# Patient Record
Sex: Female | Born: 1935
Health system: Southern US, Community
[De-identification: ages and names within clinical notes are randomized; demographics above are authoritative.]

## PROBLEM LIST (undated history)

## (undated) DIAGNOSIS — I480 Paroxysmal atrial fibrillation: Secondary | ICD-10-CM

## (undated) DIAGNOSIS — E876 Hypokalemia: Secondary | ICD-10-CM

## (undated) DIAGNOSIS — Z5111 Encounter for antineoplastic chemotherapy: Secondary | ICD-10-CM

## (undated) DIAGNOSIS — I119 Hypertensive heart disease without heart failure: Secondary | ICD-10-CM

## (undated) DIAGNOSIS — Z7189 Other specified counseling: Secondary | ICD-10-CM

## (undated) DIAGNOSIS — M1990 Unspecified osteoarthritis, unspecified site: Secondary | ICD-10-CM

## (undated) DIAGNOSIS — Z950 Presence of cardiac pacemaker: Secondary | ICD-10-CM

## (undated) DIAGNOSIS — Z923 Personal history of irradiation: Secondary | ICD-10-CM

## (undated) DIAGNOSIS — E039 Hypothyroidism, unspecified: Secondary | ICD-10-CM

## (undated) DIAGNOSIS — C3491 Malignant neoplasm of unspecified part of right bronchus or lung: Secondary | ICD-10-CM

## (undated) DIAGNOSIS — E860 Dehydration: Secondary | ICD-10-CM

## (undated) DIAGNOSIS — I441 Atrioventricular block, second degree: Secondary | ICD-10-CM

## (undated) DIAGNOSIS — I2510 Atherosclerotic heart disease of native coronary artery without angina pectoris: Secondary | ICD-10-CM

## (undated) DIAGNOSIS — Z853 Personal history of malignant neoplasm of breast: Secondary | ICD-10-CM

## (undated) DIAGNOSIS — I700 Atherosclerosis of aorta: Secondary | ICD-10-CM

## (undated) DIAGNOSIS — J449 Chronic obstructive pulmonary disease, unspecified: Secondary | ICD-10-CM

## (undated) DIAGNOSIS — I5032 Chronic diastolic (congestive) heart failure: Secondary | ICD-10-CM

## (undated) DIAGNOSIS — Z9221 Personal history of antineoplastic chemotherapy: Secondary | ICD-10-CM

## (undated) DIAGNOSIS — I340 Nonrheumatic mitral (valve) insufficiency: Secondary | ICD-10-CM

## (undated) HISTORY — PX: BREAST LUMPECTOMY: SHX2

## (undated) HISTORY — DX: Paroxysmal atrial fibrillation: I48.0

## (undated) HISTORY — PX: CARPAL TUNNEL RELEASE: SHX101

## (undated) HISTORY — DX: Hypokalemia: E87.6

## (undated) HISTORY — PX: TONSILLECTOMY AND ADENOIDECTOMY: SUR1326

## (undated) HISTORY — DX: Nonrheumatic mitral (valve) insufficiency: I34.0

## (undated) HISTORY — DX: Encounter for antineoplastic chemotherapy: Z51.11

## (undated) HISTORY — DX: Atherosclerotic heart disease of native coronary artery without angina pectoris: I25.10

## (undated) HISTORY — DX: Hypertensive heart disease without heart failure: I11.9

## (undated) HISTORY — PX: CHOLECYSTECTOMY: SHX55

## (undated) HISTORY — DX: Malignant neoplasm of unspecified part of right bronchus or lung: C34.91

## (undated) HISTORY — PX: ABDOMINAL HYSTERECTOMY: SHX81

## (undated) HISTORY — PX: FINGER SURGERY: SHX640

## (undated) HISTORY — DX: Dehydration: E86.0

## (undated) HISTORY — DX: Atherosclerosis of aorta: I70.0

## (undated) HISTORY — PX: SHOULDER SURGERY: SHX246

## (undated) HISTORY — DX: Other specified counseling: Z71.89

## (undated) HISTORY — DX: Chronic diastolic (congestive) heart failure: I50.32

## (undated) HISTORY — PX: BLADDER SUSPENSION: SHX72

---

## 1998-08-28 ENCOUNTER — Other Ambulatory Visit: Admission: RE | Admit: 1998-08-28 | Discharge: 1998-08-28 | Payer: Self-pay | Admitting: Obstetrics and Gynecology

## 1999-09-07 ENCOUNTER — Other Ambulatory Visit: Admission: RE | Admit: 1999-09-07 | Discharge: 1999-09-07 | Payer: Self-pay | Admitting: Obstetrics and Gynecology

## 2000-08-18 ENCOUNTER — Encounter: Admission: RE | Admit: 2000-08-18 | Discharge: 2000-08-18 | Payer: Self-pay | Admitting: Obstetrics and Gynecology

## 2000-08-18 ENCOUNTER — Encounter: Payer: Self-pay | Admitting: Obstetrics and Gynecology

## 2000-09-15 ENCOUNTER — Other Ambulatory Visit: Admission: RE | Admit: 2000-09-15 | Discharge: 2000-09-15 | Payer: Self-pay | Admitting: Obstetrics and Gynecology

## 2001-01-13 ENCOUNTER — Ambulatory Visit (HOSPITAL_BASED_OUTPATIENT_CLINIC_OR_DEPARTMENT_OTHER): Admission: RE | Admit: 2001-01-13 | Discharge: 2001-01-14 | Payer: Self-pay | Admitting: Orthopedic Surgery

## 2001-08-19 ENCOUNTER — Encounter: Payer: Self-pay | Admitting: Obstetrics and Gynecology

## 2001-08-19 ENCOUNTER — Encounter: Admission: RE | Admit: 2001-08-19 | Discharge: 2001-08-19 | Payer: Self-pay | Admitting: Obstetrics and Gynecology

## 2001-08-27 ENCOUNTER — Encounter: Payer: Self-pay | Admitting: Obstetrics and Gynecology

## 2001-08-27 ENCOUNTER — Encounter: Admission: RE | Admit: 2001-08-27 | Discharge: 2001-08-27 | Payer: Self-pay | Admitting: Internal Medicine

## 2001-09-07 ENCOUNTER — Encounter: Admission: RE | Admit: 2001-09-07 | Discharge: 2001-09-07 | Payer: Self-pay | Admitting: *Deleted

## 2001-09-07 ENCOUNTER — Encounter: Payer: Self-pay | Admitting: *Deleted

## 2001-09-09 ENCOUNTER — Encounter (INDEPENDENT_AMBULATORY_CARE_PROVIDER_SITE_OTHER): Payer: Self-pay | Admitting: Specialist

## 2001-09-09 ENCOUNTER — Ambulatory Visit (HOSPITAL_BASED_OUTPATIENT_CLINIC_OR_DEPARTMENT_OTHER): Admission: RE | Admit: 2001-09-09 | Discharge: 2001-09-09 | Payer: Self-pay | Admitting: *Deleted

## 2001-09-09 ENCOUNTER — Encounter: Admission: RE | Admit: 2001-09-09 | Discharge: 2001-09-09 | Payer: Self-pay | Admitting: Obstetrics and Gynecology

## 2001-09-09 ENCOUNTER — Encounter: Payer: Self-pay | Admitting: *Deleted

## 2001-09-16 ENCOUNTER — Other Ambulatory Visit: Admission: RE | Admit: 2001-09-16 | Discharge: 2001-09-16 | Payer: Self-pay | Admitting: Obstetrics and Gynecology

## 2001-09-18 ENCOUNTER — Ambulatory Visit: Admission: RE | Admit: 2001-09-18 | Discharge: 2001-12-17 | Payer: Self-pay | Admitting: Radiation Oncology

## 2001-10-13 ENCOUNTER — Encounter: Payer: Self-pay | Admitting: *Deleted

## 2001-10-13 ENCOUNTER — Ambulatory Visit (HOSPITAL_BASED_OUTPATIENT_CLINIC_OR_DEPARTMENT_OTHER): Admission: RE | Admit: 2001-10-13 | Discharge: 2001-10-13 | Payer: Self-pay | Admitting: *Deleted

## 2001-10-27 ENCOUNTER — Ambulatory Visit (HOSPITAL_COMMUNITY): Admission: RE | Admit: 2001-10-27 | Discharge: 2001-10-27 | Payer: Self-pay | Admitting: Oncology

## 2001-11-04 ENCOUNTER — Encounter (HOSPITAL_COMMUNITY): Payer: Self-pay | Admitting: Oncology

## 2001-11-04 ENCOUNTER — Ambulatory Visit (HOSPITAL_COMMUNITY): Admission: RE | Admit: 2001-11-04 | Discharge: 2001-11-04 | Payer: Self-pay | Admitting: Oncology

## 2002-02-15 ENCOUNTER — Ambulatory Visit: Admission: RE | Admit: 2002-02-15 | Discharge: 2002-05-16 | Payer: Self-pay | Admitting: Radiation Oncology

## 2002-04-26 ENCOUNTER — Ambulatory Visit (HOSPITAL_BASED_OUTPATIENT_CLINIC_OR_DEPARTMENT_OTHER): Admission: RE | Admit: 2002-04-26 | Discharge: 2002-04-26 | Payer: Self-pay | Admitting: *Deleted

## 2002-04-30 ENCOUNTER — Encounter (HOSPITAL_COMMUNITY): Payer: Self-pay | Admitting: Oncology

## 2002-04-30 ENCOUNTER — Encounter: Admission: RE | Admit: 2002-04-30 | Discharge: 2002-04-30 | Payer: Self-pay | Admitting: Oncology

## 2002-09-07 ENCOUNTER — Ambulatory Visit (HOSPITAL_COMMUNITY): Admission: RE | Admit: 2002-09-07 | Discharge: 2002-09-07 | Payer: Self-pay | Admitting: Oncology

## 2002-09-07 ENCOUNTER — Encounter (HOSPITAL_COMMUNITY): Payer: Self-pay | Admitting: Oncology

## 2002-09-13 ENCOUNTER — Encounter: Payer: Self-pay | Admitting: *Deleted

## 2002-09-13 ENCOUNTER — Encounter: Admission: RE | Admit: 2002-09-13 | Discharge: 2002-09-13 | Payer: Self-pay | Admitting: *Deleted

## 2002-10-19 ENCOUNTER — Ambulatory Visit (HOSPITAL_BASED_OUTPATIENT_CLINIC_OR_DEPARTMENT_OTHER): Admission: RE | Admit: 2002-10-19 | Discharge: 2002-10-19 | Payer: Self-pay | Admitting: Orthopedic Surgery

## 2002-12-21 ENCOUNTER — Ambulatory Visit (HOSPITAL_BASED_OUTPATIENT_CLINIC_OR_DEPARTMENT_OTHER): Admission: RE | Admit: 2002-12-21 | Discharge: 2002-12-21 | Payer: Self-pay | Admitting: Orthopedic Surgery

## 2003-03-22 ENCOUNTER — Encounter (INDEPENDENT_AMBULATORY_CARE_PROVIDER_SITE_OTHER): Payer: Self-pay | Admitting: *Deleted

## 2003-03-22 ENCOUNTER — Ambulatory Visit (HOSPITAL_COMMUNITY): Admission: RE | Admit: 2003-03-22 | Discharge: 2003-03-22 | Payer: Self-pay | Admitting: Gastroenterology

## 2003-08-09 ENCOUNTER — Encounter (HOSPITAL_COMMUNITY): Payer: Self-pay | Admitting: Oncology

## 2003-09-15 ENCOUNTER — Encounter: Admission: RE | Admit: 2003-09-15 | Discharge: 2003-09-15 | Payer: Self-pay | Admitting: Oncology

## 2003-09-15 ENCOUNTER — Encounter (HOSPITAL_COMMUNITY): Payer: Self-pay | Admitting: Oncology

## 2004-02-13 ENCOUNTER — Encounter: Admission: RE | Admit: 2004-02-13 | Discharge: 2004-02-13 | Payer: Self-pay | Admitting: Oncology

## 2004-05-01 ENCOUNTER — Ambulatory Visit (HOSPITAL_COMMUNITY): Admission: RE | Admit: 2004-05-01 | Discharge: 2004-05-01 | Payer: Self-pay | Admitting: Oncology

## 2004-05-27 ENCOUNTER — Ambulatory Visit (HOSPITAL_COMMUNITY): Admission: RE | Admit: 2004-05-27 | Discharge: 2004-05-27 | Payer: Self-pay | Admitting: Oncology

## 2004-07-02 ENCOUNTER — Ambulatory Visit (HOSPITAL_COMMUNITY): Admission: RE | Admit: 2004-07-02 | Discharge: 2004-07-02 | Payer: Self-pay | Admitting: Oncology

## 2004-09-17 ENCOUNTER — Encounter: Admission: RE | Admit: 2004-09-17 | Discharge: 2004-09-17 | Payer: Self-pay | Admitting: Obstetrics and Gynecology

## 2005-01-28 ENCOUNTER — Ambulatory Visit: Payer: Self-pay | Admitting: Oncology

## 2005-05-15 ENCOUNTER — Ambulatory Visit (HOSPITAL_COMMUNITY): Admission: RE | Admit: 2005-05-15 | Discharge: 2005-05-15 | Payer: Self-pay | Admitting: Oncology

## 2005-05-24 ENCOUNTER — Ambulatory Visit: Payer: Self-pay | Admitting: Oncology

## 2005-05-28 ENCOUNTER — Ambulatory Visit (HOSPITAL_COMMUNITY): Admission: RE | Admit: 2005-05-28 | Discharge: 2005-05-28 | Payer: Self-pay | Admitting: Oncology

## 2005-09-12 ENCOUNTER — Ambulatory Visit: Payer: Self-pay | Admitting: Oncology

## 2005-09-18 ENCOUNTER — Encounter: Admission: RE | Admit: 2005-09-18 | Discharge: 2005-09-18 | Payer: Self-pay | Admitting: Obstetrics and Gynecology

## 2006-01-09 ENCOUNTER — Ambulatory Visit: Payer: Self-pay | Admitting: Oncology

## 2006-05-13 ENCOUNTER — Ambulatory Visit: Payer: Self-pay | Admitting: Oncology

## 2006-05-15 LAB — COMPREHENSIVE METABOLIC PANEL
Alkaline Phosphatase: 77 U/L (ref 39–117)
BUN: 15 mg/dL (ref 6–23)
Creatinine, Ser: 0.9 mg/dL (ref 0.40–1.20)
Glucose, Bld: 126 mg/dL — ABNORMAL HIGH (ref 70–99)
Total Bilirubin: 0.4 mg/dL (ref 0.3–1.2)

## 2006-05-15 LAB — CBC WITH DIFFERENTIAL/PLATELET
Basophils Absolute: 0.1 10*3/uL (ref 0.0–0.1)
Eosinophils Absolute: 0.2 10*3/uL (ref 0.0–0.5)
HCT: 40.6 % (ref 34.8–46.6)
HGB: 14.3 g/dL (ref 11.6–15.9)
LYMPH%: 31.3 % (ref 14.0–48.0)
MCV: 93.4 fL (ref 81.0–101.0)
MONO%: 10.1 % (ref 0.0–13.0)
NEUT#: 3.4 10*3/uL (ref 1.5–6.5)
NEUT%: 54.1 % (ref 39.6–76.8)
Platelets: 197 10*3/uL (ref 145–400)
RBC: 4.35 10*6/uL (ref 3.70–5.32)

## 2006-05-16 ENCOUNTER — Ambulatory Visit (HOSPITAL_COMMUNITY): Admission: RE | Admit: 2006-05-16 | Discharge: 2006-05-16 | Payer: Self-pay | Admitting: Oncology

## 2006-05-21 ENCOUNTER — Encounter: Admission: RE | Admit: 2006-05-21 | Discharge: 2006-05-21 | Payer: Self-pay | Admitting: Oncology

## 2006-09-09 ENCOUNTER — Ambulatory Visit: Payer: Self-pay | Admitting: Oncology

## 2006-09-22 ENCOUNTER — Encounter: Admission: RE | Admit: 2006-09-22 | Discharge: 2006-09-22 | Payer: Self-pay | Admitting: Obstetrics and Gynecology

## 2007-01-12 ENCOUNTER — Ambulatory Visit: Payer: Self-pay | Admitting: Oncology

## 2007-01-15 LAB — CBC WITH DIFFERENTIAL/PLATELET
BASO%: 0.4 % (ref 0.0–2.0)
EOS%: 2.9 % (ref 0.0–7.0)
HCT: 38 % (ref 34.8–46.6)
LYMPH%: 23.8 % (ref 14.0–48.0)
MCH: 33 pg (ref 26.0–34.0)
MCHC: 35.5 g/dL (ref 32.0–36.0)
MCV: 92.8 fL (ref 81.0–101.0)
MONO%: 9.7 % (ref 0.0–13.0)
NEUT%: 63.2 % (ref 39.6–76.8)
Platelets: 158 10*3/uL (ref 145–400)
RBC: 4.1 10*6/uL (ref 3.70–5.32)
WBC: 7.8 10*3/uL (ref 3.9–10.0)

## 2007-01-15 LAB — COMPREHENSIVE METABOLIC PANEL
ALT: 12 U/L (ref 0–35)
Alkaline Phosphatase: 82 U/L (ref 39–117)
CO2: 26 mEq/L (ref 19–32)
Creatinine, Ser: 0.91 mg/dL (ref 0.40–1.20)
Sodium: 140 mEq/L (ref 135–145)
Total Bilirubin: 0.7 mg/dL (ref 0.3–1.2)
Total Protein: 6.9 g/dL (ref 6.0–8.3)

## 2007-01-15 LAB — LACTATE DEHYDROGENASE: LDH: 154 U/L (ref 94–250)

## 2007-01-29 LAB — BASIC METABOLIC PANEL
CO2: 27 mEq/L (ref 19–32)
Calcium: 9.8 mg/dL (ref 8.4–10.5)
Chloride: 103 mEq/L (ref 96–112)
Creatinine, Ser: 0.99 mg/dL (ref 0.40–1.20)
Glucose, Bld: 99 mg/dL (ref 70–99)

## 2007-05-12 ENCOUNTER — Ambulatory Visit: Payer: Self-pay | Admitting: Oncology

## 2007-05-14 ENCOUNTER — Ambulatory Visit (HOSPITAL_COMMUNITY): Admission: RE | Admit: 2007-05-14 | Discharge: 2007-05-14 | Payer: Self-pay | Admitting: Oncology

## 2007-05-14 LAB — COMPREHENSIVE METABOLIC PANEL
ALT: 15 U/L (ref 0–35)
AST: 14 U/L (ref 0–37)
Albumin: 4.2 g/dL (ref 3.5–5.2)
Alkaline Phosphatase: 83 U/L (ref 39–117)
Glucose, Bld: 168 mg/dL — ABNORMAL HIGH (ref 70–99)
Potassium: 3.8 mEq/L (ref 3.5–5.3)
Sodium: 139 mEq/L (ref 135–145)
Total Bilirubin: 0.4 mg/dL (ref 0.3–1.2)
Total Protein: 7.1 g/dL (ref 6.0–8.3)

## 2007-05-14 LAB — CBC WITH DIFFERENTIAL/PLATELET
BASO%: 0.3 % (ref 0.0–2.0)
Basophils Absolute: 0 10*3/uL (ref 0.0–0.1)
EOS%: 4.3 % (ref 0.0–7.0)
HCT: 38 % (ref 34.8–46.6)
HGB: 13.7 g/dL (ref 11.6–15.9)
MCH: 33.3 pg (ref 26.0–34.0)
MCHC: 36 g/dL (ref 32.0–36.0)
MCV: 92.8 fL (ref 81.0–101.0)
MONO%: 9.3 % (ref 0.0–13.0)
NEUT%: 53 % (ref 39.6–76.8)
RDW: 14.5 % (ref 11.3–14.5)
lymph#: 1.7 10*3/uL (ref 0.9–3.3)

## 2007-09-07 ENCOUNTER — Ambulatory Visit: Payer: Self-pay | Admitting: Oncology

## 2007-09-10 LAB — COMPREHENSIVE METABOLIC PANEL
ALT: 15 U/L (ref 0–35)
Alkaline Phosphatase: 87 U/L (ref 39–117)
CO2: 25 mEq/L (ref 19–32)
Creatinine, Ser: 0.99 mg/dL (ref 0.40–1.20)
Sodium: 140 mEq/L (ref 135–145)
Total Bilirubin: 0.5 mg/dL (ref 0.3–1.2)

## 2007-09-10 LAB — CBC WITH DIFFERENTIAL/PLATELET
BASO%: 0.8 % (ref 0.0–2.0)
EOS%: 4.4 % (ref 0.0–7.0)
HCT: 39.2 % (ref 34.8–46.6)
LYMPH%: 35.6 % (ref 14.0–48.0)
MCH: 33.5 pg (ref 26.0–34.0)
MCHC: 36.2 g/dL — ABNORMAL HIGH (ref 32.0–36.0)
MCV: 92.6 fL (ref 81.0–101.0)
MONO#: 0.6 10*3/uL (ref 0.1–0.9)
MONO%: 11.3 % (ref 0.0–13.0)
NEUT%: 47.9 % (ref 39.6–76.8)
Platelets: 168 10*3/uL (ref 145–400)
RBC: 4.23 10*6/uL (ref 3.70–5.32)

## 2007-09-10 LAB — LACTATE DEHYDROGENASE: LDH: 167 U/L (ref 94–250)

## 2007-09-28 ENCOUNTER — Encounter: Admission: RE | Admit: 2007-09-28 | Discharge: 2007-09-28 | Payer: Self-pay | Admitting: Obstetrics and Gynecology

## 2007-10-01 ENCOUNTER — Encounter: Admission: RE | Admit: 2007-10-01 | Discharge: 2007-10-01 | Payer: Self-pay | Admitting: Oncology

## 2008-01-07 ENCOUNTER — Ambulatory Visit: Payer: Self-pay | Admitting: Oncology

## 2008-01-11 LAB — CBC WITH DIFFERENTIAL/PLATELET
EOS%: 2.9 % (ref 0.0–7.0)
Eosinophils Absolute: 0.2 10*3/uL (ref 0.0–0.5)
HCT: 40.4 % (ref 34.8–46.6)
HGB: 14 g/dL (ref 11.6–15.9)
MCHC: 34.7 g/dL (ref 32.0–36.0)
MONO#: 0.5 10*3/uL (ref 0.1–0.9)
MONO%: 7.8 % (ref 0.0–13.0)
NEUT#: 3.7 10*3/uL (ref 1.5–6.5)
NEUT%: 60.1 % (ref 39.6–76.8)
Platelets: 168 10*3/uL (ref 145–400)
WBC: 6.1 10*3/uL (ref 3.9–10.0)
lymph#: 1.7 10*3/uL (ref 0.9–3.3)

## 2008-01-11 LAB — COMPREHENSIVE METABOLIC PANEL
AST: 15 U/L (ref 0–37)
Albumin: 4.4 g/dL (ref 3.5–5.2)
Alkaline Phosphatase: 89 U/L (ref 39–117)
BUN: 17 mg/dL (ref 6–23)
Potassium: 4 mEq/L (ref 3.5–5.3)
Sodium: 141 mEq/L (ref 135–145)
Total Bilirubin: 0.4 mg/dL (ref 0.3–1.2)

## 2008-07-15 ENCOUNTER — Ambulatory Visit: Payer: Self-pay | Admitting: Oncology

## 2008-07-19 LAB — CBC WITH DIFFERENTIAL/PLATELET
Basophils Absolute: 0 10*3/uL (ref 0.0–0.1)
Eosinophils Absolute: 0.2 10*3/uL (ref 0.0–0.5)
HCT: 41 % (ref 34.8–46.6)
LYMPH%: 33.8 % (ref 14.0–48.0)
MCV: 96.5 fL (ref 81.0–101.0)
MONO#: 0.6 10*3/uL (ref 0.1–0.9)
MONO%: 10.8 % (ref 0.0–13.0)
NEUT#: 2.6 10*3/uL (ref 1.5–6.5)
NEUT%: 50.9 % (ref 39.6–76.8)
Platelets: 167 10*3/uL (ref 145–400)
RBC: 4.26 10*6/uL (ref 3.70–5.32)
WBC: 5.2 10*3/uL (ref 3.9–10.0)

## 2008-07-19 LAB — COMPREHENSIVE METABOLIC PANEL
Alkaline Phosphatase: 75 U/L (ref 39–117)
BUN: 19 mg/dL (ref 6–23)
CO2: 25 mEq/L (ref 19–32)
Creatinine, Ser: 0.97 mg/dL (ref 0.40–1.20)
Glucose, Bld: 124 mg/dL — ABNORMAL HIGH (ref 70–99)
Sodium: 139 mEq/L (ref 135–145)
Total Bilirubin: 0.4 mg/dL (ref 0.3–1.2)
Total Protein: 7.4 g/dL (ref 6.0–8.3)

## 2008-07-19 LAB — LACTATE DEHYDROGENASE: LDH: 155 U/L (ref 94–250)

## 2008-07-25 ENCOUNTER — Encounter: Admission: RE | Admit: 2008-07-25 | Discharge: 2008-07-25 | Payer: Self-pay | Admitting: Oncology

## 2008-07-25 ENCOUNTER — Ambulatory Visit (HOSPITAL_COMMUNITY): Admission: RE | Admit: 2008-07-25 | Discharge: 2008-07-25 | Payer: Self-pay | Admitting: Oncology

## 2008-10-03 ENCOUNTER — Encounter: Admission: RE | Admit: 2008-10-03 | Discharge: 2008-10-03 | Payer: Self-pay | Admitting: Obstetrics and Gynecology

## 2009-01-18 ENCOUNTER — Ambulatory Visit: Payer: Self-pay | Admitting: Oncology

## 2009-01-20 LAB — COMPREHENSIVE METABOLIC PANEL
AST: 11 U/L (ref 0–37)
Albumin: 4.2 g/dL (ref 3.5–5.2)
Alkaline Phosphatase: 70 U/L (ref 39–117)
BUN: 20 mg/dL (ref 6–23)
Potassium: 4.2 mEq/L (ref 3.5–5.3)
Sodium: 140 mEq/L (ref 135–145)
Total Bilirubin: 0.4 mg/dL (ref 0.3–1.2)

## 2009-01-20 LAB — CBC WITH DIFFERENTIAL/PLATELET
EOS%: 2.3 % (ref 0.0–7.0)
LYMPH%: 23.8 % (ref 14.0–48.0)
MCH: 32.9 pg (ref 26.0–34.0)
MCV: 95.6 fL (ref 81.0–101.0)
MONO%: 9.6 % (ref 0.0–13.0)
Platelets: 259 10*3/uL (ref 145–400)
RBC: 3.98 10*6/uL (ref 3.70–5.32)
RDW: 14.2 % (ref 11.3–14.5)

## 2009-01-30 ENCOUNTER — Ambulatory Visit (HOSPITAL_COMMUNITY): Admission: RE | Admit: 2009-01-30 | Discharge: 2009-01-30 | Payer: Self-pay | Admitting: Oncology

## 2009-02-03 ENCOUNTER — Ambulatory Visit (HOSPITAL_COMMUNITY): Admission: RE | Admit: 2009-02-03 | Discharge: 2009-02-03 | Payer: Self-pay | Admitting: Oncology

## 2009-08-08 ENCOUNTER — Ambulatory Visit: Payer: Self-pay | Admitting: Oncology

## 2009-08-10 LAB — LACTATE DEHYDROGENASE: LDH: 152 U/L (ref 94–250)

## 2009-08-10 LAB — COMPREHENSIVE METABOLIC PANEL
ALT: 20 U/L (ref 0–35)
AST: 14 U/L (ref 0–37)
Albumin: 4.2 g/dL (ref 3.5–5.2)
Alkaline Phosphatase: 58 U/L (ref 39–117)
Potassium: 3.9 mEq/L (ref 3.5–5.3)
Sodium: 139 mEq/L (ref 135–145)
Total Protein: 6.9 g/dL (ref 6.0–8.3)

## 2009-08-10 LAB — CBC WITH DIFFERENTIAL/PLATELET
BASO%: 0.6 % (ref 0.0–2.0)
Basophils Absolute: 0 10*3/uL (ref 0.0–0.1)
EOS%: 4.5 % (ref 0.0–7.0)
HGB: 12.8 g/dL (ref 11.6–15.9)
MCH: 32.1 pg (ref 25.1–34.0)
MCHC: 34.3 g/dL (ref 31.5–36.0)
RBC: 4 10*6/uL (ref 3.70–5.45)
RDW: 14 % (ref 11.2–14.5)
lymph#: 1.7 10*3/uL (ref 0.9–3.3)

## 2009-10-04 ENCOUNTER — Encounter: Admission: RE | Admit: 2009-10-04 | Discharge: 2009-10-04 | Payer: Self-pay | Admitting: Obstetrics and Gynecology

## 2010-02-05 ENCOUNTER — Ambulatory Visit: Payer: Self-pay | Admitting: Oncology

## 2010-02-08 LAB — CBC WITH DIFFERENTIAL/PLATELET
Eosinophils Absolute: 0.1 10*3/uL (ref 0.0–0.5)
HCT: 39.3 % (ref 34.8–46.6)
LYMPH%: 29.1 % (ref 14.0–49.7)
MCHC: 34.9 g/dL (ref 31.5–36.0)
MONO#: 0.5 10*3/uL (ref 0.1–0.9)
NEUT#: 3.5 10*3/uL (ref 1.5–6.5)
NEUT%: 59.4 % (ref 38.4–76.8)
Platelets: 193 10*3/uL (ref 145–400)
WBC: 5.9 10*3/uL (ref 3.9–10.3)

## 2010-02-08 LAB — COMPREHENSIVE METABOLIC PANEL
CO2: 17 mEq/L — ABNORMAL LOW (ref 19–32)
Creatinine, Ser: 0.99 mg/dL (ref 0.40–1.20)
Glucose, Bld: 125 mg/dL — ABNORMAL HIGH (ref 70–99)
Total Bilirubin: 0.4 mg/dL (ref 0.3–1.2)

## 2010-02-08 LAB — LACTATE DEHYDROGENASE: LDH: 162 U/L (ref 94–250)

## 2010-02-26 ENCOUNTER — Ambulatory Visit (HOSPITAL_COMMUNITY): Admission: RE | Admit: 2010-02-26 | Discharge: 2010-02-26 | Payer: Self-pay | Admitting: Oncology

## 2010-07-26 ENCOUNTER — Encounter: Admission: RE | Admit: 2010-07-26 | Discharge: 2010-07-26 | Payer: Self-pay | Admitting: Internal Medicine

## 2010-08-02 ENCOUNTER — Ambulatory Visit: Payer: Self-pay | Admitting: Oncology

## 2010-08-06 LAB — COMPREHENSIVE METABOLIC PANEL
ALT: 16 U/L (ref 0–35)
Alkaline Phosphatase: 60 U/L (ref 39–117)
CO2: 25 mEq/L (ref 19–32)
Creatinine, Ser: 1.03 mg/dL (ref 0.40–1.20)
Total Bilirubin: 0.5 mg/dL (ref 0.3–1.2)

## 2010-08-06 LAB — CBC WITH DIFFERENTIAL/PLATELET
EOS%: 3.6 % (ref 0.0–7.0)
Eosinophils Absolute: 0.2 10*3/uL (ref 0.0–0.5)
LYMPH%: 27.2 % (ref 14.0–49.7)
MCH: 33.2 pg (ref 25.1–34.0)
MCHC: 34.2 g/dL (ref 31.5–36.0)
MCV: 97.2 fL (ref 79.5–101.0)
MONO%: 9.1 % (ref 0.0–14.0)
NEUT#: 3.4 10*3/uL (ref 1.5–6.5)
Platelets: 194 10*3/uL (ref 145–400)
RBC: 4.29 10*6/uL (ref 3.70–5.45)

## 2010-08-06 LAB — LACTATE DEHYDROGENASE: LDH: 168 U/L (ref 94–250)

## 2010-09-03 ENCOUNTER — Ambulatory Visit: Payer: Self-pay | Admitting: Oncology

## 2010-10-05 ENCOUNTER — Encounter: Admission: RE | Admit: 2010-10-05 | Discharge: 2010-10-05 | Payer: Self-pay | Admitting: Oncology

## 2011-02-05 ENCOUNTER — Other Ambulatory Visit (HOSPITAL_COMMUNITY): Payer: Self-pay | Admitting: Oncology

## 2011-02-05 ENCOUNTER — Ambulatory Visit (HOSPITAL_COMMUNITY)
Admission: RE | Admit: 2011-02-05 | Discharge: 2011-02-05 | Disposition: A | Discharge status: Home / Self Care | Payer: Medicare Other | Source: Ambulatory Visit | Attending: Oncology | Admitting: Oncology

## 2011-02-05 ENCOUNTER — Encounter (HOSPITAL_BASED_OUTPATIENT_CLINIC_OR_DEPARTMENT_OTHER): Payer: Medicare Other | Admitting: Oncology

## 2011-02-05 DIAGNOSIS — C50319 Malignant neoplasm of lower-inner quadrant of unspecified female breast: Secondary | ICD-10-CM

## 2011-02-05 DIAGNOSIS — Z09 Encounter for follow-up examination after completed treatment for conditions other than malignant neoplasm: Secondary | ICD-10-CM | POA: Insufficient documentation

## 2011-02-05 DIAGNOSIS — R221 Localized swelling, mass and lump, neck: Secondary | ICD-10-CM

## 2011-02-05 DIAGNOSIS — R220 Localized swelling, mass and lump, head: Secondary | ICD-10-CM

## 2011-02-05 DIAGNOSIS — C50919 Malignant neoplasm of unspecified site of unspecified female breast: Secondary | ICD-10-CM

## 2011-02-05 DIAGNOSIS — Z780 Asymptomatic menopausal state: Secondary | ICD-10-CM

## 2011-02-05 DIAGNOSIS — Z853 Personal history of malignant neoplasm of breast: Secondary | ICD-10-CM

## 2011-02-05 LAB — CBC WITH DIFFERENTIAL/PLATELET
BASO%: 0.5 % (ref 0.0–2.0)
Basophils Absolute: 0 10*3/uL (ref 0.0–0.1)
HCT: 40.1 % (ref 34.8–46.6)
HGB: 13.9 g/dL (ref 11.6–15.9)
MCHC: 34.7 g/dL (ref 31.5–36.0)
MONO#: 0.6 10*3/uL (ref 0.1–0.9)
NEUT%: 53.4 % (ref 38.4–76.8)
WBC: 5.6 10*3/uL (ref 3.9–10.3)
lymph#: 1.7 10*3/uL (ref 0.9–3.3)

## 2011-02-05 LAB — COMPREHENSIVE METABOLIC PANEL
ALT: 10 U/L (ref 0–35)
CO2: 27 mEq/L (ref 19–32)
Calcium: 9.4 mg/dL (ref 8.4–10.5)
Chloride: 102 mEq/L (ref 96–112)
Creatinine, Ser: 1.05 mg/dL (ref 0.40–1.20)

## 2011-02-05 LAB — LACTATE DEHYDROGENASE: LDH: 132 U/L (ref 94–250)

## 2011-02-07 ENCOUNTER — Other Ambulatory Visit: Payer: Medicare Other

## 2011-02-13 ENCOUNTER — Ambulatory Visit
Admission: RE | Admit: 2011-02-13 | Discharge: 2011-02-13 | Disposition: A | Discharge status: Home / Self Care | Payer: Medicare Other | Source: Ambulatory Visit | Attending: Oncology | Admitting: Oncology

## 2011-02-13 DIAGNOSIS — Z780 Asymptomatic menopausal state: Secondary | ICD-10-CM

## 2011-02-13 DIAGNOSIS — Z853 Personal history of malignant neoplasm of breast: Secondary | ICD-10-CM

## 2011-04-26 NOTE — Op Note (Signed)
[place]. [facility]  Patient:    NECOLE, MINASSIAN Visit Number: [id number] MRN: [id number]          Service Type: DSU Location: [facility] Attending Physician:  Kandis Mannan Dictated by:   Donnie Coffin Samuella Cota, M.D. Proc. Date: 09/09/01 Admit Date:  09/09/2001   CC:         Malachi Pro. Ambrose Mantle, M.D.  Genene Churn. Sherin Quarry, M.D.   Operative Report  CCS [phone].  PREOPERATIVE DIAGNOSIS:  Biopsy-proven invasive ductal carcinoma, left breast.  POSTOPERATIVE DIAGNOSIS:  Biopsy-proven invasive ductal carcinoma, left breast, with positive axillary lymph node showing metastatic carcinoma.  PROCEDURES: 1. Needle localization and partial mastectomy of left breast. 2. Injection of blue dye. 3. Sentinel lymph node dissection, left axilla. 4. Left axillary lymph node dissection.  SURGEON:  Maisie Fus B. Samuella Cota, M.D.  ANESTHESIA:  General, anesthesiologist and C.R.N.A.  DESCRIPTION OF PROCEDURE:  The patient was taken to the operating room and placed on the table in supine position.  After satisfactory general anesthetic with LMA intubation, the left breast and axilla were prepped and draped in a sterile field.  Isosulfan blue 4 cc was injected just beneath the skin of the nipple and areola.  This was then massaged for five minutes.  The gamma probe was placed over the left axilla, and there was a hot spot noted.  A small transverse incision was made in the lower axilla.  Dissection was taken through skin and subcutaneous tissue.  A slightly blue, hot node was identified, dissected free with lymphatic coming to it clipped with hemoclips. After this was removed, a second hot node was identified posterior to this, which was hotter than the first node and contained more blue dye.  This was treated in a similar manner and removed.  There was a third node in the same area, which was also dissected free and removed.  The three hot, blue nodes were sent to the pathologist.   Hemostasis was obtained, and the wound was packed open while we awaited further reports.  The patient had a wire coming through the skin at about the 7 oclock position and angling back toward the nipple and areola.  A transverse elliptical incision was made in the lower breast to remove the opening in the skin made by the wire.  The patient had a rather large breast and using the wire as a guide, a large amount of breast tissue was removed.  There seemed to be some firmness close to the incision cephalad, so more tissue was removed and all the tissue I cut across looked and felt normal.  The specimen was marked with a short suture medially and a long suture laterally.  This was then sent to the radiologist to be sure that the area of concern had been removed, and they reported that it had been removed.  At about this time, the pathologist reported that malignant cells were identified in at least one of the sentinel lymph nodes.  The patient then had a full axillary lymph node dissection.  The incision was extended at the two ends and taken down to the pectoralis major muscle.  The pectoralis major and minor muscles were retracted medially and the axilla dissected.  Vessels coming off inferior to the axillary vein were doubly clipped with hemoclips and divided.  The long thoracic nerve of Bell and thoracodorsal artery and vein and nerve were seen and preserved.  The patient had a generous amount of fatty tissue in the  axilla, and some hard nodes were felt.  After the axilla had been dissected and irrigated, hemostasis was obtained.  This was obtained using either hemoclips or suture ties or plain ties of 3-0 Vicryl.  A #19 round Blake drain was placed into the left axilla through a stab wound inferior to that.  It was sutured in place using a 3-0 Vicryl suture.  Both wounds were irrigated and closed with subcuticular sutures of 3-0 Vicryl and the skin was closed with skin staples.  The Blake  drain was connected to bulb suction.  Vaseline gauze was placed over the staples of the axilla.  A large dressing using 4 x 4s, ABD, and four-inch Hypafix was applied.  The patient seemed to tolerate the procedure well and was taken to the PACU in satisfactory condition. Dictated by:   Donnie Coffin Samuella Cota, M.D. Attending Physician:  Kandis Mannan DD:  09/09/01 TD:  09/10/01 Job: [id number]

## 2011-04-26 NOTE — Op Note (Signed)
[place]. [facility]  Patient:    Maria Washington, Maria Washington Visit Number: [id number] MRN: [id number]          Service Type: DSU Location: [facility] Attending Physician:  Kandis Mannan Dictated by:   Donnie Coffin Samuella Cota, M.D. Proc. Date: 10/13/01 Admit Date:  10/13/2001   CC:         Valentino Hue. Magrinat, M.D.  Genene Churn. Sherin Quarry, M.D.  Malachi Pro. Ambrose Mantle, M.D.   Operative Report  CCS [phone].  PREOPERATIVE DIAGNOSIS:  Carcinoma of the left breast, needs intravenous access for chemotherapy.  POSTOPERATIVE DIAGNOSIS:  Carcinoma of the left breast, needs intravenous access for chemotherapy.  PROCEDURE:  Insertion of a Lifeport catheter via right subclavian vein.  SURGEON:  Maisie Fus B. Samuella Cota, M.D.  ANESTHESIA:  1% Xylocaine local with anesthesia monitoring, anesthesiologist J. Claybon Jabs, M.D., and C.R.N.A. Palmer.  DESCRIPTION OF PROCEDURE:  The patient was taken to the operating room and placed on the table in the supine position.  A rolled-up sheet was placed vertically along the thoracic spine.  The neck and chest area was prepped on the left side.  The sterile drapes were applied.  The patient was placed in Trendelenburg position and 1% Xylocaine local was used to infiltrate the skin beneath the right clavicle.  The 18-gauge needle was used to enter the right subclavian vein.  The vein was entered on the first pass of the needle.  The guidewire was placed through the needle, and fluoroscopy revealed that the guidewire was going caudally.  The needle was removed.  The guidewire was left in place.  Xylocaine 1% was used to infiltrate the skin overlying the right chest.  A small transverse incision was made and a subcutaneous pocket was created inferior to this.  The Lifeport catheter was then threaded subcutaneously from the area of the reservoir to the area of the subclavian stick.  It was filled with heparinized saline.  The vein was then dilated using the  dilator, and then the dilator and peel-away sheath were placed over the guidewire and the dilator and guidewire were pulled away, leaving the peel-away sheath.  The Lifeport catheter was then threaded through the peel-away sheath and the peel-away sheath pulled away.  Using fluoroscopy, the tube was brought back up into the area of the superior vena cava.  The system aspirated and irrigated easily.  The catheter was then attached to the Lifeport reservoir using the bayonet attachment.  The reservoir was sutured in the subcutaneous pocket using three sutures of 3-0 Vicryl.  Fluoroscopy again was used to be sure that the system looked satisfactory with no kinks.  The system still aspirated and irrigated well.  The entire system was then filled with 5 cc of heparin 100 units/cc.  The wound was closed with several interrupted sutures of 3-0 Vicryl interrupted sutures, and the skin was closed with a running subcuticular suture of 4-0 Vicryl.  A single 4-0 Vicryl suture was placed at the subcuticular site.  Benzoin and half-inch Steri-Strips were then used to reinforce the skin closure.  Dry sterile dressing was applied. The patient seemed to tolerate the procedure well and was taken to the PACU in satisfactory condition. Dictated by:   Donnie Coffin Samuella Cota, M.D. Attending Physician:  Kandis Mannan DD:  10/13/01 TD:  10/14/01 Job: [id number]

## 2011-04-26 NOTE — Op Note (Signed)
NAME:  Maria Washington, Maria Washington                          ACCOUNT NO.:  [id number]   MEDICAL RECORD NO.:  [id number]                   PATIENT TYPE:  AMB   LOCATION:  DSC                                  FACILITY:  [facility]   PHYSICIAN:  Katy Fitch. Naaman Plummer., M.D.          DATE OF BIRTH:  [birth year]   DATE OF PROCEDURE:  DATE OF DISCHARGE:                                 OPERATIVE REPORT   PREOPERATIVE DIAGNOSIS:  Chronic stenosis and tenosynovitis, right index  finger at A1 pulley with development of mask consistent with ganglion cyst  on the ulnar aspect of A1 pulley.   POSTOPERATIVE DIAGNOSIS:  Chronic stenosis and tenosynovitis, right index  finger at A1 pulley with development of mask consistent with ganglion cyst  on the ulnar aspect of A1 pulley.   OPERATION:  1. Removal of right index finger, ganglion cyst at A1 pulley.  2. Release of right index finger, A1 pulley.   OPERATING SURGEON:  Dr. Josephine Igo.   ASSISTANT:  Robert Dasnoit, P.A.-C.   ANESTHESIA:  0.25% Marcaine and 2% Lidocaine metacarpal head level block,  right index finger.   INDICATIONS FOR PROCEDURE:  Maria Washington is a 77 year old woman with a  history of stenosis and tenosynovitis and a painful mass in her right palm.  Clinical examination revealed signs of chronic stenosis and tenosynovitis.   Due to a failure to respond to non-operative measures, she is brought to the  operating room at this time for release of her right index finger A1 pulley  and excision of her ganglion.   DESCRIPTION OF PROCEDURE:  Maria Washington was brought to the operating room  and placed in the supine position on the operating room table.  Due to a  mastectomy on the left, IV access was obtained at the right antecubital  fossa.   The right arm was prepped with Betadine soap and solution and sterilely  draped.  A pneumatic tourniquet was applied to the forearm.   The area of intended incision was infiltrated with 0.25% Marcaine and  2%  Lidocaine, obtaining a digital block.  When anesthesia was satisfactory,  procedure commenced with exsanguination of the right hand with an Esmarch  bandage and inflation of a sterile tourniquet to 260 mmHg.  A short, oblique  incision was fashioned in the line of a Brunner's zig zag incision directly  over the A1 pulley.  Subcutaneous tissues were carefully divided and gently  retracted in the neurovascular bundles.  The cyst measured approximately 5  mm in diameter.  This was removed off the A1 pulley with a rongeur.   The pulley was then split with scalpel and scissors.  Thereafter, flexor  tendons were delivered, full range of motion of the fingers recovered.  There were no apparent complications.  The wound was repaired with  interrupted sutures  of 5-0 nylon followed by placement of dressing of Xeroform, sterile gauze  and Coban.  For aftercare, [name] is given a prescription for Percocet 5  mg one p.o. q.4-6h. p.r.n. pain, she has medication at home.   She is given 500 mg of Vancomycin due to an abrasion over the dorsal aspect  of her hand as prophylactic antibiotic.                                               Katy Fitch Naaman Plummer., M.D.    RVS/MEDQ  D:  12/21/2002  T:  12/21/2002  Job:  [id number]

## 2011-04-26 NOTE — Op Note (Signed)
[place]. [facility]  Patient:    Maria Washington, Maria Washington                       MRN: [id number] Proc. Date: 01/13/01 Adm. Date:  [id number] Attending:  Twana First                           Operative Report  PREOPERATIVE DIAGNOSES>: 1. Right shoulder rotator cuff tear. 2. Right shoulder impingement. 3. Right shoulder acromioclavicular joint spurring/degenerative joint disease. 4. Right shoulder labrale tear.  POSTOPERATIVE DIAGNOSES: 1. Right shoulder rotator cuff tear. 2. Right shoulder impingement. 3. Right shoulder acromioclavicular joint spurring/degenerative joint disease. 4. Right shoulder labrale tear.  OPERATION: 1. Right shoulder examination under anesthesia followed by arthroscopic    partial labrale tear debridement. 2. Right shoulder subacromial decompression. 3. Right shoulder open rotator open cuff repair. 4. Right shoulder distal clavicle excision.  SURGEON:  Elana Alm. Thurston Hole, M.D.  ASSISTANT:  Kirstin A. Shepperson, P.A.  ANESTHESIA:  General  OPERATIVE TIME:  One hour.  COMPLICATIONS:  None.  INDICATION FOR PROCEDURE:  Maria Washington is a 77 year old woman who has had a year of right shoulder pain, increasing in nature with signs and signs, an MRI documenting rotator tear cuff and impingement with AC joint arthrosis and spurring and who has failed conservative care and is now to undergo arthroscopy and rotator cuff repair.  DESCRIPTION OF PROCEDURE:  Maria Washington is brought to the operating room on January 13, 2001 placed on the operative table in the supine position.  After and adequate level of general anesthesia was obtained, her right shoulder was examined under anesthesia.  She had full range of motion.  Her shoulder was stable ligamentous exam.  After this was done, she was placed in a beach chair position and her shoulder and arm was prepped using sterile Betadine and draped using sterile technique.  Originally through a  posterior arthroscopic portal, the arthroscope with a pump attached was placed and through an anterior portal an arthroscopic probe was placed.  On initial inspection, the articular cartilage and the glenohumeral joint was intact.  Anterior labrale and 25 to 30% partial tearing which was debrided.  Pseudoperilabrum was intact.  Biceps tendon anchor was intact.  Inferior labrum and anterior inferior glenohumeral ligaments were intact.  Posterior labrum was intact. Biceps tendon was intact.  Rotator cuff showed a complete tear of the supraspinatus.  A partial tear of the infraspinatus which was debrided.  The teres minor and subscapularis tendons were intact.  A lateral arthroscopic portal was made and the subacromial space was entered.  Moderately thickened bursitis was resected.  Underneath this a rotator cuff tear was visualized. It was further debrided arthroscopically.  Subacromial decompression was carried out removing 6 to 8 mm of the undersurface of the anterior, anterior lateral, and anterior medial acromion and CA ligament release carried out. The [facility] joint was exposed.  Significant spurring and degenerative changes noted and the distal 5 to 6 mm of clavicle was resected with a 6 mm bur.  After this was done, then through the lateral portal a 3 cm deltoid splitting incision was made.  The underlying subcutaneous tissues were incised in line with the skin incision.  Deltoid muscle was split longitudinally.   The subacromial space was entered.  Rotator cuff tear was visualized and sharply debrided.  An [employer] suture anchor was placed in the greater tuberosity and  each of the #2 Ethibon sutures was passed through the rotator cuff and then tied down thus resecuring the rotator cuff back down to the greater tuberosity.  After this was done, there was found to be very firm fixation.  The shoulder could be brought through a full range of motion with no impingement on the rotator  cuff repair.  Intraoperative fluoroscopy was then obtained to confirm adequate distal clavicle excision and this was confirmed.  After this was done, the wound was irrigated and closed using 0 Vicryl to close the deltoid fascia. Subcutaneous tissue was closed with 2-0 Vicryl.  Subcuticular layer closed with 3-0 Prolene.  Steri-Strips applied.  Sterile dressings and a sling applied.  The patient awakened and taken to the recovery room in stable condition.  FOLLOW UP CARE:  Maria Washington will be followed as an overnight recovery care patient for IV pain control and neurovascular monitoring.  Discharged tomorrow on Percocet and Naprosyn.  Seen back in the office in a week for sutures out and follow up. DD:  01/13/01 TD:  01/14/01 Job: [id number]

## 2011-04-26 NOTE — Op Note (Signed)
NAME:  Maria Washington, Maria Washington                          ACCOUNT NO.:  [id number]   MEDICAL RECORD NO.:  [id number]                   PATIENT TYPE:  AMB   LOCATION:  DSC                                  FACILITY:  [facility]   PHYSICIAN:  Katy Fitch. Naaman Plummer., M.D.          DATE OF BIRTH:  1929-02-13   DATE OF PROCEDURE:  10/19/2002  DATE OF DISCHARGE:                                 OPERATIVE REPORT   PREOPERATIVE DIAGNOSIS:  Chronic stenosing tenosynovitis, left index finger  at A-1 pulley.   POSTOPERATIVE DIAGNOSIS:  Chronic stenosing tenosynovitis, left index finger  at A-1 pulley.   OPERATION PERFORMED:  Release of left index finger A-1 pulley (16109-U0)   SURGEON:  Katy Fitch. Sypher, M.D.   ASSISTANT:  Jonni Sanger, P.A.   ANESTHESIA:  2% lidocaine and 0.25% Marcaine metacarpal head level block,  left index finger supplemented by IV sedation.   SUPERVISING ANESTHESIOLOGIST:  Dr. Katrinka Blazing.   INDICATIONS FOR PROCEDURE:  The patient is a 77 year old woman with a  history of chronic stenosing tenosynovitis of the left index finger.  She  has had failure to respond to nonoperative measures.  Therefore, she is  brought to the operating room at this time for release of her left index  finger A-1 pulley.   DESCRIPTION OF PROCEDURE:  The patient was brought to the operating room and  placed in supine position upon the operating table.  The left arm was  prepped with Betadine soap and solution and sterilely draped.  Light  sedation was administered.  0.25% Marcaine and 2% lidocaine were infiltrated  at the metacarpal head level to obtain digital block.  When anesthesia was  satisfactory, the arm was exsanguinated with an Esmarch bandage and an  arterial tourniquet was inflated to 250 mmHg on the proximal brachium.  The  procedure commenced with an oblique incision paralleling the distal palmar  crease.  The subcutaneous tissues were carefully divided revealing the  palmar fascia.  The  radial proper digital nerve to the index finger was  identified, gently retracted.  The A-1 pulley was isolated and split with  scissors.  A small A-0 pulley was also released.  The flexor tendon was  delivered.  There were some changes noted from previous injection.  The  tendons were edematous but otherwise normal.   The wound was inspected for bleeding points which were electrocauterized by  bipolar current followed by repair of the skin with interrupted mattress  suture of 5-0 nylon.  There were no apparent complications.  The patient  tolerated surgery and anesthesia well. She was then transferred to the  recovery room with stable vital signs.  Katy Fitch Naaman Plummer., M.D.    RVS/MEDQ  D:  10/19/2002  T:  10/19/2002  Job:  [id number]

## 2011-08-06 ENCOUNTER — Encounter (HOSPITAL_BASED_OUTPATIENT_CLINIC_OR_DEPARTMENT_OTHER): Payer: Medicare Other | Admitting: Oncology

## 2011-08-06 ENCOUNTER — Other Ambulatory Visit (HOSPITAL_COMMUNITY): Payer: Self-pay | Admitting: Oncology

## 2011-08-06 DIAGNOSIS — C50319 Malignant neoplasm of lower-inner quadrant of unspecified female breast: Secondary | ICD-10-CM

## 2011-08-06 DIAGNOSIS — R220 Localized swelling, mass and lump, head: Secondary | ICD-10-CM

## 2011-08-06 LAB — COMPREHENSIVE METABOLIC PANEL
CO2: 27 mEq/L (ref 19–32)
Calcium: 9.7 mg/dL (ref 8.4–10.5)
Chloride: 108 mEq/L (ref 96–112)
Creatinine, Ser: 1.06 mg/dL (ref 0.50–1.10)
Glucose, Bld: 105 mg/dL — ABNORMAL HIGH (ref 70–99)
Total Bilirubin: 0.4 mg/dL (ref 0.3–1.2)

## 2011-08-06 LAB — CBC WITH DIFFERENTIAL/PLATELET
Basophils Absolute: 0 10*3/uL (ref 0.0–0.1)
Eosinophils Absolute: 0.1 10*3/uL (ref 0.0–0.5)
HCT: 40 % (ref 34.8–46.6)
HGB: 14 g/dL (ref 11.6–15.9)
LYMPH%: 26.7 % (ref 14.0–49.7)
MONO#: 0.6 10*3/uL (ref 0.1–0.9)
NEUT#: 3.5 10*3/uL (ref 1.5–6.5)
NEUT%: 60 % (ref 38.4–76.8)
Platelets: 156 10*3/uL (ref 145–400)
WBC: 5.9 10*3/uL (ref 3.9–10.3)

## 2011-08-06 LAB — LACTATE DEHYDROGENASE: LDH: 152 U/L (ref 94–250)

## 2011-09-03 ENCOUNTER — Other Ambulatory Visit (HOSPITAL_COMMUNITY): Payer: Self-pay | Admitting: Oncology

## 2011-09-03 DIAGNOSIS — Z1231 Encounter for screening mammogram for malignant neoplasm of breast: Secondary | ICD-10-CM

## 2011-10-07 ENCOUNTER — Ambulatory Visit
Admission: RE | Admit: 2011-10-07 | Discharge: 2011-10-07 | Disposition: A | Discharge status: Home / Self Care | Payer: Medicare Other | Source: Ambulatory Visit | Attending: Oncology | Admitting: Oncology

## 2011-10-07 DIAGNOSIS — Z1231 Encounter for screening mammogram for malignant neoplasm of breast: Secondary | ICD-10-CM

## 2011-12-02 ENCOUNTER — Other Ambulatory Visit: Payer: Self-pay

## 2011-12-02 ENCOUNTER — Inpatient Hospital Stay (HOSPITAL_COMMUNITY)
Admission: EM | Admit: 2011-12-02 | Discharge: 2011-12-05 | DRG: 243 | Disposition: A | Discharge status: Home / Self Care | Payer: Medicare Other | Attending: Cardiology | Admitting: Cardiology

## 2011-12-02 ENCOUNTER — Encounter: Payer: Self-pay | Admitting: *Deleted

## 2011-12-02 DIAGNOSIS — Z853 Personal history of malignant neoplasm of breast: Secondary | ICD-10-CM

## 2011-12-02 DIAGNOSIS — Z79899 Other long term (current) drug therapy: Secondary | ICD-10-CM

## 2011-12-02 DIAGNOSIS — Z888 Allergy status to other drugs, medicaments and biological substances status: Secondary | ICD-10-CM

## 2011-12-02 DIAGNOSIS — F17200 Nicotine dependence, unspecified, uncomplicated: Secondary | ICD-10-CM | POA: Diagnosis present

## 2011-12-02 DIAGNOSIS — Z6830 Body mass index (BMI) 30.0-30.9, adult: Secondary | ICD-10-CM

## 2011-12-02 DIAGNOSIS — J449 Chronic obstructive pulmonary disease, unspecified: Secondary | ICD-10-CM | POA: Insufficient documentation

## 2011-12-02 DIAGNOSIS — Y831 Surgical operation with implant of artificial internal device as the cause of abnormal reaction of the patient, or of later complication, without mention of misadventure at the time of the procedure: Secondary | ICD-10-CM | POA: Diagnosis not present

## 2011-12-02 DIAGNOSIS — T82897A Other specified complication of cardiac prosthetic devices, implants and grafts, initial encounter: Secondary | ICD-10-CM | POA: Diagnosis not present

## 2011-12-02 DIAGNOSIS — J4489 Other specified chronic obstructive pulmonary disease: Secondary | ICD-10-CM | POA: Diagnosis present

## 2011-12-02 DIAGNOSIS — E876 Hypokalemia: Secondary | ICD-10-CM | POA: Diagnosis present

## 2011-12-02 DIAGNOSIS — I4510 Unspecified right bundle-branch block: Secondary | ICD-10-CM | POA: Diagnosis present

## 2011-12-02 DIAGNOSIS — R0902 Hypoxemia: Secondary | ICD-10-CM | POA: Diagnosis not present

## 2011-12-02 DIAGNOSIS — R197 Diarrhea, unspecified: Secondary | ICD-10-CM | POA: Diagnosis not present

## 2011-12-02 DIAGNOSIS — R110 Nausea: Secondary | ICD-10-CM | POA: Diagnosis not present

## 2011-12-02 DIAGNOSIS — E039 Hypothyroidism, unspecified: Secondary | ICD-10-CM | POA: Insufficient documentation

## 2011-12-02 DIAGNOSIS — I4891 Unspecified atrial fibrillation: Secondary | ICD-10-CM | POA: Diagnosis present

## 2011-12-02 DIAGNOSIS — I749 Embolism and thrombosis of unspecified artery: Secondary | ICD-10-CM | POA: Diagnosis not present

## 2011-12-02 DIAGNOSIS — I119 Hypertensive heart disease without heart failure: Secondary | ICD-10-CM | POA: Insufficient documentation

## 2011-12-02 DIAGNOSIS — I441 Atrioventricular block, second degree: Principal | ICD-10-CM | POA: Diagnosis present

## 2011-12-02 DIAGNOSIS — Z880 Allergy status to penicillin: Secondary | ICD-10-CM

## 2011-12-02 DIAGNOSIS — I10 Essential (primary) hypertension: Secondary | ICD-10-CM | POA: Diagnosis present

## 2011-12-02 DIAGNOSIS — Y9210 Unspecified residential institution as the place of occurrence of the external cause: Secondary | ICD-10-CM | POA: Diagnosis not present

## 2011-12-02 DIAGNOSIS — Z950 Presence of cardiac pacemaker: Secondary | ICD-10-CM

## 2011-12-02 HISTORY — DX: Hypothyroidism, unspecified: E03.9

## 2011-12-02 HISTORY — DX: Presence of cardiac pacemaker: Z95.0

## 2011-12-02 HISTORY — DX: Personal history of malignant neoplasm of breast: Z85.3

## 2011-12-02 HISTORY — DX: Atrioventricular block, second degree: I44.1

## 2011-12-02 HISTORY — DX: Unspecified osteoarthritis, unspecified site: M19.90

## 2011-12-02 HISTORY — DX: Chronic obstructive pulmonary disease, unspecified: J44.9

## 2011-12-02 LAB — POCT I-STAT, CHEM 8
Creatinine, Ser: 0.9 mg/dL (ref 0.50–1.10)
HCT: 46 % (ref 36.0–46.0)
Hemoglobin: 15.6 g/dL — ABNORMAL HIGH (ref 12.0–15.0)
Potassium: 3.9 mEq/L (ref 3.5–5.1)
Sodium: 143 mEq/L (ref 135–145)

## 2011-12-02 LAB — DIFFERENTIAL
Eosinophils Absolute: 0.2 10*3/uL (ref 0.0–0.7)
Eosinophils Relative: 3 % (ref 0–5)
Lymphocytes Relative: 37 % (ref 12–46)
Lymphs Abs: 2.3 10*3/uL (ref 0.7–4.0)
Monocytes Absolute: 0.6 10*3/uL (ref 0.1–1.0)
Monocytes Relative: 9 % (ref 3–12)
Neutro Abs: 3.3 10*3/uL (ref 1.7–7.7)

## 2011-12-02 LAB — COMPREHENSIVE METABOLIC PANEL
ALT: 12 U/L (ref 0–35)
Alkaline Phosphatase: 69 U/L (ref 39–117)
CO2: 23 mEq/L (ref 19–32)
Chloride: 105 mEq/L (ref 96–112)
GFR calc Af Amer: 72 mL/min — ABNORMAL LOW (ref >90)
GFR calc non Af Amer: 62 mL/min — ABNORMAL LOW (ref >90)
Glucose, Bld: 188 mg/dL — ABNORMAL HIGH (ref 70–99)
Potassium: 3.3 mEq/L — ABNORMAL LOW (ref 3.5–5.1)
Sodium: 140 mEq/L (ref 135–145)
Total Protein: 6.6 g/dL (ref 6.0–8.3)

## 2011-12-02 LAB — APTT: aPTT: 31 seconds (ref 24–37)

## 2011-12-02 LAB — CBC
HCT: 41.2 % (ref 36.0–46.0)
Hemoglobin: 13.9 g/dL (ref 12.0–15.0)
Hemoglobin: 13.6 g/dL (ref 12.0–15.0)
RBC: 4.39 MIL/uL (ref 3.87–5.11)
RBC: 4.25 MIL/uL (ref 3.87–5.11)
WBC: 6.6 10*3/uL (ref 4.0–10.5)
WBC: 5.7 10*3/uL (ref 4.0–10.5)

## 2011-12-02 LAB — BASIC METABOLIC PANEL
Chloride: 104 mEq/L (ref 96–112)
GFR calc Af Amer: 75 mL/min — ABNORMAL LOW (ref >90)
Potassium: 3.9 mEq/L (ref 3.5–5.1)
Sodium: 138 mEq/L (ref 135–145)

## 2011-12-02 LAB — CK TOTAL AND CKMB (NOT AT ARMC)
CK, MB: 3.4 ng/mL (ref 0.3–4.0)
Total CK: 101 U/L (ref 7–177)

## 2011-12-02 LAB — MRSA PCR SCREENING: MRSA by PCR: NEGATIVE

## 2011-12-02 MED ORDER — CELECOXIB 200 MG PO CAPS
200.0000 mg | ORAL_CAPSULE | Freq: Every day | ORAL | Status: DC
  Administered 2011-12-03 – 2011-12-05 (×2): 200 mg via ORAL
  Filled 2011-12-02 (×4): qty 1

## 2011-12-02 MED ORDER — VITAMIN B-12 1000 MCG PO TABS
1000.0000 ug | ORAL_TABLET | Freq: Every day | ORAL | Status: DC
  Administered 2011-12-03 – 2011-12-05 (×2): 1000 ug via ORAL
  Filled 2011-12-02 (×4): qty 1

## 2011-12-02 MED ORDER — ACETAMINOPHEN 325 MG PO TABS: 650.0000 mg | ORAL_TABLET | ORAL | Status: DC | PRN

## 2011-12-02 MED ORDER — POTASSIUM CHLORIDE CRYS ER 20 MEQ PO TBCR
20.0000 meq | EXTENDED_RELEASE_TABLET | Freq: Two times a day (BID) | ORAL | Status: DC
  Administered 2011-12-02: 20 meq via ORAL
  Administered 2011-12-03: 40 meq via ORAL
  Administered 2011-12-03 – 2011-12-05 (×3): 20 meq via ORAL
  Filled 2011-12-02 (×6): qty 1
  Filled 2011-12-02: qty 2
  Filled 2011-12-02 (×2): qty 1

## 2011-12-02 MED ORDER — LEVOTHYROXINE SODIUM 125 MCG PO TABS
125.0000 ug | ORAL_TABLET | ORAL | Status: DC
  Administered 2011-12-03 – 2011-12-05 (×3): 125 ug via ORAL
  Filled 2011-12-02 (×6): qty 1

## 2011-12-02 MED ORDER — BENAZEPRIL HCL 20 MG PO TABS
20.0000 mg | ORAL_TABLET | Freq: Every day | ORAL | Status: DC
  Administered 2011-12-03 – 2011-12-05 (×2): 20 mg via ORAL
  Filled 2011-12-02 (×4): qty 1

## 2011-12-02 MED ORDER — ENOXAPARIN SODIUM 40 MG/0.4ML ~~LOC~~ SOLN
40.0000 mg | Freq: Every day | SUBCUTANEOUS | Status: DC
  Administered 2011-12-02 – 2011-12-03 (×2): 40 mg via SUBCUTANEOUS
  Filled 2011-12-02 (×3): qty 0.4

## 2011-12-02 MED ORDER — CALCIUM CARBONATE-VITAMIN D 500-200 MG-UNIT PO TABS
1.0000 | ORAL_TABLET | Freq: Two times a day (BID) | ORAL | Status: DC
  Administered 2011-12-02 – 2011-12-05 (×5): 1 via ORAL
  Filled 2011-12-02 (×8): qty 1

## 2011-12-02 MED ORDER — TOLTERODINE TARTRATE ER 4 MG PO CP24
4.0000 mg | ORAL_CAPSULE | Freq: Every day | ORAL | Status: DC
  Administered 2011-12-03 – 2011-12-05 (×2): 4 mg via ORAL
  Filled 2011-12-02 (×4): qty 1

## 2011-12-02 MED ORDER — FUROSEMIDE 40 MG PO TABS
40.0000 mg | ORAL_TABLET | ORAL | Status: DC
  Administered 2011-12-03 – 2011-12-05 (×2): 40 mg via ORAL
  Filled 2011-12-02 (×2): qty 1

## 2011-12-02 MED ORDER — FOLIC ACID 1 MG PO TABS
1.0000 mg | ORAL_TABLET | Freq: Every day | ORAL | Status: DC
  Administered 2011-12-03 – 2011-12-05 (×2): 1 mg via ORAL
  Filled 2011-12-02 (×4): qty 1

## 2011-12-02 MED ORDER — TRIAMTERENE-HCTZ 37.5-25 MG PO TABS
1.0000 | ORAL_TABLET | Freq: Every day | ORAL | Status: DC
  Administered 2011-12-03 – 2011-12-05 (×2): 1 via ORAL
  Filled 2011-12-02 (×4): qty 1

## 2011-12-02 NOTE — ED Notes (Signed)
Patient states woke up today with a low heart rate and intermittent dizziness.  At rest dizziness resolves and with movement returns.  Family brought patient for evaluation. Patient denies any chest pain or shortness of breath.  Ax4. Airway intact bilateral equal chest rise and fall.

## 2011-12-02 NOTE — ED Notes (Signed)
Placed call for diet tray 

## 2011-12-02 NOTE — ED Notes (Signed)
Cardiology at bedside.

## 2011-12-02 NOTE — ED Notes (Signed)
Per patient's family, patient's heart rate was low around 42.  Patient states that she felt dizzy.  Heart rate at home was at 38 per daughter.  Patient only complains of being "whoozy."

## 2011-12-02 NOTE — H&P (Signed)
Admit date: 12/02/2011 Name:  Maria Washington Medical record number: [id number] DOB/Age:  [birth year]  75 y.o.  Referring Physician:   Dr. Remi Haggard  Primary physician: Dr. Trula Slade  Chief complaint/reason for admission:  Dizziness  HPI:  This 77 year old female has a history of hypertension and a long history of tobacco abuse as well as hypothyroidism. She has noticed some episodic dizziness but today had some dizziness and when checking her blood pressure was noted to have a pulse in the 40s. This was concerning to her daughters and asked her to come to the emergency room where she was found to be in second-degree heart block on an initial EKG that resolved and she is now in sinus rhythm. She has not had any syncopal episodes. She does not have any chest discomfort suggestive of angina and has no PND, orthopnea or edema. She recalls perhaps having had a murmur previously.    Past Medical History  Diagnosis Date  . Hypertension   . Hypothyroid   . History of breast cancer   . Second degree heart block   . COPD (chronic obstructive pulmonary disease)     Past Surgical History  Procedure Date  . Shoulder surgery   . Breast biopsy   . Finger surgery   . Cholecystectomy   . Tonsillectomy and adenoidectomy   . Abdominal hysterectomy   .   Allergies: is allergic to clindamycin/lincomycin and penicillins.    Medications:   Prior to Admission medications   Medication Sig Start Date End Date Taking? Authorizing Provider  Alum & Mag Hydroxide-Simeth (MAGIC MOUTHWASH) SOLN Take 5 mLs by mouth every 5 (five) hours as needed. Gargle and swallow    Yes Historical Provider, MD  benazepril (LOTENSIN) 20 MG tablet Take 20 mg by mouth daily.     Yes Historical Provider, MD  calcium-vitamin D (OSCAL WITH D) 500-200 MG-UNIT per tablet Take 1 tablet by mouth 2 (two) times daily.     Yes Historical Provider, MD  celecoxib (CELEBREX) 200 MG capsule Take 200 mg by mouth daily.     Yes  Historical Provider, MD  diclofenac sodium (VOLTAREN) 1 % GEL Apply 1 application topically 4 (four) times daily as needed. For pain.    Yes Historical Provider, MD  folic acid (FOLVITE) 1 MG tablet Take 1 mg by mouth daily.     Yes Historical Provider, MD  furosemide (LASIX) 40 MG tablet Take 40 mg by mouth every other day.     Yes Historical Provider, MD  levothyroxine (SYNTHROID, LEVOTHROID) 125 MCG tablet Take 125 mcg by mouth every morning. On an empty stomach    Yes Historical Provider, MD  Omega-3 Fatty Acids (FISH OIL) 1200 MG CAPS Take 1 capsule by mouth 2 (two) times daily.     Yes Historical Provider, MD  potassium chloride SA (K-DUR,KLOR-CON) 20 MEQ tablet Take 20 mEq by mouth 2 (two) times daily.     Yes Historical Provider, MD  tolterodine (DETROL LA) 4 MG 24 hr capsule Take 4 mg by mouth daily.     Yes Historical Provider, MD  triamterene-hydrochlorothiazide (MAXZIDE-25) 37.5-25 MG per tablet Take 1 tablet by mouth daily.     Yes Historical Provider, MD  vitamin B-12 (CYANOCOBALAMIN) 1000 MCG tablet Take 1,000 mcg by mouth daily.     Yes Historical Provider, MD    Family history  indicated that her mother is deceased. She indicated that her father is deceased. She indicated that both of her sisters  are alive. She indicated that her brother is alive.   Social history:   reports that she has been smoking Cigarettes.  She has a 60 pack-year smoking history. She has never used smokeless tobacco. She reports that she does not drink alcohol or use illicit drugs.   Review of Systems:  General ROS: positive for  - weight gain Psychological ROS: negative Ophthalmic ROS: positive for - cataracts Respiratory ROS: no cough, shortness of breath, or wheezing Gastrointestinal ROS: Has irritable bowel syndrome and has occasional diarrhea after meals Genito-Urinary ROS: no dysuria, trouble voiding, or hematuria Neurological ROS: no TIA or stroke symptoms Dermatological ROS: Dry skin Other  than as noted above, the remainder of the review of systems is normal  Physical Exam: BP 127/69  Pulse 64  Temp 98.4 F (36.9 C)  Resp 16  SpO2 100% General appearance: alert, cooperative, appears stated age and no distress Head: Normocephalic, without obvious abnormality, atraumatic Eyes: conjunctivae/corneas clear. PERRL, EOM's intact. Fundi not examined  Throat: lips, mucosa, and tongue normal; teeth and gums normal Neck: no adenopathy, no carotid bruit, no JVD and supple, symmetrical, trachea midline Lungs: clear to auscultation bilaterally Heart: regular rate and rhythm, S1, S2 normal, no murmur, click, rub or gallop Abdomen: soft, non-tender; bowel sounds normal; no masses,  no organomegaly Pulses: 2+ and symmetric Skin: Skin color, texture, turgor normal. No rashes or lesions Neurologic: Alert and oriented X 3, normal strength and tone. Normal symmetric reflexes. Normal coordination and gait  Labs: Results for orders placed during the hospital encounter of 12/02/11 (from the past 24 hour(s))  TROPONIN I     Status: Normal   Collection Time   12/02/11  4:03 PM      Component Value Range   Troponin I <0.30  <0.30 (ng/mL)  CK TOTAL AND CKMB     Status: Abnormal   Collection Time   12/02/11  4:03 PM      Component Value Range   Total CK 101  7 - 177 (U/L)   CK, MB 3.4  0.3 - 4.0 (ng/mL)   Relative Index 3.4 (*) 0.0 - 2.5   POCT I-STAT, CHEM 8     Status: Abnormal   Collection Time   12/02/11  4:24 PM      Component Value Range   Sodium 143  135 - 145 (mEq/L)   Potassium 3.9  3.5 - 5.1 (mEq/L)   Chloride 108  96 - 112 (mEq/L)   BUN 17  6 - 23 (mg/dL)   Creatinine, Ser 1.61  0.50 - 1.10 (mg/dL)   Glucose, Bld 88  70 - 99 (mg/dL)   Calcium, Ion 0.96  0.45 - 1.32 (mmol/L)   TCO2 24  0 - 100 (mmol/L)   Hemoglobin 15.6 (*) 12.0 - 15.0 (g/dL)   HCT 40.9  81.1 - 91.4 (%)  CBC     Status: Abnormal   Collection Time   12/02/11  6:16 PM      Component Value Range   WBC  6.6  4.0 - 10.5 (K/uL)   RBC 4.39  3.87 - 5.11 (MIL/uL)   Hemoglobin 13.9  12.0 - 15.0 (g/dL)   HCT 78.2  95.6 - 21.3 (%)   MCV 93.8  78.0 - 100.0 (fL)   MCH 31.7  26.0 - 34.0 (pg)   MCHC 33.7  30.0 - 36.0 (g/dL)   RDW 08.6  57.8 - 46.9 (%)   Platelets 136 (*) 150 - 400 (K/uL)  DIFFERENTIAL  Status: Normal   Collection Time   12/02/11  6:16 PM      Component Value Range   Neutrophils Relative 50  43 - 77 (%)   Neutro Abs 3.3  1.7 - 7.7 (K/uL)   Lymphocytes Relative 37  12 - 46 (%)   Lymphs Abs 2.4  0.7 - 4.0 (K/uL)   Monocytes Relative 9  3 - 12 (%)   Monocytes Absolute 0.6  0.1 - 1.0 (K/uL)   Eosinophils Relative 3  0 - 5 (%)   Eosinophils Absolute 0.2  0.0 - 0.7 (K/uL)   Basophils Relative 1  0 - 1 (%)   Basophils Absolute 0.0  0.0 - 0.1 (K/uL)    EKG: Right bundle branch block, left axis deviation, 2-1 heart block initially  Radiology: Not available    IMPRESSIONS:  1. Dizziness likely due 2:1 heart block. Suspect this is conduction system disease in that she is not on currently any medications that would cause this and has an underlying right bundle branch block and left axis deviation 2. Hypertension 3. Hypothyroidism 4. COPD with tobacco abuse 5. History of breast cancer  PLAN:  Telemetry monitoring. Check 2-D echocardiogram. She likely will require a permanent pacemaker and will consult electrophysiology. Use temporary pacemaker if needed.   Signed: Darden Palmer. MD [facility] 12/02/2011, 7:00 PM

## 2011-12-02 NOTE — ED Provider Notes (Signed)
History     CSN: [id number]  Arrival date & time 12/02/11  1542   First MD Initiated Contact with Patient 12/02/11 1656      Chief Complaint  Patient presents with  . Bradycardia    (Consider location/radiation/quality/duration/timing/severity/associated sxs/prior treatment) HPI Complains of generalized weakness onset upon awakening 8:30 AM today. Patient took her pulse with her blood pressure monitor which was noted to be as low as 38 no treatment prior to coming here patient is asymptomatic presently without treatment she denies any chest pain denies shortness of breath denies other complaint no other associated symptoms Past Medical History  Diagnosis Date  . Hypertension   . Hypothyroid    arthritis  History reviewed. No pertinent past surgical history.  No family history on file.  History  Substance Use Topics  . Smoking status: Current Everyday Smoker -- 1.0 packs/day    Types: Cigarettes  . Smokeless tobacco: Not on file  . Alcohol Use: No    OB History    Grav Para Term Preterm Abortions TAB SAB Ect Mult Living                  Review of Systems  Constitutional: Positive for fatigue.  HENT: Negative.   Respiratory: Negative.   Cardiovascular: Negative.   Gastrointestinal: Negative.   Musculoskeletal: Negative.   Skin: Negative.   Neurological: Negative.   Hematological: Negative.   Psychiatric/Behavioral: Negative.   All other systems reviewed and are negative.    Allergies  Clindamycin/lincomycin and Penicillins  Home Medications   Current Outpatient Rx  Name Route Sig Dispense Refill  . MAGIC MOUTHWASH Oral Take 5 mLs by mouth every 5 (five) hours as needed. Gargle and swallow     . BENAZEPRIL HCL 20 MG PO TABS Oral Take 20 mg by mouth daily.      Marland Kitchen CALCIUM CARBONATE-VITAMIN D 500-200 MG-UNIT PO TABS Oral Take 1 tablet by mouth 2 (two) times daily.      . CELECOXIB 200 MG PO CAPS Oral Take 200 mg by mouth daily.      Marland Kitchen DICLOFENAC SODIUM 1  % TD GEL Topical Apply 1 application topically 4 (four) times daily as needed. For pain.     Marland Kitchen FOLIC ACID 1 MG PO TABS Oral Take 1 mg by mouth daily.      . FUROSEMIDE 40 MG PO TABS Oral Take 40 mg by mouth every other day.      Marland Kitchen LEVOTHYROXINE SODIUM 125 MCG PO TABS Oral Take 125 mcg by mouth every morning. On an empty stomach     . FISH OIL 1200 MG PO CAPS Oral Take 1 capsule by mouth 2 (two) times daily.      Marland Kitchen POTASSIUM CHLORIDE CRYS CR 20 MEQ PO TBCR Oral Take 20 mEq by mouth 2 (two) times daily.      . TOLTERODINE TARTRATE 4 MG PO CP24 Oral Take 4 mg by mouth daily.      . TRIAMTERENE-HCTZ 37.5-25 MG PO TABS Oral Take 1 tablet by mouth daily.      Marland Kitchen VITAMIN B-12 1000 MCG PO TABS Oral Take 1,000 mcg by mouth daily.        BP 118/100  Pulse 64  Temp 98.4 F (36.9 C)  Resp 18  SpO2 100%  Physical Exam  Nursing note and vitals reviewed. Constitutional: She appears well-developed and well-nourished.  HENT:  Head: Normocephalic and atraumatic.  Eyes: Conjunctivae are normal. Pupils are equal, round, and reactive  to light.  Neck: Neck supple. No tracheal deviation present. No thyromegaly present.  Cardiovascular: Normal rate and regular rhythm.   No murmur heard. Pulmonary/Chest: Effort normal and breath sounds normal.  Abdominal: Soft. Bowel sounds are normal. She exhibits no distension. There is no tenderness.  Musculoskeletal: Normal range of motion. She exhibits no edema and no tenderness.  Neurological: She is alert. Coordination normal.  Skin: Skin is warm and dry. No rash noted.  Psychiatric: She has a normal mood and affect.    Date: 12/02/2011  Rate: 40  Rhythm: Third degree heart block  QRS Axis: left  Intervals: normal  ST/T Wave abnormalities: nonspecific ST/T changes  Conduction Disutrbances:none  Narrative Interpretation:   Old EKG Reviewed: changes noted Previous tracing from 10/18/2002 showed normal sinus rhythm ED Course  Procedures (including critical  care time)  Labs Reviewed  CK TOTAL AND CKMB - Abnormal; Notable for the following:    Relative Index 3.4 (*)    All other components within normal limits  POCT I-STAT, CHEM 8 - Abnormal; Notable for the following:    Hemoglobin 15.6 (*)    All other components within normal limits  TROPONIN I  I-STAT, CHEM 8   spoke with Dr.Tilley, will have cardiology evaluate patient No results found.   No diagnosis found.  Results for orders placed during the hospital encounter of 12/02/11  TROPONIN I      Component Value Range   Troponin I <0.30  <0.30 (ng/mL)  CK TOTAL AND CKMB      Component Value Range   Total CK 101  7 - 177 (U/L)   CK, MB 3.4  0.3 - 4.0 (ng/mL)   Relative Index 3.4 (*) 0.0 - 2.5   POCT I-STAT, CHEM 8      Component Value Range   Sodium 143  135 - 145 (mEq/L)   Potassium 3.9  3.5 - 5.1 (mEq/L)   Chloride 108  96 - 112 (mEq/L)   BUN 17  6 - 23 (mg/dL)   Creatinine, Ser 0.45  0.50 - 1.10 (mg/dL)   Glucose, Bld 88  70 - 99 (mg/dL)   Calcium, Ion 4.09  8.11 - 1.32 (mmol/L)   TCO2 24  0 - 100 (mmol/L)   Hemoglobin 15.6 (*) 12.0 - 15.0 (g/dL)   HCT 91.4  78.2 - 95.6 (%)   No results found.   MDM  Cardiology evaluation in the emergency department for possible pacemaker  Diagnosis third degree heart block        Doug Sou, MD 12/02/11 1816

## 2011-12-03 ENCOUNTER — Inpatient Hospital Stay (HOSPITAL_COMMUNITY): Payer: Medicare Other

## 2011-12-03 MED ORDER — POTASSIUM CHLORIDE 20 MEQ/15ML (10%) PO LIQD
40.0000 meq | Freq: Once | ORAL | Status: DC
  Filled 2011-12-03: qty 30

## 2011-12-03 NOTE — Progress Notes (Signed)
  Echocardiogram 2D Echocardiogram has been performed.  Maria Washington 12/03/2011, 9:05 AM

## 2011-12-03 NOTE — Progress Notes (Signed)
Subjective:  No dizziness overnight.  No SOB or chest pain.  Objective:  Vital Signs in the last 24 hours: BP 85/57  Pulse 55  Temp(Src) 97.3 F (36.3 C) (Oral)  Resp 19  Ht 5' (1.524 m)  Wt 71.3 kg (157 lb 3 oz)  BMI 30.70 kg/m2  SpO2 97%  Physical Exam: Pleasant white female in NAD Lungs:  Clear to A&P Cardiac:  Regular rhythm, normal S1 and S2, no S3 Extremities:  No edema present  Intake/Output from previous day: 12/24 0701 - 12/25 0700 In: 100 [P.O.:100] Out: 700 [Urine:700]  Lab Results: Basic Metabolic Panel:  [facility] 12/02/11 2206 12/02/11 1816  NA 140 138  K 3.3* 3.9  CL 105 104  CO2 23 23  GLUCOSE 188* 89  BUN 16 15  CREATININE 0.86 0.83    CBC:  Basename 12/02/11 2206 12/02/11 1816  WBC 5.7 6.6  NEUTROABS 2.7 3.3  HGB 13.6 13.9  HCT 39.9 41.2  MCV 93.9 93.8  PLT 131* 136*    PROTIME: Lab Results  Component Value Date   INR 1.03 12/02/2011    Telemetry: Reviewed  In sinus overnight.  No heart block overnight.  Assessment/Plan:  1. 2:1 heart block 2. RBBB/LAD 3. COPD 4. Hypokalemia  Rec:   NPO  For possible pacer insertion. Replete K.  EP consult.Check CXR.     Darden Palmer.  MD [facility] 12/03/2011, 7:40 AM

## 2011-12-04 ENCOUNTER — Encounter (HOSPITAL_COMMUNITY)
Admission: EM | Disposition: A | Discharge status: Home / Self Care | Payer: Self-pay | Source: Home / Self Care | Attending: Cardiology

## 2011-12-04 ENCOUNTER — Other Ambulatory Visit: Payer: Self-pay

## 2011-12-04 DIAGNOSIS — I441 Atrioventricular block, second degree: Secondary | ICD-10-CM

## 2011-12-04 DIAGNOSIS — Z950 Presence of cardiac pacemaker: Secondary | ICD-10-CM

## 2011-12-04 HISTORY — PX: PERMANENT PACEMAKER INSERTION: SHX5480

## 2011-12-04 LAB — BASIC METABOLIC PANEL
CO2: 27 mEq/L (ref 19–32)
Calcium: 9.4 mg/dL (ref 8.4–10.5)
Glucose, Bld: 110 mg/dL — ABNORMAL HIGH (ref 70–99)
Sodium: 139 mEq/L (ref 135–145)

## 2011-12-04 SURGERY — PERMANENT PACEMAKER INSERTION
Anesthesia: LOCAL

## 2011-12-04 MED ORDER — ONDANSETRON HCL 4 MG/2ML IJ SOLN
INTRAMUSCULAR | Status: AC
  Filled 2011-12-04: qty 2

## 2011-12-04 MED ORDER — DILTIAZEM HCL 100 MG IV SOLR
5.0000 mg/h | INTRAVENOUS | Status: DC
  Filled 2011-12-04: qty 100

## 2011-12-04 MED ORDER — FENTANYL CITRATE 0.05 MG/ML IJ SOLN
INTRAMUSCULAR | Status: AC
  Filled 2011-12-04: qty 2

## 2011-12-04 MED ORDER — SODIUM CHLORIDE 0.9 % IR SOLN
80.0000 mg | Status: DC
  Filled 2011-12-04: qty 2

## 2011-12-04 MED ORDER — OXYCODONE HCL 5 MG PO TABS: 5.0000 mg | ORAL_TABLET | ORAL | Status: DC | PRN

## 2011-12-04 MED ORDER — ONDANSETRON HCL 4 MG/2ML IJ SOLN
4.0000 mg | Freq: Four times a day (QID) | INTRAMUSCULAR | Status: DC | PRN
  Administered 2011-12-04: 4 mg via INTRAVENOUS

## 2011-12-04 MED ORDER — POTASSIUM CHLORIDE 20 MEQ/15ML (10%) PO LIQD
40.0000 meq | Freq: Once | ORAL | Status: DC
  Filled 2011-12-04: qty 30

## 2011-12-04 MED ORDER — CHLORHEXIDINE GLUCONATE 4 % EX LIQD
60.0000 mL | Freq: Once | CUTANEOUS | Status: AC
  Administered 2011-12-04: 4 via TOPICAL

## 2011-12-04 MED ORDER — MIDAZOLAM HCL 5 MG/5ML IJ SOLN
INTRAMUSCULAR | Status: AC
  Filled 2011-12-04: qty 5

## 2011-12-04 MED ORDER — HEPARIN (PORCINE) IN NACL 2-0.9 UNIT/ML-% IJ SOLN
INTRAMUSCULAR | Status: AC
  Filled 2011-12-04: qty 1000

## 2011-12-04 MED ORDER — SODIUM CHLORIDE 0.9 % IJ SOLN: 3.0000 mL | INTRAMUSCULAR | Status: DC | PRN

## 2011-12-04 MED ORDER — LOPERAMIDE HCL 2 MG PO CAPS: 2.0000 mg | ORAL_CAPSULE | ORAL | Status: DC | PRN

## 2011-12-04 MED ORDER — SODIUM CHLORIDE 0.9 % IJ SOLN
3.0000 mL | Freq: Two times a day (BID) | INTRAMUSCULAR | Status: DC
  Administered 2011-12-04: 3 mL via INTRAVENOUS

## 2011-12-04 MED ORDER — SODIUM CHLORIDE 0.9 % IV SOLN: 250.0000 mL | INTRAVENOUS | Status: DC | PRN

## 2011-12-04 MED ORDER — LOPERAMIDE HCL 2 MG PO CAPS
4.0000 mg | ORAL_CAPSULE | ORAL | Status: AC
  Administered 2011-12-04: 4 mg via ORAL
  Filled 2011-12-04: qty 2

## 2011-12-04 MED ORDER — BENAZEPRIL HCL 20 MG PO TABS
20.0000 mg | ORAL_TABLET | ORAL | Status: DC
  Filled 2011-12-04: qty 1

## 2011-12-04 MED ORDER — SODIUM CHLORIDE 0.9 % IV SOLN
INTRAVENOUS | Status: DC
  Administered 2011-12-04: 10:00:00 via INTRAVENOUS

## 2011-12-04 MED ORDER — LIDOCAINE HCL (PF) 1 % IJ SOLN
INTRAMUSCULAR | Status: AC
  Filled 2011-12-04: qty 60

## 2011-12-04 MED ORDER — CHLORHEXIDINE GLUCONATE 4 % EX LIQD
60.0000 mL | Freq: Once | CUTANEOUS | Status: AC
  Administered 2011-12-04: 4 via TOPICAL
  Filled 2011-12-04: qty 60

## 2011-12-04 MED ORDER — SODIUM CHLORIDE 0.45 % IV SOLN
INTRAVENOUS | Status: DC
  Administered 2011-12-04: 11:00:00 via INTRAVENOUS

## 2011-12-04 MED ORDER — VANCOMYCIN HCL IN DEXTROSE 1-5 GM/200ML-% IV SOLN
1000.0000 mg | INTRAVENOUS | Status: DC
  Filled 2011-12-04: qty 200

## 2011-12-04 NOTE — Op Note (Signed)
DDD PPM implanted via left subclavian vein. Z#[id number].

## 2011-12-04 NOTE — Progress Notes (Signed)
Dr Ladona Ridgel and Dr Donnie Aho notified of pt's continued nausea and vomiting despite IV Zofran and po Imodium.  Dr Ladona Ridgel stated that he DOES want pt to receive 2nd dose of Vancomycin in AM.

## 2011-12-04 NOTE — Consult Note (Signed)
  Reason for Consult: Symptomatic bradycardia Referring Physician: [name] is an 77 y.o. female.   HPI: The patient is a very pleasant 77 yo woman with a h/o HTN, breast CA, and COPD. She has experienced dizziness for the past month. She was noted to have a HR of 40/min on her blood pressure cuff and was admitted with 2:1 heart block. She has never had frank syncope but does note mild increased dyspnea. No chest pain.   PMH: Past Medical History  Diagnosis Date  . Hypertension   . Hypothyroid   . History of breast cancer   . Second degree heart block   . Heart murmur   . Cancer     left breast cA  . Arthritis   . COPD (chronic obstructive pulmonary disease)     spot on lung  . Anemia     after giving birth    PSHX: Past Surgical History  Procedure Date  . Shoulder surgery   . Finger surgery   . Cholecystectomy   . Tonsillectomy and adenoidectomy   . Abdominal hysterectomy   . Breast biopsy     lumpectomy  . Carpel tunnel surgery     left wrist    FAMHX: Family History  Problem Relation Age of Onset  . Cancer Father   . Heart attack Mother   . Cancer Brother     Social History:  reports that she has been smoking Cigarettes.  She has a 60 pack-year smoking history. She has never used smokeless tobacco. She reports that she does not drink alcohol or use illicit drugs.  Allergies:  Allergies  Allergen Reactions  . Clindamycin/Lincomycin   . Penicillins     Dg Chest 2 View  12/03/2011  *RADIOLOGY REPORT*  Clinical Data: A second degree heart block.  Preop respiratory exam for pacemaker placement.  CHEST - 2 VIEW  Comparison: 02/05/2011  Findings: Heart size is within normal limits.  Mild tortuosity of the thoracic aorta is again seen.  No mass or lymphadenopathy identified.  Both lungs are clear.  No evidence of pleural effusion.  Surgical clips again seen within the left axilla.  IMPRESSION: Stable exam.  No active disease.  Original Report  Authenticated By: Danae Orleans, M.D.    ROS  As stated in the HPI and negative for all other systems.  Physical Exam  Vitals:Blood pressure 124/57, pulse 54, temperature 97.7 F (36.5 C), temperature source Oral, resp. rate 19, height 5' (1.524 m), weight 71.9 kg (158 lb 8.2 oz), SpO2 96.00%.  Well appearing elderly woman, NAD HEENT: Unremarkable Neck:  No JVD, no thyromegally Lungs:  Clear with no wheezes, rales, or rhonchi.  HEART:  Regular bradycardia, no murmurs, no rubs, no clicks Abd:  Flat, positive bowel sounds, no organomegally, no rebound, no guarding Ext:  2 plus pulses, no edema, no cyanosis, no clubbing Skin:  No rashes no nodules Neuro:  CN II through XII intact, motor grossly intact  Assessment/Plan: 1. Symptomatic 2:1 AV block - this morning her heart rate is in the 30/min range. I have discussed the risks/benefits/goals/expectations of PPM with the patient and she wishes to proceed. This will be scheduled as soon as possible.  Sharlot Gowda TaylorMD 12/04/2011, 7:10 AM

## 2011-12-04 NOTE — Progress Notes (Signed)
Pt smokes 1 ppd and has quit several times in the past however she's in pre-contemplation stage now and says at my age I want to smoke. Pt is in precontemplation stage and verbalizes understanding of the risk factors. Did give pt educational materials and contact info in case of future interest.

## 2011-12-04 NOTE — Progress Notes (Signed)
Subjective:  No dizziness overnight. Mild dizziness this am assoc with recurrent 2:1 block.  Objective:  Vital Signs in the last 24 hours: BP 110/48  Pulse 33  Temp(Src) 97.8 F (36.6 C) (Oral)  Resp 18  Ht 5' (1.524 m)  Wt 71.9 kg (158 lb 8.2 oz)  BMI 30.96 kg/m2  SpO2 97%  Physical Exam: Pleasant white female in NAD Lungs:  Clear to A&P Cardiac:  Regular rhythm, normal S1 and S2, no S3 Extremities:  No edema present  Intake/Output from previous day: 12/25 0701 - 12/26 0700 In: 310 [P.O.:310] Out: 650 [Urine:650]  Lab Results: Basic Metabolic Panel:  Basename 12/04/11 0500 12/02/11 2206  NA 139 140  K 3.6 3.3*  CL 102 105  CO2 27 23  GLUCOSE 110* 188*  BUN 27* 16  CREATININE 1.00 0.86    CBC:  Basename 12/02/11 2206 12/02/11 1816  WBC 5.7 6.6  NEUTROABS 2.7 3.3  HGB 13.6 13.9  HCT 39.9 41.2  MCV 93.9 93.8  PLT 131* 136*    PROTIME: Lab Results  Component Value Date   INR 1.03 12/02/2011    Telemetry: Reviewed  In sinus overnight.  No heart block overnight.  Assessment/Plan:  1. 2:1 heart block 2. RBBB/LAD 3. COPD 4. Hypokalemia  Rec:   NPO  For possible pacer insertion. Replete K.     Darden Palmer.  MD [facility] 12/04/2011, 9:01 AM

## 2011-12-04 NOTE — Progress Notes (Signed)
Hr increased to 140-150's. Patient denied any symptoms. BP 94/56- 96/61 right arm. An EKG was done and results called. Hr did decreased to 90's after a few minutes. Labs ordered for in the morning. Will continue to monitor and if episodes reoccurs will notify Dr. Mayford Knife again.

## 2011-12-05 ENCOUNTER — Encounter (HOSPITAL_COMMUNITY): Payer: Self-pay | Admitting: Cardiology

## 2011-12-05 ENCOUNTER — Other Ambulatory Visit: Payer: Self-pay

## 2011-12-05 ENCOUNTER — Inpatient Hospital Stay (HOSPITAL_COMMUNITY): Payer: Medicare Other

## 2011-12-05 DIAGNOSIS — Z950 Presence of cardiac pacemaker: Secondary | ICD-10-CM

## 2011-12-05 HISTORY — DX: Presence of cardiac pacemaker: Z95.0

## 2011-12-05 LAB — BASIC METABOLIC PANEL
BUN: 22 mg/dL (ref 6–23)
CO2: 22 mEq/L (ref 19–32)
Chloride: 104 mEq/L (ref 96–112)
Creatinine, Ser: 0.79 mg/dL (ref 0.50–1.10)
Glucose, Bld: 123 mg/dL — ABNORMAL HIGH (ref 70–99)
Potassium: 4.1 mEq/L (ref 3.5–5.1)

## 2011-12-05 MED ORDER — SODIUM CHLORIDE 0.9 % IV SOLN
INTRAVENOUS | Status: DC
  Administered 2011-12-05: 15 mL/h via INTRAVENOUS

## 2011-12-05 MED ORDER — DEXTROSE 5 % IV SOLN
5.0000 mg/h | INTRAVENOUS | Status: DC
  Administered 2011-12-05: 5 mg/h via INTRAVENOUS
  Filled 2011-12-05: qty 100

## 2011-12-05 MED ORDER — DILTIAZEM HCL ER COATED BEADS 180 MG PO CP24: 180.0000 mg | ORAL_CAPSULE | Freq: Every day | ORAL | Status: DC

## 2011-12-05 NOTE — Progress Notes (Signed)
Pt has ambulated multiple times around the unit with family members on RA.  Tolerated well, no SOB, no breaks, steady gait, VSS, no complaints.  Will continue to monitor and encourage. Ave Filter

## 2011-12-05 NOTE — Discharge Summary (Signed)
Physician Discharge Summary  Patient ID: Maria Washington MRN: [id number] DOB/AGE: Sep 27, 1929 77 y.o.  Admit date: 12/02/2011 Discharge date: 12/05/2011  Primary Discharge Diagnosis: 1. Second degree and complete heart block  Secondary Discharge Diagnosis: 2. Hypertension 3. Hypothyroidism 4. History of breast cancer  5. COPD 6. Intermittent atrial fibrillation  Significant Diagnostic Studies:  2-D echocardiogram  Consults:  Dr. Sharrell Ku  Procedures: Insertion of permanent dual chamber pacemaker by Dr. Renee Rival Course: This 77 year old female had had some episodic dizziness for a week or so. On Christmas Eve her daughter stated that she was dizzy and noticed her to have a pulse rate of 40 when they checked her blood pressure. She was brought to the emergency room where she was found to have 2:1 heart block and was admitted to the hospital for further evaluation.  Initial EKG showed regular branch block, left axis deviation, and 2:1 heart block.. She was admitted to the step down unit and was monitored overnight. She did have some additional episodes of 21 heart block. Dr. Sharrell Ku saw her in consultation and felt as if she would benefit from a permanent pacemaker as she was not on any medicines prior to admission that would call scar block. He was felt that she had age-related conduction system disease. A chest x-ray showed some mild COPD.  She underwent placement of a permanent Medtronic pacemaker done by Dr. Ladona Ridgel. She had an atrial and ventricular lead model 5076 placed and a [place] generator. The procedure was complicated by the development of an air embolus that occurred during the atrial lead insertion when she took a deep inspiration and air came in through the sheath. She had transient hypoxemia that resolved. In the recovery area she had some transient diarrhea as well as nausea that eventually resolved.  The evening following the pacemaker insertion she had some  intermittent rapid atrial fibrillation was placed on diltiazem drip. She was feeling better the next morning and the diltiazem drip was discontinued and she was ambulatory in the hall and was feeling fine. She complained of some mild soreness up the left side of her neck as well as some mild bruising in that area. Her laboratory work was stable the day of discharge. Her chest x-ray showed no evidence of pneumothorax. Telemetry off diltiazem showed sinus rhythm. She was to be seen by Dr. Ladona Ridgel the afternoon of discharge and if stable would be able to go home. In addition she was to go home we'll diltiazem 180 mg prior to discharge. Discharge Exam: Blood pressure 113/49, pulse 69, temperature 99.3 F (37.4 C), temperature source Oral, resp. rate 16, height 5' (1.524 m), weight 69.355 kg (152 lb 14.4 oz), SpO2 97.00%.   Lungs clear. Cardiac exam no rub. There is slight tenderness of her left neck. The pacemaker site was clean without hematoma.  Labs: CBC:   Lab Results  Component Value Date   WBC 5.7 12/02/2011   HGB 13.6 12/02/2011   HCT 39.9 12/02/2011   MCV 93.9 12/02/2011   PLT 131* 12/02/2011   CMP:  Lab 12/05/11 0520 12/02/11 2206  NA 140 --  K 4.1 --  CL 104 --  CO2 22 --  BUN 22 --  CREATININE 0.79 --  CALCIUM 9.3 --  PROT -- 6.6  BILITOT -- 0.4  ALKPHOS -- 69  ALT -- 12  AST -- 12  GLUCOSE 123* --   Cardiac Enzymes:  Basename 12/02/11 1603  CKTOTAL 101  CKMB 3.4  CKMBINDEX --  TROPONINI <0.30    Radiology: Postoperative chest x-ray showed no evidence of pneumothorax and clear lung fields EKG: On admission showed 2-1 heart block with right bundle branch block and left axis deviation. The right bundle branch block resolved on a subsequent EKG.  Discharge Medications:  Jazel, N[place]  Home Medication Instructions [id number]   Printed on:12/05/11 1327  Medication Information                    vitamin B-12 (CYANOCOBALAMIN) 1000 MCG tablet Take 1,000 mcg by  mouth daily.             diclofenac sodium (VOLTAREN) 1 % GEL Apply 1 application topically 4 (four) times daily as needed. For pain.            benazepril (LOTENSIN) 20 MG tablet Take 20 mg by mouth daily.             triamterene-hydrochlorothiazide (MAXZIDE-25) 37.5-25 MG per tablet Take 1 tablet by mouth daily.             folic acid (FOLVITE) 1 MG tablet Take 1 mg by mouth daily.             levothyroxine (SYNTHROID, LEVOTHROID) 125 MCG tablet Take 125 mcg by mouth every morning. On an empty stomach            Alum & Mag Hydroxide-Simeth (MAGIC MOUTHWASH) SOLN Take 5 mLs by mouth every 5 (five) hours as needed. Gargle and swallow            celecoxib (CELEBREX) 200 MG capsule Take 200 mg by mouth daily.             tolterodine (DETROL LA) 4 MG 24 hr capsule Take 4 mg by mouth daily.             Omega-3 Fatty Acids (FISH OIL) 1200 MG CAPS Take 1 capsule by mouth 2 (two) times daily.             calcium-vitamin D (OSCAL WITH D) 500-200 MG-UNIT per tablet Take 1 tablet by mouth 2 (two) times daily.             potassium chloride SA (K-DUR,KLOR-CON) 20 MEQ tablet Take 20 mEq by mouth 2 (two) times daily.             furosemide (LASIX) 40 MG tablet Take 40 mg by mouth every other day.             diltiazem (CARDIZEM CD) 180 MG 24 hr capsule Take 1 capsule (180 mg total) by mouth daily.             Followup plans and appointments: Patient is to follow up Dr. Donnie Aho in one week. She is to follow up Dr. Lubertha Basque office for a wound check and one week and is to see Dr. Ladona Ridgel in about 3 months. She is to call if there are any recurrent problems.     Signed: Darden Palmer. MD [facility] 12/05/2011, 1:27 PM

## 2011-12-05 NOTE — Progress Notes (Signed)
Patient ID: Maria Washington, female   DOB: 1929-11-05, 77 y.o.   MRN: [id number] Subjective:  No c/p or sob   Objective:  Vital Signs in the last 24 hours: Temp:  [97.7 F (36.5 C)-99.3 F (37.4 C)] 97.7 F (36.5 C) (12/27 1354) Pulse Rate:  [68-133] 68  (12/27 1354) Resp:  [16-18] 18  (12/27 1354) BP: (88-130)/(48-67) 88/57 mmHg (12/27 1354) SpO2:  [96 %-98 %] 98 % (12/27 1354) Weight:  [69.355 kg (152 lb 14.4 oz)] 152 lb 14.4 oz (69.355 kg) (12/27 0502)  Intake/Output from previous day: 12/26 0701 - 12/27 0700 In: 327.8 [P.O.:30; I.V.:297.8] Out: 500 [Urine:500] Intake/Output from this shift:    Physical Exam: Well appearing NAD HEENT: Unremarkable Neck:  No JVD, no thyromegally Lymphatics:  No adenopathy Back:  No CVA tenderness Lungs:  Clear. No hematoma HEART:  Regular rate rhythm, no murmurs, no rubs, no clicks Abd:  Flat, positive bowel sounds, no organomegally, no rebound, no guarding Ext:  2 plus pulses, no edema, no cyanosis, no clubbing Skin:  No rashes no nodules Neuro:  CN II through XII intact, motor grossly intact  Lab Results:  [facility] 12/02/11 2206 12/02/11 1816  WBC 5.7 6.6  HGB 13.6 13.9  PLT 131* 136*    Basename 12/05/11 0520 12/04/11 0500  NA 140 139  K 4.1 3.6  CL 104 102  CO2 22 27  GLUCOSE 123* 110*  BUN 22 27*  CREATININE 0.79 1.00   No results found for this basename: TROPONINI:2,CK,MB:2 in the last 72 hours Hepatic Function Panel  Basename 12/02/11 2206  PROT 6.6  ALBUMIN 3.3*  AST 12  ALT 12  ALKPHOS 69  BILITOT 0.4  BILIDIR --  IBILI --   No results found for this basename: CHOL in the last 72 hours No results found for this basename: PROTIME in the last 72 hours  Imaging: Dg Chest 2 View  12/05/2011  *RADIOLOGY REPORT*  Clinical Data: New pacemaker  CHEST - 2 VIEW  Comparison: 12/03/2011  Findings: Lungs are clear. No pleural effusion or pneumothorax.  Cardiomediastinal silhouette is within normal limits.  Left  subclavian dual lead pacemaker in satisfactory position.  Degenerative changes of the visualized thoracolumbar spine.  Surgical clips overlying the left axilla.  IMPRESSION: Left subclavian dual lead pacemaker in satisfactory position.  Original Report Authenticated By: Charline Bills, M.D.    Cardiac Studies: NSR with atrial pacing. Assessment/Plan:  1.symptomatic brady 2. S/P PPM 3. COPD  Plan - usual followup  - I asked her to stop smoking  -continue current meds.  LOS: 3 days    Lewayne Bunting 12/05/2011, 4:23 PM

## 2011-12-05 NOTE — Op Note (Signed)
Maria Washington, Maria Washington NO.:  [id number]  MEDICAL RECORD NO.:  [id number]  LOCATION:  2020                         FACILITY:  [facility]  PHYSICIAN:  Doylene Canning. Ladona Ridgel, MD    DATE OF BIRTH:  [birth year]  DATE OF PROCEDURE:  12/04/2011 DATE OF DISCHARGE:                              OPERATIVE REPORT   PROCEDURE PERFORMED:  Insertion of a dual-chamber pacemaker.  INDICATION:  Symptomatic 2:1 heart block.  INTRODUCTION: The patient is an 77 year old woman who has a history of symptomatic bradycardia and dizziness associated with 2:1 heart block.  She is admitted to the hospital and is now referred for permanent pacemaker insertion.  PROCEDURE:  After informed consent was obtained, the patient was taken to the diagnostic EP lab in a fasting state.  After usual preparation and draping, intravenous fentanyl and midazolam was given for sedation. Lidocaine 30 mL was infiltrated into the left infraclavicular region.  A 5 cm incision was carried out over this region and electrocautery was utilized to dissect down to the fascial plane.  The left subclavian vein was then punctured x2 and the Medtronic, model 5076, 52-cm active fixation pacing lead, serial number [id number] was advanced in the right ventricle and a Medtronic model 5076, 45-cm active fixation pacing lead, serial number [id number] was advanced in the right atrium.  It should be noted that with insertion of the atrial lead, the patient had a deep heavy inspiration and sucked air in through her sheath and out into the pulmonary outflow tract.  While she had no hemodynamic compromise from this, she did sustained transient hypoxemia with oxygen saturations decreasing from the high 90s down into the mid 80s.  This resolved with supplemental oxygen, and time.  The RV lead was then manipulated into the right ventricle and at the RV septum, the R-waves measured 16 mV, the pacing impedance was 800 ohms with lead  actively fixed and the threshold was a V at 0.5 msec.  There was a large current of injury.  There was no diaphragmatic stimulation.  With the ventricular lead in satisfactory position, attention was then turned to placement of the atrial lead which was placed in anterolateral portion of the right atrium where P-waves measured 2 mV, the pacing impedance was 700 ohms, and the threshold was 1.5 V at 0.5 msec.  Again, there was a large injury current with active fixation of the lead and again, there was no diaphragmatic stimulation at 10 V.  With these satisfactory parameters, the leads were secured to the subpectoral fascia with a figure-of-eight silk suture.  Sewing sleeve was secured with silk suture.  Electrocautery was utilized to make subcutaneous pocket. Antibiotic irrigation was utilized to irrigate the pocket, and electrocautery was utilized to assure hemostasis.  The Medtronic Sensia dual-chamber pacemaker, serial number [id number] was connected to the atrial and the RV leads and placed back in the subcutaneous pocket.  The generator was secured with a silk suture.  The pocket was irrigated with antibiotic irrigation.  The incision closed with 2-0 and 3-0 Vicryl. Benzoin and Steri-Strips were painted on the skin, pressure dressing was applied, and the patient was returned to  her room in satisfactory condition.  COMPLICATIONS:  There were no immediate procedure complications.  RESULTS:  Demonstrate successful implantation of a Medtronic dual- chamber pacemaker.  The patient's procedure was complicated by transient hypoxemia which resolved within a few minutes.     Doylene Canning. Ladona Ridgel, MD     GWT/MEDQ  D:  12/04/2011  T:  12/05/2011  Job:  [id number]

## 2011-12-05 NOTE — Progress Notes (Signed)
Subjective:  Had nausea yesterday post pacer insertion.  Evidently had mild air emoblus with atrial sheath inspiration with reduced sats.  No nausea today but did not eat much yesterday.  Had a fib last night.  Objective:  Vital Signs in the last 24 hours: BP 113/49  Pulse 69  Temp(Src) 99.3 F (37.4 C) (Oral)  Resp 16  Ht 5' (1.524 m)  Wt 69.355 kg (152 lb 14.4 oz)  BMI 29.86 kg/m2  SpO2 97%  Physical Exam: Pleasant white female in NAD Lungs:  Clear to A&P Cardiac:  Regular rhythm, normal S1 and S2, no S3 Extremities:  No edema present  Intake/Output from previous day: 12/26 0701 - 12/27 0700 In: 327.8 [P.O.:30; I.V.:297.8] Out: 500 [Urine:500]  Lab Results: Basic Metabolic Panel:  Basename 12/05/11 0520 12/04/11 0500  NA 140 139  K 4.1 3.6  CL 104 102  CO2 22 27  GLUCOSE 123* 110*  BUN 22 27*  CREATININE 0.79 1.00    CBC:  Basename 12/02/11 2206 12/02/11 1816  WBC 5.7 6.6  NEUTROABS 2.7 3.3  HGB 13.6 13.9  HCT 39.9 41.2  MCV 93.9 93.8  PLT 131* 136*    PROTIME: Lab Results  Component Value Date   INR 1.03 12/02/2011    Telemetry: Episodes of a fib last night rapid response.   EKG: RBBB resolved.  Anterior T ave changes seen.  CXR: No pneumothorax.   Assessment/Plan:  1. Paroxysmal a fib post pacer insertion 2. Mild air embolus with pacer insertion  With transient hypoxemia 3.  Nausea resolved.  Rec: Lab OK.  D/c Cardizem and walk in hall.  Discuss with Dr. Ladona Ridgel about timing of d/c, ? Home later.   Maria Washington.  MD [facility] 12/05/2011, 9:27 AM

## 2011-12-05 NOTE — Progress Notes (Signed)
Called about order clarification. Would like to start cardizem at 5 and leave it at 5.

## 2011-12-12 ENCOUNTER — Ambulatory Visit (INDEPENDENT_AMBULATORY_CARE_PROVIDER_SITE_OTHER): Payer: Medicare Other | Admitting: *Deleted

## 2011-12-12 ENCOUNTER — Encounter: Payer: Self-pay | Admitting: Internal Medicine

## 2011-12-12 DIAGNOSIS — Z950 Presence of cardiac pacemaker: Secondary | ICD-10-CM

## 2011-12-12 DIAGNOSIS — I441 Atrioventricular block, second degree: Secondary | ICD-10-CM

## 2011-12-12 LAB — PACEMAKER DEVICE OBSERVATION
AL THRESHOLD: 0.75 V
ATRIAL PACING PM: 20
BAMS-0001: 150 {beats}/min
RV LEAD THRESHOLD: 0.5 V
VENTRICULAR PACING PM: 38

## 2011-12-12 NOTE — Progress Notes (Signed)
Wound check pacer in clinic  

## 2011-12-19 ENCOUNTER — Ambulatory Visit: Payer: Medicare Other | Admitting: *Deleted

## 2012-01-13 ENCOUNTER — Encounter: Payer: Self-pay | Admitting: Internal Medicine

## 2012-01-13 ENCOUNTER — Ambulatory Visit (INDEPENDENT_AMBULATORY_CARE_PROVIDER_SITE_OTHER): Payer: Medicare Other | Admitting: Internal Medicine

## 2012-01-13 DIAGNOSIS — Z950 Presence of cardiac pacemaker: Secondary | ICD-10-CM

## 2012-01-13 DIAGNOSIS — I441 Atrioventricular block, second degree: Secondary | ICD-10-CM

## 2012-01-13 DIAGNOSIS — I4891 Unspecified atrial fibrillation: Secondary | ICD-10-CM

## 2012-01-13 DIAGNOSIS — I480 Paroxysmal atrial fibrillation: Secondary | ICD-10-CM | POA: Insufficient documentation

## 2012-01-13 DIAGNOSIS — I10 Essential (primary) hypertension: Secondary | ICD-10-CM

## 2012-01-13 LAB — PACEMAKER DEVICE OBSERVATION
AL IMPEDENCE PM: 417 Ohm
ATRIAL PACING PM: 32.5
BAMS-0001: 150 {beats}/min
BRDY-0002RV: 60 {beats}/min
RV LEAD AMPLITUDE: 11.2 mv
RV LEAD IMPEDENCE PM: 503 Ohm
VENTRICULAR PACING PM: 99.9

## 2012-01-13 MED ORDER — DABIGATRAN ETEXILATE MESYLATE 75 MG PO CAPS: 75.0000 mg | ORAL_CAPSULE | Freq: Two times a day (BID) | ORAL | Status: DC

## 2012-01-13 NOTE — Assessment & Plan Note (Signed)
Today she has symptoms of orthostasis. I've asked that she stop Maxide.

## 2012-01-13 NOTE — Assessment & Plan Note (Signed)
She is rhythm approximately 2% of the time. She will start anticoagulation today with Pradaxa 75 mg twice daily.

## 2012-01-13 NOTE — Patient Instructions (Addendum)
Your physician recommends that you schedule a follow-up appointment as needed  Your physician has recommended you make the following change in your medication:  1) Start Pradaxa 75mg  twice daily-- need to get subsequent refills from Dr Donnie Aho  2) Stop Maxide

## 2012-01-13 NOTE — Progress Notes (Signed)
HPI [name] returns today for followup. She is an 78 year old woman with symptomatic bradycardia status post permanent pacemaker insertion. She has a history of hypertension. Recently she's been bothered by orthostasis. She has not had frank syncope however. She denies chest pain, shortness of breath, or peripheral edema. She has palpitations. These are minimally symptomatic. Allergies  Allergen Reactions  . Clindamycin/Lincomycin   . Penicillins      Current Outpatient Prescriptions  Medication Sig Dispense Refill  . Alum & Mag Hydroxide-Simeth (MAGIC MOUTHWASH) SOLN Take 5 mLs by mouth every 5 (five) hours as needed. Gargle and swallow       . benazepril (LOTENSIN) 20 MG tablet Take 20 mg by mouth daily.        Marland Kitchen BIOTIN 5000 PO Take 1 tablet by mouth daily.        . calcium-vitamin D (OSCAL WITH D) 500-200 MG-UNIT per tablet Take 1 tablet by mouth 2 (two) times daily.        . celecoxib (CELEBREX) 200 MG capsule Take 200 mg by mouth daily.        . diclofenac sodium (VOLTAREN) 1 % GEL Apply 1 application topically 4 (four) times daily as needed. For pain.       Marland Kitchen diltiazem (CARDIZEM CD) 180 MG 24 hr capsule Take 1 capsule (180 mg total) by mouth daily.  30 capsule  12  . folic acid (FOLVITE) 1 MG tablet Take 1 mg by mouth daily.        . furosemide (LASIX) 40 MG tablet Take 40 mg by mouth every other day.        . levothyroxine (SYNTHROID, LEVOTHROID) 125 MCG tablet Take 125 mcg by mouth every morning. On an empty stomach       . Omega-3 Fatty Acids (FISH OIL) 1200 MG CAPS Take 1 capsule by mouth 2 (two) times daily.        . potassium chloride SA (K-DUR,KLOR-CON) 20 MEQ tablet Take 20 mEq by mouth 2 (two) times daily.        Marland Kitchen tolterodine (DETROL LA) 4 MG 24 hr capsule Take 4 mg by mouth daily.        Marland Kitchen triamterene-hydrochlorothiazide (MAXZIDE-25) 37.5-25 MG per tablet Take 1 tablet by mouth daily.        . vitamin B-12 (CYANOCOBALAMIN) 1000 MCG tablet Take 1,000 mcg by mouth daily.            Past Medical History  Diagnosis Date  . Hypertension   . Hypothyroid   . History of breast cancer   . Second degree heart block   . Heart murmur   . Cancer     left breast cA  . Arthritis   . COPD (chronic obstructive pulmonary disease)     spot on lung  . Anemia     after giving birth  . Cardiac pacemaker in situ 12/05/2011    ROS:   All systems reviewed and negative except as noted in the HPI.   Past Surgical History  Procedure Date  . Shoulder surgery   . Finger surgery   . Cholecystectomy   . Tonsillectomy and adenoidectomy   . Abdominal hysterectomy   . Breast biopsy     lumpectomy  . Carpel tunnel surgery     left wrist     Family History  Problem Relation Age of Onset  . Cancer Father   . Heart attack Mother   . Cancer Brother      History  Social History  . Marital Status: Widowed    Spouse Name: N/A    Number of Children: N/A  . Years of Education: N/A   Occupational History  . Not on file.   Social History Main Topics  . Smoking status: Current Everyday Smoker -- 1.0 packs/day for 60 years    Types: Cigarettes  . Smokeless tobacco: Never Used  . Alcohol Use: No  . Drug Use: No  . Sexually Active: Not on file   Other Topics Concern  . Not on file   Social History Narrative   Widow.  Has 22 dogs.     BP 104/56  Pulse 89  Ht 5' (1.524 m)  Wt 71.215 kg (157 lb)  BMI 30.66 kg/m2  Physical Exam:  Well appearing elderly woman, NAD HEENT: Unremarkable Neck:  No JVD, no thyromegally Lungs:  Clear with no wheezes, rales, or rhonchi. Well-healed pacemaker incision. HEART:  Regular rate rhythm, no murmurs, no rubs, no clicks Abd:  soft, positive bowel sounds, no organomegally, no rebound, no guarding Ext:  2 plus pulses, no edema, no cyanosis, no clubbing Skin:  No rashes no nodules Neuro:  CN II through XII intact, motor grossly intact  DEVICE  Normal device function.  See PaceArt for details. Atrial fibrillation is  present approximately 2% of the time. Longest episode approximately 9 hours.  Assess/Plan:

## 2012-01-13 NOTE — Assessment & Plan Note (Signed)
Her device is working normally. She will continue pacemaker followup with her primary cardiologist.

## 2012-01-27 ENCOUNTER — Other Ambulatory Visit: Payer: Self-pay | Admitting: Internal Medicine

## 2012-01-27 DIAGNOSIS — I4891 Unspecified atrial fibrillation: Secondary | ICD-10-CM

## 2012-01-27 MED ORDER — DABIGATRAN ETEXILATE MESYLATE 75 MG PO CAPS: 75.0000 mg | ORAL_CAPSULE | Freq: Two times a day (BID) | ORAL | Status: DC

## 2012-01-27 NOTE — Telephone Encounter (Signed)
New Refill   Patient was suppose to get a 90 day supply from [facility],  I verified this as preferred pharmacy and made changes in patient file.  Please resend an order to new changed [facility] for 90 day supply as [facility] [address].   Patient can be reached at hm# should you need any additional info.

## 2012-02-06 ENCOUNTER — Other Ambulatory Visit: Payer: Medicare Other | Admitting: Lab

## 2012-02-06 ENCOUNTER — Ambulatory Visit (HOSPITAL_BASED_OUTPATIENT_CLINIC_OR_DEPARTMENT_OTHER): Payer: Medicare Other | Admitting: Oncology

## 2012-02-06 ENCOUNTER — Encounter: Payer: Self-pay | Admitting: Oncology

## 2012-02-06 VITALS — BP 133/79 | HR 71 | Temp 98.0°F | Ht 60.0 in | Wt 159.3 lb

## 2012-02-06 DIAGNOSIS — C50319 Malignant neoplasm of lower-inner quadrant of unspecified female breast: Secondary | ICD-10-CM

## 2012-02-06 DIAGNOSIS — R220 Localized swelling, mass and lump, head: Secondary | ICD-10-CM

## 2012-02-06 DIAGNOSIS — C50912 Malignant neoplasm of unspecified site of left female breast: Secondary | ICD-10-CM

## 2012-02-06 LAB — CBC WITH DIFFERENTIAL/PLATELET
Basophils Absolute: 0 10*3/uL (ref 0.0–0.1)
Eosinophils Absolute: 0.2 10*3/uL (ref 0.0–0.5)
HCT: 42.2 % (ref 34.8–46.6)
HGB: 14.2 g/dL (ref 11.6–15.9)
MCH: 31.3 pg (ref 25.1–34.0)
MCV: 93 fL (ref 79.5–101.0)
MONO%: 9.9 % (ref 0.0–14.0)
NEUT#: 2.8 10*3/uL (ref 1.5–6.5)
NEUT%: 51.6 % (ref 38.4–76.8)
RDW: 14.4 % (ref 11.2–14.5)

## 2012-02-06 LAB — COMPREHENSIVE METABOLIC PANEL
Albumin: 4 g/dL (ref 3.5–5.2)
Alkaline Phosphatase: 71 U/L (ref 39–117)
BUN: 17 mg/dL (ref 6–23)
Calcium: 9.3 mg/dL (ref 8.4–10.5)
Chloride: 110 mEq/L (ref 96–112)
Creatinine, Ser: 0.98 mg/dL (ref 0.50–1.10)
Glucose, Bld: 121 mg/dL — ABNORMAL HIGH (ref 70–99)
Potassium: 3.5 mEq/L (ref 3.5–5.3)

## 2012-02-06 NOTE — Progress Notes (Signed)
CC:   Maria Washington, M.D. Maria Washington. Maria Washington, M.D. Maria Washington, M.D.  PROBLEM LIST: 1. Invasive ductal carcinoma of the left breast with a 2.8 cm primary,     T2 N1, stage IIB with 1/19 positive lymph nodes, positive hormone     receptors and negative HER-2/neu with diagnosis going back to     October 2002.  Maria Washington received 4 cycles of adjuvant Adriamycin and     Cytoxan following lumpectomy.  These treatments were given from     10/29/2001 through February 2003.  She then received radiation     treatments to the left breast, approximately 6000 cGy including a     boost given from 02/25/2002 through 04/13/2002.  The patient then     received adjuvant tamoxifen from 03/06/2002 until January 2005 and     then subsequently Arimidex until June 2008.  She remains disease     free. 2. Placement of a left-sided pacemaker on 12/04/2011 for bradycardia. 3. History of intermittent mild thrombocytopenia. 4. Hypertension. 5. History of hypothyroidism. 6. History of low vitamin B12 levels. 7. Irritable bowel syndrome. 8. History of shingles in October 2006.  MEDICATIONS:  Medicines were reviewed and recorded.  The patient is on medicines for hypertension.  She takes Lasix every other day.  She was started on Pradaxa 75 mg every 12 hours since late December 2012.  HISTORY:  Maria Washington is seen for followup of her history of an invasive ductal carcinoma of the left breast dating back to October 2002.  Maria Washington remains without any symptoms or signs of recurrent disease.  She was last seen by [place] on 08/06/2011.  I see her frequently when she accompanies her brother to his office visits.  Maria Washington is without complaints today.  At one point, she had some swelling of her left upper extremity but that seems to have resolved.  She is up to date with her bilateral screening mammograms last obtained on 10/08/2011.  PHYSICAL EXAM:  General:  Maria Washington looks well.  Recently turned 83.  Vital Signs:  Weight is  159.3 pounds.  Height 5 feet even.  Body Surface Area 1.75 sq m.  Blood pressure 133/79.  Other vital signs are normal.  Pulse is regular.  HEENT:  There is no scleral icterus.  Mouth and pharynx are benign.  No peripheral adenopathy palpable.  No longer any left submandibular swelling.  Heart/lungs:  Normal.  There is a pacemaker in the left infraclavicular area.  Breasts:  Right breast is benign, soft, nipple is flat.  Left breast continues to show distortion with deep inversion of the left nipple-areolar complex but no suspicious findings or changes.  No axillary adenopathy.  Abdomen:  Somewhat obese, nontender with no organomegaly masses palpable.  Extremities:  Quite puffy, swollen ankles.  Skin:  Remains dry.  Lymphatic:  No obvious lymphedema of the left arm.  Musculoskeletal:  No areas of musculoskeletal pain.  Neurologic:  Grossly normal.  LABORATORY DATA:  Today, white count 5.3, ANC 2.8, hemoglobin 14.2, hematocrit 42.2, platelets 141,000 as compared with 156,000 on 08/06/2011 and 167,000 on 02/05/2011.  CHEMISTRIES:  Chemistries today are pending.  Chemistries from 08/06/2011 were normal except for a minimally elevated glucose of 105.  IMAGING STUDIES: 1. Bone density scan on Mar 08, 2011 showed left femoral neck with a T-     score of -0.9.  The left forearm had a T-score of -2.6 diagnostic     of osteoporosis by WHO criteria.  2.  Digital  screening mammogram     with CAD on 10/07/2011 was negative.  3.  Chest x-ray, two-view,     from 12/05/2011 showed cardiomediastinal silhouette within normal     limits.  There was a left subclavian dual-lead pacemaker in     satisfactory position.  There were degenerative changes in the     thoracic thoracal lumbar spine.  There were surgical clips     overlying the left axilla.  IMPRESSION AND PLAN:  Maria Washington continues to do well now, over 10 years from the time of diagnosis of her invasive ductal cancer involving the left breast.   There is no evidence for recurrent disease.  Maria Washington is doing well.  I told her she really did not need to continue seeing me although I would certainly be happy to follow her on a yearly basis. Maria Washington will return on an as-needed basis.  It has been my pleasure seeing her these past 10 years.  Maria Washington understands that she needs to continue with yearly mammograms.    ______________________________ Samul Dada, M.D. DSM/MEDQ  D:  02/06/2012  T:  02/06/2012  Job:  [id number]

## 2012-02-06 NOTE — Progress Notes (Signed)
This office note has been dictated.  #[id number]

## 2012-08-31 ENCOUNTER — Other Ambulatory Visit: Payer: Self-pay | Admitting: Oncology

## 2012-08-31 DIAGNOSIS — Z1231 Encounter for screening mammogram for malignant neoplasm of breast: Secondary | ICD-10-CM

## 2012-10-07 ENCOUNTER — Ambulatory Visit
Admission: RE | Admit: 2012-10-07 | Discharge: 2012-10-07 | Disposition: A | Discharge status: Home / Self Care | Payer: Medicare Other | Source: Ambulatory Visit | Attending: Oncology | Admitting: Oncology

## 2012-10-07 DIAGNOSIS — Z1231 Encounter for screening mammogram for malignant neoplasm of breast: Secondary | ICD-10-CM

## 2013-03-02 ENCOUNTER — Other Ambulatory Visit: Payer: Self-pay | Admitting: Dermatology

## 2013-04-14 ENCOUNTER — Other Ambulatory Visit: Payer: Self-pay | Admitting: Dermatology

## 2013-08-31 ENCOUNTER — Other Ambulatory Visit: Payer: Self-pay

## 2013-08-31 DIAGNOSIS — Z9889 Other specified postprocedural states: Secondary | ICD-10-CM

## 2013-08-31 DIAGNOSIS — Z1231 Encounter for screening mammogram for malignant neoplasm of breast: Secondary | ICD-10-CM

## 2013-08-31 DIAGNOSIS — Z853 Personal history of malignant neoplasm of breast: Secondary | ICD-10-CM

## 2013-10-11 ENCOUNTER — Ambulatory Visit
Admission: RE | Admit: 2013-10-11 | Discharge: 2013-10-11 | Disposition: A | Discharge status: Home / Self Care | Payer: Medicare Other | Source: Ambulatory Visit

## 2013-10-11 DIAGNOSIS — Z853 Personal history of malignant neoplasm of breast: Secondary | ICD-10-CM

## 2013-10-11 DIAGNOSIS — Z1231 Encounter for screening mammogram for malignant neoplasm of breast: Secondary | ICD-10-CM

## 2013-10-11 DIAGNOSIS — Z9889 Other specified postprocedural states: Secondary | ICD-10-CM

## 2014-02-07 ENCOUNTER — Other Ambulatory Visit (HOSPITAL_COMMUNITY): Payer: Self-pay | Admitting: Geriatric Medicine

## 2014-02-07 DIAGNOSIS — Z853 Personal history of malignant neoplasm of breast: Secondary | ICD-10-CM

## 2014-02-16 ENCOUNTER — Encounter (HOSPITAL_COMMUNITY)
Admission: RE | Admit: 2014-02-16 | Discharge: 2014-02-16 | Disposition: A | Discharge status: Home / Self Care | Payer: Medicare Other | Source: Ambulatory Visit | Attending: Geriatric Medicine | Admitting: Geriatric Medicine

## 2014-02-16 DIAGNOSIS — Z853 Personal history of malignant neoplasm of breast: Secondary | ICD-10-CM | POA: Insufficient documentation

## 2014-02-16 MED ORDER — TECHNETIUM TC 99M MEDRONATE IV KIT
23.7000 | PACK | Freq: Once | INTRAVENOUS | Status: AC | PRN
  Administered 2014-02-16: 23.7 via INTRAVENOUS

## 2014-04-28 ENCOUNTER — Other Ambulatory Visit: Payer: Self-pay

## 2014-04-28 ENCOUNTER — Encounter (HOSPITAL_COMMUNITY): Payer: Self-pay | Admitting: Emergency Medicine

## 2014-04-28 ENCOUNTER — Emergency Department (HOSPITAL_COMMUNITY): Payer: Medicare Other

## 2014-04-28 ENCOUNTER — Inpatient Hospital Stay (HOSPITAL_COMMUNITY)
Admission: EM | Admit: 2014-04-28 | Discharge: 2014-05-03 | DRG: 871 | Disposition: A | Discharge status: Home / Self Care | Payer: Medicare Other | Attending: Internal Medicine | Admitting: Internal Medicine

## 2014-04-28 DIAGNOSIS — J441 Chronic obstructive pulmonary disease with (acute) exacerbation: Secondary | ICD-10-CM | POA: Diagnosis present

## 2014-04-28 DIAGNOSIS — R197 Diarrhea, unspecified: Secondary | ICD-10-CM

## 2014-04-28 DIAGNOSIS — I10 Essential (primary) hypertension: Secondary | ICD-10-CM

## 2014-04-28 DIAGNOSIS — Z950 Presence of cardiac pacemaker: Secondary | ICD-10-CM

## 2014-04-28 DIAGNOSIS — J449 Chronic obstructive pulmonary disease, unspecified: Secondary | ICD-10-CM

## 2014-04-28 DIAGNOSIS — A419 Sepsis, unspecified organism: Principal | ICD-10-CM | POA: Diagnosis present

## 2014-04-28 DIAGNOSIS — E860 Dehydration: Secondary | ICD-10-CM | POA: Diagnosis present

## 2014-04-28 DIAGNOSIS — Z79899 Other long term (current) drug therapy: Secondary | ICD-10-CM

## 2014-04-28 DIAGNOSIS — I119 Hypertensive heart disease without heart failure: Secondary | ICD-10-CM | POA: Diagnosis present

## 2014-04-28 DIAGNOSIS — Z9089 Acquired absence of other organs: Secondary | ICD-10-CM

## 2014-04-28 DIAGNOSIS — N170 Acute kidney failure with tubular necrosis: Secondary | ICD-10-CM | POA: Diagnosis present

## 2014-04-28 DIAGNOSIS — I5030 Unspecified diastolic (congestive) heart failure: Secondary | ICD-10-CM

## 2014-04-28 DIAGNOSIS — N179 Acute kidney failure, unspecified: Secondary | ICD-10-CM

## 2014-04-28 DIAGNOSIS — R059 Cough, unspecified: Secondary | ICD-10-CM

## 2014-04-28 DIAGNOSIS — D649 Anemia, unspecified: Secondary | ICD-10-CM | POA: Diagnosis present

## 2014-04-28 DIAGNOSIS — I959 Hypotension, unspecified: Secondary | ICD-10-CM

## 2014-04-28 DIAGNOSIS — IMO0002 Reserved for concepts with insufficient information to code with codable children: Secondary | ICD-10-CM

## 2014-04-28 DIAGNOSIS — E876 Hypokalemia: Secondary | ICD-10-CM | POA: Diagnosis present

## 2014-04-28 DIAGNOSIS — J9600 Acute respiratory failure, unspecified whether with hypoxia or hypercapnia: Secondary | ICD-10-CM | POA: Diagnosis present

## 2014-04-28 DIAGNOSIS — I5032 Chronic diastolic (congestive) heart failure: Secondary | ICD-10-CM | POA: Diagnosis present

## 2014-04-28 DIAGNOSIS — J189 Pneumonia, unspecified organism: Secondary | ICD-10-CM | POA: Diagnosis present

## 2014-04-28 DIAGNOSIS — Z8249 Family history of ischemic heart disease and other diseases of the circulatory system: Secondary | ICD-10-CM

## 2014-04-28 DIAGNOSIS — I480 Paroxysmal atrial fibrillation: Secondary | ICD-10-CM | POA: Diagnosis present

## 2014-04-28 DIAGNOSIS — I129 Hypertensive chronic kidney disease with stage 1 through stage 4 chronic kidney disease, or unspecified chronic kidney disease: Secondary | ICD-10-CM | POA: Diagnosis present

## 2014-04-28 DIAGNOSIS — E039 Hypothyroidism, unspecified: Secondary | ICD-10-CM | POA: Diagnosis present

## 2014-04-28 DIAGNOSIS — I4891 Unspecified atrial fibrillation: Secondary | ICD-10-CM | POA: Diagnosis present

## 2014-04-28 DIAGNOSIS — Z880 Allergy status to penicillin: Secondary | ICD-10-CM

## 2014-04-28 DIAGNOSIS — J9601 Acute respiratory failure with hypoxia: Secondary | ICD-10-CM

## 2014-04-28 DIAGNOSIS — Z881 Allergy status to other antibiotic agents status: Secondary | ICD-10-CM

## 2014-04-28 DIAGNOSIS — R05 Cough: Secondary | ICD-10-CM

## 2014-04-28 DIAGNOSIS — Z853 Personal history of malignant neoplasm of breast: Secondary | ICD-10-CM

## 2014-04-28 DIAGNOSIS — F17200 Nicotine dependence, unspecified, uncomplicated: Secondary | ICD-10-CM | POA: Diagnosis present

## 2014-04-28 DIAGNOSIS — I509 Heart failure, unspecified: Secondary | ICD-10-CM | POA: Diagnosis present

## 2014-04-28 DIAGNOSIS — Z7901 Long term (current) use of anticoagulants: Secondary | ICD-10-CM

## 2014-04-28 DIAGNOSIS — N183 Chronic kidney disease, stage 3 unspecified: Secondary | ICD-10-CM | POA: Diagnosis present

## 2014-04-28 DIAGNOSIS — R6520 Severe sepsis without septic shock: Secondary | ICD-10-CM

## 2014-04-28 LAB — CBC WITH DIFFERENTIAL/PLATELET
BASOS PCT: 0 % (ref 0–1)
Basophils Absolute: 0 10*3/uL (ref 0.0–0.1)
Eosinophils Absolute: 0 10*3/uL (ref 0.0–0.7)
Eosinophils Relative: 0 % (ref 0–5)
HCT: 37.7 % (ref 36.0–46.0)
Hemoglobin: 13.1 g/dL (ref 12.0–15.0)
LYMPHS ABS: 0.6 10*3/uL — AB (ref 0.7–4.0)
Lymphocytes Relative: 5 % — ABNORMAL LOW (ref 12–46)
MCH: 31.8 pg (ref 26.0–34.0)
MCHC: 34.7 g/dL (ref 30.0–36.0)
MCV: 91.5 fL (ref 78.0–100.0)
MONO ABS: 0.7 10*3/uL (ref 0.1–1.0)
MONOS PCT: 6 % (ref 3–12)
Neutro Abs: 11.3 10*3/uL — ABNORMAL HIGH (ref 1.7–7.7)
Neutrophils Relative %: 89 % — ABNORMAL HIGH (ref 43–77)
Platelets: 223 10*3/uL (ref 150–400)
RBC: 4.12 MIL/uL (ref 3.87–5.11)
RDW: 14.3 % (ref 11.5–15.5)
WBC: 12.6 10*3/uL — AB (ref 4.0–10.5)

## 2014-04-28 LAB — I-STAT TROPONIN, ED: TROPONIN I, POC: 0.03 ng/mL (ref 0.00–0.08)

## 2014-04-28 LAB — COMPREHENSIVE METABOLIC PANEL
ALBUMIN: 3.1 g/dL — AB (ref 3.5–5.2)
ALK PHOS: 73 U/L (ref 39–117)
ALT: 8 U/L (ref 0–35)
AST: 13 U/L (ref 0–37)
BUN: 13 mg/dL (ref 6–23)
CO2: 20 mEq/L (ref 19–32)
CREATININE: 1.64 mg/dL — AB (ref 0.50–1.10)
Calcium: 9.4 mg/dL (ref 8.4–10.5)
Chloride: 100 mEq/L (ref 96–112)
GFR calc non Af Amer: 28 mL/min — ABNORMAL LOW (ref >90)
GFR, EST AFRICAN AMERICAN: 32 mL/min — AB (ref >90)
GLUCOSE: 144 mg/dL — AB (ref 70–99)
POTASSIUM: 3.9 meq/L (ref 3.7–5.3)
Sodium: 138 mEq/L (ref 137–147)
Total Bilirubin: 0.3 mg/dL (ref 0.3–1.2)
Total Protein: 7.4 g/dL (ref 6.0–8.3)

## 2014-04-28 LAB — URINALYSIS, ROUTINE W REFLEX MICROSCOPIC
BILIRUBIN URINE: NEGATIVE
Glucose, UA: NEGATIVE mg/dL
Hgb urine dipstick: NEGATIVE
KETONES UR: NEGATIVE mg/dL
LEUKOCYTES UA: NEGATIVE
NITRITE: NEGATIVE
PH: 6 (ref 5.0–8.0)
Protein, ur: NEGATIVE mg/dL
Specific Gravity, Urine: 1.017 (ref 1.005–1.030)
UROBILINOGEN UA: 0.2 mg/dL (ref 0.0–1.0)

## 2014-04-28 LAB — MRSA PCR SCREENING: MRSA BY PCR: NEGATIVE

## 2014-04-28 LAB — POC OCCULT BLOOD, ED: Fecal Occult Bld: NEGATIVE

## 2014-04-28 LAB — I-STAT CG4 LACTIC ACID, ED: LACTIC ACID, VENOUS: 2.52 mmol/L — AB (ref 0.5–2.2)

## 2014-04-28 MED ORDER — MORPHINE SULFATE 2 MG/ML IJ SOLN: 1.0000 mg | INTRAMUSCULAR | Status: DC | PRN

## 2014-04-28 MED ORDER — SODIUM CHLORIDE 0.9 % IV SOLN
1000.0000 mL | INTRAVENOUS | Status: DC
  Administered 2014-04-28: 1000 mL via INTRAVENOUS

## 2014-04-28 MED ORDER — DEXTROSE 5 % IV SOLN
500.0000 mg | INTRAVENOUS | Status: AC
  Administered 2014-04-29 – 2014-04-30 (×2): 500 mg via INTRAVENOUS
  Filled 2014-04-28 (×2): qty 500

## 2014-04-28 MED ORDER — DEXTROSE 5 % IV SOLN: 1.0000 g | INTRAVENOUS | Status: DC

## 2014-04-28 MED ORDER — RIVAROXABAN 15 MG PO TABS
15.0000 mg | ORAL_TABLET | Freq: Every day | ORAL | Status: DC
  Administered 2014-04-29 – 2014-05-02 (×4): 15 mg via ORAL
  Filled 2014-04-28 (×5): qty 1

## 2014-04-28 MED ORDER — DEXTROSE 5 % IV SOLN
500.0000 mg | Freq: Once | INTRAVENOUS | Status: AC
  Administered 2014-04-28: 500 mg via INTRAVENOUS

## 2014-04-28 MED ORDER — ACETAMINOPHEN 325 MG PO TABS
650.0000 mg | ORAL_TABLET | Freq: Four times a day (QID) | ORAL | Status: DC | PRN
  Administered 2014-04-28 – 2014-04-29 (×3): 650 mg via ORAL
  Filled 2014-04-28 (×3): qty 2

## 2014-04-28 MED ORDER — ONDANSETRON HCL 4 MG PO TABS: 4.0000 mg | ORAL_TABLET | Freq: Four times a day (QID) | ORAL | Status: DC | PRN

## 2014-04-28 MED ORDER — SODIUM CHLORIDE 0.9 % IV SOLN
1000.0000 mL | Freq: Once | INTRAVENOUS | Status: AC
  Administered 2014-04-28: 1000 mL via INTRAVENOUS

## 2014-04-28 MED ORDER — SODIUM CHLORIDE 0.9 % IJ SOLN
3.0000 mL | Freq: Two times a day (BID) | INTRAMUSCULAR | Status: DC
  Administered 2014-04-28 – 2014-05-03 (×6): 3 mL via INTRAVENOUS

## 2014-04-28 MED ORDER — DEXTROSE 5 % IV SOLN
1.0000 g | INTRAVENOUS | Status: DC
  Administered 2014-04-29 – 2014-05-03 (×5): 1 g via INTRAVENOUS
  Filled 2014-04-28 (×5): qty 10

## 2014-04-28 MED ORDER — ACETAMINOPHEN 650 MG RE SUPP: 650.0000 mg | Freq: Four times a day (QID) | RECTAL | Status: DC | PRN

## 2014-04-28 MED ORDER — DABIGATRAN ETEXILATE MESYLATE 75 MG PO CAPS
75.0000 mg | ORAL_CAPSULE | Freq: Two times a day (BID) | ORAL | Status: DC
  Filled 2014-04-28: qty 1

## 2014-04-28 MED ORDER — DEXTROSE 5 % IV SOLN: 500.0000 mg | INTRAVENOUS | Status: DC

## 2014-04-28 MED ORDER — ONDANSETRON HCL 4 MG/2ML IJ SOLN: 4.0000 mg | Freq: Four times a day (QID) | INTRAMUSCULAR | Status: DC | PRN

## 2014-04-28 MED ORDER — DEXTROSE 5 % IV SOLN
1.0000 g | Freq: Once | INTRAVENOUS | Status: AC
  Administered 2014-04-28: 1 g via INTRAVENOUS
  Filled 2014-04-28: qty 10

## 2014-04-28 MED ORDER — LEVOTHYROXINE SODIUM 125 MCG PO TABS
125.0000 ug | ORAL_TABLET | Freq: Every day | ORAL | Status: DC
  Administered 2014-04-29 – 2014-05-02 (×4): 125 ug via ORAL
  Filled 2014-04-28 (×6): qty 1

## 2014-04-28 MED ORDER — OXYCODONE HCL 5 MG PO TABS
5.0000 mg | ORAL_TABLET | ORAL | Status: DC | PRN
  Administered 2014-04-29: 5 mg via ORAL
  Filled 2014-04-28: qty 1

## 2014-04-28 MED ORDER — ALUM & MAG HYDROXIDE-SIMETH 200-200-20 MG/5ML PO SUSP: 30.0000 mL | Freq: Four times a day (QID) | ORAL | Status: DC | PRN

## 2014-04-28 MED ORDER — SODIUM CHLORIDE 0.9 % IV SOLN
INTRAVENOUS | Status: DC
  Administered 2014-04-28 – 2014-05-02 (×4): via INTRAVENOUS

## 2014-04-28 NOTE — ED Notes (Signed)
Lactic acid results called to primary RN [employer]

## 2014-04-28 NOTE — ED Provider Notes (Signed)
CSN: [id number]     Arrival date & time 04/28/14  1217 History   First MD Initiated Contact with Patient 04/28/14 1226     Chief Complaint  Patient presents with  . Dizziness     (Consider location/radiation/quality/duration/timing/severity/associated sxs/prior Treatment) HPI  80 year old female with history of breast cancer, has pacemaker, and history of hypothyroidism present for evaluation of dizziness.  Patient reports for the past 2 weeks she has been having URI symptoms including sore throat, nasal congestion, cough, and myalgia and generalized fatigue. She was seen by her PCP 2 days ago and was treated for bronchitis with cough medication and antibiotic "sulfa drug".  Since yesterday she is having worsening generalized fatigue and also blurry vision with associated lightheadedness and dizziness.  She also has a maximum temperature of 103 yesterday improved with taking Tylenol.  Patient denies headache, double vision, slurred speech, difficulty thinking, focal numbness, chest pain, hemoptysis, abdominal pain, nausea vomiting diarrhea, dysuria hematochezia or melena, or rash. She reports decrease in appetite has been eating and drinking as much. She does feels dehydrated. Patient is on Lasix for the past 5 years due to having lower extremity swelling but no documented history of CHF. She has not noticed any significant weight changes. Other medication list patient had Celebrex and  Pradaxa however patient denies taking Celebrex.  She denies history of rectal bleeding. Her breast cancer is in remission. No recent change in her blood pressure medication  Past Medical History  Diagnosis Date  . Hypertension   . Hypothyroid   . History of breast cancer   . Second degree heart block   . Heart murmur   . Cancer     left breast cA  . Arthritis   . COPD (chronic obstructive pulmonary disease)     spot on lung  . Anemia     after giving birth  . Cardiac pacemaker in situ 12/05/2011   Past  Surgical History  Procedure Laterality Date  . Shoulder surgery    . Finger surgery    . Cholecystectomy    . Tonsillectomy and adenoidectomy    . Abdominal hysterectomy    . Breast biopsy      lumpectomy  . Carpel tunnel surgery      left wrist   Family History  Problem Relation Age of Onset  . Cancer Father   . Heart attack Mother   . Cancer Brother    History  Substance Use Topics  . Smoking status: Current Every Day Smoker -- 1.00 packs/day for 60 years    Types: Cigarettes  . Smokeless tobacco: Never Used  . Alcohol Use: No   OB History   Grav Para Term Preterm Abortions TAB SAB Ect Mult Living                 Review of Systems  All other systems reviewed and are negative.     Allergies  Clindamycin/lincomycin and Penicillins  Home Medications   Prior to Admission medications   Medication Sig Start Date End Date Taking? Authorizing Provider  Alum & Mag Hydroxide-Simeth (MAGIC MOUTHWASH) SOLN Take 5 mLs by mouth every 5 (five) hours as needed. Gargle and swallow     Historical Provider, MD  benazepril (LOTENSIN) 20 MG tablet Take 20 mg by mouth daily.      Historical Provider, MD  BIOTIN 5000 PO Take 1 tablet by mouth daily.      Historical Provider, MD  calcium-vitamin D (OSCAL WITH D) 500-200 MG-UNIT  per tablet Take 1 tablet by mouth 2 (two) times daily.      Historical Provider, MD  celecoxib (CELEBREX) 200 MG capsule Take 200 mg by mouth daily.      Historical Provider, MD  dabigatran (PRADAXA) 75 MG CAPS Take 1 capsule (75 mg total) by mouth every 12 (twelve) hours. 01/27/12   Evans Lance, MD  diclofenac sodium (VOLTAREN) 1 % GEL Apply 1 application topically 4 (four) times daily as needed. For pain.     Historical Provider, MD  diltiazem (CARDIZEM CD) 180 MG 24 hr capsule Take 1 capsule (180 mg total) by mouth daily. 12/05/11 12/04/12  Jacolyn Reedy, MD  folic acid (FOLVITE) 1 MG tablet Take 1 mg by mouth daily.      Historical Provider, MD   furosemide (LASIX) 40 MG tablet Take 40 mg by mouth every other day.      Historical Provider, MD  levothyroxine (SYNTHROID, LEVOTHROID) 125 MCG tablet Take 125 mcg by mouth every morning. On an empty stomach     Historical Provider, MD  Omega-3 Fatty Acids (FISH OIL) 1200 MG CAPS Take 1 capsule by mouth 2 (two) times daily.      Historical Provider, MD  potassium chloride SA (K-DUR,KLOR-CON) 20 MEQ tablet Take 20 mEq by mouth 2 (two) times daily.      Historical Provider, MD  tolterodine (DETROL LA) 4 MG 24 hr capsule Take 4 mg by mouth daily.      Historical Provider, MD  vitamin B-12 (CYANOCOBALAMIN) 1000 MCG tablet Take 1,000 mcg by mouth daily.      Historical Provider, MD   Pulse 67  Resp 20  Wt 167 lb (75.751 kg)  SpO2 97% Physical Exam  Nursing note and vitals reviewed. Constitutional: She is oriented to person, place, and time. She appears well-developed and well-nourished. No distress.  HENT:  Head: Atraumatic.  Right Ear: External ear normal.  Left Ear: External ear normal.  Lips are dry, tongue is dry.  Eyes: Conjunctivae are normal.  Pale conjunctiva  Neck: Normal range of motion. Neck supple. No JVD present.  Cardiovascular: Normal rate, regular rhythm and intact distal pulses.   Murmur heard. Pulmonary/Chest: Effort normal and breath sounds normal. No respiratory distress. She has no wheezes. She has no rales. She exhibits no tenderness.  Abdominal: Soft. There is no tenderness.  Genitourinary:  Chaperone present:  Normal rectal tone, normal stool color, no mass, hemoccult negative.  Small amount of skin break down to perineal region without evidence to suggest Forniere Gangrene.   Musculoskeletal: She exhibits no edema.  Poor effort however strength is equal in all 4 extremities  Neurological: She is alert and oriented to person, place, and time. She has normal strength. No cranial nerve deficit. GCS eye subscore is 4. GCS verbal subscore is 5. GCS motor subscore is  6.  Skin: No rash noted.  Psychiatric: She has a normal mood and affect.    ED Course  Procedures (including critical care time)  12:55 PM Pt presents with progressive lightheadeness and URI sxs x 2 weeks.  Pt is hypotensive with BP 05L systolic.  Report Tmax 103 yesterday with cough.  Will initiate sepsis protocol including lactic acid, ua, labs, cxr, blood cultures, IVF and empirical abx. Care discussed with Dr. Betsey Holiday.    2:13 PM Blood pressure has improved after receiving 2 L of fluid. Elevated lactic acid of 2.52. Elevated WBC of 12.6 with a left shift. Chest x-ray one view shows  no obvious signs of pneumonia in no acute finding. Her UA is unremarkable. Her creatinine is 1.64, it was normal 2 years ago. This could reflect acute renal injury secondary to dehydration.  Pt did report not eating and drinking much for the past 2 days.  2:53 PM I have consulted with hospitalist, Dr. Coralyn Pear, who agrees to admit pt to step down, team 10, under his care for further treatment of sepsis 2/2 suspect CAP.     CRITICAL CARE Performed by: Domenic Moras Total critical care time: 35 min Critical care time was exclusive of separately billable procedures and treating other patients. Critical care was necessary to treat or prevent imminent or life-threatening deterioration. Critical care was time spent personally by me on the following activities: development of treatment plan with patient and/or surrogate as well as nursing, discussions with consultants, evaluation of patient's response to treatment, examination of patient, obtaining history from patient or surrogate, ordering and performing treatments and interventions, ordering and review of laboratory studies, ordering and review of radiographic studies, pulse oximetry and re-evaluation of patient's condition.    Labs Review Labs Reviewed  CBC WITH DIFFERENTIAL - Abnormal; Notable for the following:    WBC 12.6 (*)    Neutrophils Relative % 89 (*)     Neutro Abs 11.3 (*)    Lymphocytes Relative 5 (*)    Lymphs Abs 0.6 (*)    All other components within normal limits  COMPREHENSIVE METABOLIC PANEL - Abnormal; Notable for the following:    Glucose, Bld 144 (*)    Creatinine, Ser 1.64 (*)    Albumin 3.1 (*)    GFR calc non Af Amer 28 (*)    GFR calc Af Amer 32 (*)    All other components within normal limits  I-STAT CG4 LACTIC ACID, ED - Abnormal; Notable for the following:    Lactic Acid, Venous 2.52 (*)    All other components within normal limits  CULTURE, BLOOD (ROUTINE X 2)  CULTURE, BLOOD (ROUTINE X 2)  URINE CULTURE  URINALYSIS, ROUTINE W REFLEX MICROSCOPIC  POC OCCULT BLOOD, ED  Randolm Idol, ED    Imaging Review Dg Chest Port 1 View  04/28/2014   CLINICAL DATA:  Dizziness and low blood pressure  EXAM: PORTABLE CHEST - 1 VIEW  COMPARISON:  12/05/2011  FINDINGS: The pacer wires are stable. The cardiac silhouette, mediastinal and hilar contours are within normal limits and stable. The lungs are clear. No pleural effusion. The bony thorax is intact.  IMPRESSION: No acute cardiopulmonary findings.   Electronically Signed   By: Kalman Jewels M.D.   On: 04/28/2014 13:40     EKG Interpretation None      Date: 04/28/2014  Rate: 72  Rhythm: ventricular paced rhythm  QRS Axis: left  Intervals: ventricular paced rhythm, no further analysis  ST/T Wave abnormalities: indeterminate  Conduction Disutrbances:ventricular paced rhythm, no further analysis  Narrative Interpretation: ventricular paced rhythm, no further analysis  Old EKG Reviewed: none available    MDM   Final diagnoses:  Sepsis with hypotension  Cough    BP 95/73  Pulse 67  Temp(Src) 99.5 F (37.5 C) (Rectal)  Resp 24  Wt 167 lb (75.751 kg)  SpO2 95%  I have reviewed nursing notes and vital signs. I personally reviewed the imaging tests through PACS system  I reviewed available ER/hospitalization records thought the EMR     Domenic Moras,  [place] 04/28/14 1458

## 2014-04-28 NOTE — ED Notes (Signed)
Felt bad  X 2 weeks went to dr on Tuesday tx for bronchititis and  Given meds and went to dr today her bp was low

## 2014-04-28 NOTE — Progress Notes (Signed)
PharmD called and recommended no Pradaxa (pt on at home) secondary to renal status. Xarelto 15mg  ordered instead.  Clance Boll, NP [facility]

## 2014-04-28 NOTE — ED Provider Notes (Signed)
Medical screening examination/treatment/procedure(s) were conducted as a shared visit with non-physician practitioner(s) and myself.  I personally evaluated the patient during the encounter.   EKG Interpretation None     Patient presented to the ER for evaluation of generalized weakness. She has been feeling dizzy and experiencing upper respiratory infection type symptoms for 2 weeks. She has had nasal congestion, cough and generalized malaise. She thinks she had run a fever as high as 103 at home. She was treated with Bactrim for bronchitis recently, but has not improved. Upon arrival today she is hypotensive. This is concerning for early sepsis. She has mild leukocytosis and slight elevated lactic acid. Remainder of blood work is unremarkable. Blood and urine cultures pending. Cardiac workup negative. Patient will require hospitalization for further management of hypotension, possible need for pneumonia, likely early sepsis.  Orpah Greek, MD 04/28/14 [phone]

## 2014-04-28 NOTE — H&P (Addendum)
[facility] History and Physical  EDEE NIFONG GYB:[id number] DOB: [birth year] DOA: 04/28/2014  Referring physician:  PCP: Mathews Argyle, MD   Chief Complaint: Shortness of breath/cough  HPI: Maria Washington is a 80 y.o. female  with a past medical history of COPD, hypertension, hypothyroidism presenting to the emergency room with complaints of cough and shortness of breath. She reports having progressive cough, shortness of breath, associated white sputum production, fevers and chills for the past 2 weeks. Symptoms becoming worse over the past several days as she has had associated generalized weakness, malaise, fatigue, increasing shortness of breath, functional decline, diarrhea and poor appetite. Family reporting temperatures as high as 103 at home. She saw her primary care provider last Tuesday at which time she was given a prescription for Bactrim. Symptoms getting worse despite taking oral antibiotics. She followed up with her PCP today for the hypotensive in the office and referred immediately to the emergency department. On presentation she had a blood pressure of 70/40, improving to 3/80 after IV fluid resuscitation. Patient was given a dose of IV ceftriaxone and azithromycin. Labs showed white count of 12,600, creatinine 1.64 with lactic acid of 2.5. She reported feeling better after the administration of IV fluids.                                                                                                                                                                                                                                                       Review of Systems:  Constitutional:  No weight loss, Positive for night sweats, Fevers, chills, fatigue.  HEENT:  No headaches, Difficulty swallowing,Tooth/dental problems,Sore throat,  No sneezing, itching, ear ache, nasal congestion, post nasal drip,  Cardio-vascular:  No chest pain, Orthopnea, PND, swelling in lower  extremities, anasarca, dizziness, palpitations  GI:  No heartburn, indigestion, abdominal pain, nausea, vomiting, diarrhea, change in bowel habits, loss of appetite  Resp:  Positive for shortness of breath with exertion or at rest, excess mucus, productive cough, No coughing up of blood.No change in color of mucus.No wheezing.No chest wall deformity  Skin:  no rash or lesions.  GU:  no dysuria, change in color of urine, no urgency or frequency. No flank pain.  Musculoskeletal:  No joint pain or swelling. No decreased range of motion. No back pain.  Psych:  No change in mood or affect. No depression or anxiety. No memory loss.   Past Medical History  Diagnosis Date  . Hypertension   . Hypothyroid   . History of breast cancer   . Second degree heart block   . Heart murmur   . Cancer     left breast cA  . Arthritis   . COPD (chronic obstructive pulmonary disease)     spot on lung  . Anemia     after giving birth  . Cardiac pacemaker in situ 12/05/2011   Past Surgical History  Procedure Laterality Date  . Shoulder surgery    . Finger surgery    . Cholecystectomy    . Tonsillectomy and adenoidectomy    . Abdominal hysterectomy    . Breast biopsy      lumpectomy  . Carpel tunnel surgery      left wrist   Social History:  reports that she has been smoking Cigarettes.  She has a 60 pack-year smoking history. She has never used smokeless tobacco. She reports that she does not drink alcohol or use illicit drugs.  Allergies  Allergen Reactions  . Clindamycin/Lincomycin Rash  . Penicillins Rash    Family History  Problem Relation Age of Onset  . Cancer Father   . Heart attack Mother   . Cancer Brother      Prior to Admission medications   Medication Sig Start Date End Date Taking? Authorizing Provider  alendronate (FOSAMAX) 70 MG tablet Take 70 mg by mouth every 7 (seven) days. Monday 03/21/14  Yes Historical Provider, MD  Alum & Mag Hydroxide-Simeth (MAGIC MOUTHWASH)  SOLN Take 5 mLs by mouth every 5 (five) hours as needed. Gargle and swallow    Yes Historical Provider, MD  Aromatic Inhalants (VICKS VAPOR INHALER IN) Inhale 1 puff into the lungs daily as needed (allergies).   Yes Historical Provider, MD  benazepril (LOTENSIN) 20 MG tablet Take 20 mg by mouth daily.     Yes Historical Provider, MD  BIOTIN 5000 PO Take 5,000 mcg by mouth daily.    Yes Historical Provider, MD  calcium-vitamin D (OSCAL WITH D) 500-200 MG-UNIT per tablet Take 1 tablet by mouth 2 (two) times daily.     Yes Historical Provider, MD  Cholecalciferol (VITAMIN D) 2000 UNITS CAPS Take 2,000 Units by mouth daily.   Yes Historical Provider, MD  dabigatran (PRADAXA) 75 MG CAPS Take 1 capsule (75 mg total) by mouth every 12 (twelve) hours. 01/27/12  Yes Evans Lance, MD  dextromethorphan (DELSYM) 30 MG/5ML liquid Take 30 mg by mouth every 6 (six) hours as needed for cough.    Yes Historical Provider, MD  diltiazem (CARDIZEM CD) 180 MG 24 hr capsule Take 180 mg by mouth daily.   Yes Historical Provider, MD  folic acid (FOLVITE) 1 MG tablet Take 1 mg by mouth daily.     Yes Historical Provider, MD  furosemide (LASIX) 40 MG tablet Take 40 mg by mouth See admin instructions. Take 40 mg by mouth on Monday, Wednesday, and Friday   Yes Historical Provider, MD  levothyroxine (SYNTHROID, LEVOTHROID) 125 MCG tablet Take 125 mcg by mouth every morning. On an empty stomach   Yes Historical Provider, MD  mometasone (ELOCON) 0.1 % cream Apply 1 application topically daily as needed. 03/15/14  Yes Historical Provider, MD  Omega-3 Fatty Acids (FISH OIL) 1200 MG CAPS Take 1,200 mg by mouth 2 (two) times daily.    Yes Historical  Provider, MD  potassium chloride SA (K-DUR,KLOR-CON) 20 MEQ tablet Take 20 mEq by mouth 2 (two) times daily.     Yes Historical Provider, MD  sulfamethoxazole-trimethoprim (BACTRIM DS) 800-160 MG per tablet Take 1 tablet by mouth 2 (two) times daily. 04/26/14  Yes Historical Provider, MD    tolterodine (DETROL LA) 4 MG 24 hr capsule Take 4 mg by mouth daily.     Yes Historical Provider, MD  vitamin B-12 (CYANOCOBALAMIN) 1000 MCG tablet Take 1,000 mcg by mouth daily.     Yes Historical Provider, MD   Physical Exam: Filed Vitals:   04/28/14 1549  BP: 103/80  Pulse: 74  Temp: 97.8 F (36.6 C)  Resp: 26    BP 103/80  Pulse 74  Temp(Src) 97.8 F (36.6 C) (Oral)  Resp 26  Wt 75.751 kg (167 lb)  SpO2 98%  General:  Ill-appearing, in no acute distress, she is awake alert oriented Eyes: PERRL, normal lids, irises & conjunctiva ENT: grossly normal hearing, lips & tongue, dry oral mucosa Neck: no LAD, masses or thyromegaly, supple symmetrical Cardiovascular: Tachycardic, RRR, no m/r/g. No LE edema. Telemetry: SR, no arrhythmias  Respiratory: coarse respiratory sounds, positive bilateral rhonchi, diminished breath sounds Abdomen: soft, ntnd Skin: no rash or induration seen on limited exam Musculoskeletal: grossly normal tone BUE/BLE Psychiatric: grossly normal mood and affect, speech fluent and appropriate Neurologic: grossly non-focal.          Labs on Admission:  Basic Metabolic Panel:  Recent Labs Lab 04/28/14 1247  NA 138  K 3.9  CL 100  CO2 20  GLUCOSE 144*  BUN 13  CREATININE 1.64*  CALCIUM 9.4   Liver Function Tests:  Recent Labs Lab 04/28/14 1247  AST 13  ALT 8  ALKPHOS 73  BILITOT 0.3  PROT 7.4  ALBUMIN 3.1*   No results found for this basename: LIPASE, AMYLASE,  in the last 168 hours No results found for this basename: AMMONIA,  in the last 168 hours CBC:  Recent Labs Lab 04/28/14 1247  WBC 12.6*  NEUTROABS 11.3*  HGB 13.1  HCT 37.7  MCV 91.5  PLT 223   Cardiac Enzymes: No results found for this basename: CKTOTAL, CKMB, CKMBINDEX, TROPONINI,  in the last 168 hours  BNP (last 3 results) No results found for this basename: PROBNP,  in the last 8760 hours CBG: No results found for this basename: GLUCAP,  in the last 168  hours  Radiological Exams on Admission: Dg Chest Port 1 View  04/28/2014   CLINICAL DATA:  Dizziness and low blood pressure  EXAM: PORTABLE CHEST - 1 VIEW  COMPARISON:  12/05/2011  FINDINGS: The pacer wires are stable. The cardiac silhouette, mediastinal and hilar contours are within normal limits and stable. The lungs are clear. No pleural effusion. The bony thorax is intact.  IMPRESSION: No acute cardiopulmonary findings.   Electronically Signed   By: Kalman Jewels M.D.   On: 04/28/2014 13:40    EKG: Independently reviewed.   Assessment/Plan Principal Problem:   CAP (community acquired pneumonia) Active Problems:   Sepsis with hypotension   AKI (acute kidney injury)   COPD (chronic obstructive pulmonary disease)   Pacemaker   Atrial fibrillation   1. Community acquired pneumonia. Patient presented with clinical signs symptoms of pneumonia, presenting hypotensive, elevated lactic acid, leukocytosis. Initial chest x-ray did not reveal obvious infiltrate. Will treat with IV ceftriaxone and azithromycin, followup on blood cultures. Provide IV fluid resuscitation, supportive care, close monitoring in  the step down unit. Plan to repeat chest x-ray in a.m.  2. Sepsis, present on admission, evidence by hypotension, lactic acid of 2.5, white count of 12,600, acute renal failure, patient reports a temperature as high as 103 at home. Source of infection likely to be community acquired pneumonia given presenting signs and symptoms. Will provide IV fluid resuscitation, empiric IV and microbial therapy, followup on blood cultures and monitor closely in the step down unit. Patient's blood pressures improved and after aggressive IV fluid resuscitation in the emergency room. 3. Acute kidney injury. Lab work showing a creatinine of 1.64. Looking back at records, she had a normal kidney function with creatinine 0.98 on 02/06/2012. Likely secondary to profound dehydration, sepsis, prerenal azotemia. Provide IV  fluid resuscitation, followup on am lab work. 4. History of hypertension. She had been on multiple antihypertensive agents at home including diltiazem, Lasix and Lotensin. These agents will be held given her presenting low blood pressures. 5. Dehydration. But he related to underlying pneumonia as well as diarrhea. I suspect diuretic therapy contributing to development of dehydration. Providing IV fluid resuscitation, stopping Lasix. 6. Chronic anticoagulation. Patient with history of A. fib, will continue Pradaxa therapy 7. History of A. Fib. Holding diltiazem given hypotension, presently rate controlled. 8. DVT prophylaxis. Patient fully anticoagulated    Code Status: Full Code Family Communication: Spoke with daughters present at bedside Disposition Plan: Admit to Step Down Unit, anticipate will require greater than 2 nights hospitalization.   Time spent: 70 min  [facility] Pager [phone]  **Disclaimer: This note may have been dictated with voice recognition software. Similar sounding words can inadvertently be transcribed and this note may contain transcription errors which may not have been corrected upon publication of note.**

## 2014-04-28 NOTE — Progress Notes (Signed)
ANTIBIOTIC CONSULT NOTE - INITIAL  Pharmacy Consult for azithromycin and ceftriaxone Indication: CAP  Allergies  Allergen Reactions  . Clindamycin/Lincomycin   . Penicillins     Patient Measurements: Weight: 167 lb (75.751 kg)   Vital Signs: Temp: 97.8 F (36.6 C) (05/21 1227) BP: 84/66 mmHg (05/21 1242) Pulse Rate: 76 (05/21 1242) Intake/Output from previous day:   Intake/Output from this shift:    Labs: No results found for this basename: WBC, HGB, PLT, LABCREA, CREATININE,  in the last 72 hours The CrCl is unknown because both a height and weight (above a minimum accepted value) are required for this calculation. No results found for this basename: VANCOTROUGH, VANCOPEAK, VANCORANDOM, GENTTROUGH, GENTPEAK, GENTRANDOM, TOBRATROUGH, TOBRAPEAK, TOBRARND, AMIKACINPEAK, AMIKACINTROU, AMIKACIN,  in the last 72 hours   Microbiology: No results found for this or any previous visit (from the past 720 hour(s)).  Medical History: Past Medical History  Diagnosis Date  . Hypertension   . Hypothyroid   . History of breast cancer   . Second degree heart block   . Heart murmur   . Cancer     left breast cA  . Arthritis   . COPD (chronic obstructive pulmonary disease)     spot on lung  . Anemia     after giving birth  . Cardiac pacemaker in situ 12/05/2011    Assessment: 33 YOF brought in with generalized fatigue and URI symptoms x2 days. Reported Tmax yesterday of 103. BP soft. Code sepsis initiated. 1 time doses of ceftriaxone and azithromycin ordered- another pharmacist spoke to RN that medications are in the Pyxis and ceftriaxone needs to be given first.  Goal of Therapy:  Eradication of infection  Plan:  1. Ceftriaxone 1g IV q24h 2. Azithromycin 500mg  IV q24h 3. Recommend 5-7 days of total treatment for typical CAP 4. No renal adjustment needed for either medication. Pharmacy to sign off.  Maria Huante D. Maria Washington, PharmD, BCPS Clinical Pharmacist Pager:  [phone] 04/28/2014 1:22 PM

## 2014-04-29 ENCOUNTER — Inpatient Hospital Stay (HOSPITAL_COMMUNITY): Payer: Medicare Other

## 2014-04-29 ENCOUNTER — Encounter (HOSPITAL_COMMUNITY): Payer: Self-pay

## 2014-04-29 DIAGNOSIS — I4891 Unspecified atrial fibrillation: Secondary | ICD-10-CM

## 2014-04-29 DIAGNOSIS — N179 Acute kidney failure, unspecified: Secondary | ICD-10-CM

## 2014-04-29 DIAGNOSIS — R197 Diarrhea, unspecified: Secondary | ICD-10-CM

## 2014-04-29 DIAGNOSIS — A419 Sepsis, unspecified organism: Principal | ICD-10-CM

## 2014-04-29 DIAGNOSIS — J9600 Acute respiratory failure, unspecified whether with hypoxia or hypercapnia: Secondary | ICD-10-CM

## 2014-04-29 DIAGNOSIS — Z950 Presence of cardiac pacemaker: Secondary | ICD-10-CM

## 2014-04-29 DIAGNOSIS — E860 Dehydration: Secondary | ICD-10-CM

## 2014-04-29 DIAGNOSIS — J9601 Acute respiratory failure with hypoxia: Secondary | ICD-10-CM | POA: Diagnosis present

## 2014-04-29 DIAGNOSIS — J189 Pneumonia, unspecified organism: Secondary | ICD-10-CM

## 2014-04-29 DIAGNOSIS — E039 Hypothyroidism, unspecified: Secondary | ICD-10-CM

## 2014-04-29 LAB — TSH: TSH: 0.262 u[IU]/mL — ABNORMAL LOW (ref 0.350–4.500)

## 2014-04-29 LAB — BASIC METABOLIC PANEL
BUN: 9 mg/dL (ref 6–23)
CO2: 18 mEq/L — ABNORMAL LOW (ref 19–32)
Calcium: 8.2 mg/dL — ABNORMAL LOW (ref 8.4–10.5)
Chloride: 106 mEq/L (ref 96–112)
Creatinine, Ser: 0.97 mg/dL (ref 0.50–1.10)
GFR, EST AFRICAN AMERICAN: 61 mL/min — AB (ref >90)
GFR, EST NON AFRICAN AMERICAN: 53 mL/min — AB (ref >90)
GLUCOSE: 182 mg/dL — AB (ref 70–99)
POTASSIUM: 2.8 meq/L — AB (ref 3.7–5.3)
Sodium: 137 mEq/L (ref 137–147)

## 2014-04-29 LAB — URINE CULTURE
Colony Count: NO GROWTH
Culture: NO GROWTH

## 2014-04-29 LAB — CBC
HCT: 31.8 % — ABNORMAL LOW (ref 36.0–46.0)
HEMOGLOBIN: 10.5 g/dL — AB (ref 12.0–15.0)
MCH: 30.8 pg (ref 26.0–34.0)
MCHC: 33 g/dL (ref 30.0–36.0)
MCV: 93.3 fL (ref 78.0–100.0)
Platelets: 157 10*3/uL (ref 150–400)
RBC: 3.41 MIL/uL — ABNORMAL LOW (ref 3.87–5.11)
RDW: 14.7 % (ref 11.5–15.5)
WBC: 7.3 10*3/uL (ref 4.0–10.5)

## 2014-04-29 LAB — LACTIC ACID, PLASMA: Lactic Acid, Venous: 1.3 mmol/L (ref 0.5–2.2)

## 2014-04-29 LAB — MAGNESIUM: Magnesium: 1.8 mg/dL (ref 1.5–2.5)

## 2014-04-29 MED ORDER — IPRATROPIUM-ALBUTEROL 0.5-2.5 (3) MG/3ML IN SOLN: 3.0000 mL | RESPIRATORY_TRACT | Status: DC | PRN

## 2014-04-29 MED ORDER — OSELTAMIVIR PHOSPHATE 30 MG PO CAPS
30.0000 mg | ORAL_CAPSULE | Freq: Two times a day (BID) | ORAL | Status: DC
  Administered 2014-04-29 – 2014-05-03 (×8): 30 mg via ORAL
  Filled 2014-04-29 (×9): qty 1

## 2014-04-29 MED ORDER — POTASSIUM CHLORIDE CRYS ER 20 MEQ PO TBCR
60.0000 meq | EXTENDED_RELEASE_TABLET | Freq: Once | ORAL | Status: AC
  Administered 2014-04-29: 60 meq via ORAL
  Filled 2014-04-29: qty 3

## 2014-04-29 MED ORDER — IPRATROPIUM-ALBUTEROL 0.5-2.5 (3) MG/3ML IN SOLN
3.0000 mL | RESPIRATORY_TRACT | Status: DC
  Administered 2014-04-29 (×3): 3 mL via RESPIRATORY_TRACT
  Filled 2014-04-29 (×3): qty 3

## 2014-04-29 MED ORDER — LOPERAMIDE HCL 2 MG PO CAPS
4.0000 mg | ORAL_CAPSULE | Freq: Once | ORAL | Status: AC
Start: 2014-04-29 — End: 2014-04-29
  Administered 2014-04-29: 4 mg via ORAL

## 2014-04-29 MED ORDER — OSELTAMIVIR PHOSPHATE 75 MG PO CAPS: 75.0000 mg | ORAL_CAPSULE | Freq: Two times a day (BID) | ORAL | Status: DC

## 2014-04-29 MED ORDER — OSELTAMIVIR PHOSPHATE 30 MG PO CAPS
30.0000 mg | ORAL_CAPSULE | Freq: Every day | ORAL | Status: DC
  Administered 2014-04-29: 30 mg via ORAL
  Filled 2014-04-29: qty 1

## 2014-04-29 MED ORDER — LOPERAMIDE HCL 2 MG PO CAPS
2.0000 mg | ORAL_CAPSULE | ORAL | Status: DC | PRN
  Administered 2014-04-29: 2 mg via ORAL
  Filled 2014-04-29 (×2): qty 1

## 2014-04-29 MED ORDER — DM-GUAIFENESIN ER 30-600 MG PO TB12
1.0000 | ORAL_TABLET | Freq: Two times a day (BID) | ORAL | Status: DC
  Administered 2014-04-29 – 2014-05-03 (×9): 1 via ORAL
  Filled 2014-04-29 (×10): qty 1

## 2014-04-29 NOTE — Progress Notes (Signed)
[facility]  BRETTA FEES XBM:[id number] DOB: [birth year] DOA: 04/28/2014 PCP: Mathews Argyle, MD  Time spent :  Brief narrative: 80 y.o. WF PMHx COPD, hypertension, hypothyroidism presented to the emergency room with complaints of cough and shortness of breath. She reported having progressive cough, shortness of breath, associated white sputum production, fevers and chills for the past 2 weeks. Symptoms became worse over the past several days as she has had associated generalized weakness, malaise, fatigue, increasing shortness of breath, functional decline, diarrhea and poor appetite. Family reported temperatures as high as 103 at home. She saw her primary care provider last Tuesday and was given a prescription for Bactrim. Symptoms progressively worse despite taking oral antibiotics. She followed up with her PCP on the date of admission and was found to be hypotensive in the office and referred immediately to the emergency department.   On presentation to the ER she had a blood pressure of 70/40, improved to 93/47 after IV fluid resuscitation. Patient was given a dose of IV ceftriaxone and azithromycin. Labs showed white count of 12,600, creatinine 1.64 with lactic acid of 2.5. She reported feeling better after the administration of IV fluids.  HPI/Subjective: Still feels "lousy", productive cough of white sputum   Assessment/Plan:    Acute respiratory failure with hypoxia/CAP (community acquired pneumonia) -CXR without focal infiltrate and has a more diffuse process that appears more c/w a viral process -also having temp spikes up to 103 so concerning for Flu so will ck resp. Viral pnl -begin empiric Tamiflu and cont IV Rocephin and Zmax -cont supportive care -add mucolytics    Sepsis with hypotension -appears to have a pulmonary etiology -after tx of infection and hydration lactic acid has normalized -cont supportive care -hypotension has  resolved -FU on all cx's    COPD (chronic obstructive pulmonary disease) -not actively wheezing but tight -begin Duonebs    Atrial fibrillation -rate controlled -was on CCB pre admit -was on Pradaxa per admit but pharmD rec change to Xarelto 2/2 CKD -Obtain echocardiogram    AKI (acute kidney injury) on CKD 3 -influenced by ongoing use of diuretic at home and hypotension preadmit so likely ATN as well -baseline Scr 0.98 in 2013    Dehydration -rpt BMET pending -cont IVF -BP has rebounded    Hypertension -BP has rebounded although DBP <50 with MAP <65 so cont HOLD any anti HTN meds for now    Hypothyroidism -cont Synthroid  Hypokalemia -Will replete with potassium 60 mEq x1 -Check magnesium -Recheck in a.m.    Pacemaker  Diarrhea -Obtain stool culture -Obtain stool White blood cells -Obtain C. difficile PCR    DVT prophylaxis: Xarelto Code Status: Full Family Communication: Family at bedside Disposition Plan/Expected LOS: Step down   Consultants: None  Procedures: None  Antibiotics: Zithromax 5/21 >>> Rocephin 5/21 >>> Tamiflu 5/22 >>>  Objective: Blood pressure 138/62, pulse 62, temperature 97.8 F (36.6 C), temperature source Oral, resp. rate 19, height 5\' 2"  (1.575 m), weight 79.6 kg (175 lb 7.8 oz), SpO2 99.00%.  Intake/Output Summary (Last 24 hours) at 04/30/14 0272 Last data filed at 04/29/14 2000  Gross per 24 hour  Intake   2000 ml  Output      0 ml  Net   2000 ml     Exam: General: No acute respiratory distress-flushed Lungs: Clear to auscultation bilaterally without wheezes or crackles but very tight and diminished, 2 L Cardiovascular: Regular rate and  rhythm without murmur gallop or rub normal S1 and S2, no peripheral edema or JVD Abdomen: Nontender, nondistended, soft, bowel sounds positive, no rebound, no ascites, no appreciable mass Musculoskeletal: No significant cyanosis, clubbing of bilateral lower extremities   Scheduled  Meds:  Scheduled Meds: . azithromycin  500 mg Intravenous Q24H  . cefTRIAXone (ROCEPHIN)  IV  1 g Intravenous Q24H  . dextromethorphan-guaiFENesin  1 tablet Oral BID  . levothyroxine  125 mcg Oral QAC breakfast  . oseltamivir  30 mg Oral BID  . rivaroxaban  15 mg Oral Q supper  . sodium chloride  3 mL Intravenous Q12H   Continuous Infusions: . sodium chloride 100 mL/hr at 04/29/14 2000    Data Reviewed: Basic Metabolic Panel:  Recent Labs Lab 04/28/14 1247 04/29/14 1044 04/30/14 0332  NA 138 137 141  K 3.9 2.8* 3.0*  CL 100 106 111  CO2 20 18* 17*  GLUCOSE 144* 182* 114*  BUN 13 9 7   CREATININE 1.64* 0.97 0.83  CALCIUM 9.4 8.2* 8.3*  MG  --  1.8  --    Liver Function Tests:  Recent Labs Lab 04/28/14 1247 04/30/14 0332  AST 13 13  ALT 8 9  ALKPHOS 73 59  BILITOT 0.3 <0.2*  PROT 7.4 6.4  ALBUMIN 3.1* 2.5*   No results found for this basename: LIPASE, AMYLASE,  in the last 168 hours No results found for this basename: AMMONIA,  in the last 168 hours CBC:  Recent Labs Lab 04/28/14 1247 04/29/14 1044 04/30/14 0332  WBC 12.6* 7.3 6.3  NEUTROABS 11.3*  --  4.5  HGB 13.1 10.5* 11.1*  HCT 37.7 31.8* 32.7*  MCV 91.5 93.3 91.9  PLT 223 157 159   Cardiac Enzymes: No results found for this basename: CKTOTAL, CKMB, CKMBINDEX, TROPONINI,  in the last 168 hours BNP (last 3 results) No results found for this basename: PROBNP,  in the last 8760 hours CBG: No results found for this basename: GLUCAP,  in the last 168 hours  Recent Results (from the past 240 hour(s))  CULTURE, BLOOD (ROUTINE X 2)     Status: None   Collection Time    04/28/14 12:47 PM      Result Value Ref Range Status   Specimen Description BLOOD RIGHT WRIST   Final   Special Requests     Final   Value: BOTTLES DRAWN AEROBIC AND ANAEROBIC BLUE 10CC RED [place]   Culture  Setup Time     Final   Value: 04/28/2014 17:04     Performed at [facility]   Culture     Final   Value:         BLOOD CULTURE RECEIVED NO GROWTH TO DATE CULTURE WILL BE HELD FOR 5 DAYS BEFORE ISSUING A FINAL NEGATIVE REPORT     Performed at [facility]   Report Status PENDING   Incomplete  CULTURE, BLOOD (ROUTINE X 2)     Status: None   Collection Time    04/28/14 12:52 PM      Result Value Ref Range Status   Specimen Description BLOOD RIGHT FOREARM   Final   Special Requests BOTTLES DRAWN AEROBIC AND ANAEROBIC 10CC   Final   Culture  Setup Time     Final   Value: 04/28/2014 17:04     Performed at [facility]   Culture     Final   Value:        BLOOD CULTURE RECEIVED NO  GROWTH TO DATE CULTURE WILL BE HELD FOR 5 DAYS BEFORE ISSUING A FINAL NEGATIVE REPORT     Performed at [facility]   Report Status PENDING   Incomplete  URINE CULTURE     Status: None   Collection Time    04/28/14  1:47 PM      Result Value Ref Range Status   Specimen Description URINE, CATHETERIZED   Final   Special Requests NONE   Final   Culture  Setup Time     Final   Value: 04/28/2014 20:37     Performed at [place]     Final   Value: NO GROWTH     Performed at [facility]   Culture     Final   Value: NO GROWTH     Performed at [facility]   Report Status 04/29/2014 FINAL   Final  MRSA PCR SCREENING     Status: None   Collection Time    04/28/14  7:47 PM      Result Value Ref Range Status   MRSA by PCR NEGATIVE  NEGATIVE Final   Comment:            The GeneXpert MRSA Assay (FDA     approved for NASAL specimens     only), is one component of a     comprehensive MRSA colonization     surveillance program. It is not     intended to diagnose MRSA     infection nor to guide or     monitor treatment for     MRSA infections.     Studies:  Recent x-ray studies have been reviewed in detail by the Attending Physician       Erin Hearing, ANP [facility] Office  [phone] Pager [phone]   **If unable to reach the above  provider after paging please contact the [place] @ [phone]  On-Call/Text Page:      [email or web address]      password [email or web address]  If 7PM-7AM, please contact night-coverage [email or web address] Password [facility] 04/30/2014, 6:58 AM   LOS: 2 days   Examined patient with ANP Ebony Hail discussed assessment and plan, and agree with the above plan.  All questions from patient and family were answered.  Greater than 35 minutes were used in direct patient care of this complicated patient

## 2014-04-29 NOTE — Progress Notes (Signed)
Utilization review completed. Shiana Rappleye, RN, BSN. 

## 2014-04-29 NOTE — Progress Notes (Signed)
INITIAL NUTRITION ASSESSMENT  DOCUMENTATION CODES Per approved criteria  -Obesity Unspecified   INTERVENTION: Recommend diet liberalization to Regular RD to follow for nutrition care plan  NUTRITION DIAGNOSIS: Inadequate oral intake related to decreased appetite as evidenced by patient report  Goal: Pt to meet >/= 90% of their estimated nutrition needs   Monitor:  PO & supplemental intake, weight, labs, I/O's  Reason for Assessment: Malnutrition Screening Tool Report  80 y.o. female  Admitting Dx: CAP (community acquired pneumonia)  ASSESSMENT: 80 y.o. female with PMH of COPD, HTN, hypothyroidism presented to ED with complaints of cough and shortness of breath. She reports having progressive cough, shortness of breath, associated white sputum production, fevers and chills for the past 2 weeks.   CXR without focal infiltrate; had a more diffuse process that appears more c/w a viral process.  RD spoke with pt at bedside; family present; pt reports her appetite hasn't been very good for the past week or so; also reported some weight loss, however, unable to accurately quantify; no % PO intake records available; pt's family member stated pt ate little at breakfast this AM due to "no taste;" they would like patient to be on Regular diet; patient declined addition of nutrition supplements.  No muscle or subcutaneous fat depletion noticed.  Height: Ht Readings from Last 1 Encounters:  04/28/14 5\' 2"  (1.575 m)    Weight: Wt Readings from Last 1 Encounters:  04/29/14 175 lb 7.8 oz (79.6 kg)    Ideal Body Weight: 110 lb  % Ideal Body Weight: 159%  Wt Readings from Last 10 Encounters:  04/29/14 175 lb 7.8 oz (79.6 kg)  02/06/12 159 lb 4.8 oz (72.258 kg)  01/13/12 157 lb (71.215 kg)  12/05/11 152 lb 14.4 oz (69.355 kg)  12/05/11 152 lb 14.4 oz (69.355 kg)    Usual Body Weight: unknown  % Usual Body Weight: ---  BMI:  Body mass index is 32.09 kg/(m^2).  Estimated  Nutritional Needs: Kcal: 9518-8416 Protein: 80-90 gm Fluid: 1.7-1.9 L  Skin: Intact  Diet Order: Cardiac  EDUCATION NEEDS: -No education needs identified at this time   Intake/Output Summary (Last 24 hours) at 04/29/14 1455 Last data filed at 04/29/14 0300  Gross per 24 hour  Intake    603 ml  Output    275 ml  Net    328 ml    Labs:   Recent Labs Lab 04/28/14 1247 04/29/14 1044  NA 138 137  K 3.9 2.8*  CL 100 106  CO2 20 18*  BUN 13 9  CREATININE 1.64* 0.97  CALCIUM 9.4 8.2*  MG  --  1.8  GLUCOSE 144* 182*    Scheduled Meds: . azithromycin  500 mg Intravenous Q24H  . cefTRIAXone (ROCEPHIN)  IV  1 g Intravenous Q24H  . dextromethorphan-guaiFENesin  1 tablet Oral BID  . ipratropium-albuterol  3 mL Nebulization Q4H  . levothyroxine  125 mcg Oral QAC breakfast  . oseltamivir  30 mg Oral Daily  . rivaroxaban  15 mg Oral Q supper  . sodium chloride  3 mL Intravenous Q12H    Continuous Infusions: . sodium chloride 100 mL/hr at 04/29/14 1008    Past Medical History  Diagnosis Date  . Hypertension   . Hypothyroid   . History of breast cancer   . Second degree heart block   . Heart murmur   . Cancer     left breast cA  . Arthritis   . COPD (chronic obstructive pulmonary  disease)     spot on lung  . Anemia     after giving birth  . Cardiac pacemaker in situ 12/05/2011    Past Surgical History  Procedure Laterality Date  . Shoulder surgery    . Finger surgery    . Cholecystectomy    . Tonsillectomy and adenoidectomy    . Abdominal hysterectomy    . Breast biopsy      lumpectomy  . Carpel tunnel surgery      left wrist    Arthur Holms, RD, LDN Pager #: [phone] After-Hours Pager #: [phone]

## 2014-04-30 DIAGNOSIS — I10 Essential (primary) hypertension: Secondary | ICD-10-CM

## 2014-04-30 DIAGNOSIS — R197 Diarrhea, unspecified: Secondary | ICD-10-CM | POA: Diagnosis present

## 2014-04-30 DIAGNOSIS — J4489 Other specified chronic obstructive pulmonary disease: Secondary | ICD-10-CM

## 2014-04-30 DIAGNOSIS — J449 Chronic obstructive pulmonary disease, unspecified: Secondary | ICD-10-CM

## 2014-04-30 DIAGNOSIS — R05 Cough: Secondary | ICD-10-CM

## 2014-04-30 DIAGNOSIS — R059 Cough, unspecified: Secondary | ICD-10-CM

## 2014-04-30 LAB — CBC WITH DIFFERENTIAL/PLATELET
BASOS ABS: 0 10*3/uL (ref 0.0–0.1)
BASOS PCT: 1 % (ref 0–1)
EOS PCT: 2 % (ref 0–5)
Eosinophils Absolute: 0.1 10*3/uL (ref 0.0–0.7)
HEMATOCRIT: 32.7 % — AB (ref 36.0–46.0)
Hemoglobin: 11.1 g/dL — ABNORMAL LOW (ref 12.0–15.0)
Lymphocytes Relative: 16 % (ref 12–46)
Lymphs Abs: 1 10*3/uL (ref 0.7–4.0)
MCH: 31.2 pg (ref 26.0–34.0)
MCHC: 33.9 g/dL (ref 30.0–36.0)
MCV: 91.9 fL (ref 78.0–100.0)
MONO ABS: 0.7 10*3/uL (ref 0.1–1.0)
MONOS PCT: 11 % (ref 3–12)
Neutro Abs: 4.5 10*3/uL (ref 1.7–7.7)
Neutrophils Relative %: 70 % (ref 43–77)
Platelets: 159 10*3/uL (ref 150–400)
RBC: 3.56 MIL/uL — ABNORMAL LOW (ref 3.87–5.11)
RDW: 14.5 % (ref 11.5–15.5)
WBC: 6.3 10*3/uL (ref 4.0–10.5)

## 2014-04-30 LAB — COMPREHENSIVE METABOLIC PANEL
ALBUMIN: 2.5 g/dL — AB (ref 3.5–5.2)
ALK PHOS: 59 U/L (ref 39–117)
ALT: 9 U/L (ref 0–35)
AST: 13 U/L (ref 0–37)
BUN: 7 mg/dL (ref 6–23)
CO2: 17 mEq/L — ABNORMAL LOW (ref 19–32)
Calcium: 8.3 mg/dL — ABNORMAL LOW (ref 8.4–10.5)
Chloride: 111 mEq/L (ref 96–112)
Creatinine, Ser: 0.83 mg/dL (ref 0.50–1.10)
GFR calc Af Amer: 74 mL/min — ABNORMAL LOW (ref >90)
GFR calc non Af Amer: 63 mL/min — ABNORMAL LOW (ref >90)
Glucose, Bld: 114 mg/dL — ABNORMAL HIGH (ref 70–99)
Potassium: 3 mEq/L — ABNORMAL LOW (ref 3.7–5.3)
SODIUM: 141 meq/L (ref 137–147)
TOTAL PROTEIN: 6.4 g/dL (ref 6.0–8.3)
Total Bilirubin: <0.2 mg/dL — ABNORMAL LOW (ref 0.3–1.2)

## 2014-04-30 LAB — CLOSTRIDIUM DIFFICILE BY PCR: Toxigenic C. Difficile by PCR: NEGATIVE

## 2014-04-30 LAB — OCCULT BLOOD X 1 CARD TO LAB, STOOL: Fecal Occult Bld: NEGATIVE

## 2014-04-30 LAB — MAGNESIUM: MAGNESIUM: 1.8 mg/dL (ref 1.5–2.5)

## 2014-04-30 MED ORDER — METHYLPREDNISOLONE SODIUM SUCC 125 MG IJ SOLR
60.0000 mg | Freq: Every day | INTRAMUSCULAR | Status: DC
  Administered 2014-04-30 – 2014-05-02 (×3): 60 mg via INTRAVENOUS
  Filled 2014-04-30 (×3): qty 0.96

## 2014-04-30 MED ORDER — POTASSIUM CHLORIDE CRYS ER 20 MEQ PO TBCR
60.0000 meq | EXTENDED_RELEASE_TABLET | Freq: Two times a day (BID) | ORAL | Status: AC
  Administered 2014-04-30 – 2014-05-01 (×3): 60 meq via ORAL
  Filled 2014-04-30 (×3): qty 3

## 2014-04-30 MED ORDER — AZITHROMYCIN 500 MG PO TABS
500.0000 mg | ORAL_TABLET | Freq: Every day | ORAL | Status: DC
  Administered 2014-05-01 – 2014-05-03 (×3): 500 mg via ORAL
  Filled 2014-04-30 (×3): qty 1

## 2014-04-30 MED ORDER — IPRATROPIUM-ALBUTEROL 0.5-2.5 (3) MG/3ML IN SOLN
3.0000 mL | Freq: Four times a day (QID) | RESPIRATORY_TRACT | Status: DC
  Administered 2014-04-30 – 2014-05-01 (×2): 3 mL via RESPIRATORY_TRACT
  Filled 2014-04-30 (×3): qty 3

## 2014-04-30 NOTE — Progress Notes (Signed)
[place] TEAM 1 - Stepdown/ICU TEAM Progress Note  Maria Washington:[id number] DOB: [birth year] DOA: 04/28/2014 PCP: Mathews Argyle, MD  Admit HPI / Brief Narrative: 80 y.o. WF PMHx COPD, hypertension, hypothyroidism presented to the emergency room with complaints of cough and shortness of breath. She reported having progressive cough, shortness of breath, associated white sputum production, fevers and chills for the past 2 weeks. Symptoms became worse over the past several days as she has had associated generalized weakness, malaise, fatigue, increasing shortness of breath, functional decline, diarrhea and poor appetite. Family reported temperatures as high as 103 at home. She saw her primary care provider last Tuesday and was given a prescription for Bactrim. Symptoms progressively worse despite taking oral antibiotics. She followed up with her PCP on the date of admission and was found to be hypotensive in the office and referred immediately to the emergency department.  On presentation to the ER she had a blood pressure of 70/40, improved to 93/47 after IV fluid resuscitation. Patient was given a dose of IV ceftriaxone and azithromycin. Labs showed white count of 12,600, creatinine 1.64 with lactic acid of 2.5. She reported feeling better after the administration of IV fluids.      HPI/Subjective: 5/23 patient states feeling improved but still weak. Negative CP/SOB.   Assessment/Plan: Acute respiratory failure with hypoxia/CAP (community acquired pneumonia)  -CXR without focal infiltrate and has a more diffuse process that appears more c/w a viral process  -Spiked a fever overnight 38C; Resp. Viral pnl pending -Continue empiric Tamiflu and cont IV Rocephin and Zmax  -DuoNeb's to QID  -Continue Mucinex DM BID -Solu-Medrol 60 mg daily -PT/OT consult   Sepsis with hypotension  -Resolved, patient's BP running to trend up will still hold off on restarting BP medication at this time.   -Cultures have been negative to date    COPD (chronic obstructive pulmonary disease)  -not actively wheezing but tight  -Continue scheduled Duonebs  -  Atrial fibrillation  -rate controlled, on no medication; will restart Cardizem if patient heart rate> 100  -was on Pradaxa per admit but pharmD rec change to Xarelto 2/2 CKD; continue Xarelto   -Echocardiogram pending  AKI (acute kidney injury) on CKD 3  -influenced by ongoing use of diuretic at home and hypotension preadmit so likely ATN as well  -baseline Scr 0.98 in 2013  -5/23 results  Dehydration  -Resolved   Hypertension  -Mildly hypertensive, will allow patient to continue to be progressively HTN watch closely     Hypothyroidism  -cont Synthroid   Hypokalemia  -Will replete with potassium 60 mEq BID  -Check magnesium  -Recheck in a.m.   Pacemaker   Diarrhea  -stool culture pending -stool White blood cells pending -C. difficile PCR pending    Code Status: FULL Family Communication: no family present at time of exam Disposition Plan: Resolution of URI   Consultants: NA  Procedure/Significant Events:    Culture 5/21 blood right forearm/wrist negative (pending) 5/21 urine negative (final)  5/22 respiratory virus pending 5/23 Clostridium difficile by PCR negative 5/23 stool pending   Antibiotics: Zithromax 5/21 >>>  Rocephin 5/21 >>>  Tamiflu 5/22 >>>   DVT prophylaxis: Xarelto   Devices NA   LINES / TUBES:  5/21 20 GA right forearm 5/21 20 Ga right wrist     Continuous Infusions: . sodium chloride 100 mL/hr at 04/30/14 1054    Objective: VITAL SIGNS: Temp: 98.4 F (36.9 C) (05/23 0925) Temp src: Oral (05/23 0925)  BP: 165/59 mmHg (05/23 0811) Pulse Rate: 67 (05/23 0925) SPO2; FIO2:   Intake/Output Summary (Last 24 hours) at 04/30/14 1206 Last data filed at 04/30/14 0925  Gross per 24 hour  Intake 3711.67 ml  Output    200 ml  Net 3511.67 ml     Exam: General:  A./O. x4, NAD, No acute respiratory distress; still feels weak. Lungs: Clear to auscultation bilaterally without wheezes or crackles Cardiovascular: Regular rate and rhythm without murmur gallop or rub normal S1 and S2 Abdomen: Nontender, nondistended, soft, bowel sounds positive, no rebound, no ascites, no appreciable mass Extremities: No significant cyanosis, clubbing, or edema bilateral lower extremities  Data Reviewed: Basic Metabolic Panel:  Recent Labs Lab 04/28/14 1247 04/29/14 1044 04/30/14 0332  NA 138 137 141  K 3.9 2.8* 3.0*  CL 100 106 111  CO2 20 18* 17*  GLUCOSE 144* 182* 114*  BUN 13 9 7   CREATININE 1.64* 0.97 0.83  CALCIUM 9.4 8.2* 8.3*  MG  --  1.8  --    Liver Function Tests:  Recent Labs Lab 04/28/14 1247 04/30/14 0332  AST 13 13  ALT 8 9  ALKPHOS 73 59  BILITOT 0.3 <0.2*  PROT 7.4 6.4  ALBUMIN 3.1* 2.5*   No results found for this basename: LIPASE, AMYLASE,  in the last 168 hours No results found for this basename: AMMONIA,  in the last 168 hours CBC:  Recent Labs Lab 04/28/14 1247 04/29/14 1044 04/30/14 0332  WBC 12.6* 7.3 6.3  NEUTROABS 11.3*  --  4.5  HGB 13.1 10.5* 11.1*  HCT 37.7 31.8* 32.7*  MCV 91.5 93.3 91.9  PLT 223 157 159   Cardiac Enzymes: No results found for this basename: CKTOTAL, CKMB, CKMBINDEX, TROPONINI,  in the last 168 hours BNP (last 3 results) No results found for this basename: PROBNP,  in the last 8760 hours CBG: No results found for this basename: GLUCAP,  in the last 168 hours  Recent Results (from the past 240 hour(s))  CULTURE, BLOOD (ROUTINE X 2)     Status: None   Collection Time    04/28/14 12:47 PM      Result Value Ref Range Status   Specimen Description BLOOD RIGHT WRIST   Final   Special Requests     Final   Value: BOTTLES DRAWN AEROBIC AND ANAEROBIC BLUE 10CC RED [place]   Culture  Setup Time     Final   Value: 04/28/2014 17:04     Performed at [facility]   Culture     Final   Value:         BLOOD CULTURE RECEIVED NO GROWTH TO DATE CULTURE WILL BE HELD FOR 5 DAYS BEFORE ISSUING A FINAL NEGATIVE REPORT     Performed at [facility]   Report Status PENDING   Incomplete  CULTURE, BLOOD (ROUTINE X 2)     Status: None   Collection Time    04/28/14 12:52 PM      Result Value Ref Range Status   Specimen Description BLOOD RIGHT FOREARM   Final   Special Requests BOTTLES DRAWN AEROBIC AND ANAEROBIC 10CC   Final   Culture  Setup Time     Final   Value: 04/28/2014 17:04     Performed at [facility]   Culture     Final   Value:        BLOOD CULTURE RECEIVED NO GROWTH TO DATE CULTURE WILL BE HELD FOR 5  DAYS BEFORE ISSUING A FINAL NEGATIVE REPORT     Performed at [facility]   Report Status PENDING   Incomplete  URINE CULTURE     Status: None   Collection Time    04/28/14  1:47 PM      Result Value Ref Range Status   Specimen Description URINE, CATHETERIZED   Final   Special Requests NONE   Final   Culture  Setup Time     Final   Value: 04/28/2014 20:37     Performed at [place]     Final   Value: NO GROWTH     Performed at [facility]   Culture     Final   Value: NO GROWTH     Performed at [facility]   Report Status 04/29/2014 FINAL   Final  MRSA PCR SCREENING     Status: None   Collection Time    04/28/14  7:47 PM      Result Value Ref Range Status   MRSA by PCR NEGATIVE  NEGATIVE Final   Comment:            The GeneXpert MRSA Assay (FDA     approved for NASAL specimens     only), is one component of a     comprehensive MRSA colonization     surveillance program. It is not     intended to diagnose MRSA     infection nor to guide or     monitor treatment for     MRSA infections.  CLOSTRIDIUM DIFFICILE BY PCR     Status: None   Collection Time    04/30/14  9:41 AM      Result Value Ref Range Status   C difficile by pcr NEGATIVE  NEGATIVE Final     Studies:  Recent x-ray studies have  been reviewed in detail by the Attending Physician  Scheduled Meds:  Scheduled Meds: . azithromycin  500 mg Intravenous Q24H  . cefTRIAXone (ROCEPHIN)  IV  1 g Intravenous Q24H  . dextromethorphan-guaiFENesin  1 tablet Oral BID  . levothyroxine  125 mcg Oral QAC breakfast  . oseltamivir  30 mg Oral BID  . rivaroxaban  15 mg Oral Q supper  . sodium chloride  3 mL Intravenous Q12H    Time spent on care of this patient: 40 mins   Allie Bossier , MD   [facility] Office  [phone] Pager [phone]  On-Call/Text Page:      [email or web address]      password [email or web address]  If 7PM-7AM, please contact night-coverage [email or web address] Password [email or web address] 04/30/2014, 12:06 PM   LOS: 2 days

## 2014-04-30 NOTE — Progress Notes (Signed)
  Echocardiogram 2D Echocardiogram has been performed.  Basilia Jumbo 04/30/2014, 9:04 AM

## 2014-05-01 DIAGNOSIS — I5032 Chronic diastolic (congestive) heart failure: Secondary | ICD-10-CM | POA: Diagnosis present

## 2014-05-01 DIAGNOSIS — I5030 Unspecified diastolic (congestive) heart failure: Secondary | ICD-10-CM

## 2014-05-01 DIAGNOSIS — I509 Heart failure, unspecified: Secondary | ICD-10-CM

## 2014-05-01 LAB — COMPREHENSIVE METABOLIC PANEL
ALT: 13 U/L (ref 0–35)
AST: 15 U/L (ref 0–37)
Albumin: 2.6 g/dL — ABNORMAL LOW (ref 3.5–5.2)
Alkaline Phosphatase: 70 U/L (ref 39–117)
BUN: 5 mg/dL — ABNORMAL LOW (ref 6–23)
CALCIUM: 8.9 mg/dL (ref 8.4–10.5)
CHLORIDE: 108 meq/L (ref 96–112)
CO2: 18 meq/L — AB (ref 19–32)
Creatinine, Ser: 0.68 mg/dL (ref 0.50–1.10)
GFR calc Af Amer: >90 mL/min (ref >90)
GFR calc non Af Amer: 79 mL/min — ABNORMAL LOW (ref >90)
Glucose, Bld: 198 mg/dL — ABNORMAL HIGH (ref 70–99)
Potassium: 4.7 mEq/L (ref 3.7–5.3)
SODIUM: 139 meq/L (ref 137–147)
Total Protein: 6.9 g/dL (ref 6.0–8.3)

## 2014-05-01 LAB — BLOOD GAS, ARTERIAL
Acid-base deficit: 4.9 mmol/L — ABNORMAL HIGH (ref 0.0–2.0)
Bicarbonate: 19.1 mEq/L — ABNORMAL LOW (ref 20.0–24.0)
Drawn by: 281201
FIO2: 0.21 %
O2 SAT: 95.6 %
PATIENT TEMPERATURE: 98.6
PH ART: 7.396 (ref 7.350–7.450)
TCO2: 20.1 mmol/L (ref 0–100)
pCO2 arterial: 31.8 mmHg — ABNORMAL LOW (ref 35.0–45.0)
pO2, Arterial: 95.2 mmHg (ref 80.0–100.0)

## 2014-05-01 LAB — CBC WITH DIFFERENTIAL/PLATELET
Basophils Absolute: 0 10*3/uL (ref 0.0–0.1)
Basophils Relative: 0 % (ref 0–1)
EOS ABS: 0 10*3/uL (ref 0.0–0.7)
EOS PCT: 0 % (ref 0–5)
HCT: 32.1 % — ABNORMAL LOW (ref 36.0–46.0)
HEMOGLOBIN: 10.7 g/dL — AB (ref 12.0–15.0)
Lymphocytes Relative: 14 % (ref 12–46)
Lymphs Abs: 0.5 10*3/uL — ABNORMAL LOW (ref 0.7–4.0)
MCH: 29.9 pg (ref 26.0–34.0)
MCHC: 33.3 g/dL (ref 30.0–36.0)
MCV: 89.7 fL (ref 78.0–100.0)
MONOS PCT: 3 % (ref 3–12)
Monocytes Absolute: 0.1 10*3/uL (ref 0.1–1.0)
NEUTROS PCT: 83 % — AB (ref 43–77)
Neutro Abs: 2.9 10*3/uL (ref 1.7–7.7)
Platelets: 165 10*3/uL (ref 150–400)
RBC: 3.58 MIL/uL — ABNORMAL LOW (ref 3.87–5.11)
RDW: 14.5 % (ref 11.5–15.5)
WBC: 3.6 10*3/uL — ABNORMAL LOW (ref 4.0–10.5)

## 2014-05-01 MED ORDER — IPRATROPIUM-ALBUTEROL 0.5-2.5 (3) MG/3ML IN SOLN: 3.0000 mL | RESPIRATORY_TRACT | Status: DC | PRN

## 2014-05-01 MED ORDER — CARVEDILOL 3.125 MG PO TABS
3.1250 mg | ORAL_TABLET | Freq: Two times a day (BID) | ORAL | Status: DC
  Administered 2014-05-01 – 2014-05-02 (×2): 3.125 mg via ORAL
  Filled 2014-05-01 (×4): qty 1

## 2014-05-01 MED ORDER — BENAZEPRIL HCL 10 MG PO TABS
10.0000 mg | ORAL_TABLET | Freq: Every day | ORAL | Status: DC
  Administered 2014-05-01 – 2014-05-03 (×3): 10 mg via ORAL
  Filled 2014-05-01 (×3): qty 1

## 2014-05-01 MED ORDER — MAGNESIUM SULFATE 40 MG/ML IJ SOLN
2.0000 g | Freq: Once | INTRAMUSCULAR | Status: AC
  Administered 2014-05-01: 2 g via INTRAVENOUS
  Filled 2014-05-01 (×2): qty 50

## 2014-05-01 MED ORDER — VITAMIN B-12 1000 MCG PO TABS
1000.0000 ug | ORAL_TABLET | Freq: Every day | ORAL | Status: DC
  Administered 2014-05-01 – 2014-05-03 (×3): 1000 ug via ORAL
  Filled 2014-05-01 (×3): qty 1

## 2014-05-01 MED ORDER — IPRATROPIUM-ALBUTEROL 0.5-2.5 (3) MG/3ML IN SOLN
3.0000 mL | Freq: Two times a day (BID) | RESPIRATORY_TRACT | Status: DC
  Administered 2014-05-01 – 2014-05-02 (×3): 3 mL via RESPIRATORY_TRACT
  Filled 2014-05-01 (×4): qty 3

## 2014-05-01 NOTE — Progress Notes (Signed)
[place] TEAM 1 - Stepdown/ICU TEAM Progress Note  Maria Washington JJK:[id number] DOB: [birth year] DOA: 04/28/2014 PCP: Mathews Argyle, MD  Admit HPI / Brief Narrative: 80 y.o. WF PMHx COPD, hypertension, hypothyroidism presented to the emergency room with complaints of cough and shortness of breath. She reported having progressive cough, shortness of breath, associated white sputum production, fevers and chills for the past 2 weeks. Symptoms became worse over the past several days as she has had associated generalized weakness, malaise, fatigue, increasing shortness of breath, functional decline, diarrhea and poor appetite. Family reported temperatures as high as 103 at home. She saw her primary care provider last Tuesday and was given a prescription for Bactrim. Symptoms progressively worse despite taking oral antibiotics. She followed up with her PCP on the date of admission and was found to be hypotensive in the office and referred immediately to the emergency department.  On presentation to the ER she had a blood pressure of 70/40, improved to 93/47 after IV fluid resuscitation. Patient was given a dose of IV ceftriaxone and azithromycin. Labs showed white count of 12,600, creatinine 1.64 with lactic acid of 2.5. She reported feeling better after the administration of IV fluids.      HPI/Subjective: 5/24 patient states feeling improved but still weak. Negative CP/SOB. Requested to have her vitamin B12 restarted   Assessment/Plan: Acute respiratory failure with hypoxia/CAP (community acquired pneumonia)  -CXR without focal infiltrate and has a more diffuse process that appears more c/w a viral process  -Afebrile overnight ; Resp. Viral pnl pending -Continue empiric Tamiflu and cont IV Rocephin and Zmax  -DuoNeb's decreased to BID PRN   -Continue Mucinex DM BID -Solu-Medrol 60 mg daily -PT/OT consult   Sepsis with hypotension  -Resolved, patient's BP running to trend up will  low-dose benazepril 10 mg daily.   -Cultures have been negative to date    COPD (chronic obstructive pulmonary disease)  -not actively wheezing but tight  -Decrease Duonebs to BID PRN - Diastolic CHF -Start benazepril 10 mg daily -Will try starting low-dose Coreg 3.125 mg BID  Atrial fibrillation  -rate controlled, on no medication; will restart Cardizem if patient heart rate> 100  -was on Pradaxa per admit but pharmD rec change to Xarelto 2/CKD; continue Xarelto   -Echocardiogram pending  AKI (acute kidney injury) on CKD 3  -baseline Scr 0.98 in 2013  -5/24 Patient's creatinine dramatically improved Cr= 0.68  Dehydration  -Resolved   Hypertension  -5/24 BP trending up start Benazepril 10 mg daily (home dose 20 mg daily)  -Start low-dose Coreg 3.125 mg BID    Hypothyroidism  -cont Synthroid   Hypokalemia  -Resolved  -Potassium goal > 65meq/L  Hypomagnesemia  -Magnesium goal> 2mg /dL  Pacemaker   Diarrhea  -stool culture pending -stool White blood cells pending -C. difficile PCR negative  Anemia -Patient on chronic B12 1000 mcg daily   Code Status: FULL Family Communication: no family present at time of exam Disposition Plan: Resolution of URI   Consultants: NA  Procedure/Significant Events: 5/23 echocardiogram -LVEF 55%- 60%.  - (grade 1 diastolic dysfunction). - Aortic valve: There was mild regurgitation. - Left atrium: The atrium was moderately dilated.    Culture 5/21 blood right forearm/wrist negative (pending) 5/21 urine negative (final)  5/22 respiratory virus pending 5/23 Clostridium difficile by PCR negative 5/23 stool pending   Antibiotics: Zithromax 5/21 >>>  Rocephin 5/21 >>>  Tamiflu 5/22 >>>   DVT prophylaxis: Xarelto   Devices NA   LINES /  TUBES:  5/21 20 GA right forearm 5/21 20 Ga right wrist     Continuous Infusions: . sodium chloride 100 mL/hr at 05/01/14 0200    Objective: VITAL SIGNS: Temp: 97.2 F  (36.2 C) (05/24 1241) Temp src: Oral (05/24 1241) BP: 154/67 mmHg (05/24 1241) Pulse Rate: 60 (05/24 1241) SPO2; FIO2:   Intake/Output Summary (Last 24 hours) at 05/01/14 1628 Last data filed at 05/01/14 1400  Gross per 24 hour  Intake 3336.33 ml  Output    825 ml  Net 2511.33 ml     Exam: General: A./O. x4, NAD, No acute respiratory distress; still feels weak. Lungs: Clear to auscultation bilaterally without wheezes or crackles Cardiovascular: Regular rate and rhythm without murmur gallop or rub normal S1 and S2 Abdomen: Nontender, nondistended, soft, bowel sounds positive, no rebound, no ascites, no appreciable mass Extremities: No significant cyanosis, clubbing, or edema bilateral lower extremities  Data Reviewed: Basic Metabolic Panel:  Recent Labs Lab 04/28/14 1247 04/29/14 1044 04/30/14 0332 04/30/14 1315 05/01/14 0319  NA 138 137 141  --  139  K 3.9 2.8* 3.0*  --  4.7  CL 100 106 111  --  108  CO2 20 18* 17*  --  18*  GLUCOSE 144* 182* 114*  --  198*  BUN 13 9 7   --  5*  CREATININE 1.64* 0.97 0.83  --  0.68  CALCIUM 9.4 8.2* 8.3*  --  8.9  MG  --  1.8  --  1.8  --    Liver Function Tests:  Recent Labs Lab 04/28/14 1247 04/30/14 0332 05/01/14 0319  AST 13 13 15   ALT 8 9 13   ALKPHOS 73 59 70  BILITOT 0.3 <0.2* <0.2*  PROT 7.4 6.4 6.9  ALBUMIN 3.1* 2.5* 2.6*   No results found for this basename: LIPASE, AMYLASE,  in the last 168 hours No results found for this basename: AMMONIA,  in the last 168 hours CBC:  Recent Labs Lab 04/28/14 1247 04/29/14 1044 04/30/14 0332 05/01/14 0319  WBC 12.6* 7.3 6.3 3.6*  NEUTROABS 11.3*  --  4.5 2.9  HGB 13.1 10.5* 11.1* 10.7*  HCT 37.7 31.8* 32.7* 32.1*  MCV 91.5 93.3 91.9 89.7  PLT 223 157 159 165   Cardiac Enzymes: No results found for this basename: CKTOTAL, CKMB, CKMBINDEX, TROPONINI,  in the last 168 hours BNP (last 3 results) No results found for this basename: PROBNP,  in the last 8760  hours CBG: No results found for this basename: GLUCAP,  in the last 168 hours  Recent Results (from the past 240 hour(s))  CULTURE, BLOOD (ROUTINE X 2)     Status: None   Collection Time    04/28/14 12:47 PM      Result Value Ref Range Status   Specimen Description BLOOD RIGHT WRIST   Final   Special Requests     Final   Value: BOTTLES DRAWN AEROBIC AND ANAEROBIC BLUE 10CC RED 6CC   Culture  Setup Time     Final   Value: 04/28/2014 17:04     Performed at [facility]   Culture     Final   Value:        BLOOD CULTURE RECEIVED NO GROWTH TO DATE CULTURE WILL BE HELD FOR 5 DAYS BEFORE ISSUING A FINAL NEGATIVE REPORT     Performed at [facility]   Report Status PENDING   Incomplete  CULTURE, BLOOD (ROUTINE X 2)     Status:  None   Collection Time    04/28/14 12:52 PM      Result Value Ref Range Status   Specimen Description BLOOD RIGHT FOREARM   Final   Special Requests BOTTLES DRAWN AEROBIC AND ANAEROBIC 10CC   Final   Culture  Setup Time     Final   Value: 04/28/2014 17:04     Performed at [facility]   Culture     Final   Value:        BLOOD CULTURE RECEIVED NO GROWTH TO DATE CULTURE WILL BE HELD FOR 5 DAYS BEFORE ISSUING A FINAL NEGATIVE REPORT     Performed at [facility]   Report Status PENDING   Incomplete  URINE CULTURE     Status: None   Collection Time    04/28/14  1:47 PM      Result Value Ref Range Status   Specimen Description URINE, CATHETERIZED   Final   Special Requests NONE   Final   Culture  Setup Time     Final   Value: 04/28/2014 20:37     Performed at [facility]     Final   Value: NO GROWTH     Performed at [facility]   Culture     Final   Value: NO GROWTH     Performed at [facility]   Report Status 04/29/2014 FINAL   Final  MRSA PCR SCREENING     Status: None   Collection Time    04/28/14  7:47 PM      Result Value Ref Range Status   MRSA by PCR NEGATIVE  NEGATIVE Final    Comment:            The GeneXpert MRSA Assay (FDA     approved for NASAL specimens     only), is one component of a     comprehensive MRSA colonization     surveillance program. It is not     intended to diagnose MRSA     infection nor to guide or     monitor treatment for     MRSA infections.  CLOSTRIDIUM DIFFICILE BY PCR     Status: None   Collection Time    04/30/14  9:41 AM      Result Value Ref Range Status   C difficile by pcr NEGATIVE  NEGATIVE Final     Studies:  Recent x-ray studies have been reviewed in detail by the Attending Physician  Scheduled Meds:  Scheduled Meds: . azithromycin  500 mg Oral Daily  . benazepril  10 mg Oral Daily  . cefTRIAXone (ROCEPHIN)  IV  1 g Intravenous Q24H  . dextromethorphan-guaiFENesin  1 tablet Oral BID  . ipratropium-albuterol  3 mL Nebulization Q12H  . levothyroxine  125 mcg Oral QAC breakfast  . magnesium sulfate 1 - 4 g bolus IVPB  2 g Intravenous Once  . methylPREDNISolone (SOLU-MEDROL) injection  60 mg Intravenous Daily  . oseltamivir  30 mg Oral BID  . rivaroxaban  15 mg Oral Q supper  . sodium chloride  3 mL Intravenous Q12H    Time spent on care of this patient: 40 mins   Allie Bossier , MD   [facility] Office  [phone] Pager [phone]  On-Call/Text Page:      [email or web address]      password [email or web address]  If 7PM-7AM, please contact night-coverage [email or web address] Password [facility] 05/01/2014, 4:28 PM  LOS: 3 days

## 2014-05-01 NOTE — Progress Notes (Signed)
Physical Therapy Evaluation Patient Details Name: Maria Washington MRN: [id number] DOB: 05-05-1929 Today's Date: 05/01/2014   History of Present Illness  Patient is an 80 yo female admitted 04/28/14 with sepsis with hypotension, CAP, AKI.  Patient with h/o COPD, HTN, 2nd degree HB, A-fib.  Clinical Impression  Patient presents with problems listed below.  Will benefit from acute PT to maximize independence prior to discharge.  Do not anticipate any f/u PT needs.  Did very well today with mobility/gait.    Follow Up Recommendations No PT follow up;Supervision - Intermittent    Equipment Recommendations  None recommended by PT    Recommendations for Other Services       Precautions / Restrictions Precautions Precautions: Fall Restrictions Weight Bearing Restrictions: No      Mobility  Bed Mobility Overal bed mobility: Needs Assistance Bed Mobility: Supine to Sit     Supine to sit: Min assist     General bed mobility comments: Min assist to raise trunk to sitting position.  Good sitting balance once upright.  Transfers Overall transfer level: Needs assistance Equipment used: Rolling walker (2 wheeled) Transfers: Sit to/from Stand Sit to Stand: Min guard         General transfer comment: Cues for hand placement and safety.  No physical assist needed.  Ambulation/Gait Ambulation/Gait assistance: Min guard Ambulation Distance (Feet): 200 Feet Assistive device: Rolling walker (2 wheeled) Gait Pattern/deviations: Step-through pattern;Decreased stride length Gait velocity: Decreased Gait velocity interpretation: Below normal speed for age/gender General Gait Details: Cues for safe use of RW.  Good gait pattern and speed for safety.  RW improves balance/safety.  O2 sats remained at or above 98% with gait.  Stairs            Wheelchair Mobility    Modified Rankin (Stroke Patients Only)       Balance Overall balance assessment: Needs assistance          Standing balance support: Single extremity supported;During functional activity Standing balance-Leahy Scale: Fair Standing balance comment: Somewhat unsteady without RW in standing/gait.  Reaching for wall/furniture.                             Pertinent Vitals/Pain O2 sats remained at/above 98% with ambulation on room air.    Home Living Family/patient expects to be discharged to:: Private residence Living Arrangements: Children (Son) Available Help at Discharge: Family;Available PRN/intermittently (Close to 24 hours; sister and children live nearby. ) Type of Home: House Home Access: Stairs to enter Entrance Stairs-Rails: Right Entrance Stairs-Number of Steps: 4 Home Layout: One level Home Equipment: Walker - 4 wheels;Bedside commode;Shower seat      Prior Function Level of Independence: Independent (Uses RW longer distances)         Comments: Patient independent and active.  Drives     Hand Dominance        Extremity/Trunk Assessment   Upper Extremity Assessment: Overall WFL for tasks assessed           Lower Extremity Assessment: Generalized weakness      Cervical / Trunk Assessment: Normal  Communication   Communication: No difficulties  Cognition Arousal/Alertness: Awake/alert Behavior During Therapy: WFL for tasks assessed/performed Overall Cognitive Status: Within Functional Limits for tasks assessed                      General Comments      Exercises  Assessment/Plan    PT Assessment Patient needs continued PT services  PT Diagnosis Abnormality of gait;Generalized weakness   PT Problem List Decreased strength;Decreased balance;Decreased mobility;Decreased knowledge of use of DME;Cardiopulmonary status limiting activity  PT Treatment Interventions DME instruction;Gait training;Stair training;Functional mobility training;Therapeutic exercise;Balance training;Patient/family education   PT Goals (Current goals can  be found in the Care Plan section) Acute Rehab PT Goals Patient Stated Goal: To go home PT Goal Formulation: With patient/family Time For Goal Achievement: 05/08/14 Potential to Achieve Goals: Good    Frequency Min 3X/week   Barriers to discharge        Co-evaluation               End of Session Equipment Utilized During Treatment: Gait belt Activity Tolerance: Patient tolerated treatment well Patient left: in bed;with call bell/phone within reach;with family/visitor present (Sitting EOB) Nurse Communication: Mobility status (O2 sat level on RA)         Time: 3646-8032 PT Time Calculation (min): 24 min   Charges:   PT Evaluation $Initial PT Evaluation Tier I: 1 Procedure PT Treatments $Gait Training: 8-22 mins   PT G Codes:          Despina Pole 2014/05/29, 5:43 PM Carita Pian. Sanjuana Kava, [place] Pager [phone]

## 2014-05-01 NOTE — Progress Notes (Signed)
Pt arrived from [facility]C04 to [place]. Pt is A&Ox4. Pt's daughter at bedside. Pt lives at home with son. Pt's skin is warm, dry and intact. Pt oriented to room and unit. Will continue to monitor. Ranelle Oyster, RN

## 2014-05-01 NOTE — Progress Notes (Addendum)
RT Note: Pt feels she does not need treatments Q6, BBS clear. RT changed to BID with PRN order, pt stated she would call if needed treatment. RT will continue to monitor

## 2014-05-02 LAB — BASIC METABOLIC PANEL
BUN: 12 mg/dL (ref 6–23)
CHLORIDE: 104 meq/L (ref 96–112)
CO2: 18 mEq/L — ABNORMAL LOW (ref 19–32)
CREATININE: 0.66 mg/dL (ref 0.50–1.10)
Calcium: 8.9 mg/dL (ref 8.4–10.5)
GFR calc Af Amer: >90 mL/min (ref >90)
GFR calc non Af Amer: 79 mL/min — ABNORMAL LOW (ref >90)
Glucose, Bld: 168 mg/dL — ABNORMAL HIGH (ref 70–99)
Potassium: 3.8 mEq/L (ref 3.7–5.3)
Sodium: 138 mEq/L (ref 137–147)

## 2014-05-02 LAB — COMPREHENSIVE METABOLIC PANEL
ALT: 14 U/L (ref 0–35)
AST: 13 U/L (ref 0–37)
Albumin: 2.6 g/dL — ABNORMAL LOW (ref 3.5–5.2)
Alkaline Phosphatase: 76 U/L (ref 39–117)
BUN: 14 mg/dL (ref 6–23)
CALCIUM: 8.6 mg/dL (ref 8.4–10.5)
CO2: 18 mEq/L — ABNORMAL LOW (ref 19–32)
Chloride: 109 mEq/L (ref 96–112)
Creatinine, Ser: 0.71 mg/dL (ref 0.50–1.10)
GFR calc non Af Amer: 78 mL/min — ABNORMAL LOW (ref >90)
GFR, EST AFRICAN AMERICAN: 90 mL/min — AB (ref >90)
GLUCOSE: 134 mg/dL — AB (ref 70–99)
Potassium: 4 mEq/L (ref 3.7–5.3)
Sodium: 139 mEq/L (ref 137–147)
Total Protein: 6.7 g/dL (ref 6.0–8.3)

## 2014-05-02 LAB — CBC WITH DIFFERENTIAL/PLATELET
BASOS PCT: 0 % (ref 0–1)
Basophils Absolute: 0 10*3/uL (ref 0.0–0.1)
Eosinophils Absolute: 0 10*3/uL (ref 0.0–0.7)
Eosinophils Relative: 0 % (ref 0–5)
HCT: 30.9 % — ABNORMAL LOW (ref 36.0–46.0)
HEMOGLOBIN: 10.8 g/dL — AB (ref 12.0–15.0)
LYMPHS ABS: 1.4 10*3/uL (ref 0.7–4.0)
Lymphocytes Relative: 12 % (ref 12–46)
MCH: 31.5 pg (ref 26.0–34.0)
MCHC: 35 g/dL (ref 30.0–36.0)
MCV: 90.1 fL (ref 78.0–100.0)
MONOS PCT: 8 % (ref 3–12)
Monocytes Absolute: 0.9 10*3/uL (ref 0.1–1.0)
NEUTROS ABS: 9.6 10*3/uL — AB (ref 1.7–7.7)
Neutrophils Relative %: 80 % — ABNORMAL HIGH (ref 43–77)
PLATELETS: 205 10*3/uL (ref 150–400)
RBC: 3.43 MIL/uL — ABNORMAL LOW (ref 3.87–5.11)
RDW: 14.8 % (ref 11.5–15.5)
WBC: 11.9 10*3/uL — ABNORMAL HIGH (ref 4.0–10.5)

## 2014-05-02 LAB — MAGNESIUM: Magnesium: 2.1 mg/dL (ref 1.5–2.5)

## 2014-05-02 MED ORDER — DILTIAZEM HCL ER COATED BEADS 180 MG PO CP24
180.0000 mg | ORAL_CAPSULE | Freq: Every day | ORAL | Status: DC
Start: 2014-05-02 — End: 2014-05-03
  Administered 2014-05-02 – 2014-05-03 (×2): 180 mg via ORAL
  Filled 2014-05-02 (×2): qty 1

## 2014-05-02 MED ORDER — PREDNISONE 20 MG PO TABS
30.0000 mg | ORAL_TABLET | Freq: Every day | ORAL | Status: DC
  Administered 2014-05-03: 30 mg via ORAL
  Filled 2014-05-02 (×2): qty 1

## 2014-05-02 MED ORDER — METHYLPREDNISOLONE SODIUM SUCC 40 MG IJ SOLR: 20.0000 mg | Freq: Two times a day (BID) | INTRAMUSCULAR | Status: DC

## 2014-05-02 NOTE — Discharge Instructions (Addendum)
·   Your blood pressure medication benazepril (lotensin) has been reduced from 20 mg to 10 mg.  You have completed your antibiotics in the hospital.   It does not make sense to be on Detrol while you are taking lasix/furosemide.  Detrol has been discontinued.  You have been changed from Pradaxa to Eliquis 5 mg twice a day.  Eliquis is very safe for afib.

## 2014-05-02 NOTE — Progress Notes (Signed)
PATIENT DETAILS Name: Maria Washington Age: 80 y.o. Sex: female Date of Birth: July 12, 1929 Admit Date: 04/28/2014 Admitting Physician Kelvin Cellar, MD ALP:[id number] Marcello Moores, MD  Subjective: No major complaints, feels a lot better  Assessment/Plan: Sepsis -resolved. -thought to be secondary to CAP -blood and urine culture on 5/21-negative  Acute Hypoxic Resp Failure -resolved -likely secondary to PNA  Suspected Comm Acquired PNA -although CXR not showing overt consolidation, given clinical presentation-treated empirically for CAP -on Rocephin/Zithromax since 5/21, On Tamiflu sonce 5/22-continue-clinically improved -resp virus panel still pending, blood cultures negative  ARF -resolved -likely pre-renal  COPD-with mild exacerbation -lungs clear, stop solumedrol, transition to prednisone-taper rapidly  Diarrhea -resolved -C Diff PCR and Stool culture neg  Dehydration  -Resolved  Chronic Diastolic CHF -compensated -stop IVF -c/w Benazepril  Afib -resume Cardizem -previously on Pradaxa-given GFR on presentation switched to Xarelto -stable at present  Hypothyroidism  -cont Synthroid   Disposition: Remain inpatient  DVT Prophylaxis: On Xarelto  Code Status: Full code or DNR  Family Communication None  Procedures:  None  CONSULTS:  None  Time spent 40 minutes-which includes 50% of the time with face-to-face with patient/ family and coordinating care related to the above assessment and plan.    MEDICATIONS: Scheduled Meds: . azithromycin  500 mg Oral Daily  . benazepril  10 mg Oral Daily  . carvedilol  3.125 mg Oral BID WC  . cefTRIAXone (ROCEPHIN)  IV  1 g Intravenous Q24H  . dextromethorphan-guaiFENesin  1 tablet Oral BID  . ipratropium-albuterol  3 mL Nebulization Q12H  . levothyroxine  125 mcg Oral QAC breakfast  . methylPREDNISolone (SOLU-MEDROL) injection  60 mg Intravenous Daily  . oseltamivir  30 mg Oral BID  .  rivaroxaban  15 mg Oral Q supper  . sodium chloride  3 mL Intravenous Q12H  . vitamin B-12  1,000 mcg Oral Daily   Continuous Infusions:  PRN Meds:.acetaminophen, acetaminophen, alum & mag hydroxide-simeth, ipratropium-albuterol, loperamide, morphine injection, ondansetron (ZOFRAN) IV, ondansetron, oxyCODONE  Antibiotics: Anti-infectives   Start     Dose/Rate Route Frequency Ordered Stop   05/01/14 1300  azithromycin (ZITHROMAX) tablet 500 mg     500 mg Oral Daily 04/30/14 1417     04/29/14 2200  oseltamivir (TAMIFLU) capsule 30 mg     30 mg Oral 2 times daily 04/29/14 2033     04/29/14 1300  cefTRIAXone (ROCEPHIN) 1 g in dextrose 5 % 50 mL IVPB  Status:  Discontinued     1 g 100 mL/hr over 30 Minutes Intravenous Every 24 hours 04/28/14 1323 04/28/14 2042   04/29/14 1300  azithromycin (ZITHROMAX) 500 mg in dextrose 5 % 250 mL IVPB  Status:  Discontinued     500 mg 250 mL/hr over 60 Minutes Intravenous Every 24 hours 04/28/14 1323 04/28/14 2042   04/29/14 1300  azithromycin (ZITHROMAX) 500 mg in dextrose 5 % 250 mL IVPB     500 mg 250 mL/hr over 60 Minutes Intravenous Every 24 hours 04/28/14 2042 04/30/14 1443   04/29/14 1200  cefTRIAXone (ROCEPHIN) 1 g in dextrose 5 % 50 mL IVPB     1 g 100 mL/hr over 30 Minutes Intravenous Every 24 hours 04/28/14 2042     04/29/14 1000  oseltamivir (TAMIFLU) capsule 75 mg  Status:  Discontinued     75 mg Oral 2 times daily 04/29/14 0902 04/29/14 0908   04/29/14 1000  oseltamivir (TAMIFLU) capsule 30 mg  Status:  Discontinued     30 mg Oral Daily 04/29/14 0908 04/29/14 2033   04/28/14 1300  cefTRIAXone (ROCEPHIN) 1 g in dextrose 5 % 50 mL IVPB     1 g 100 mL/hr over 30 Minutes Intravenous  Once 04/28/14 1248 04/28/14 1457   04/28/14 1300  azithromycin (ZITHROMAX) 500 mg in dextrose 5 % 250 mL IVPB     500 mg 250 mL/hr over 60 Minutes Intravenous  Once 04/28/14 1248 04/28/14 1458       PHYSICAL EXAM: Vital signs in last 24 hours: Filed  Vitals:   05/01/14 2010 05/01/14 2316 05/02/14 0609 05/02/14 0915  BP: 173/80 137/64 160/62 167/68  Pulse: 62 73 68   Temp: 97.5 F (36.4 C)  97.7 F (36.5 C)   TempSrc: Oral  Oral   Resp: 20  20   Height:      Weight: 78.5 kg (173 lb 1 oz)  78.5 kg (173 lb 1 oz)   SpO2: 99%  97%     Weight change:  Filed Weights   04/29/14 0300 05/01/14 2010 05/02/14 0609  Weight: 79.6 kg (175 lb 7.8 oz) 78.5 kg (173 lb 1 oz) 78.5 kg (173 lb 1 oz)   Body mass index is 31.65 kg/(m^2).   Gen Exam: Awake and alert with clear speech.   Neck: Supple, No JVD.   Chest: B/L Clear.   CVS: S1 S2 Regular, no murmurs.  Abdomen: soft, BS +, non tender, non distended.  Extremities: no edema, lower extremities warm to touch. Neurologic: Non Focal.   Skin: No Rash.   Wounds: N/A.   Intake/Output from previous day:  Intake/Output Summary (Last 24 hours) at 05/02/14 1247 Last data filed at 05/02/14 5974  Gross per 24 hour  Intake 2098.33 ml  Output      0 ml  Net 2098.33 ml     LAB RESULTS: CBC  Recent Labs Lab 04/28/14 1247 04/29/14 1044 04/30/14 0332 05/01/14 0319 05/02/14 0434  WBC 12.6* 7.3 6.3 3.6* 11.9*  HGB 13.1 10.5* 11.1* 10.7* 10.8*  HCT 37.7 31.8* 32.7* 32.1* 30.9*  PLT 223 157 159 165 205  MCV 91.5 93.3 91.9 89.7 90.1  MCH 31.8 30.8 31.2 29.9 31.5  MCHC 34.7 33.0 33.9 33.3 35.0  RDW 14.3 14.7 14.5 14.5 14.8  LYMPHSABS 0.6*  --  1.0 0.5* 1.4  MONOABS 0.7  --  0.7 0.1 0.9  EOSABS 0.0  --  0.1 0.0 0.0  BASOSABS 0.0  --  0.0 0.0 0.0    Chemistries   Recent Labs Lab 04/28/14 1247 04/29/14 1044 04/30/14 0332 04/30/14 1315 05/01/14 0319 05/02/14 0434  NA 138 137 141  --  139 139  K 3.9 2.8* 3.0*  --  4.7 4.0  CL 100 106 111  --  108 109  CO2 20 18* 17*  --  18* 18*  GLUCOSE 144* 182* 114*  --  198* 134*  BUN 13 9 7   --  5* 14  CREATININE 1.64* 0.97 0.83  --  0.68 0.71  CALCIUM 9.4 8.2* 8.3*  --  8.9 8.6  MG  --  1.8  --  1.8  --   --     CBG: No results  found for this basename: GLUCAP,  in the last 168 hours  GFR Estimated Creatinine Clearance: 51.7 ml/min (by C-G formula based on Cr of 0.71).  Coagulation profile No results found for this basename: INR, PROTIME,  in the last 168 hours  Cardiac  Enzymes No results found for this basename: CK, CKMB, TROPONINI, MYOGLOBIN,  in the last 168 hours  No components found with this basename: POCBNP,  No results found for this basename: DDIMER,  in the last 72 hours No results found for this basename: HGBA1C,  in the last 72 hours No results found for this basename: CHOL, HDL, LDLCALC, TRIG, CHOLHDL, LDLDIRECT,  in the last 72 hours No results found for this basename: TSH, T4TOTAL, FREET3, T3FREE, THYROIDAB,  in the last 72 hours No results found for this basename: VITAMINB12, FOLATE, FERRITIN, TIBC, IRON, RETICCTPCT,  in the last 72 hours No results found for this basename: LIPASE, AMYLASE,  in the last 72 hours  Urine Studies No results found for this basename: UACOL, UAPR, USPG, UPH, UTP, UGL, UKET, UBIL, UHGB, UNIT, UROB, ULEU, UEPI, UWBC, URBC, UBAC, CAST, CRYS, UCOM, BILUA,  in the last 72 hours  MICROBIOLOGY: Recent Results (from the past 240 hour(s))  CULTURE, BLOOD (ROUTINE X 2)     Status: None   Collection Time    04/28/14 12:47 PM      Result Value Ref Range Status   Specimen Description BLOOD RIGHT WRIST   Final   Special Requests     Final   Value: BOTTLES DRAWN AEROBIC AND ANAEROBIC BLUE 10CC RED 6CC   Culture  Setup Time     Final   Value: 04/28/2014 17:04     Performed at [facility]   Culture     Final   Value:        BLOOD CULTURE RECEIVED NO GROWTH TO DATE CULTURE WILL BE HELD FOR 5 DAYS BEFORE ISSUING A FINAL NEGATIVE REPORT     Performed at [facility]   Report Status PENDING   Incomplete  CULTURE, BLOOD (ROUTINE X 2)     Status: None   Collection Time    04/28/14 12:52 PM      Result Value Ref Range Status   Specimen Description BLOOD RIGHT  FOREARM   Final   Special Requests BOTTLES DRAWN AEROBIC AND ANAEROBIC 10CC   Final   Culture  Setup Time     Final   Value: 04/28/2014 17:04     Performed at [facility]   Culture     Final   Value:        BLOOD CULTURE RECEIVED NO GROWTH TO DATE CULTURE WILL BE HELD FOR 5 DAYS BEFORE ISSUING A FINAL NEGATIVE REPORT     Performed at [facility]   Report Status PENDING   Incomplete  URINE CULTURE     Status: None   Collection Time    04/28/14  1:47 PM      Result Value Ref Range Status   Specimen Description URINE, CATHETERIZED   Final   Special Requests NONE   Final   Culture  Setup Time     Final   Value: 04/28/2014 20:37     Performed at [facility]     Final   Value: NO GROWTH     Performed at [facility]   Culture     Final   Value: NO GROWTH     Performed at [facility]   Report Status 04/29/2014 FINAL   Final  MRSA PCR SCREENING     Status: None   Collection Time    04/28/14  7:47 PM      Result Value Ref Range Status  MRSA by PCR NEGATIVE  NEGATIVE Final   Comment:            The GeneXpert MRSA Assay (FDA     approved for NASAL specimens     only), is one component of a     comprehensive MRSA colonization     surveillance program. It is not     intended to diagnose MRSA     infection nor to guide or     monitor treatment for     MRSA infections.  STOOL CULTURE     Status: None   Collection Time    04/30/14  9:41 AM      Result Value Ref Range Status   Specimen Description STOOL   Final   Special Requests NONE   Final   Culture     Final   Value: NO GROWTH 1 DAY     Note: REDUCED NORMAL FLORA PRESENT     Performed at [facility]   Report Status PENDING   Incomplete  CLOSTRIDIUM DIFFICILE BY PCR     Status: None   Collection Time    04/30/14  9:41 AM      Result Value Ref Range Status   C difficile by pcr NEGATIVE  NEGATIVE Final    RADIOLOGY STUDIES/RESULTS: X-ray Chest Pa And  Lateral   04/29/2014   CLINICAL DATA:  Fever and chills  EXAM: CHEST  2 VIEW  COMPARISON:  04/28/2014  FINDINGS: Cardiac shadow is stable. A pacemaker is again identified. The lungs are well aerated with mild interstitial changes but stable. No acute infiltrate or sizable effusion is seen.  IMPRESSION: No acute abnormality noted.   Electronically Signed   By: Inez Catalina M.D.   On: 04/29/2014 08:37   Dg Chest Port 1 View  04/28/2014   CLINICAL DATA:  Dizziness and low blood pressure  EXAM: PORTABLE CHEST - 1 VIEW  COMPARISON:  12/05/2011  FINDINGS: The pacer wires are stable. The cardiac silhouette, mediastinal and hilar contours are within normal limits and stable. The lungs are clear. No pleural effusion. The bony thorax is intact.  IMPRESSION: No acute cardiopulmonary findings.   Electronically Signed   By: Kalman Jewels M.D.   On: 04/28/2014 13:40    Makita Blow Kristeen Mans, MD  [facility] Pager:[phone]  If 7PM-7AM, please contact night-coverage [email or web address] Password [email or web address] 05/02/2014, 12:47 PM   LOS: 4 days   **Disclaimer: This note may have been dictated with voice recognition software. Similar sounding words can inadvertently be transcribed and this note may contain transcription errors which may not have been corrected upon publication of note.**

## 2014-05-02 NOTE — Evaluation (Signed)
Occupational Therapy Evaluation Patient Details Name: LEXINE JASPERS MRN: [id number] DOB: [birth year] Today's Date: 05/02/2014    History of Present Illness Patient is an 80 yo female admitted 04/28/14 with sepsis with hypotension, CAP, AKI.  Patient with h/o COPD, HTN, 2nd degree HB, A-fib.   Clinical Impression   Pt moving well during session. OT provided education and pt verbalized she felt good about information covered. No further OT needs.      Follow Up Recommendations  No OT follow up;Supervision - Intermittent    Equipment Recommendations  None recommended by OT    Recommendations for Other Services       Precautions / Restrictions Precautions Precautions: Fall Restrictions Weight Bearing Restrictions: No      Mobility Bed Mobility Overal bed mobility: Modified Independent                Transfers Overall transfer level: Needs assistance Equipment used: Rolling walker (2 wheeled) Transfers: Sit to/from Stand Sit to Stand: Min guard              Balance                                            ADL Overall ADL's : Needs assistance/impaired     Grooming: Oral care;Standing;Supervision/safety;Set up;Wash/dry face               Lower Body Dressing: Min guard;Sit to/from stand   Toilet Transfer: Min guard;Ambulation;RW;Supervision/safety (ambulated with and without RW; bed/chair)       Tub/ Shower Transfer: Minimal assistance;Ambulation;Shower seat;Rolling walker   Functional mobility during ADLs: Min guard;Rolling walker;Supervision/safety;Minimal assistance (ambulated with and without RW; Min A for shower transfer) General ADL Comments: Recommended pt use RW-more steady with walker. Educated on energy conservation techniques. Educated on tub transfer and pt practiced. Recommended someone be with her for tub transfer. Recommended sitting for most of LB ADLs. Educated on safe shoewear, rugs, bag on walker.     Vision                      Perception     Praxis      Pertinent Vitals/Pain No pain reported.      Hand Dominance     Extremity/Trunk Assessment Upper Extremity Assessment Upper Extremity Assessment: Overall WFL for tasks assessed   Lower Extremity Assessment Lower Extremity Assessment: Defer to PT evaluation       Communication Communication Communication: No difficulties   Cognition Arousal/Alertness: Awake/alert Behavior During Therapy: WFL for tasks assessed/performed Overall Cognitive Status: Within Functional Limits for tasks assessed                     General Comments       Exercises       Shoulder Instructions      Home Living Family/patient expects to be discharged to:: Private residence Living Arrangements: Children (son) Available Help at Discharge: Family;Available PRN/intermittently (close to 24 hours; sister and children live nearby) Type of Home: House Home Access: Stairs to enter [employer] of Steps: 4 Entrance Stairs-Rails: Right Home Layout: One level     Bathroom Shower/Tub: Tub/shower unit;Walk-in shower   Bathroom Toilet: Handicapped height     Home Equipment: Environmental consultant - 4 wheels;Bedside commode;Shower seat          Prior Functioning/Environment Level of Independence: Independent (uses  RW longer distances)        Comments: Patient independent and active.  Drives    OT Diagnosis:     OT Problem List:     OT Treatment/Interventions:      OT Goals(Current goals can be found in the care plan section)    OT Frequency:     Barriers to D/C:            Co-evaluation              End of Session Equipment Utilized During Treatment: Gait belt;Rolling walker  Activity Tolerance: Patient tolerated treatment well Patient left: in bed;with call bell/phone within reach   Time: 1645-1714 OT Time Calculation (min): 29 min Charges:  OT General Charges $OT Visit: 1 Procedure OT Evaluation $Initial  OT Evaluation Tier I: 1 Procedure OT Treatments $Self Care/Home Management : 8-22 mins G-Codes:    Benito Mccreedy OTR/L [phone] 05/02/2014, 7:13 PM

## 2014-05-03 LAB — RESPIRATORY VIRUS PANEL
Adenovirus: NOT DETECTED
INFLUENZA B 1: NOT DETECTED
Influenza A H1: NOT DETECTED
Influenza A H3: NOT DETECTED
Influenza A: NOT DETECTED
METAPNEUMOVIRUS: NOT DETECTED
Parainfluenza 1: NOT DETECTED
Parainfluenza 2: NOT DETECTED
Parainfluenza 3: NOT DETECTED
RESPIRATORY SYNCYTIAL VIRUS A: NOT DETECTED
Respiratory Syncytial Virus B: NOT DETECTED
Rhinovirus: NOT DETECTED

## 2014-05-03 MED ORDER — DM-GUAIFENESIN ER 30-600 MG PO TB12: 1.0000 | ORAL_TABLET | Freq: Two times a day (BID) | ORAL | Status: DC

## 2014-05-03 MED ORDER — PREDNISONE 10 MG PO TABS: 30.0000 mg | ORAL_TABLET | Freq: Every day | ORAL | Status: DC

## 2014-05-03 MED ORDER — APIXABAN 5 MG PO TABS: 5.0000 mg | ORAL_TABLET | Freq: Two times a day (BID) | ORAL | Status: DC

## 2014-05-03 MED ORDER — BENAZEPRIL HCL 10 MG PO TABS: 10.0000 mg | ORAL_TABLET | Freq: Every day | ORAL | Status: DC

## 2014-05-03 MED ORDER — LEVOTHYROXINE SODIUM 125 MCG PO TABS
125.0000 ug | ORAL_TABLET | Freq: Every day | ORAL | Status: DC
  Administered 2014-05-03: 125 ug via ORAL
  Filled 2014-05-03 (×2): qty 1

## 2014-05-03 NOTE — Discharge Summary (Signed)
Physician Discharge Summary  Maria Washington WYO:[id number] DOB: [birth year] DOA: 04/28/2014  PCP: Mathews Argyle, MD  Admit date: 04/28/2014 Discharge date: 05/03/2014  Time spent: 50 minutes  Recommendations for Outpatient Follow-up:  1. Changed from Pradaxa to Eliquis for Afib anticoagulation. 2. BP medication (benazepril) decreased from 20 mg to 10 mg daily. 3. CBC, BMET on follow up.  Patient with CAP and Acute renal failure.    Discharge Diagnoses:  Principal Problem:   CAP (community acquired pneumonia) Active Problems:   Hypertension   COPD (chronic obstructive pulmonary disease)   Hypothyroidism   Pacemaker   Atrial fibrillation   Sepsis with hypotension   AKI (acute kidney injury)   Acute respiratory failure with hypoxia   Dehydration   Diarrhea   Diastolic CHF   Discharge Condition: stable.  Diet recommendation: heart healthy.  Filed Weights   05/01/14 2010 05/02/14 0609 05/03/14 0536  Weight: 78.5 kg (173 lb 1 oz) 78.5 kg (173 lb 1 oz) 79.5 kg (175 lb 4.3 oz)    History of present illness:  Maria Washington is a 80 y.o. female with a past medical history of COPD, hypertension, hypothyroidism presenting to the emergency room with complaints of cough and shortness of breath. She reports having progressive cough, shortness of breath, associated white sputum production, fevers and chills for the past 2 weeks. Symptoms becoming worse over the past several days as she has had associated generalized weakness, malaise, fatigue, increasing shortness of breath, functional decline, diarrhea and poor appetite. Family reporting temperatures as high as 103 at home. She saw her primary care provider last Tuesday at which time she was given a prescription for Bactrim. Symptoms getting worse despite taking oral antibiotics. She followed up with her PCP today for the hypotensive in the office and referred immediately to the emergency department. On presentation she had a blood  pressure of 70/40, improving to 3/80 after IV fluid resuscitation. Patient was given a dose of IV ceftriaxone and azithromycin. Labs showed white count of 12,600, creatinine 1.64 with lactic acid of 2.5.   Hospital Course:  Sepsis  -resolved.  -thought to be secondary to CAP  -blood and urine culture on 5/21-negative   Acute Hypoxic Resp Failure  -resolved  -likely secondary to PNA   Suspected Comm Acquired PNA  -although CXR not showing overt consolidation, given clinical presentation- she was treated empirically for CAP  -on Rocephin/Zithromax  5/21-26, On Tamiflu  5/22- 5/26.  clinically improved. She will not be discharged on any further antimicrobial agents on discharge, as has completed 5 days of Tamiflu and 6 days of Rocephin and Zithromax -resp virus panel still pending, blood cultures negative .  ARF  -resolved  -likely pre-renal - dehydration, lasix, and bactrim. -Resume lasix at D/C -Follow up bmet in 1 week at PCP office.  COPD-with mild exacerbation  -lungs clear, treated with IV solumedrol inpatient.  Quickly transitioned to prednisone taper.  Diarrhea  -resolved  -C Diff PCR and Stool culture neg   Dehydration  -Resolved.  Lasix was held inpatient but will be resumed at discharge.  Chronic Diastolic CHF  -compensated  -stop IVF  -c/w Benazepril at decreased dose. -Lasix resumed at discharge.  Afib  -rate controlled -resume Cardizem  -previously on Pradaxa-given GFR on presentation switched to [employer] spoke with patient and family-agreeable. Case Management will check for insurance issues prior to discharge. -stable at present   Hypothyroidism  -cont Synthroid    Discharge Exam: Filed Vitals:  05/03/14 1028  BP: 162/69  Pulse: 60  Temp:   Resp:    Gen Exam: Awake and alert with clear speech. Ambulating with PT. Neck: Supple, No JVD.  Chest: B/L Clear.  No increased work of breathing CVS: S1 S2 Regular, no murmurs.  Abdomen: soft,  BS +, non tender, non distended.  Extremities: no edema, lower extremities warm to touch.  Neurologic: Non Focal.  Skin: No Rash.    Discharge Instructions     Medication List    STOP taking these medications       dabigatran 75 MG Caps capsule  Commonly known as:  PRADAXA     sulfamethoxazole-trimethoprim 800-160 MG per tablet  Commonly known as:  BACTRIM DS     tolterodine 4 MG 24 hr capsule  Commonly known as:  DETROL LA      TAKE these medications       alendronate 70 MG tablet  Commonly known as:  FOSAMAX  Take 70 mg by mouth every 7 (seven) days. Monday     apixaban 5 MG Tabs tablet  Commonly known as:  ELIQUIS  Take 1 tablet (5 mg total) by mouth 2 (two) times daily.     benazepril 10 MG tablet  Commonly known as:  LOTENSIN  Take 1 tablet (10 mg total) by mouth daily.     BIOTIN 5000 PO  Take 5,000 mcg by mouth daily.     calcium-vitamin D 500-200 MG-UNIT per tablet  Commonly known as:  OSCAL WITH D  Take 1 tablet by mouth 2 (two) times daily.     dextromethorphan 30 MG/5ML liquid  Commonly known as:  DELSYM  Take 30 mg by mouth every 6 (six) hours as needed for cough.     dextromethorphan-guaiFENesin 30-600 MG per 12 hr tablet  Commonly known as:  MUCINEX DM  Take 1 tablet by mouth 2 (two) times daily.     diltiazem 180 MG 24 hr capsule  Commonly known as:  CARDIZEM CD  Take 180 mg by mouth daily.     Fish Oil 1200 MG Caps  Take 1,200 mg by mouth 2 (two) times daily.     folic acid 1 MG tablet  Commonly known as:  FOLVITE  Take 1 mg by mouth daily.     furosemide 40 MG tablet  Commonly known as:  LASIX  Take 40 mg by mouth See admin instructions. Take 40 mg by mouth on Monday, Wednesday, and Friday     levothyroxine 125 MCG tablet  Commonly known as:  SYNTHROID, LEVOTHROID  Take 125 mcg by mouth every morning. On an empty stomach     magic mouthwash Soln  Take 5 mLs by mouth every 5 (five) hours as needed. Gargle and swallow      mometasone 0.1 % cream  Commonly known as:  ELOCON  Apply 1 application topically daily as needed.     potassium chloride SA 20 MEQ tablet  Commonly known as:  K-DUR,KLOR-CON  Take 20 mEq by mouth 2 (two) times daily.     predniSONE 10 MG tablet  Commonly known as:  DELTASONE  Take 3 tablets (30 mg total) by mouth daily with breakfast. Take 3 tablets tomorrow with breakfast.  Then take 2 tabs with breakfast on 5/27 on 5/28.  Then take 1 tablet with breakfast until gone.     VICKS VAPOR INHALER IN  Inhale 1 puff into the lungs daily as needed (allergies).     vitamin B-12  1000 MCG tablet  Commonly known as:  CYANOCOBALAMIN  Take 1,000 mcg by mouth daily.     Vitamin D 2000 UNITS Caps  Take 2,000 Units by mouth daily.       Allergies  Allergen Reactions  . Clindamycin/Lincomycin Rash  . Penicillins Rash   Follow-up Information   Follow up with Mathews Argyle, MD In 1 week. [facility] follow up and check blood work.)    Specialty:  Internal Medicine   Contact information:   [address] [place] [address]        The results of significant diagnostics from this hospitalization (including imaging, microbiology, ancillary and laboratory) are listed below for reference.    Significant Diagnostic Studies: X-ray Chest Pa And Lateral   04/29/2014   CLINICAL DATA:  Fever and chills  EXAM: CHEST  2 VIEW  COMPARISON:  04/28/2014  FINDINGS: Cardiac shadow is stable. A pacemaker is again identified. The lungs are well aerated with mild interstitial changes but stable. No acute infiltrate or sizable effusion is seen.  IMPRESSION: No acute abnormality noted.   Electronically Signed   By: Inez Catalina M.D.   On: 04/29/2014 08:37   Dg Chest Port 1 View  04/28/2014   CLINICAL DATA:  Dizziness and low blood pressure  EXAM: PORTABLE CHEST - 1 VIEW  COMPARISON:  12/05/2011  FINDINGS: The pacer wires are stable. The cardiac silhouette, mediastinal and hilar contours are  within normal limits and stable. The lungs are clear. No pleural effusion. The bony thorax is intact.  IMPRESSION: No acute cardiopulmonary findings.   Electronically Signed   By: Kalman Jewels M.D.   On: 04/28/2014 13:40    Microbiology: Recent Results (from the past 240 hour(s))  CULTURE, BLOOD (ROUTINE X 2)     Status: None   Collection Time    04/28/14 12:47 PM      Result Value Ref Range Status   Specimen Description BLOOD RIGHT WRIST   Final   Special Requests     Final   Value: BOTTLES DRAWN AEROBIC AND ANAEROBIC BLUE 10CC RED [place]   Culture  Setup Time     Final   Value: 04/28/2014 17:04     Performed at [facility]   Culture     Final   Value:        BLOOD CULTURE RECEIVED NO GROWTH TO DATE CULTURE WILL BE HELD FOR 5 DAYS BEFORE ISSUING A FINAL NEGATIVE REPORT     Performed at [facility]   Report Status PENDING   Incomplete  CULTURE, BLOOD (ROUTINE X 2)     Status: None   Collection Time    04/28/14 12:52 PM      Result Value Ref Range Status   Specimen Description BLOOD RIGHT FOREARM   Final   Special Requests BOTTLES DRAWN AEROBIC AND ANAEROBIC 10CC   Final   Culture  Setup Time     Final   Value: 04/28/2014 17:04     Performed at [facility]   Culture     Final   Value:        BLOOD CULTURE RECEIVED NO GROWTH TO DATE CULTURE WILL BE HELD FOR 5 DAYS BEFORE ISSUING A FINAL NEGATIVE REPORT     Performed at [facility]   Report Status PENDING   Incomplete  URINE CULTURE     Status: None   Collection Time    04/28/14  1:47 PM  Result Value Ref Range Status   Specimen Description URINE, CATHETERIZED   Final   Special Requests NONE   Final   Culture  Setup Time     Final   Value: 04/28/2014 20:37     Performed at [facility]     Final   Value: NO GROWTH     Performed at [facility]   Culture     Final   Value: NO GROWTH     Performed at [facility]   Report Status 04/29/2014 FINAL    Final  MRSA PCR SCREENING     Status: None   Collection Time    04/28/14  7:47 PM      Result Value Ref Range Status   MRSA by PCR NEGATIVE  NEGATIVE Final   Comment:            The GeneXpert MRSA Assay (FDA     approved for NASAL specimens     only), is one component of a     comprehensive MRSA colonization     surveillance program. It is not     intended to diagnose MRSA     infection nor to guide or     monitor treatment for     MRSA infections.  STOOL CULTURE     Status: None   Collection Time    04/30/14  9:41 AM      Result Value Ref Range Status   Specimen Description STOOL   Final   Special Requests NONE   Final   Culture     Final   Value: NO GROWTH 2 DAYS     Note: REDUCED NORMAL FLORA PRESENT     Performed at [facility]   Report Status PENDING   Incomplete  CLOSTRIDIUM DIFFICILE BY PCR     Status: None   Collection Time    04/30/14  9:41 AM      Result Value Ref Range Status   C difficile by pcr NEGATIVE  NEGATIVE Final     Labs: Basic Metabolic Panel:  Recent Labs Lab 04/29/14 1044 04/30/14 0332 04/30/14 1315 05/01/14 0319 05/02/14 0434 05/02/14 1330  NA 137 141  --  139 139 138  K 2.8* 3.0*  --  4.7 4.0 3.8  CL 106 111  --  108 109 104  CO2 18* 17*  --  18* 18* 18*  GLUCOSE 182* 114*  --  198* 134* 168*  BUN 9 7  --  5* 14 12  CREATININE 0.97 0.83  --  0.68 0.71 0.66  CALCIUM 8.2* 8.3*  --  8.9 8.6 8.9  MG 1.8  --  1.8  --   --  2.1   Liver Function Tests:  Recent Labs Lab 04/28/14 1247 04/30/14 0332 05/01/14 0319 05/02/14 0434  AST 13 13 15 13   ALT 8 9 13 14   ALKPHOS 73 59 70 76  BILITOT 0.3 <0.2* <0.2* <0.2*  PROT 7.4 6.4 6.9 6.7  ALBUMIN 3.1* 2.5* 2.6* 2.6*   CBC:  Recent Labs Lab 04/28/14 1247 04/29/14 1044 04/30/14 0332 05/01/14 0319 05/02/14 0434  WBC 12.6* 7.3 6.3 3.6* 11.9*  NEUTROABS 11.3*  --  4.5 2.9 9.6*  HGB 13.1 10.5* 11.1* 10.7* 10.8*  HCT 37.7 31.8* 32.7* 32.1* 30.9*  MCV 91.5 93.3 91.9 89.7 90.1   PLT 223 157 159 165 205     Signed:  Melton Alar, [place] [phone]  [facility] 05/03/2014, 11:41 AM  Attending Patient seen and examined, agree with the above assessment and plan-above documentation was reviewed and necesary change were made. Much improved, no need to continue with further antibiotics on discharge. Patient agreeable to be switched to Eliquis, stable for discharge today  Nena Alexander MD

## 2014-05-03 NOTE — Progress Notes (Signed)
Maria Washington to be D/C'd Home per MD order.  Discussed with the patient and all questions fully answered.    Medication List    STOP taking these medications       dabigatran 75 MG Caps capsule  Commonly known as:  PRADAXA     sulfamethoxazole-trimethoprim 800-160 MG per tablet  Commonly known as:  BACTRIM DS     tolterodine 4 MG 24 hr capsule  Commonly known as:  DETROL LA      TAKE these medications       alendronate 70 MG tablet  Commonly known as:  FOSAMAX  Take 70 mg by mouth every 7 (seven) days. Monday     apixaban 5 MG Tabs tablet  Commonly known as:  ELIQUIS  Take 1 tablet (5 mg total) by mouth 2 (two) times daily.     benazepril 10 MG tablet  Commonly known as:  LOTENSIN  Take 1 tablet (10 mg total) by mouth daily.     BIOTIN 5000 PO  Take 5,000 mcg by mouth daily.     calcium-vitamin D 500-200 MG-UNIT per tablet  Commonly known as:  OSCAL WITH D  Take 1 tablet by mouth 2 (two) times daily.     dextromethorphan 30 MG/5ML liquid  Commonly known as:  DELSYM  Take 30 mg by mouth every 6 (six) hours as needed for cough.     dextromethorphan-guaiFENesin 30-600 MG per 12 hr tablet  Commonly known as:  MUCINEX DM  Take 1 tablet by mouth 2 (two) times daily.     diltiazem 180 MG 24 hr capsule  Commonly known as:  CARDIZEM CD  Take 180 mg by mouth daily.     Fish Oil 1200 MG Caps  Take 1,200 mg by mouth 2 (two) times daily.     folic acid 1 MG tablet  Commonly known as:  FOLVITE  Take 1 mg by mouth daily.     furosemide 40 MG tablet  Commonly known as:  LASIX  Take 40 mg by mouth See admin instructions. Take 40 mg by mouth on Monday, Wednesday, and Friday     levothyroxine 125 MCG tablet  Commonly known as:  SYNTHROID, LEVOTHROID  Take 125 mcg by mouth every morning. On an empty stomach     magic mouthwash Soln  Take 5 mLs by mouth every 5 (five) hours as needed. Gargle and swallow     mometasone 0.1 % cream  Commonly known as:  ELOCON  Apply 1  application topically daily as needed.     potassium chloride SA 20 MEQ tablet  Commonly known as:  K-DUR,KLOR-CON  Take 20 mEq by mouth 2 (two) times daily.     predniSONE 10 MG tablet  Commonly known as:  DELTASONE  Take 3 tablets (30 mg total) by mouth daily with breakfast. Take 3 tablets tomorrow with breakfast.  Then take 2 tabs with breakfast on 5/27 on 5/28.  Then take 1 tablet with breakfast until gone.     VICKS VAPOR INHALER IN  Inhale 1 puff into the lungs daily as needed (allergies).     vitamin B-12 1000 MCG tablet  Commonly known as:  CYANOCOBALAMIN  Take 1,000 mcg by mouth daily.     Vitamin D 2000 UNITS Caps  Take 2,000 Units by mouth daily.        VVS, Skin clean, dry and intact without evidence of skin break down, no evidence of skin tears noted. IV catheter discontinued  intact. Site without signs and symptoms of complications. Dressing and pressure applied.  An After Visit Summary was printed and given to the patient.  D/c education completed with patient/family including follow up instructions, medication list, d/c activities limitations if indicated, with other d/c instructions as indicated by MD - patient able to verbalize understanding, all questions fully answered.   Patient instructed to return to ED, call 911, or call MD for any changes in condition.   Patient escorted via [place], and D/C home via private auto.  Luberta Mutter [place] 05/03/2014 1:26 PM

## 2014-05-03 NOTE — Progress Notes (Signed)
Physical Therapy Treatment Patient Details Name: Maria Washington MRN: [id number] DOB: 24-Feb-1929 Today's Date: 05/03/2014    History of Present Illness Patient is an 80 yo female admitted 04/28/14 with sepsis with hypotension, CAP, AKI.  Patient with h/o COPD, HTN, 2nd degree HB, A-fib.    PT Comments    Pt was able to ambulate a total of 500 feet with 5 steps with no rest.  Pt ambulated first 100 ft with RW and supervision, then the remaining 400 ft with min guard and no AD.  Pt was able to complete the 500 ft with no LOB, but balance appeared unsteady without RW.  Pt advised to use RW for ambulation for balance and safety.  Pt ascended/descended 5 steps with R hand rail and min guard for safety.  O2 sats remained at 98% throughout therapy today on room air.  Will continue to follow.   Follow Up Recommendations        Equipment Recommendations  None recommended by PT    Recommendations for Other Services       Precautions / Restrictions Precautions Precautions: Fall Restrictions Weight Bearing Restrictions: No    Mobility  Bed Mobility Overal bed mobility: Modified Independent Bed Mobility: Supine to Sit     Supine to sit: Modified independent (Device/Increase time);HOB elevated     General bed mobility comments: Pt came to sitting at EOB upon PT arrival without being requested to do so.  HOB elevated ~30 degrees, pt did not use bed rails.  Pt performed bed mobility without need of increased time.  Transfers Overall transfer level: Modified independent Equipment used: Rolling walker (2 wheeled) Transfers: Sit to/from Stand Sit to Stand: Modified independent (Device/Increase time)         General transfer comment: Pt performed sit to/from stand transfers with no VC necessary.  Pt pushes up from EOB with one hand with other hand on RW.  Ambulation/Gait Ambulation/Gait assistance: Min guard Ambulation Distance (Feet): 500 Feet Assistive device: Rolling walker (2  wheeled) Gait Pattern/deviations: Step-through pattern;Decreased stride length Gait velocity: Decreased Gait velocity interpretation: Below normal speed for age/gender General Gait Details: O2 sats remained at 98% throughout gait.  Pt walked first 100 ft with RW and supervision, the remaining 300 ft with close supervision and no AD.  Pt instructed to continue using RW for improved balance and safety with gait when ambulating at home.   Stairs Stairs: Yes Stairs assistance: Min guard Stair Management: One rail Right;Step to pattern;Sideways;Forwards;Alternating pattern Number of Stairs: 5 General stair comments: Pt ascended stairs with step-to pattern going forward, descended first with sidestepping facing rail, then facing forward with step over step strategy for last 2 steps.  Min guard for safety with this first trial of steps.  Wheelchair Mobility    Modified Rankin (Stroke Patients Only)       Balance Overall balance assessment: Needs assistance         Standing balance support: Single extremity supported Standing balance-Leahy Scale: Fair Standing balance comment: Pt unsteady without RW in standing/gait and requires min guard for balance safety without RW.                    Cognition Arousal/Alertness: Awake/alert Behavior During Therapy: WFL for tasks assessed/performed Overall Cognitive Status: Within Functional Limits for tasks assessed                      Exercises      General Comments  Pertinent Vitals/Pain Pt reports no pain today.    Home Living                      Prior Function            PT Goals (current goals can now be found in the care plan section) Acute Rehab PT Goals Patient Stated Goal: To go home PT Goal Formulation: With patient/family Time For Goal Achievement: 05/08/14 Potential to Achieve Goals: Good Progress towards PT goals: Progressing toward goals    Frequency  Min 3X/week    PT Plan  Current plan remains appropriate    Co-evaluation             End of Session Equipment Utilized During Treatment: Gait belt Activity Tolerance: Patient tolerated treatment well Patient left: in bed;with call bell/phone within reach;with family/visitor present     Time: 9150-5697 PT Time Calculation (min): 18 min  Charges:                       G Codes:      Maria Washington, SPT 05/03/2014, 10:37 AM

## 2014-05-03 NOTE — Progress Notes (Signed)
Agree with SPT.    Maria Washington, PT [phone]  

## 2014-05-04 LAB — STOOL CULTURE

## 2014-05-04 LAB — CULTURE, BLOOD (ROUTINE X 2)
CULTURE: NO GROWTH
Culture: NO GROWTH

## 2014-05-04 LAB — FECAL LACTOFERRIN, QUANT: Fecal Lactoferrin: NEGATIVE

## 2014-05-12 ENCOUNTER — Ambulatory Visit
Admission: RE | Admit: 2014-05-12 | Discharge: 2014-05-12 | Disposition: A | Discharge status: Home / Self Care | Payer: Medicare Other | Source: Ambulatory Visit | Attending: Internal Medicine | Admitting: Internal Medicine

## 2014-05-12 ENCOUNTER — Other Ambulatory Visit: Payer: Self-pay | Admitting: Internal Medicine

## 2014-05-12 DIAGNOSIS — M542 Cervicalgia: Secondary | ICD-10-CM

## 2014-05-12 DIAGNOSIS — R29818 Other symptoms and signs involving the nervous system: Secondary | ICD-10-CM

## 2014-05-13 ENCOUNTER — Other Ambulatory Visit: Payer: Self-pay | Admitting: Internal Medicine

## 2014-05-13 ENCOUNTER — Emergency Department (HOSPITAL_COMMUNITY): Payer: Medicare Other

## 2014-05-13 ENCOUNTER — Encounter (HOSPITAL_COMMUNITY): Payer: Self-pay | Admitting: Emergency Medicine

## 2014-05-13 ENCOUNTER — Emergency Department (HOSPITAL_COMMUNITY)
Admission: EM | Admit: 2014-05-13 | Discharge: 2014-05-14 | Disposition: A | Discharge status: Home / Self Care | Payer: Medicare Other | Attending: Emergency Medicine | Admitting: Emergency Medicine

## 2014-05-13 ENCOUNTER — Ambulatory Visit
Admission: RE | Admit: 2014-05-13 | Discharge: 2014-05-13 | Disposition: A | Discharge status: Home / Self Care | Payer: Medicare Other | Source: Ambulatory Visit | Attending: Internal Medicine | Admitting: Internal Medicine

## 2014-05-13 DIAGNOSIS — R42 Dizziness and giddiness: Secondary | ICD-10-CM

## 2014-05-13 DIAGNOSIS — Z79899 Other long term (current) drug therapy: Secondary | ICD-10-CM | POA: Insufficient documentation

## 2014-05-13 DIAGNOSIS — Z880 Allergy status to penicillin: Secondary | ICD-10-CM | POA: Insufficient documentation

## 2014-05-13 DIAGNOSIS — Z862 Personal history of diseases of the blood and blood-forming organs and certain disorders involving the immune mechanism: Secondary | ICD-10-CM | POA: Insufficient documentation

## 2014-05-13 DIAGNOSIS — E039 Hypothyroidism, unspecified: Secondary | ICD-10-CM | POA: Insufficient documentation

## 2014-05-13 DIAGNOSIS — Z950 Presence of cardiac pacemaker: Secondary | ICD-10-CM | POA: Insufficient documentation

## 2014-05-13 DIAGNOSIS — D72829 Elevated white blood cell count, unspecified: Secondary | ICD-10-CM

## 2014-05-13 DIAGNOSIS — H5500 Unspecified nystagmus: Secondary | ICD-10-CM | POA: Insufficient documentation

## 2014-05-13 DIAGNOSIS — I10 Essential (primary) hypertension: Secondary | ICD-10-CM | POA: Insufficient documentation

## 2014-05-13 DIAGNOSIS — J449 Chronic obstructive pulmonary disease, unspecified: Secondary | ICD-10-CM | POA: Insufficient documentation

## 2014-05-13 DIAGNOSIS — M436 Torticollis: Secondary | ICD-10-CM | POA: Insufficient documentation

## 2014-05-13 DIAGNOSIS — R51 Headache: Secondary | ICD-10-CM | POA: Insufficient documentation

## 2014-05-13 DIAGNOSIS — Z853 Personal history of malignant neoplasm of breast: Secondary | ICD-10-CM | POA: Insufficient documentation

## 2014-05-13 DIAGNOSIS — Z8739 Personal history of other diseases of the musculoskeletal system and connective tissue: Secondary | ICD-10-CM | POA: Insufficient documentation

## 2014-05-13 DIAGNOSIS — R112 Nausea with vomiting, unspecified: Secondary | ICD-10-CM | POA: Insufficient documentation

## 2014-05-13 DIAGNOSIS — M542 Cervicalgia: Secondary | ICD-10-CM

## 2014-05-13 DIAGNOSIS — J4489 Other specified chronic obstructive pulmonary disease: Secondary | ICD-10-CM | POA: Insufficient documentation

## 2014-05-13 DIAGNOSIS — R011 Cardiac murmur, unspecified: Secondary | ICD-10-CM | POA: Insufficient documentation

## 2014-05-13 DIAGNOSIS — F17200 Nicotine dependence, unspecified, uncomplicated: Secondary | ICD-10-CM | POA: Insufficient documentation

## 2014-05-13 LAB — CBC WITH DIFFERENTIAL/PLATELET
BASOS PCT: 0 % (ref 0–1)
Basophils Absolute: 0 10*3/uL (ref 0.0–0.1)
Eosinophils Absolute: 0.1 10*3/uL (ref 0.0–0.7)
Eosinophils Relative: 1 % (ref 0–5)
HEMATOCRIT: 44.4 % (ref 36.0–46.0)
HEMOGLOBIN: 14.7 g/dL (ref 12.0–15.0)
LYMPHS ABS: 2.3 10*3/uL (ref 0.7–4.0)
Lymphocytes Relative: 23 % (ref 12–46)
MCH: 31.5 pg (ref 26.0–34.0)
MCHC: 33.1 g/dL (ref 30.0–36.0)
MCV: 95.1 fL (ref 78.0–100.0)
MONO ABS: 1.1 10*3/uL — AB (ref 0.1–1.0)
MONOS PCT: 11 % (ref 3–12)
NEUTROS PCT: 65 % (ref 43–77)
Neutro Abs: 6.4 10*3/uL (ref 1.7–7.7)
Platelets: 193 10*3/uL (ref 150–400)
RBC: 4.67 MIL/uL (ref 3.87–5.11)
RDW: 16 % — ABNORMAL HIGH (ref 11.5–15.5)
WBC: 10 10*3/uL (ref 4.0–10.5)

## 2014-05-13 LAB — COMPREHENSIVE METABOLIC PANEL
ALBUMIN: 3.4 g/dL — AB (ref 3.5–5.2)
ALK PHOS: 76 U/L (ref 39–117)
ALT: 19 U/L (ref 0–35)
AST: 23 U/L (ref 0–37)
BUN: 16 mg/dL (ref 6–23)
CO2: 28 mEq/L (ref 19–32)
CREATININE: 0.92 mg/dL (ref 0.50–1.10)
Calcium: 10.2 mg/dL (ref 8.4–10.5)
Chloride: 97 mEq/L (ref 96–112)
GFR calc non Af Amer: 56 mL/min — ABNORMAL LOW (ref >90)
GFR, EST AFRICAN AMERICAN: 65 mL/min — AB (ref >90)
GLUCOSE: 106 mg/dL — AB (ref 70–99)
POTASSIUM: 5 meq/L (ref 3.7–5.3)
Sodium: 138 mEq/L (ref 137–147)
Total Bilirubin: 0.9 mg/dL (ref 0.3–1.2)
Total Protein: 7.7 g/dL (ref 6.0–8.3)

## 2014-05-13 LAB — SEDIMENTATION RATE: SED RATE: 31 mm/h — AB (ref 0–22)

## 2014-05-13 MED ORDER — IOHEXOL 350 MG/ML SOLN
50.0000 mL | Freq: Once | INTRAVENOUS | Status: AC | PRN
  Administered 2014-05-13: 50 mL via INTRAVENOUS

## 2014-05-13 MED ORDER — SODIUM CHLORIDE 0.9 % IV BOLUS (SEPSIS)
500.0000 mL | Freq: Once | INTRAVENOUS | Status: AC
  Administered 2014-05-13: 500 mL via INTRAVENOUS

## 2014-05-13 MED ORDER — FENTANYL CITRATE 0.05 MG/ML IJ SOLN
50.0000 ug | Freq: Once | INTRAMUSCULAR | Status: AC
  Administered 2014-05-13: 50 ug via INTRAVENOUS

## 2014-05-13 MED ORDER — FENTANYL CITRATE 0.05 MG/ML IJ SOLN
50.0000 ug | Freq: Once | INTRAMUSCULAR | Status: AC
  Administered 2014-05-13: 50 ug via INTRAVENOUS
  Filled 2014-05-13: qty 2

## 2014-05-13 NOTE — ED Notes (Signed)
CT called to inquire about pt's scan.

## 2014-05-13 NOTE — ED Notes (Signed)
Pt not tolerating soft collar, pt requesting this RN to remove.

## 2014-05-13 NOTE — ED Notes (Signed)
Mask placed on pt; charge aware. Pt denies headache or visual changes.

## 2014-05-13 NOTE — ED Notes (Signed)
Patient transported to CT 

## 2014-05-13 NOTE — ED Provider Notes (Signed)
Medical screening examination/treatment/procedure(s) were conducted as a shared visit with non-physician practitioner(s) and myself.  I personally evaluated the patient during the encounter.   EKG Interpretation None     Last known well today at noon and had sudden onset right posterior neck pain without headache but did have a spell that lasted less than 5 minutes of sudden positional vertigo without change in speech vision swallowing or understanding without focal weakness or numbness she did not try walking during the episode then afterward she was able to walk normally and has not had vertigo since then but has had constant posterior neck pain with tenderness since then without headache without new strokelike symptoms with examination showing some minimal asymptomatic right horizontal nystagmus fast component to the right only with no multidirectional nystagmus no vertical nystagmus negative test of skew normal gait no drift normal finger to nose testing no truncal ataxia normal light touch no facial asymmetry.  Babette Relic, MD 05/14/14 1314

## 2014-05-13 NOTE — ED Notes (Signed)
RN in room now starting IV.

## 2014-05-13 NOTE — ED Notes (Signed)
CT called and informed pt is available for scan, admitting MD finished examining pt.

## 2014-05-13 NOTE — ED Provider Notes (Signed)
  Face-to-face evaluation   History: She reports onset of neck pain, 2 days ago. The neck pain is improved, after taking Tylenol. She saw her primary care physician 2 days ago, and again today. She has had imaging with head CT and plain images of the cervical spine. She was seen again today and had blood work done that showed an elevated white blood cell count based on that, she was instructed to come here to be evaluated for meningitis. She has not had fever, chills, nausea, vomiting, weakness, paresthesias, or problems walking. She had transient episode of dizziness yesterday that caused her to almost fall. The dizziness resolved quickly.  Physical exam: Elderly patient alert, calm, cooperative. Nontoxic appearance. Neck mild, right lateral tenderness. She is nearly normal range of motion of the neck and can almost touch her chin to her chest. She has an exacerbation of her pain with right lateral neck bending. She has normal strength and sensation arms and legs, bilaterally, and normal sensation to both arms, and legs.  Assessment: Neck pain without evidence for meningitis, serious bacterial infection, overt spinal myelopathy or metabolic instability  Medical screening examination/treatment/procedure(s) were conducted as a shared visit with non-physician practitioner(s) and myself.  I personally evaluated the patient during the encounter  Richarda Blade, MD 05/16/14 [phone]

## 2014-05-13 NOTE — ED Notes (Signed)
IV team at BS 

## 2014-05-13 NOTE — ED Notes (Signed)
Pt placed in room and changed into gown. Pt being monitored by pulse ox, blood pressure and 5 lead.

## 2014-05-13 NOTE — ED Notes (Signed)
Pt brought back from CT.  Need other IV access.

## 2014-05-13 NOTE — ED Notes (Signed)
Pt reports since day before yesterday pt has been reporting stiff neck; is unable to touch chin to chest. Denies fever or chills. Reports nausea and coughing. Reports had episode of vertigo and right side on neck hurting. Now whole neck hurting now and Dr. Roney Jaffe [facility] high. R/o meningitis.

## 2014-05-13 NOTE — ED Notes (Signed)
IV team paged.  

## 2014-05-13 NOTE — ED Notes (Signed)
CT called and informed pt has IV access.

## 2014-05-13 NOTE — Consult Note (Signed)
Referring Physician: ED    Chief Complaint: neck pain and vertigo  HPI:                                                                                                                                         Maria Washington Washington is an 80 y.o. female with a past medical history significant for HTN, DM, left breast cancer s/p lumpectomy, second degree heart block s/p pacemaker placement, hypothyroidism, COPD, arthritis, brought in for evaluation of the above stated symptoms. She lives alone and stated that 2 days ago she was putting up laundry she noticed that she had an episode of severe dizziness in which she was afraid to open her eyes because everything was spinning and she could barely ambulate due to extreme imbalance .Had some nausea. Reported that that episode of dizziness lasted for a couple of minutes and that she slid down to the floor for comfort. Maria Washington Washington said that afterwards she developed a severe neck discomfort that has been ongoing for the past 2 days. Denies associated HA, double vision, difficulty swallowing, focal weakness or numbness, slurred speech, language or vision impairment. No recent neck/head trauma, fever, or infection. No tinnitus. At this moment she is in distress due to neck pain. CT showed no acute abnormality. CTA brain and neck pending. Date last known well: 05/11/14 Time last known well: uncertain  tPA Given: no, out of the window  Past Medical History  Diagnosis Date  . Hypertension   . Hypothyroid   . History of breast cancer   . Second degree heart block   . Heart murmur   . Cancer     left breast cA  . Arthritis   . COPD (chronic obstructive pulmonary disease)     spot on lung  . Anemia     after giving birth  . Cardiac pacemaker in situ 12/05/2011    Past Surgical History  Procedure Laterality Date  . Shoulder surgery    . Finger surgery    . Cholecystectomy    . Tonsillectomy and adenoidectomy    . Abdominal hysterectomy    . Breast biopsy       lumpectomy  . Carpel tunnel surgery      left wrist    Family History  Problem Relation Age of Onset  . Cancer Father   . Heart attack Mother   . Cancer Brother    Social History:  reports that she has been smoking Cigarettes.  She has a 60 pack-year smoking history. She has never used smokeless tobacco. She reports that she does not drink alcohol or use illicit drugs.  Allergies:  Allergies  Allergen Reactions  . Clindamycin/Lincomycin Rash  . Penicillins Rash    Medications:  I have reviewed the patient's current medications.  ROS:                                                                                                                                       History obtained from the patient and chart review.  General ROS: negative for - chills, fatigue, fever, night sweats, weight gain or weight loss Psychological ROS: negative for - behavioral disorder, hallucinations, memory difficulties, mood swings or suicidal ideation Ophthalmic ROS: negative for - blurry vision, double vision, eye pain or loss of vision ENT ROS: negative for - epistaxis, nasal discharge, oral lesions, sore throat, or tinnitus Allergy and Immunology ROS: negative for - hives or itchy/watery eyes Hematological and Lymphatic ROS: negative for - bleeding problems, bruising or swollen lymph nodes Endocrine ROS: negative for - galactorrhea, hair pattern changes, polydipsia/polyuria or temperature intolerance Respiratory ROS: negative for - cough, hemoptysis, shortness of breath or wheezing Cardiovascular ROS: negative for - chest pain, dyspnea on exertion, edema or irregular heartbeat Gastrointestinal ROS: negative for - abdominal pain, diarrhea, hematemesis, nausea/vomiting or stool incontinence Genito-Urinary ROS: negative for - dysuria, hematuria, incontinence or urinary  frequency/urgency Musculoskeletal ROS: negative for - joint swelling or muscular weakness Neurological ROS: as noted in HPI Dermatological ROS: negative for rash and skin lesion changes   Physical exam: pleasant female in mild distress due to neck pain.Blood pressure 133/49, pulse 56, temperature 97.4 F (36.3 C), temperature source Oral, resp. rate 18, SpO2 96.00%.  Head: normocephalic. Neck: tender, restricted range of motion, no bruits, no JVD. Cardiac: no murmurs. Lungs: clear. Abdomen: soft, no tender, no mass. Extremities: no edema. CV: pulses palpable throughout  Neurologic Examination:                                                                                                      Mental Status: Alert, oriented, thought content appropriate.  Speech fluent without evidence of aphasia.  Able to follow 3 step commands without difficulty. Cranial Nerves: II: Discs flat bilaterally; Visual fields grossly normal, pupils equal, round, reactive to light and accommodation III,IV, VI: ptosis not present, extra-ocular motions intact bilaterally V,VII: smile symmetric, facial light touch sensation normal bilaterally VIII: hearing normal bilaterally IX,X: gag reflex present XI: bilateral shoulder shrug XII: midline tongue extension without atrophy or fasciculations Motor: Moves all limbs symmetrically Tone and bulk:normal tone throughout; no atrophy noted Sensory: Pinprick and light touch intact throughout, bilaterally Deep Tendon Reflexes:  1+ upper extremities. 2 lower extremities  Plantars: Right: downgoing   Left: downgoing Cerebellar: normal finger-to-nose,  normal heel-to-shin test Gait: No tested     Results for orders placed during the hospital encounter of 05/13/14 (from the past 48 hour(s))  CBC WITH DIFFERENTIAL     Status: Abnormal   Collection Time    05/13/14  3:35 PM      Result Value Ref Range   WBC 10.0  4.0 - 10.5 K/uL   RBC 4.67  3.87 - 5.11 MIL/uL    Hemoglobin 14.7  12.0 - 15.0 g/dL   HCT 44.4  36.0 - 46.0 %   MCV 95.1  78.0 - 100.0 fL   MCH 31.5  26.0 - 34.0 pg   MCHC 33.1  30.0 - 36.0 g/dL   RDW 16.0 (*) 11.5 - 15.5 %   Platelets 193  150 - 400 K/uL   Neutrophils Relative % 65  43 - 77 %   Neutro Abs 6.4  1.7 - 7.7 K/uL   Lymphocytes Relative 23  12 - 46 %   Lymphs Abs 2.3  0.7 - 4.0 K/uL   Monocytes Relative 11  3 - 12 %   Monocytes Absolute 1.1 (*) 0.1 - 1.0 K/uL   Eosinophils Relative 1  0 - 5 %   Eosinophils Absolute 0.1  0.0 - 0.7 K/uL   Basophils Relative 0  0 - 1 %   Basophils Absolute 0.0  0.0 - 0.1 K/uL  COMPREHENSIVE METABOLIC PANEL     Status: Abnormal   Collection Time    05/13/14  3:35 PM      Result Value Ref Range   Sodium 138  137 - 147 mEq/L   Potassium 5.0  3.7 - 5.3 mEq/L   Comment: HEMOLYSIS AT THIS LEVEL MAY AFFECT RESULT   Chloride 97  96 - 112 mEq/L   CO2 28  19 - 32 mEq/L   Glucose, Bld 106 (*) 70 - 99 mg/dL   BUN 16  6 - 23 mg/dL   Creatinine, Ser 0.92  0.50 - 1.10 mg/dL   Calcium 10.2  8.4 - 10.5 mg/dL   Total Protein 7.7  6.0 - 8.3 g/dL   Albumin 3.4 (*) 3.5 - 5.2 g/dL   AST 23  0 - 37 U/L   Comment: HEMOLYSIS AT THIS LEVEL MAY AFFECT RESULT   ALT 19  0 - 35 U/L   Comment: HEMOLYSIS AT THIS LEVEL MAY AFFECT RESULT   Alkaline Phosphatase 76  39 - 117 U/L   Total Bilirubin 0.9  0.3 - 1.2 mg/dL   GFR calc non Af Amer 56 (*) >90 mL/min   GFR calc Af Amer 65 (*) >90 mL/min   Comment: (NOTE)     The eGFR has been calculated using the CKD EPI equation.     This calculation has not been validated in all clinical situations.     eGFR's persistently <90 mL/min signify possible Chronic Kidney     Disease.  SEDIMENTATION RATE     Status: Abnormal   Collection Time    05/13/14  3:35 PM      Result Value Ref Range   Sed Rate 31 (*) 0 - 22 mm/hr   Dg Chest 2 View  05/13/2014   CLINICAL DATA:  80 year old female with leukocytosis. Initial encounter.  EXAM: CHEST  2 VIEW  COMPARISON:  04/29/2014 and  earlier.  FINDINGS: Chronic left chest dual lead cardiac pacemaker. Stable cardiomegaly and mediastinal contours. Stable and normal lung volumes,  mild eventration of the right hemidiaphragm. No pneumothorax, pleural effusion, pulmonary edema or consolidation. No acute pulmonary opacity. Stable visualized osseous structures. Visualized tracheal air column is within normal limits.  IMPRESSION: No acute cardiopulmonary abnormality.   Electronically Signed   By: Lars Pinks M.D.   On: 05/13/2014 10:27   Dg Cervical Spine Complete  05/12/2014   CLINICAL DATA:  Neck pain, no trauma  EXAM: CERVICAL SPINE  4+ VIEWS  COMPARISON:  None.  FINDINGS: The cervical vertebrae are straightened in alignment. There is degenerative disc disease most marked at C5-6 but also present to a lesser degree at C4-5 and C6-7. No prevertebral soft tissue swelling is seen. On oblique views, there is some foraminal narrowing at C5-6 bilaterally. The odontoid process is intact. The lung apices are clear.  IMPRESSION: Straightened alignment with degenerative disc disease primarily at C5-6 where there is some foraminal narrowing noted.   Electronically Signed   By: Ivar Drape M.D.   On: 05/12/2014 14:42   Ct Head Wo Contrast  05/12/2014   CLINICAL DATA:  80 year old female with vertigo. History of breast cancer.  EXAM: CT HEAD WITHOUT CONTRAST  TECHNIQUE: Contiguous axial images were obtained from the base of the skull through the vertex without intravenous contrast.  COMPARISON:  None.  FINDINGS: Chronic small-vessel white matter ischemic changes are identified.  No acute intracranial abnormalities are identified, including mass lesion or mass effect, hydrocephalus, extra-axial fluid collection, midline shift, hemorrhage, or acute infarction.  The visualized bony calvarium is unremarkable.  IMPRESSION: No evidence of acute intracranial abnormality.  Chronic small-vessel white matter ischemic changes.   Electronically Signed   By: Hassan Rowan M.D.    On: 05/12/2014 18:25   Ct Cervical Spine Wo Contrast  05/13/2014   CLINICAL DATA:  torticollis  EXAM: CT CERVICAL SPINE WITHOUT CONTRAST  TECHNIQUE: Multidetector CT imaging of the cervical spine was performed without intravenous contrast. Multiplanar CT image reconstructions were also generated.  COMPARISON:  None.  FINDINGS: There is no fracture or spondylolisthesis. Prevertebral soft tissues and predental space regions are normal.  There is moderately severe disc space narrowing at C5-6 and C6-7. There is milder disc space narrowing at C3-4 and C4-5. There are prominent anterior osteophytes at C4, C5, and C6.  There is moderate facet hypertrophy on the right at C2-3. There is moderately severe facet osteoarthritic change/hypertrophy on the left at C3-4. At C4-5, there is moderate facet hypertrophy bilaterally. At C5-6, there is broad-based disc bulging with exit foraminal narrowing bilaterally due to bony hypertrophy. Similar changes are seen at C6-7 bilaterally. There is no disc extrusion or stenosis on this study.  There is presumed cerumen in the right external auditory canal.  IMPRESSION: Multilevel osteoarthritic change. No fracture or spondylolisthesis. At the craniocervical junction, there is no appreciable asymmetry to account for torticollis. Note that there is presumed cerumen in the right external auditory canal.   Electronically Signed   By: Lowella Grip M.D.   On: 05/13/2014 16:48     Assessment: 80 y.o. female with new onset vertigo and neck pain. Non focal neuro-exam. Concern for dissection: awaiting CTA brain and neck.  Can not have MRI due to pacemaker. Will follow up.   Dorian Pod, MD [facility] [phone]  05/13/2014, 10:41 PM  Addendum: CTA brain and neck: No proximal branch occlusion, high-grade flow-limiting stenosis, or other acute abnormality identified within the intracranial circulation. There is a  3- 4 mm left para ophthalmic saccular aneurysm  unrelated to  patient's symptoms which will need to be follow with yearly CTA to ensure stability. Her main issue is localized neck pain and will suggest discharging patient home on muscle relaxants and have follow up with neurology in 2-4 weeks.   Dorian Pod, MD

## 2014-05-13 NOTE — ED Provider Notes (Signed)
CSN: [id number]     Arrival date & time 05/13/14  1146 History   First MD Initiated Contact with Patient 05/13/14 1310     Chief Complaint  Patient presents with  . Torticollis     (Consider location/radiation/quality/duration/timing/severity/associated sxs/prior Treatment) The history is provided by the patient. No language interpreter was used.  NOELY KUHNLE is an 80 y/o F with PMHx of HTN, breast cancer, second degree heart block, DM, arthritis presenting to the ED with neck discomfort that has been ongoing for the past 2 days. As per patient reported that while she was putting up laundry she noticed that she had an episode of dizziness, almost vertigo. Reported that this lasted for a couple of minutes and that she slid down to the floor for comfort. Reported that she never had this experience again. Stated that EMS was contacted and came to assess the patient at her house - reported that everything was okay and that the patient checked out. Patient reported that she then started to feel a discomfort in her neck - reported that the discomfort is a "pain," unable to fully describe the discomfort, reported that the discomfort is worse with motion. Patient reported that she then had a headache the next, reported that the headache feels as if her head weighs "1000 pounds." Patient reported that when she walks she has been feeling off balance. Stated that she was seen and assessed by her PCP who recommended patient to get CT head and blood work - reported that there was leukocytosis and recommended patient to come to the ED to be further assessed. Denied fever, chills, chest pain, shortness of breath, difficulty breathing, numbness, tingling, fall, head injury, blurred vision, sudden loss of vision, abdominal pain, back pain, tinnitus.  PCP Dr. Felipa Eth  Past Medical History  Diagnosis Date  . Hypertension   . Hypothyroid   . History of breast cancer   . Second degree heart block   . Heart murmur    . Cancer     left breast cA  . Arthritis   . COPD (chronic obstructive pulmonary disease)     spot on lung  . Anemia     after giving birth  . Cardiac pacemaker in situ 12/05/2011   Past Surgical History  Procedure Laterality Date  . Shoulder surgery    . Finger surgery    . Cholecystectomy    . Tonsillectomy and adenoidectomy    . Abdominal hysterectomy    . Breast biopsy      lumpectomy  . Carpel tunnel surgery      left wrist   Family History  Problem Relation Age of Onset  . Cancer Father   . Heart attack Mother   . Cancer Brother    History  Substance Use Topics  . Smoking status: Current Every Day Smoker -- 1.00 packs/day for 60 years    Types: Cigarettes  . Smokeless tobacco: Never Used  . Alcohol Use: No   OB History   Grav Para Term Preterm Abortions TAB SAB Ect Mult Living                 Review of Systems  Constitutional: Negative for fever and chills.  HENT: Negative for sore throat.   Eyes: Negative for visual disturbance.  Respiratory: Negative for chest tightness and shortness of breath.   Cardiovascular: Negative for chest pain.  Gastrointestinal: Positive for nausea and vomiting. Negative for abdominal pain.  Musculoskeletal: Positive for neck pain.  Neurological: Positive for headaches. Negative for dizziness and weakness.      Allergies  Clindamycin/lincomycin and Penicillins  Home Medications   Prior to Admission medications   Medication Sig Start Date End Date Taking? Authorizing Provider  alendronate (FOSAMAX) 70 MG tablet Take 70 mg by mouth every 7 (seven) days. Monday 03/21/14  Yes Historical Provider, MD  apixaban (ELIQUIS) 5 MG TABS tablet Take 1 tablet (5 mg total) by mouth 2 (two) times daily. 05/03/14  Yes Marianne L York, PA-C  benazepril (LOTENSIN) 10 MG tablet Take 1 tablet (10 mg total) by mouth daily. 05/03/14  Yes Marianne L York, PA-C  BIOTIN 5000 PO Take 5,000 mcg by mouth daily.    Yes Historical Provider, MD   calcium-vitamin D (OSCAL WITH D) 500-200 MG-UNIT per tablet Take 1 tablet by mouth 2 (two) times daily.     Yes Historical Provider, MD  Cholecalciferol (VITAMIN D) 2000 UNITS CAPS Take 2,000 Units by mouth daily.   Yes Historical Provider, MD  diltiazem (CARDIZEM CD) 180 MG 24 hr capsule Take 180 mg by mouth daily.   Yes Historical Provider, MD  folic acid (FOLVITE) 1 MG tablet Take 1 mg by mouth daily.     Yes Historical Provider, MD  furosemide (LASIX) 40 MG tablet Take 40 mg by mouth See admin instructions. Take 40 mg by mouth on Monday, Wednesday, and Friday   Yes Historical Provider, MD  levothyroxine (SYNTHROID, LEVOTHROID) 137 MCG tablet Take 137 mcg by mouth daily before breakfast.   Yes Historical Provider, MD  mometasone (ELOCON) 0.1 % cream Apply 1 application topically daily as needed (for rash).  03/15/14  Yes Historical Provider, MD  Omega-3 Fatty Acids (FISH OIL) 1200 MG CAPS Take 1,200 mg by mouth 2 (two) times daily.    Yes Historical Provider, MD  potassium chloride SA (K-DUR,KLOR-CON) 20 MEQ tablet Take 20 mEq by mouth 2 (two) times daily.     Yes Historical Provider, MD  vitamin B-12 (CYANOCOBALAMIN) 1000 MCG tablet Take 1,000 mcg by mouth daily.     Yes Historical Provider, MD   BP 136/52  Pulse 62  Temp(Src) 97.4 F (36.3 C) (Oral)  Resp 16  SpO2 99% Physical Exam  Nursing note and vitals reviewed. Constitutional: She is oriented to person, place, and time. She appears well-developed and well-nourished. No distress.  Patient laying comfortably   HENT:  Head: Normocephalic and atraumatic.  Mouth/Throat: Oropharynx is clear and moist. No oropharyngeal exudate.  Negative trismus  Eyes: Conjunctivae and EOM are normal. Pupils are equal, round, and reactive to light. Right eye exhibits no discharge. Left eye exhibits no discharge.  Horizontal nystagmus noted  Neck: Normal range of motion. Neck supple. No tracheal deviation present.  Discomfort upon palpation to the  cervical spine - midline Negative swelling, erythema, inflammation, lesions, sores, deformities noted to the c-spine.  Decreased ROM to the c-spine secondary to discomfort  Cardiovascular: Normal rate, regular rhythm and normal heart sounds.  Exam reveals no friction rub.   No murmur heard. Pulmonary/Chest: Effort normal and breath sounds normal. No respiratory distress. She has no wheezes. She has no rales.  Musculoskeletal: Normal range of motion.  Full ROM to upper and lower extremities without difficulty noted, negative ataxia noted.  Lymphadenopathy:    She has no cervical adenopathy.  Neurological: She is alert and oriented to person, place, and time. No cranial nerve deficit. She exhibits normal muscle tone. Coordination normal. GCS eye subscore is 4. GCS verbal subscore  is 5. GCS motor subscore is 6.  Cranial nerves III-XII grossly intact Strength 5+/5+ to upper and lower extremities bilaterally with resistance applied, equal distribution noted Equal grip strength bilaterally  Patient is able to bring finger to nose bilaterally without difficulty or ataxia  Negative Brudzinski's  Negative Kernig's   Skin: Skin is warm and dry. No rash noted. She is not diaphoretic. No erythema.  Psychiatric: She has a normal mood and affect. Her behavior is normal. Thought content normal.    ED Course  Procedures (including critical care time) Labs Review Labs Reviewed  CBC WITH DIFFERENTIAL  COMPREHENSIVE METABOLIC PANEL  URINALYSIS, ROUTINE W REFLEX MICROSCOPIC  SEDIMENTATION RATE    Imaging Review Dg Chest 2 View  05/13/2014   CLINICAL DATA:  80 year old female with leukocytosis. Initial encounter.  EXAM: CHEST  2 VIEW  COMPARISON:  04/29/2014 and earlier.  FINDINGS: Chronic left chest dual lead cardiac pacemaker. Stable cardiomegaly and mediastinal contours. Stable and normal lung volumes, mild eventration of the right hemidiaphragm. No pneumothorax, pleural effusion, pulmonary edema or  consolidation. No acute pulmonary opacity. Stable visualized osseous structures. Visualized tracheal air column is within normal limits.  IMPRESSION: No acute cardiopulmonary abnormality.   Electronically Signed   By: Lars Pinks M.D.   On: 05/13/2014 10:27   Dg Cervical Spine Complete  05/12/2014   CLINICAL DATA:  Neck pain, no trauma  EXAM: CERVICAL SPINE  4+ VIEWS  COMPARISON:  None.  FINDINGS: The cervical vertebrae are straightened in alignment. There is degenerative disc disease most marked at C5-6 but also present to a lesser degree at C4-5 and C6-7. No prevertebral soft tissue swelling is seen. On oblique views, there is some foraminal narrowing at C5-6 bilaterally. The odontoid process is intact. The lung apices are clear.  IMPRESSION: Straightened alignment with degenerative disc disease primarily at C5-6 where there is some foraminal narrowing noted.   Electronically Signed   By: Ivar Drape M.D.   On: 05/12/2014 14:42   Ct Head Wo Contrast  05/12/2014   CLINICAL DATA:  80 year old female with vertigo. History of breast cancer.  EXAM: CT HEAD WITHOUT CONTRAST  TECHNIQUE: Contiguous axial images were obtained from the base of the skull through the vertex without intravenous contrast.  COMPARISON:  None.  FINDINGS: Chronic small-vessel white matter ischemic changes are identified.  No acute intracranial abnormalities are identified, including mass lesion or mass effect, hydrocephalus, extra-axial fluid collection, midline shift, hemorrhage, or acute infarction.  The visualized bony calvarium is unremarkable.  IMPRESSION: No evidence of acute intracranial abnormality.  Chronic small-vessel white matter ischemic changes.   Electronically Signed   By: Hassan Rowan M.D.   On: 05/12/2014 18:25     EKG Interpretation None      MDM   Final diagnoses:  None    Medications  sodium chloride 0.9 % bolus 500 mL (not administered)   Filed Vitals:   05/13/14 1330 05/13/14 1400 05/13/14 1430 05/13/14 1500   BP: 119/56 131/54 137/57 136/52  Pulse: 66 67 59 62  Temp:      TempSrc:      Resp: $Remo'18 14 18 16  'yTJnW$ SpO2: 96% 99% 92% 99%   Patient appears well. Negative swelling, erythema, inflammation, lesions, sores, deformities identified to the cervical spine. Mild mid spine tenderness identified to the cervical spine. Patient is almost able to bring chin to chest. Negative Brudzinski's sign. Negative Kernig's. Negative pain upon palpation to the spine. Imaging performed yesterday as an outpatient. CT  head without contrast negative for acute intracranial abnormalities, chronic small vessel white matter ischemic changes noted. Negative acute abnormalities noted. Chest x-ray negative for acute cardiac pulmonary disease. Plain film of cervical spine noted degenerative disc disease at C5-C6 where some foraminal narrowing is noted. Patient seen and assessed by attending physician, Dr. Crosby Oyster who does not believe patient to have meningitis, bacterial infection. As per attending physician, does not believe patient needs to be a LP at this time - patient declined LP. As per attending physician, recommended ESR to be performed and basic labs.  CT cervical spine ordered. Labs pending. Discussed case with Etta Quill, NP. Recommended Neuro consult. Transfer of care to Etta Quill, NP at change in shift.   Jamse Mead, PA-C 05/13/14 [address] PA-C 05/13/14 2234

## 2014-05-13 NOTE — ED Provider Notes (Signed)
Patient care assumed from [place], [place] at shift change.  Lab and radiology results reviewed.  Patient discussed with and seen by Dr. Stevie Kern.  Consulted with neurology, recommends CTA head and neck.  They will see patient.  Patient has been seen and evaluated by neurology.  CTA results reviewed and shared with patient, discussed with neurology.  Will discharge home with short term muscle relaxant and follow-up with her PCP.  Norman Herrlich, NP 05/14/14 [phone]

## 2014-05-13 NOTE — ED Notes (Signed)
Admitting MD at BS.  

## 2014-05-14 MED ORDER — DIAZEPAM 5 MG PO TABS: 5.0000 mg | ORAL_TABLET | Freq: Three times a day (TID) | ORAL | Status: DC | PRN

## 2014-05-14 MED ORDER — DIAZEPAM 5 MG PO TABS
5.0000 mg | ORAL_TABLET | Freq: Once | ORAL | Status: AC
  Administered 2014-05-14: 5 mg via ORAL
  Filled 2014-05-14: qty 1

## 2014-05-14 NOTE — ED Notes (Signed)
Neuro-hospitalist paged to consult about scans and plan of care with pt.

## 2014-05-14 NOTE — Discharge Instructions (Signed)
Torticollis, Acute You have suddenly (acutely) developed a twisted neck (torticollis). This is usually a self-limited condition. CAUSES  Acute torticollis may be caused by malposition, trauma or infection. Most commonly, acute torticollis is caused by sleeping in an awkward position. Torticollis may also be caused by the flexion, extension or twisting of the neck muscles beyond their normal position. Sometimes, the exact cause may not be known. SYMPTOMS  Usually, there is pain and limited movement of the neck. Your neck may twist to one side. DIAGNOSIS  The diagnosis is often made by physical examination. X-rays, CT scans or MRIs may be done if there is a history of trauma or concern of infection. TREATMENT  For a common, stiff neck that develops during sleep, treatment is focused on relaxing the contracted neck muscle. Medications (including shots) may be used to treat the problem. Most cases resolve in several days. Torticollis usually responds to conservative physical therapy. If left untreated, the shortened and spastic neck muscle can cause deformities in the face and neck. Rarely, surgery is required. HOME CARE INSTRUCTIONS   Use over-the-counter and prescription medications as directed by your caregiver.  Do stretching exercises and massage the neck as directed by your caregiver.  Follow up with physical therapy if needed and as directed by your caregiver. SEEK IMMEDIATE MEDICAL CARE IF:   You develop difficulty breathing or noisy breathing (stridor).  You drool, develop trouble swallowing or have pain with swallowing.  You develop numbness or weakness in the hands or feet.  You have changes in speech or vision.  You have problems with urination or bowel movements.  You have difficulty walking.  You have a fever.  You have increased pain. MAKE SURE YOU:   Understand these instructions.  Will watch your condition.  Will get help right away if you are not doing well or  get worse. Document Released: 11/22/2000 Document Revised: 02/17/2012 Document Reviewed: 01/03/2010 [facility] Patient Information 2014 [place], [place].

## 2014-08-25 ENCOUNTER — Other Ambulatory Visit: Payer: Self-pay | Admitting: Internal Medicine

## 2014-08-25 ENCOUNTER — Ambulatory Visit
Admission: RE | Admit: 2014-08-25 | Discharge: 2014-08-25 | Disposition: A | Discharge status: Home / Self Care | Payer: Medicare Other | Source: Ambulatory Visit | Attending: Internal Medicine | Admitting: Internal Medicine

## 2014-08-25 DIAGNOSIS — R059 Cough, unspecified: Secondary | ICD-10-CM

## 2014-08-25 DIAGNOSIS — R05 Cough: Secondary | ICD-10-CM

## 2014-09-08 ENCOUNTER — Other Ambulatory Visit: Payer: Self-pay

## 2014-09-08 DIAGNOSIS — Z1239 Encounter for other screening for malignant neoplasm of breast: Secondary | ICD-10-CM

## 2014-10-14 ENCOUNTER — Other Ambulatory Visit: Payer: Self-pay

## 2014-10-14 ENCOUNTER — Ambulatory Visit
Admission: RE | Admit: 2014-10-14 | Discharge: 2014-10-14 | Disposition: A | Discharge status: Home / Self Care | Payer: Medicare Other | Source: Ambulatory Visit

## 2014-10-14 DIAGNOSIS — Z1231 Encounter for screening mammogram for malignant neoplasm of breast: Secondary | ICD-10-CM

## 2014-11-16 ENCOUNTER — Encounter (HOSPITAL_COMMUNITY): Payer: Self-pay | Admitting: Internal Medicine

## 2015-04-06 ENCOUNTER — Emergency Department (HOSPITAL_COMMUNITY)
Admission: EM | Admit: 2015-04-06 | Discharge: 2015-04-06 | Disposition: A | Discharge status: Home / Self Care | Payer: Medicare Other | Attending: Emergency Medicine | Admitting: Emergency Medicine

## 2015-04-06 ENCOUNTER — Emergency Department (HOSPITAL_COMMUNITY): Payer: Medicare Other

## 2015-04-06 ENCOUNTER — Encounter (HOSPITAL_COMMUNITY): Payer: Self-pay | Admitting: *Deleted

## 2015-04-06 DIAGNOSIS — Z853 Personal history of malignant neoplasm of breast: Secondary | ICD-10-CM

## 2015-04-06 DIAGNOSIS — E039 Hypothyroidism, unspecified: Secondary | ICD-10-CM | POA: Insufficient documentation

## 2015-04-06 DIAGNOSIS — Z880 Allergy status to penicillin: Secondary | ICD-10-CM | POA: Insufficient documentation

## 2015-04-06 DIAGNOSIS — M79604 Pain in right leg: Secondary | ICD-10-CM

## 2015-04-06 DIAGNOSIS — R011 Cardiac murmur, unspecified: Secondary | ICD-10-CM | POA: Insufficient documentation

## 2015-04-06 DIAGNOSIS — Z87891 Personal history of nicotine dependence: Secondary | ICD-10-CM | POA: Insufficient documentation

## 2015-04-06 DIAGNOSIS — Z862 Personal history of diseases of the blood and blood-forming organs and certain disorders involving the immune mechanism: Secondary | ICD-10-CM | POA: Insufficient documentation

## 2015-04-06 DIAGNOSIS — R21 Rash and other nonspecific skin eruption: Secondary | ICD-10-CM | POA: Diagnosis not present

## 2015-04-06 DIAGNOSIS — Z7901 Long term (current) use of anticoagulants: Secondary | ICD-10-CM | POA: Insufficient documentation

## 2015-04-06 DIAGNOSIS — M1990 Unspecified osteoarthritis, unspecified site: Secondary | ICD-10-CM | POA: Diagnosis not present

## 2015-04-06 DIAGNOSIS — J449 Chronic obstructive pulmonary disease, unspecified: Secondary | ICD-10-CM | POA: Diagnosis not present

## 2015-04-06 DIAGNOSIS — I10 Essential (primary) hypertension: Secondary | ICD-10-CM | POA: Insufficient documentation

## 2015-04-06 DIAGNOSIS — M791 Myalgia, unspecified site: Secondary | ICD-10-CM

## 2015-04-06 DIAGNOSIS — Z950 Presence of cardiac pacemaker: Secondary | ICD-10-CM | POA: Insufficient documentation

## 2015-04-06 DIAGNOSIS — M25551 Pain in right hip: Secondary | ICD-10-CM | POA: Diagnosis not present

## 2015-04-06 DIAGNOSIS — Z79899 Other long term (current) drug therapy: Secondary | ICD-10-CM | POA: Diagnosis not present

## 2015-04-06 LAB — I-STAT CHEM 8, ED
BUN: 14 mg/dL (ref 6–23)
CHLORIDE: 105 mmol/L (ref 96–112)
Calcium, Ion: 1.2 mmol/L (ref 1.13–1.30)
Creatinine, Ser: 0.8 mg/dL (ref 0.50–1.10)
Glucose, Bld: 111 mg/dL — ABNORMAL HIGH (ref 70–99)
HCT: 40 % (ref 36.0–46.0)
Hemoglobin: 13.6 g/dL (ref 12.0–15.0)
Potassium: 3.6 mmol/L (ref 3.5–5.1)
Sodium: 142 mmol/L (ref 135–145)
TCO2: 20 mmol/L (ref 0–100)

## 2015-04-06 LAB — CBC WITH DIFFERENTIAL/PLATELET
BASOS PCT: 1 % (ref 0–1)
Basophils Absolute: 0.1 10*3/uL (ref 0.0–0.1)
EOS ABS: 0.4 10*3/uL (ref 0.0–0.7)
Eosinophils Relative: 6 % — ABNORMAL HIGH (ref 0–5)
HEMATOCRIT: 40.2 % (ref 36.0–46.0)
Hemoglobin: 13.6 g/dL (ref 12.0–15.0)
Lymphocytes Relative: 28 % (ref 12–46)
Lymphs Abs: 1.7 10*3/uL (ref 0.7–4.0)
MCH: 30.6 pg (ref 26.0–34.0)
MCHC: 33.8 g/dL (ref 30.0–36.0)
MCV: 90.5 fL (ref 78.0–100.0)
MONO ABS: 0.6 10*3/uL (ref 0.1–1.0)
Monocytes Relative: 10 % (ref 3–12)
Neutro Abs: 3.5 10*3/uL (ref 1.7–7.7)
Neutrophils Relative %: 55 % (ref 43–77)
Platelets: 205 10*3/uL (ref 150–400)
RBC: 4.44 MIL/uL (ref 3.87–5.11)
RDW: 14.2 % (ref 11.5–15.5)
WBC: 6.3 10*3/uL (ref 4.0–10.5)

## 2015-04-06 LAB — CK: CK TOTAL: 93 U/L (ref 7–177)

## 2015-04-06 MED ORDER — IBUPROFEN 400 MG PO TABS: 400.0000 mg | ORAL_TABLET | Freq: Four times a day (QID) | ORAL | Status: DC | PRN

## 2015-04-06 MED ORDER — OXYCODONE-ACETAMINOPHEN 5-325 MG PO TABS
1.0000 | ORAL_TABLET | ORAL | Status: DC | PRN
Start: 2015-04-06 — End: 2015-09-24

## 2015-04-06 MED ORDER — HYDROMORPHONE HCL 1 MG/ML IJ SOLN
1.0000 mg | Freq: Once | INTRAMUSCULAR | Status: AC
  Administered 2015-04-06: 1 mg via INTRAMUSCULAR
  Filled 2015-04-06: qty 1

## 2015-04-06 MED ORDER — OXYCODONE-ACETAMINOPHEN 5-325 MG PO TABS
1.0000 | ORAL_TABLET | Freq: Once | ORAL | Status: AC
  Administered 2015-04-06: 1 via ORAL
  Filled 2015-04-06: qty 1

## 2015-04-06 MED ORDER — IBUPROFEN 200 MG PO TABS
400.0000 mg | ORAL_TABLET | Freq: Once | ORAL | Status: AC
  Administered 2015-04-06: 400 mg via ORAL
  Filled 2015-04-06: qty 2

## 2015-04-06 NOTE — Progress Notes (Signed)
Right lower extremity venous duplex completed.  Right:  No evidence of DVT, superficial thrombosis, or Baker's cyst.  Left:  Negative for DVT in the common femoral vein.  

## 2015-04-06 NOTE — ED Provider Notes (Signed)
CSN: [id number]     Arrival date & time 04/06/15  2119 History   First MD Initiated Contact with Patient 04/06/15 0719     Chief Complaint  Patient presents with  . Leg Pain     (Consider location/radiation/quality/duration/timing/severity/associated sxs/prior Treatment) HPI Comments: Pt comes in with cc of leg pain. Pt has hx of HTN, COPD, Afib on eliquis. Pt reports that she started having R leg pain yday. The pain is described as sharp pain, in the calf region, and it radiates up. Pain is constant, even present at rest, and is worse with movement. She has no falls or injuries to the leg. No hx of similar sx. Pt has no hx of PE, DVT. No fevers, chills.  Patient is a 81 y.o. female presenting with leg pain. The history is provided by the patient.  Leg Pain   Past Medical History  Diagnosis Date  . Hypertension   . Hypothyroid   . History of breast cancer   . Second degree heart block   . Heart murmur   . Cancer     left breast cA  . Arthritis   . COPD (chronic obstructive pulmonary disease)     spot on lung  . Anemia     after giving birth  . Cardiac pacemaker in situ 12/05/2011   Past Surgical History  Procedure Laterality Date  . Shoulder surgery    . Finger surgery    . Cholecystectomy    . Tonsillectomy and adenoidectomy    . Abdominal hysterectomy    . Breast biopsy      lumpectomy  . Carpel tunnel surgery      left wrist  . Permanent pacemaker insertion N/A 12/04/2011    Procedure: PERMANENT PACEMAKER INSERTION;  Surgeon: Evans Lance, MD;  Location: [facility] CATH LAB;  Service: Cardiovascular;  Laterality: N/A;   Family History  Problem Relation Age of Onset  . Cancer Father   . Heart attack Mother   . Cancer Brother    History  Substance Use Topics  . Smoking status: Former Smoker -- 1.00 packs/day for 60 years    Types: Cigarettes    Quit date: 06/07/2013  . Smokeless tobacco: Never Used  . Alcohol Use: No   OB History    No data available      Review of Systems  Constitutional: Positive for activity change.  Respiratory: Negative for chest tightness and shortness of breath.   Cardiovascular: Negative for chest pain.  Skin: Positive for rash.      Allergies  Clindamycin/lincomycin and Penicillins  Home Medications   Prior to Admission medications   Medication Sig Start Date End Date Taking? Authorizing Provider  acetaminophen (TYLENOL) 500 MG tablet Take 500-1,000 mg by mouth every 6 (six) hours as needed for mild pain or moderate pain.   Yes Historical Provider, MD  alendronate (FOSAMAX) 70 MG tablet Take 70 mg by mouth every 7 (seven) days. Monday 03/21/14  Yes Historical Provider, MD  apixaban (ELIQUIS) 5 MG TABS tablet Take 1 tablet (5 mg total) by mouth 2 (two) times daily. 05/03/14  Yes Marianne L York, PA-C  benazepril (LOTENSIN) 10 MG tablet Take 1 tablet (10 mg total) by mouth daily. 05/03/14  Yes Marianne L York, PA-C  BIOTIN 5000 PO Take 5,000 mcg by mouth daily.    Yes Historical Provider, MD  calcium-vitamin D (OSCAL WITH D) 500-200 MG-UNIT per tablet Take 1 tablet by mouth 2 (two) times daily.  Yes Historical Provider, MD  Cholecalciferol (VITAMIN D) 2000 UNITS CAPS Take 2,000 Units by mouth daily.   Yes Historical Provider, MD  diltiazem (CARDIZEM CD) 180 MG 24 hr capsule Take 180 mg by mouth daily.   Yes Historical Provider, MD  folic acid (FOLVITE) 1 MG tablet Take 1 mg by mouth daily.     Yes Historical Provider, MD  furosemide (LASIX) 40 MG tablet Take 40 mg by mouth See admin instructions. Take 40 mg by mouth on Monday, Wednesday, and Friday   Yes Historical Provider, MD  levothyroxine (SYNTHROID, LEVOTHROID) 137 MCG tablet Take 137 mcg by mouth daily before breakfast.   Yes Historical Provider, MD  Omega-3 Fatty Acids (FISH OIL) 1200 MG CAPS Take 1,200 mg by mouth 2 (two) times daily.    Yes Historical Provider, MD  potassium chloride SA (K-DUR,KLOR-CON) 20 MEQ tablet Take 20 mEq by mouth 2 (two) times  daily.     Yes Historical Provider, MD  vitamin B-12 (CYANOCOBALAMIN) 1000 MCG tablet Take 1,000 mcg by mouth daily.     Yes Historical Provider, MD  diazepam (VALIUM) 5 MG tablet Take 1 tablet (5 mg total) by mouth every 8 (eight) hours as needed for muscle spasms. Patient not taking: Reported on 04/06/2015 05/14/14   Etta Quill, NP  ibuprofen (ADVIL,MOTRIN) 400 MG tablet Take 1 tablet (400 mg total) by mouth every 6 (six) hours as needed. 04/06/15   Varney Biles, MD  oxyCODONE-acetaminophen (PERCOCET/ROXICET) 5-325 MG per tablet Take 1 tablet by mouth every 4 (four) hours as needed for severe pain. 04/06/15   Aylen Rambert, MD   BP 160/51 mmHg  Pulse 60  Temp(Src) 97.5 F (36.4 C) (Oral)  Resp 18  SpO2 93% Physical Exam  Constitutional: She is oriented to person, place, and time. She appears well-developed.  Eyes: Conjunctivae are normal.  Cardiovascular: Normal rate.   Pulmonary/Chest: Effort normal.  Musculoskeletal:  Right sided leg swelling. There is tenderness to palpation. 2+ DP. Skin is ambient temp and there is no erythema.  Neurological: She is alert and oriented to person, place, and time.  Nursing note and vitals reviewed.   ED Course  Procedures (including critical care time) Labs Review Labs Reviewed  CBC WITH DIFFERENTIAL/PLATELET - Abnormal; Notable for the following:    Eosinophils Relative 6 (*)    All other components within normal limits  I-STAT CHEM 8, ED - Abnormal; Notable for the following:    Glucose, Bld 111 (*)    All other components within normal limits  CK    Imaging Review Dg Tibia/fibula Right  04/06/2015   CLINICAL DATA:  Generalized pain for 1 day.  No history of trauma  EXAM: RIGHT TIBIA AND FIBULA - 2 VIEW  COMPARISON:  None.  FINDINGS: Frontal and lateral views were obtained. There is no fracture or dislocation. No abnormal periosteal reaction. There are spurs arising from the posterior and inferior calcaneus. There is intrameniscal  calcification in the knee joint region.  IMPRESSION: No fracture or dislocation. No abnormal periosteal reaction. No blastic or lytic bone lesions. Intrameniscal calcification is present, a finding that may be indicative of calcium pyrophosphate deposition disease.   Electronically Signed   By: Lowella Grip III M.D.   On: 04/06/2015 10:40   Dg Hip Unilat With Pelvis 2-3 Views Right  04/06/2015   CLINICAL DATA:  Acute right hip pain without known injury. Initial encounter.  EXAM: RIGHT HIP (WITH PELVIS) 2-3 VIEWS  COMPARISON:  None.  FINDINGS:  There is no evidence of hip fracture or dislocation. There is no evidence of arthropathy or other focal bone abnormality.  IMPRESSION: Normal right hip.   Electronically Signed   By: Marijo Conception, M.D.   On: 04/06/2015 10:44     EKG Interpretation None      MDM   Final diagnoses:  Leg pain, diffuse, right  Myalgia  Hip pain, acute, right    Pt with R leg pain. There is swelling in the leg and the neurovascular exam is normal.  Initial ddx - claudication, DVT, infection, myositis.  10:15 am Pt's [place] is neg. She had repeat assessment- no signs of infection still, normal vascular exam. Pt is still in pain, and pain is still muscular in nature. Will get xrays, to ensure there is no pathologic fx.  Varney Biles, MD 04/07/15 [phone]

## 2015-04-06 NOTE — ED Notes (Signed)
Pt states that she has intermittent pain with her rt leg; pt states that usually rest will improve pain; pt states that it began to hurt yesterday afternoon and she got off her leg and rested it; pt states that the pain is worse this morning and radiates from calf through hip; pt denies injury; no obvious swelling noted.

## 2015-04-06 NOTE — Discharge Instructions (Signed)
We saw you in the ER for the leg pain. All the results in the ER are normal, labs and imaging. We are not sure what is causing your symptoms. The workup in the ER is not complete, and is limited to screening for life threatening and emergent conditions only, so please see a primary care doctor for further evaluation.  RETURN TO THE ER IF THERE IS ANY FEVERS, INCREASED PAIN, OR SWELLING.   Musculoskeletal Pain Musculoskeletal pain is muscle and boney aches and pains. These pains can occur in any part of the body. Your caregiver may treat you without knowing the cause of the pain. They may treat you if blood or urine tests, X-rays, and other tests were normal.  CAUSES There is often not a definite cause or reason for these pains. These pains may be caused by a type of germ (virus). The discomfort may also come from overuse. Overuse includes working out too hard when your body is not fit. Boney aches also come from weather changes. Bone is sensitive to atmospheric pressure changes. HOME CARE INSTRUCTIONS   Ask when your test results will be ready. Make sure you get your test results.  Only take over-the-counter or prescription medicines for pain, discomfort, or fever as directed by your caregiver. If you were given medications for your condition, do not drive, operate machinery or power tools, or sign legal documents for 24 hours. Do not drink alcohol. Do not take sleeping pills or other medications that may interfere with treatment.  Continue all activities unless the activities cause more pain. When the pain lessens, slowly resume normal activities. Gradually increase the intensity and duration of the activities or exercise.  During periods of severe pain, bed rest may be helpful. Lay or sit in any position that is comfortable.  Putting ice on the injured area.  Put ice in a bag.  Place a towel between your skin and the bag.  Leave the ice on for 15 to 20 minutes, 3 to 4 times a  day.  Follow up with your caregiver for continued problems and no reason can be found for the pain. If the pain becomes worse or does not go away, it may be necessary to repeat tests or do additional testing. Your caregiver may need to look further for a possible cause. SEEK IMMEDIATE MEDICAL CARE IF:  You have pain that is getting worse and is not relieved by medications.  You develop chest pain that is associated with shortness or breath, sweating, feeling sick to your stomach (nauseous), or throw up (vomit).  Your pain becomes localized to the abdomen.  You develop any new symptoms that seem different or that concern you. MAKE SURE YOU:   Understand these instructions.  Will watch your condition.  Will get help right away if you are not doing well or get worse. Document Released: 11/25/2005 Document Revised: 02/17/2012 Document Reviewed: 07/30/2013 [facility] Patient Information 2015 [place], [place]. This information is not intended to replace advice given to you by your health care provider. Make sure you discuss any questions you have with your health care provider. RICE: Routine Care for Injuries The routine care of many injuries includes Rest, Ice, Compression, and Elevation (RICE). HOME CARE INSTRUCTIONS  Rest is needed to allow your body to heal. Routine activities can usually be resumed when comfortable. Injured tendons and bones can take up to 6 weeks to heal. Tendons are the cord-like structures that attach muscle to bone.  Ice following an injury helps  keep the swelling down and reduces pain.  Put ice in a plastic bag.  Place a towel between your skin and the bag.  Leave the ice on for 15-20 minutes, 3-4 times a day, or as directed by your health care provider. Do this while awake, for the first 24 to 48 hours. After that, continue as directed by your caregiver.  Compression helps keep swelling down. It also gives support and helps with discomfort. If an elastic bandage  has been applied, it should be removed and reapplied every 3 to 4 hours. It should not be applied tightly, but firmly enough to keep swelling down. Watch fingers or toes for swelling, bluish discoloration, coldness, numbness, or excessive pain. If any of these problems occur, remove the bandage and reapply loosely. Contact your caregiver if these problems continue.  Elevation helps reduce swelling and decreases pain. With extremities, such as the arms, hands, legs, and feet, the injured area should be placed near or above the level of the heart, if possible. SEEK IMMEDIATE MEDICAL CARE IF:  You have persistent pain and swelling.  You develop redness, numbness, or unexpected weakness.  Your symptoms are getting worse rather than improving after several days. These symptoms may indicate that further evaluation or further X-rays are needed. Sometimes, X-rays may not show a small broken bone (fracture) until 1 week or 10 days later. Make a follow-up appointment with your caregiver. Ask when your X-ray results will be ready. Make sure you get your X-ray results. Document Released: 03/09/2001 Document Revised: 11/30/2013 Document Reviewed: 04/26/2011 [facility] Patient Information 2015 [place], [place]. This information is not intended to replace advice given to you by your health care provider. Make sure you discuss any questions you have with your health care provider.

## 2015-04-24 ENCOUNTER — Other Ambulatory Visit: Payer: Self-pay | Admitting: Physical Medicine and Rehabilitation

## 2015-04-24 DIAGNOSIS — M48061 Spinal stenosis, lumbar region without neurogenic claudication: Secondary | ICD-10-CM

## 2015-04-28 ENCOUNTER — Ambulatory Visit
Admission: RE | Admit: 2015-04-28 | Discharge: 2015-04-28 | Disposition: A | Discharge status: Home / Self Care | Payer: Medicare Other | Source: Ambulatory Visit | Attending: Physical Medicine and Rehabilitation | Admitting: Physical Medicine and Rehabilitation

## 2015-04-28 DIAGNOSIS — M48061 Spinal stenosis, lumbar region without neurogenic claudication: Secondary | ICD-10-CM

## 2015-04-28 MED ORDER — IOHEXOL 180 MG/ML  SOLN
15.0000 mL | Freq: Once | INTRAMUSCULAR | Status: AC | PRN
  Administered 2015-04-28: 15 mL via INTRATHECAL

## 2015-04-28 MED ORDER — DIAZEPAM 5 MG PO TABS
5.0000 mg | ORAL_TABLET | Freq: Once | ORAL | Status: AC
  Administered 2015-04-28: 5 mg via ORAL

## 2015-04-28 NOTE — Progress Notes (Signed)
Patient states she has been off Eliquis for the past three days.  jkl

## 2015-04-28 NOTE — Discharge Instructions (Signed)
Myelogram Discharge Instructions ° °1. Go home and rest quietly for the next 24 hours.  It is important to lie flat for the next 24 hours.  Get up only to go to the restroom.  You may lie in the bed or on a couch on your back, your stomach, your left side or your right side.  You may have one pillow under your head.  You may have pillows between your knees while you are on your side or under your knees while you are on your back. ° °2. DO NOT drive today.  Recline the seat as far back as it will go, while still wearing your seat belt, on the way home. ° °3. You may get up to go to the bathroom as needed.  You may sit up for 10 minutes to eat.  You may resume your normal diet and medications unless otherwise indicated.  Drink plenty of extra fluids today and tomorrow. ° °4. The incidence of a spinal headache with nausea and/or vomiting is about 5% (one in 20 patients).  If you develop a headache, lie flat and drink plenty of fluids until the headache goes away.  Caffeinated beverages may be helpful.  If you develop severe nausea and vomiting or a headache that does not go away with flat bed rest, call [phone]. ° °5. You may resume normal activities after your 24 hours of bed rest is over; however, do not exert yourself strongly or do any heavy lifting tomorrow. ° °6. Call your physician for a follow-up appointment.  ° ° °You may resume Eliquis today. °

## 2015-09-11 ENCOUNTER — Other Ambulatory Visit: Payer: Self-pay

## 2015-09-11 DIAGNOSIS — Z9889 Other specified postprocedural states: Secondary | ICD-10-CM

## 2015-09-11 DIAGNOSIS — Z1231 Encounter for screening mammogram for malignant neoplasm of breast: Secondary | ICD-10-CM

## 2015-09-11 DIAGNOSIS — Z853 Personal history of malignant neoplasm of breast: Secondary | ICD-10-CM

## 2015-09-24 ENCOUNTER — Encounter (HOSPITAL_COMMUNITY): Payer: Self-pay | Admitting: Emergency Medicine

## 2015-09-24 ENCOUNTER — Inpatient Hospital Stay (HOSPITAL_COMMUNITY)
Admission: EM | Admit: 2015-09-24 | Discharge: 2015-09-29 | DRG: 866 | Disposition: A | Discharge status: Home / Self Care | Payer: Medicare Other | Attending: Internal Medicine | Admitting: Internal Medicine

## 2015-09-24 ENCOUNTER — Emergency Department (HOSPITAL_COMMUNITY): Payer: Medicare Other

## 2015-09-24 ENCOUNTER — Observation Stay (HOSPITAL_COMMUNITY): Payer: Medicare Other

## 2015-09-24 DIAGNOSIS — B349 Viral infection, unspecified: Principal | ICD-10-CM | POA: Diagnosis present

## 2015-09-24 DIAGNOSIS — R509 Fever, unspecified: Secondary | ICD-10-CM | POA: Diagnosis present

## 2015-09-24 DIAGNOSIS — L03211 Cellulitis of face: Secondary | ICD-10-CM | POA: Diagnosis present

## 2015-09-24 DIAGNOSIS — Z79899 Other long term (current) drug therapy: Secondary | ICD-10-CM

## 2015-09-24 DIAGNOSIS — Z950 Presence of cardiac pacemaker: Secondary | ICD-10-CM

## 2015-09-24 DIAGNOSIS — Z7901 Long term (current) use of anticoagulants: Secondary | ICD-10-CM

## 2015-09-24 DIAGNOSIS — I480 Paroxysmal atrial fibrillation: Secondary | ICD-10-CM | POA: Diagnosis present

## 2015-09-24 DIAGNOSIS — I4891 Unspecified atrial fibrillation: Secondary | ICD-10-CM | POA: Diagnosis present

## 2015-09-24 DIAGNOSIS — M542 Cervicalgia: Secondary | ICD-10-CM | POA: Diagnosis present

## 2015-09-24 DIAGNOSIS — D696 Thrombocytopenia, unspecified: Secondary | ICD-10-CM | POA: Diagnosis present

## 2015-09-24 DIAGNOSIS — J111 Influenza due to unidentified influenza virus with other respiratory manifestations: Secondary | ICD-10-CM | POA: Diagnosis present

## 2015-09-24 DIAGNOSIS — Z853 Personal history of malignant neoplasm of breast: Secondary | ICD-10-CM

## 2015-09-24 DIAGNOSIS — Z87891 Personal history of nicotine dependence: Secondary | ICD-10-CM

## 2015-09-24 DIAGNOSIS — I110 Hypertensive heart disease with heart failure: Secondary | ICD-10-CM | POA: Diagnosis present

## 2015-09-24 DIAGNOSIS — E039 Hypothyroidism, unspecified: Secondary | ICD-10-CM | POA: Diagnosis present

## 2015-09-24 DIAGNOSIS — G8929 Other chronic pain: Secondary | ICD-10-CM | POA: Diagnosis present

## 2015-09-24 DIAGNOSIS — E119 Type 2 diabetes mellitus without complications: Secondary | ICD-10-CM | POA: Diagnosis present

## 2015-09-24 DIAGNOSIS — J449 Chronic obstructive pulmonary disease, unspecified: Secondary | ICD-10-CM | POA: Diagnosis present

## 2015-09-24 DIAGNOSIS — I119 Hypertensive heart disease without heart failure: Secondary | ICD-10-CM | POA: Diagnosis present

## 2015-09-24 DIAGNOSIS — R69 Illness, unspecified: Secondary | ICD-10-CM

## 2015-09-24 DIAGNOSIS — E876 Hypokalemia: Secondary | ICD-10-CM | POA: Diagnosis present

## 2015-09-24 DIAGNOSIS — N179 Acute kidney failure, unspecified: Secondary | ICD-10-CM | POA: Diagnosis present

## 2015-09-24 DIAGNOSIS — I5032 Chronic diastolic (congestive) heart failure: Secondary | ICD-10-CM | POA: Diagnosis present

## 2015-09-24 DIAGNOSIS — E860 Dehydration: Secondary | ICD-10-CM | POA: Diagnosis present

## 2015-09-24 DIAGNOSIS — M1990 Unspecified osteoarthritis, unspecified site: Secondary | ICD-10-CM | POA: Diagnosis present

## 2015-09-24 LAB — CBC WITH DIFFERENTIAL/PLATELET
BASOS PCT: 0 %
Basophils Absolute: 0 10*3/uL (ref 0.0–0.1)
EOS ABS: 0 10*3/uL (ref 0.0–0.7)
Eosinophils Relative: 0 %
HEMATOCRIT: 36.9 % (ref 36.0–46.0)
HEMOGLOBIN: 13 g/dL (ref 12.0–15.0)
LYMPHS PCT: 8 %
Lymphs Abs: 0.6 10*3/uL — ABNORMAL LOW (ref 0.7–4.0)
MCH: 30.4 pg (ref 26.0–34.0)
MCHC: 35.2 g/dL (ref 30.0–36.0)
MCV: 86.2 fL (ref 78.0–100.0)
MONOS PCT: 7 %
Monocytes Absolute: 0.5 10*3/uL (ref 0.1–1.0)
NEUTROS ABS: 5.9 10*3/uL (ref 1.7–7.7)
NEUTROS PCT: 85 %
Platelets: 110 10*3/uL — ABNORMAL LOW (ref 150–400)
RBC: 4.28 MIL/uL (ref 3.87–5.11)
RDW: 14.1 % (ref 11.5–15.5)
WBC: 7 10*3/uL (ref 4.0–10.5)

## 2015-09-24 LAB — COMPREHENSIVE METABOLIC PANEL
ALK PHOS: 71 U/L (ref 38–126)
ALT: 32 U/L (ref 14–54)
AST: 34 U/L (ref 15–41)
Albumin: 3.3 g/dL — ABNORMAL LOW (ref 3.5–5.0)
Anion gap: 10 (ref 5–15)
BILIRUBIN TOTAL: 0.8 mg/dL (ref 0.3–1.2)
BUN: 18 mg/dL (ref 6–20)
CALCIUM: 8.7 mg/dL — AB (ref 8.9–10.3)
CO2: 20 mmol/L — AB (ref 22–32)
CREATININE: 1.01 mg/dL — AB (ref 0.44–1.00)
Chloride: 104 mmol/L (ref 101–111)
GFR calc non Af Amer: 50 mL/min — ABNORMAL LOW (ref >60)
GFR, EST AFRICAN AMERICAN: 58 mL/min — AB (ref >60)
GLUCOSE: 201 mg/dL — AB (ref 65–99)
Potassium: 3.1 mmol/L — ABNORMAL LOW (ref 3.5–5.1)
SODIUM: 134 mmol/L — AB (ref 135–145)
TOTAL PROTEIN: 6.8 g/dL (ref 6.5–8.1)

## 2015-09-24 LAB — CSF CELL COUNT WITH DIFFERENTIAL
RBC COUNT CSF: 0 /mm3
RBC COUNT CSF: 283 /mm3 — AB
TUBE #: 1
Tube #: 4
WBC CSF: 1 /mm3 (ref 0–5)
WBC, CSF: 1 /mm3 (ref 0–5)

## 2015-09-24 LAB — URINALYSIS, ROUTINE W REFLEX MICROSCOPIC
Bilirubin Urine: NEGATIVE
GLUCOSE, UA: NEGATIVE mg/dL
Ketones, ur: NEGATIVE mg/dL
LEUKOCYTES UA: NEGATIVE
NITRITE: NEGATIVE
PH: 5.5 (ref 5.0–8.0)
Protein, ur: NEGATIVE mg/dL
SPECIFIC GRAVITY, URINE: 1.012 (ref 1.005–1.030)
Urobilinogen, UA: 0.2 mg/dL (ref 0.0–1.0)

## 2015-09-24 LAB — I-STAT TROPONIN, ED: TROPONIN I, POC: 0.03 ng/mL (ref 0.00–0.08)

## 2015-09-24 LAB — I-STAT CG4 LACTIC ACID, ED: Lactic Acid, Venous: 1.88 mmol/L (ref 0.5–2.0)

## 2015-09-24 LAB — GLUCOSE, CSF: Glucose, CSF: 64 mg/dL (ref 40–70)

## 2015-09-24 LAB — URINE MICROSCOPIC-ADD ON

## 2015-09-24 LAB — PROTEIN, CSF: Total  Protein, CSF: 72 mg/dL — ABNORMAL HIGH (ref 15–45)

## 2015-09-24 MED ORDER — ENSURE ENLIVE PO LIQD
237.0000 mL | Freq: Two times a day (BID) | ORAL | Status: DC
  Administered 2015-09-25 – 2015-09-29 (×8): 237 mL via ORAL

## 2015-09-24 MED ORDER — DEXTROSE 5 % IV SOLN
2.0000 g | Freq: Two times a day (BID) | INTRAVENOUS | Status: DC
  Administered 2015-09-24 – 2015-09-26 (×4): 2 g via INTRAVENOUS
  Filled 2015-09-24 (×4): qty 2

## 2015-09-24 MED ORDER — MORPHINE SULFATE (PF) 4 MG/ML IV SOLN
4.0000 mg | Freq: Once | INTRAVENOUS | Status: AC
  Administered 2015-09-24: 4 mg via INTRAVENOUS
  Filled 2015-09-24: qty 1

## 2015-09-24 MED ORDER — ONDANSETRON HCL 4 MG/2ML IJ SOLN
4.0000 mg | Freq: Four times a day (QID) | INTRAMUSCULAR | Status: DC | PRN
  Administered 2015-09-24 – 2015-09-25 (×2): 4 mg via INTRAVENOUS
  Filled 2015-09-24 (×2): qty 2

## 2015-09-24 MED ORDER — ACETAMINOPHEN 500 MG PO TABS
500.0000 mg | ORAL_TABLET | Freq: Four times a day (QID) | ORAL | Status: DC | PRN
  Administered 2015-09-25: 1000 mg via ORAL
  Filled 2015-09-24: qty 2

## 2015-09-24 MED ORDER — CEFTAZIDIME 1 G IJ SOLR
1.0000 g | Freq: Two times a day (BID) | INTRAMUSCULAR | Status: DC
  Filled 2015-09-24: qty 1

## 2015-09-24 MED ORDER — SODIUM CHLORIDE 0.9 % IV BOLUS (SEPSIS)
1000.0000 mL | Freq: Once | INTRAVENOUS | Status: AC
  Administered 2015-09-24: 1000 mL via INTRAVENOUS

## 2015-09-24 MED ORDER — TRAMADOL HCL 50 MG PO TABS
50.0000 mg | ORAL_TABLET | Freq: Four times a day (QID) | ORAL | Status: DC | PRN
  Administered 2015-09-24 – 2015-09-25 (×3): 50 mg via ORAL
  Filled 2015-09-24 (×3): qty 1

## 2015-09-24 MED ORDER — APIXABAN 5 MG PO TABS
5.0000 mg | ORAL_TABLET | Freq: Two times a day (BID) | ORAL | Status: DC
  Administered 2015-09-24 – 2015-09-29 (×10): 5 mg via ORAL
  Filled 2015-09-24 (×12): qty 1

## 2015-09-24 MED ORDER — POTASSIUM CHLORIDE CRYS ER 20 MEQ PO TBCR
20.0000 meq | EXTENDED_RELEASE_TABLET | Freq: Two times a day (BID) | ORAL | Status: DC
  Administered 2015-09-24 – 2015-09-26 (×4): 20 meq via ORAL
  Filled 2015-09-24 (×5): qty 1

## 2015-09-24 MED ORDER — DEXTROSE 5 % IV SOLN
480.0000 mg | Freq: Two times a day (BID) | INTRAVENOUS | Status: DC
  Administered 2015-09-24 – 2015-09-26 (×4): 480 mg via INTRAVENOUS
  Filled 2015-09-24 (×4): qty 9.6

## 2015-09-24 MED ORDER — DILTIAZEM HCL ER COATED BEADS 180 MG PO CP24
180.0000 mg | ORAL_CAPSULE | Freq: Every day | ORAL | Status: DC
  Administered 2015-09-25 – 2015-09-29 (×5): 180 mg via ORAL
  Filled 2015-09-24 (×5): qty 1

## 2015-09-24 MED ORDER — VANCOMYCIN HCL IN DEXTROSE 1-5 GM/200ML-% IV SOLN
1000.0000 mg | INTRAVENOUS | Status: DC
  Administered 2015-09-24 – 2015-09-25 (×2): 1000 mg via INTRAVENOUS
  Filled 2015-09-24 (×2): qty 200

## 2015-09-24 MED ORDER — LEVOTHYROXINE SODIUM 137 MCG PO TABS
137.0000 ug | ORAL_TABLET | Freq: Every day | ORAL | Status: DC
  Administered 2015-09-25 – 2015-09-29 (×5): 137 ug via ORAL
  Filled 2015-09-24 (×6): qty 1

## 2015-09-24 MED ORDER — SODIUM CHLORIDE 0.9 % IV SOLN
INTRAVENOUS | Status: DC
  Administered 2015-09-24 – 2015-09-27 (×4): via INTRAVENOUS

## 2015-09-24 MED ORDER — ONDANSETRON HCL 4 MG PO TABS: 4.0000 mg | ORAL_TABLET | Freq: Four times a day (QID) | ORAL | Status: DC | PRN

## 2015-09-24 MED ORDER — LIDOCAINE HCL 1 % IJ SOLN: 30.0000 mL | Freq: Once | INTRAMUSCULAR | Status: DC

## 2015-09-24 NOTE — ED Notes (Signed)
MD at bedside to do lumbar puncture

## 2015-09-24 NOTE — ED Notes (Addendum)
Pt reports she has been having generalized body weakness and body pain especially in her neck. Was checked for flu on Thursday which was negative. Pt recently treated for cellulitits on her face, is finishing abx now. Occasional cough. Denies urinary symptoms.

## 2015-09-24 NOTE — Progress Notes (Addendum)
[name] for Vancomycin & Maria Washington & acyclovir Indication: rule out sepsis, r/o meningitis  Allergies  Allergen Reactions  . Clindamycin/Lincomycin Rash  . Penicillins Rash    Has patient had a PCN reaction causing immediate rash, facial/tongue/throat swelling, SOB or lightheadedness with hypotension: no Has patient had a PCN reaction causing severe rash involving mucus membranes or skin necrosis: no Has patient had a PCN reaction that required hospitalization no Has patient had a PCN reaction occurring within the last 10 years: no If all of the above answers are "NO", then may proceed with Cephalosporin use.     Patient Measurements: Height: '5\' 1"'$  (154.9 cm) Weight: 155 lb (70.308 kg) IBW/kg (Calculated) : 47.8  Vital Signs: Temp: 99.6 [place] (37.6 C) (10/16 1326) Temp Source: Oral (10/16 1326) BP: 126/62 mmHg (10/16 1644) Pulse Rate: 75 (10/16 1644) Intake/Output from previous day:   Intake/Output from this shift:    Labs:  Recent Labs  09/24/15 1350  WBC 7.0  HGB 13.0  PLT 110*  CREATININE 1.01*   Estimated Creatinine Clearance: 37.2 mL/min (by C-G formula based on Cr of 1.01). No results for input(s): VANCOTROUGH, VANCOPEAK, VANCORANDOM, GENTTROUGH, GENTPEAK, GENTRANDOM, TOBRATROUGH, TOBRAPEAK, TOBRARND, AMIKACINPEAK, AMIKACINTROU, AMIKACIN in the last 72 hours.   Microbiology: No results found for this or any previous visit (from the past 720 hour(s)).  Anti-infectives    Start     Dose/Rate Route Frequency Ordered Stop   09/24/15 1800  vancomycin (VANCOCIN) IVPB 1000 mg/200 mL premix     1,000 mg 200 mL/hr over 60 Minutes Intravenous Every 24 hours 09/24/15 1657     09/24/15 1700  cefTAZidime (FORTAZ) 1 g in dextrose 5 % 50 mL IVPB     1 g 100 mL/hr over 30 Minutes Intravenous Every 12 hours 09/24/15 1657        Assessment: Maria Washington who presents with generalized weakness and pain.  She was diagnosed with viral illness on  Thursday and ~2 weeks ago was given course of Bactrim for facial cellulitis.   She reports fever of 101.4 [place] at home, however afebrile since admission.  WBC and lactic acid are normal.   Scr is elevated slightly above baseline.  PCN and Clindamycin allergy noted. MD contacted and Zosyn changed to [place].  10/16 >>Vanc >> 10/16 >> Maria [place] >>   10/16>>Acyclovir>>  10/16 blood x2: 10/16 urine:  10/16 CSF fluid:  10/16: Influenza PCR:   Dose changes/levels:   Goal of Therapy:  Vancomycin trough level 15-20 mcg/ml  Plan:  Acyclovir '480mg'$  ('10mg'$ /kg IBW) q12h Vancomycin 1gm IV q24h Increase Fortaz 2gm IV q12h Check Vancomycin trough at steady state Monitor renal function and cx data   Maria [place] 09/24/2015,4:57 PM

## 2015-09-24 NOTE — Progress Notes (Signed)
Pt. Arrived to floor via stretcher from Emergency department. Family at bedside. LP completed in ED band aide to lower back intact. No respiratory distress noted.

## 2015-09-24 NOTE — ED Notes (Signed)
I ATTEMPTED TO COLLECT CULTURES HOWEVER I WAS UNSUCCESSFUL.

## 2015-09-24 NOTE — ED Provider Notes (Addendum)
CSN: [id number]     Arrival date & time 09/24/15  1316 History   First MD Initiated Contact with Patient 09/24/15 1324     Chief Complaint  Patient presents with  . Weakness  . Generalized Body Aches  . Neck Pain     (Consider location/radiation/quality/duration/timing/severity/associated sxs/prior Treatment) HPI   Patient is a 81 year old female presenting with not feeling well at home. Patient had 2 weeks ago a history of facial cellulitis diagnosed by dermatology and given Bactrim. Patient reports not feeling well since Tuesday, 6 days ago. Patient went to go see her primary care on Thursday and was diagnosed with viral syndrome. No additional testing was done at that time. Patient states that she's been having some pain in her neck that has been increasing.  Patient is here with her duaghters. They report fever at hom- up to 101.4.   Denies any cough, nausea vomiting or diarrhea or urinary symptoms.  Past Medical History  Diagnosis Date  . Hypertension   . Hypothyroid   . History of breast cancer   . Second degree heart block   . Heart murmur   . Cancer ([place])     left breast cA  . Arthritis   . COPD (chronic obstructive pulmonary disease) (HCC)     spot on lung  . Anemia     after giving birth  . Cardiac pacemaker in situ 12/05/2011   Past Surgical History  Procedure Laterality Date  . Shoulder surgery    . Finger surgery    . Cholecystectomy    . Tonsillectomy and adenoidectomy    . Abdominal hysterectomy    . Breast biopsy      lumpectomy  . Carpel tunnel surgery      left wrist  . Permanent pacemaker insertion N/A 12/04/2011    Procedure: PERMANENT PACEMAKER INSERTION;  Surgeon: Evans Lance, MD;  Location: [facility] CATH LAB;  Service: Cardiovascular;  Laterality: N/A;   Family History  Problem Relation Age of Onset  . Cancer Father   . Heart attack Mother   . Cancer Brother    Social History  Substance Use Topics  . Smoking status: Former Smoker -- 1.00  packs/day for 60 years    Types: Cigarettes    Quit date: 06/07/2013  . Smokeless tobacco: Never Used  . Alcohol Use: No   OB History    No data available     Review of Systems  Constitutional: Negative for activity change and fatigue.  HENT: Negative for congestion, ear pain and facial swelling.   Eyes: Negative for discharge.  Respiratory: Negative for cough, chest tightness and shortness of breath.   Cardiovascular: Negative for chest pain.  Gastrointestinal: Negative for abdominal pain and abdominal distention.  Genitourinary: Negative for dysuria and difficulty urinating.  Musculoskeletal: Positive for neck pain. Negative for joint swelling.  Skin: Negative for rash.  Allergic/Immunologic: Negative for immunocompromised state.  Neurological: Positive for dizziness. Negative for seizures and speech difficulty.  Psychiatric/Behavioral: Negative for agitation.      Allergies  Clindamycin/lincomycin and Penicillins  Home Medications   Prior to Admission medications   Medication Sig Start Date End Date Taking? Authorizing Provider  acetaminophen (TYLENOL) 500 MG tablet Take 500-1,000 mg by mouth every 6 (six) hours as needed for mild pain or moderate pain.    Historical Provider, MD  alendronate (FOSAMAX) 70 MG tablet Take 70 mg by mouth every 7 (seven) days. Monday 03/21/14   Historical Provider, MD  apixaban Arne Cleveland)  5 MG TABS tablet Take 1 tablet (5 mg total) by mouth 2 (two) times daily. 05/03/14   Bobby Rumpf York, PA-C  benazepril (LOTENSIN) 10 MG tablet Take 1 tablet (10 mg total) by mouth daily. 05/03/14   Bobby Rumpf York, PA-C  BIOTIN 5000 PO Take 5,000 mcg by mouth daily.     Historical Provider, MD  calcium-vitamin D (OSCAL WITH D) 500-200 MG-UNIT per tablet Take 1 tablet by mouth 2 (two) times daily.      Historical Provider, MD  Cholecalciferol (VITAMIN D) 2000 UNITS CAPS Take 2,000 Units by mouth daily.    Historical Provider, MD  diazepam (VALIUM) 5 MG tablet Take  1 tablet (5 mg total) by mouth every 8 (eight) hours as needed for muscle spasms. Patient not taking: Reported on 04/06/2015 05/14/14   Etta Quill, NP  diltiazem (CARDIZEM CD) 180 MG 24 hr capsule Take 180 mg by mouth daily.    Historical Provider, MD  folic acid (FOLVITE) 1 MG tablet Take 1 mg by mouth daily.      Historical Provider, MD  furosemide (LASIX) 40 MG tablet Take 40 mg by mouth See admin instructions. Take 40 mg by mouth on Monday, Wednesday, and Friday    Historical Provider, MD  ibuprofen (ADVIL,MOTRIN) 400 MG tablet Take 1 tablet (400 mg total) by mouth every 6 (six) hours as needed. 04/06/15   Varney Biles, MD  levothyroxine (SYNTHROID, LEVOTHROID) 137 MCG tablet Take 137 mcg by mouth daily before breakfast.    Historical Provider, MD  Omega-3 Fatty Acids (FISH OIL) 1200 MG CAPS Take 1,200 mg by mouth 2 (two) times daily.     Historical Provider, MD  oxyCODONE-acetaminophen (PERCOCET/ROXICET) 5-325 MG per tablet Take 1 tablet by mouth every 4 (four) hours as needed for severe pain. 04/06/15   Varney Biles, MD  potassium chloride SA (K-DUR,KLOR-CON) 20 MEQ tablet Take 20 mEq by mouth 2 (two) times daily.      Historical Provider, MD  vitamin B-12 (CYANOCOBALAMIN) 1000 MCG tablet Take 1,000 mcg by mouth daily.      Historical Provider, MD   BP 127/66 mmHg  Pulse 85  Temp(Src) 99.6 F (37.6 C) (Oral)  Resp 25  SpO2 96% Physical Exam  Constitutional: She is oriented to person, place, and time. She appears well-developed and well-nourished.  HENT:  Head: Normocephalic and atraumatic.  No evidence of infection on her face.  Eyes: Conjunctivae are normal. Right eye exhibits no discharge.  Neck: Neck supple.  Cardiovascular: Normal rate, regular rhythm and normal heart sounds.   No murmur heard. Pulmonary/Chest: Effort normal and breath sounds normal. She has no wheezes. She has no rales.  Abdominal: Soft. She exhibits no distension. There is no tenderness.  Musculoskeletal:  Normal range of motion. She exhibits no edema.  Neurological: She is oriented to person, place, and time. No cranial nerve deficit.  Negative Brudzinski's negative Kernig's  Skin: Skin is warm and dry. No rash noted. She is not diaphoretic.  Psychiatric: She has a normal mood and affect. Her behavior is normal.  Nursing note and vitals reviewed.   ED Course  .Lumbar Puncture Date/Time: 09/24/2015 4:43 PM Performed by: Zenovia Jarred LYN Authorized by: Zenovia Jarred LYN Consent: Verbal consent obtained. Risks and benefits: risks, benefits and alternatives were discussed Consent given by: patient (all her daughters) Patient understanding: patient states understanding of the procedure being performed Site marked: the operative site was not marked Patient identity confirmed: verbally with patient and hospital-assigned identification  number Time out: Immediately prior to procedure a "time out" was called to verify the correct patient, procedure, equipment, support staff and site/side marked as required. Indications: evaluation for infection Anesthesia: local infiltration Local anesthetic: lidocaine 1% with epinephrine Patient sedated: no Preparation: Patient was prepped and draped in the usual sterile fashion. Lumbar space: L3-L4 interspace Patient's position: sitting Needle gauge: 22 Needle type: spinal needle - Quincke tip Needle length: 3.5 in Number of attempts: 2 Fluid appearance: blood-tinged Tubes of fluid: 4 Total volume: 4 ml Post-procedure: site cleaned and pressure dressing applied Patient tolerance: Patient tolerated the procedure well with no immediate complications Comments: Procedure extensively discussed with all of patient's daughters and patietn herself.    (including critical care time) Labs Review Labs Reviewed - No data to display  Imaging Review No results found. I have personally reviewed and evaluated these images and lab results as part of my  medical decision-making.   EKG Interpretation None      MDM   Final diagnoses:  None    Patient is a 81 year old female presenting today with fever and feelings of not feeling well. In addition patient has neck pain. Patient's had neck pain on multiple his visits prior to this. She was diagnosed torticollis, had a lumbar puncture, got a CT CTA in the past. Patient reports bilateral "mneck pain".  She points to the sternocleidomastroid and there is soreness to touch.. Brudzinski and Kernig negative.  We will search for other signs of infection first comment to do UA, chest x-ray. We will look for increased white count presents infection.  Given her recent facial cellulitis I  mild concern for meningitis, However given her fever and her continued neck pain I will offer the family a lumbar puncture.    Family and patient would like a lumbar puncture.   4:45 PM  Lumbar puncture completed. I still think is most likely the patient has a fluid given her myalgias, neck pain, fever. However we will send the fluid to rule out meningitis in this 81 year old female. Patient will prior admission given her fever. We sent blood cultures. Now that we've done a lumbar puncture we will treat empirically with antibiotics.  Courteney Julio Alm, MD 09/24/15 [place], MD 09/24/15 1646

## 2015-09-24 NOTE — H&P (Addendum)
[facility] History and Physical  [name]:[id number] DOB: [birth year] DOA: 09/24/2015  Referring physician: EDP PCP: Mathews Argyle, MD   Chief Complaint: fever and chills with myalgias since last Tuesday.   HPI: Maria Washington is a 81 y.o. female hypertension, hypothyroidism, COPD, atrial fibrillation, chronic diastolic heart failure,   Left breast cancer  S/p lumpectomy, second degree heart block with PPM placement, diabetes mellitus, h/o vertigo and chronic neck pain, presents to ED with the above symptoms after being treated for facial cellulitis two weeks ago, . She reports some neck pain, which has been chronic over several months. She denies any other complaints. Her work up in ED shows a negative CXR and UA and a LP was done by EDP for evaluation of meningitis for her history of neck and fever. CSF analysis sent and blood cultures were drawn. Influenza PCR ordered. She was referred to medical service for evaluation of fever of unclear etiology.  Of note she had a CTA of the head and neck last year , which did not show any high grade stenosis or any branch occlusion.    Review of Systems:  Constitutional:  Fever , chills.  HEENT:  She reports headache, .  No sneezing, itching, ear ache, nasal congestion, post nasal drip,  Cardio-vascular:  No chest pain, Orthopnea, PND, swelling in lower extremities, anasarca, dizziness, palpitations  GI:  No heartburn, indigestion, abdominal pain, nausea, vomiting, diarrhea, change in bowel habits, loss of appetite  Resp:  No shortness of breath with exertion or at rest. No excess mucus, no productive cough, No non-productive cough, No coughing up of blood.No change in color of mucus.No wheezing.No chest wall deformity  Skin:  no rash or lesions.  GU:  no dysuria, change in color of urine, no urgency or frequency. No flank pain.  Musculoskeletal:  No joint pain or swelling. No decreased range of motion. No back pain.    Psych:  No change in mood or affect. No depression or anxiety. No memory loss.   Past Medical History  Diagnosis Date  . Hypertension   . Hypothyroid   . History of breast cancer   . Second degree heart block   . Heart murmur   . Cancer ([place])     left breast cA  . Arthritis   . COPD (chronic obstructive pulmonary disease) (HCC)     spot on lung  . Anemia     after giving birth  . Cardiac pacemaker in situ 12/05/2011   Past Surgical History  Procedure Laterality Date  . Shoulder surgery    . Finger surgery    . Cholecystectomy    . Tonsillectomy and adenoidectomy    . Abdominal hysterectomy    . Breast biopsy      lumpectomy  . Carpel tunnel surgery      left wrist  . Permanent pacemaker insertion N/A 12/04/2011    Procedure: PERMANENT PACEMAKER INSERTION;  Surgeon: Evans Lance, MD;  Location: [facility] CATH LAB;  Service: Cardiovascular;  Laterality: N/A;   Social History:  reports that she quit smoking about 2 years ago. Her smoking use included Cigarettes. She has a 60 pack-year smoking history. She has never used smokeless tobacco. She reports that she does not drink alcohol or use illicit drugs.  Allergies  Allergen Reactions  . Clindamycin/Lincomycin Rash  . Penicillins Rash    Has patient had a PCN reaction causing immediate rash, facial/tongue/throat swelling, SOB or lightheadedness with hypotension: no Has patient  had a PCN reaction causing severe rash involving mucus membranes or skin necrosis: no Has patient had a PCN reaction that required hospitalization no Has patient had a PCN reaction occurring within the last 10 years: no If all of the above answers are "NO", then may proceed with Cephalosporin use.     Family History  Problem Relation Age of Onset  . Cancer Father   . Heart attack Mother   . Cancer Brother     Prior to Admission medications   Medication Sig Start Date End Date Taking? Authorizing Provider  acetaminophen (TYLENOL) 500 MG tablet  Take 500-1,000 mg by mouth every 6 (six) hours as needed for mild pain or moderate pain.   Yes Historical Provider, MD  alendronate (FOSAMAX) 70 MG tablet Take 70 mg by mouth every 7 (seven) days. Monday 03/21/14  Yes Historical Provider, MD  apixaban (ELIQUIS) 5 MG TABS tablet Take 1 tablet (5 mg total) by mouth 2 (two) times daily. 05/03/14  Yes Marianne L York, PA-C  BIOTIN 5000 PO Take 5,000 mcg by mouth daily.    Yes Historical Provider, MD  calcium-vitamin D (OSCAL WITH D) 500-200 MG-UNIT per tablet Take 1 tablet by mouth 2 (two) times daily.     Yes Historical Provider, MD  Cholecalciferol (VITAMIN D PO) Take 1 capsule by mouth daily.   Yes Historical Provider, MD  diltiazem (CARDIZEM CD) 180 MG 24 hr capsule Take 180 mg by mouth daily.   Yes Historical Provider, MD  folic acid (FOLVITE) 1 MG tablet Take 1 mg by mouth daily.     Yes Historical Provider, MD  furosemide (LASIX) 40 MG tablet Take 40 mg by mouth daily.    Yes Historical Provider, MD  ibuprofen (ADVIL,MOTRIN) 400 MG tablet Take 1 tablet (400 mg total) by mouth every 6 (six) hours as needed. 04/06/15  Yes Varney Biles, MD  levothyroxine (SYNTHROID, LEVOTHROID) 137 MCG tablet Take 137 mcg by mouth daily before breakfast.   Yes Historical Provider, MD  Omega-3 Fatty Acids (FISH OIL) 1200 MG CAPS Take 1,200 mg by mouth 2 (two) times daily.    Yes Historical Provider, MD  potassium chloride SA (K-DUR,KLOR-CON) 20 MEQ tablet Take 20 mEq by mouth 2 (two) times daily.     Yes Historical Provider, MD  sulfamethoxazole-trimethoprim (BACTRIM DS,SEPTRA DS) 800-160 MG tablet Take 1 tablet by mouth 2 (two) times daily. for 10 days 10-11 to 10-21 09/18/15  Yes Historical Provider, MD  vitamin B-12 (CYANOCOBALAMIN) 1000 MCG tablet Take 1,000 mcg by mouth daily.      Historical Provider, MD   Physical Exam: Filed Vitals:   09/24/15 1326 09/24/15 1615 09/24/15 1642 09/24/15 1644  BP: 127/66   126/62  Pulse: 85   75  Temp: 99.6 F (37.6 C)       TempSrc: Oral     Resp: '25 23  25  '$ Height:   '5\' 1"'$  (1.549 m)   Weight:   70.308 kg (155 lb)   SpO2: 96%   99%    Wt Readings from Last 3 Encounters:  09/24/15 70.308 kg (155 lb)  05/03/14 79.5 kg (175 lb 4.3 oz)  02/06/12 72.258 kg (159 lb 4.8 oz)    General:  Appears calm and comfortable Eyes: PERRL, normal lids, irises & conjunctiva Neck: no LAD, masses or thyromegaly Cardiovascular: RRR, no m/r/g. No LE edema. Respiratory: CTA bilaterally, no w/r/r. Normal respiratory effort. Abdomen: soft, ntnd Skin: no rash or induration seen on limited exam Musculoskeletal:  grossly normal tone BUE/BLE Neurologic: grossly non-focal.          Labs on Admission:  Basic Metabolic Panel:  Recent Labs Lab 09/24/15 1350  NA 134*  K 3.1*  CL 104  CO2 20*  GLUCOSE 201*  BUN 18  CREATININE 1.01*  CALCIUM 8.7*   Liver Function Tests:  Recent Labs Lab 09/24/15 1350  AST 34  ALT 32  ALKPHOS 71  BILITOT 0.8  PROT 6.8  ALBUMIN 3.3*   No results for input(s): LIPASE, AMYLASE in the last 168 hours. No results for input(s): AMMONIA in the last 168 hours. CBC:  Recent Labs Lab 09/24/15 1350  WBC 7.0  NEUTROABS 5.9  HGB 13.0  HCT 36.9  MCV 86.2  PLT 110*   Cardiac Enzymes: No results for input(s): CKTOTAL, CKMB, CKMBINDEX, TROPONINI in the last 168 hours.  BNP (last 3 results) No results for input(s): BNP in the last 8760 hours.  ProBNP (last 3 results) No results for input(s): PROBNP in the last 8760 hours.  CBG: No results for input(s): GLUCAP in the last 168 hours.  Radiological Exams on Admission: Dg Chest 2 View  09/24/2015  CLINICAL DATA:  Productive cough and nausea for 3 days. Chills. Anorexia. EXAM: CHEST  2 VIEW COMPARISON:  08/25/2014 FINDINGS: The heart size and mediastinal contours are within normal limits. Dual lead transvenous pacemaker remains in appropriate position. Both lungs are clear. No evidence of pneumothorax or pleural effusion. Surgical  clips again seen in the left axilla. IMPRESSION: No active cardiopulmonary disease. Electronically Signed   By: Earle Gell M.D.   On: 09/24/2015 14:50    EKG: Independently reviewed. Paced.   Assessment/Plan Active Problems:   Fever   Fever and chills with myalgia's; Probably a viral syndrome, because she has a h/o neck pain and fever, she underwent LP in ED and CSF analysis is pending.  Influenza PCR ordered.   Hypokalemia; Replete as needed and repeat in am.   Copd: no wheezing heard,.   Atrial fibrillation;  rate controlled. Resume eliquis and cardizem.   Chronic diastolic heart failure; She appears to be dehydrated, on IV fluids.  Monitor.  Holding lasix   Dehydration. gnetle hydration   Hypertension; Well controlled.   Diabetes mellitus; Hgba1c, and SSI ordered.     Mild thrombocytopenia; Monitor trend.  On eliquis.     Code Status: full code.  DVT Prophylaxis: Family Communication: family at bedside Disposition Plan: pending PT eval.   Time spent: 60 min  [facility] Pager [phone]

## 2015-09-25 DIAGNOSIS — N179 Acute kidney failure, unspecified: Secondary | ICD-10-CM | POA: Diagnosis present

## 2015-09-25 DIAGNOSIS — Z853 Personal history of malignant neoplasm of breast: Secondary | ICD-10-CM | POA: Diagnosis not present

## 2015-09-25 DIAGNOSIS — Z950 Presence of cardiac pacemaker: Secondary | ICD-10-CM | POA: Diagnosis not present

## 2015-09-25 DIAGNOSIS — Z79899 Other long term (current) drug therapy: Secondary | ICD-10-CM | POA: Diagnosis not present

## 2015-09-25 DIAGNOSIS — I110 Hypertensive heart disease with heart failure: Secondary | ICD-10-CM | POA: Diagnosis present

## 2015-09-25 DIAGNOSIS — Z87891 Personal history of nicotine dependence: Secondary | ICD-10-CM | POA: Diagnosis not present

## 2015-09-25 DIAGNOSIS — Z7901 Long term (current) use of anticoagulants: Secondary | ICD-10-CM | POA: Diagnosis not present

## 2015-09-25 DIAGNOSIS — I5032 Chronic diastolic (congestive) heart failure: Secondary | ICD-10-CM | POA: Diagnosis present

## 2015-09-25 DIAGNOSIS — J111 Influenza due to unidentified influenza virus with other respiratory manifestations: Secondary | ICD-10-CM | POA: Diagnosis not present

## 2015-09-25 DIAGNOSIS — I4891 Unspecified atrial fibrillation: Secondary | ICD-10-CM | POA: Diagnosis present

## 2015-09-25 DIAGNOSIS — M1990 Unspecified osteoarthritis, unspecified site: Secondary | ICD-10-CM | POA: Diagnosis present

## 2015-09-25 DIAGNOSIS — I480 Paroxysmal atrial fibrillation: Secondary | ICD-10-CM | POA: Diagnosis not present

## 2015-09-25 DIAGNOSIS — M542 Cervicalgia: Secondary | ICD-10-CM | POA: Diagnosis present

## 2015-09-25 DIAGNOSIS — L03211 Cellulitis of face: Secondary | ICD-10-CM | POA: Diagnosis present

## 2015-09-25 DIAGNOSIS — G8929 Other chronic pain: Secondary | ICD-10-CM | POA: Diagnosis present

## 2015-09-25 DIAGNOSIS — I10 Essential (primary) hypertension: Secondary | ICD-10-CM | POA: Diagnosis not present

## 2015-09-25 DIAGNOSIS — D696 Thrombocytopenia, unspecified: Secondary | ICD-10-CM | POA: Diagnosis present

## 2015-09-25 DIAGNOSIS — M791 Myalgia: Secondary | ICD-10-CM | POA: Diagnosis not present

## 2015-09-25 DIAGNOSIS — R509 Fever, unspecified: Secondary | ICD-10-CM | POA: Diagnosis present

## 2015-09-25 DIAGNOSIS — B349 Viral infection, unspecified: Secondary | ICD-10-CM | POA: Diagnosis present

## 2015-09-25 DIAGNOSIS — E039 Hypothyroidism, unspecified: Secondary | ICD-10-CM | POA: Diagnosis present

## 2015-09-25 DIAGNOSIS — E119 Type 2 diabetes mellitus without complications: Secondary | ICD-10-CM | POA: Diagnosis present

## 2015-09-25 DIAGNOSIS — J449 Chronic obstructive pulmonary disease, unspecified: Secondary | ICD-10-CM | POA: Diagnosis present

## 2015-09-25 DIAGNOSIS — E860 Dehydration: Secondary | ICD-10-CM | POA: Diagnosis present

## 2015-09-25 DIAGNOSIS — E876 Hypokalemia: Secondary | ICD-10-CM | POA: Diagnosis present

## 2015-09-25 DIAGNOSIS — R110 Nausea: Secondary | ICD-10-CM | POA: Diagnosis not present

## 2015-09-25 DIAGNOSIS — I482 Chronic atrial fibrillation: Secondary | ICD-10-CM | POA: Diagnosis not present

## 2015-09-25 LAB — BASIC METABOLIC PANEL
ANION GAP: 9 (ref 5–15)
BUN: 16 mg/dL (ref 6–20)
CALCIUM: 8.2 mg/dL — AB (ref 8.9–10.3)
CO2: 21 mmol/L — ABNORMAL LOW (ref 22–32)
Chloride: 105 mmol/L (ref 101–111)
Creatinine, Ser: 0.88 mg/dL (ref 0.44–1.00)
GFR, EST NON AFRICAN AMERICAN: 59 mL/min — AB (ref >60)
Glucose, Bld: 131 mg/dL — ABNORMAL HIGH (ref 65–99)
Potassium: 3 mmol/L — ABNORMAL LOW (ref 3.5–5.1)
SODIUM: 135 mmol/L (ref 135–145)

## 2015-09-25 LAB — TSH: TSH: 0.418 u[IU]/mL (ref 0.350–4.500)

## 2015-09-25 LAB — CBC
HCT: 34.4 % — ABNORMAL LOW (ref 36.0–46.0)
Hemoglobin: 11.9 g/dL — ABNORMAL LOW (ref 12.0–15.0)
MCH: 30.1 pg (ref 26.0–34.0)
MCHC: 34.6 g/dL (ref 30.0–36.0)
MCV: 86.9 fL (ref 78.0–100.0)
Platelets: 100 10*3/uL — ABNORMAL LOW (ref 150–400)
RBC: 3.96 MIL/uL (ref 3.87–5.11)
RDW: 14.3 % (ref 11.5–15.5)
WBC: 6.8 10*3/uL (ref 4.0–10.5)

## 2015-09-25 LAB — URINE CULTURE

## 2015-09-25 LAB — HEMOGLOBIN A1C
Hgb A1c MFr Bld: 6.5 % — ABNORMAL HIGH (ref 4.8–5.6)
Mean Plasma Glucose: 140 mg/dL

## 2015-09-25 LAB — MAGNESIUM: MAGNESIUM: 1.8 mg/dL (ref 1.7–2.4)

## 2015-09-25 LAB — INFLUENZA PANEL BY PCR (TYPE A & B)
H1N1FLUPCR: NOT DETECTED
Influenza A By PCR: NEGATIVE
Influenza B By PCR: NEGATIVE

## 2015-09-25 MED ORDER — MORPHINE SULFATE (PF) 2 MG/ML IV SOLN
1.0000 mg | INTRAVENOUS | Status: DC | PRN
Start: 2015-09-25 — End: 2015-09-29
  Administered 2015-09-25 – 2015-09-26 (×2): 1 mg via INTRAVENOUS
  Administered 2015-09-27: 2 mg via INTRAVENOUS
  Filled 2015-09-25 (×3): qty 1

## 2015-09-25 MED ORDER — POTASSIUM CHLORIDE 10 MEQ/100ML IV SOLN
10.0000 meq | INTRAVENOUS | Status: AC
  Administered 2015-09-25 (×2): 10 meq via INTRAVENOUS
  Filled 2015-09-25 (×2): qty 100

## 2015-09-25 MED ORDER — METHOCARBAMOL 1000 MG/10ML IJ SOLN
500.0000 mg | Freq: Once | INTRAVENOUS | Status: AC
  Administered 2015-09-25: 500 mg via INTRAVENOUS
  Filled 2015-09-25: qty 5

## 2015-09-25 MED ORDER — OXYCODONE-ACETAMINOPHEN 5-325 MG PO TABS
1.0000 | ORAL_TABLET | ORAL | Status: DC | PRN
Start: 2015-09-25 — End: 2015-09-29
  Administered 2015-09-26 – 2015-09-28 (×5): 2 via ORAL
  Administered 2015-09-28: 1 via ORAL
  Filled 2015-09-25 (×3): qty 2
  Filled 2015-09-25: qty 1
  Filled 2015-09-25 (×2): qty 2

## 2015-09-25 NOTE — Progress Notes (Signed)
Initial Nutrition Assessment  DOCUMENTATION CODES:   Obesity unspecified  INTERVENTION:  - Continue Ensure Enlive BID, each supplement provides 350 kcal and 20 grams of protein - Encourage PO intakes of meals and supplements - RD will continue to monitor for needs  NUTRITION DIAGNOSIS:   Inadequate oral intake related to acute illness, nausea, poor appetite as evidenced by per patient/family report.  GOAL:   Patient will meet greater than or equal to 90% of their needs  MONITOR:   PO intake, Supplement acceptance, Weight trends, Labs, I & O's  REASON FOR ASSESSMENT:   Malnutrition Screening Tool  ASSESSMENT:   81 y.o. female hypertension, hypothyroidism, COPD, atrial fibrillation, chronic diastolic heart failure, Left breast cancer S/p lumpectomy, second degree heart block with PPM placement, diabetes mellitus, h/o vertigo and chronic neck pain, presents to ED with the above symptoms after being treated for facial cellulitis two weeks ago, . She reports some neck pain, which has been chronic over several months. She denies any other complaints. Her work up in ED shows a negative CXR and UA and a LP was done by EDP for evaluation of meningitis for her history of neck and fever. CSF analysis sent and blood cultures were drawn. Influenza PCR ordered. She was referred to medical service for evaluation of fever of unclear etiology.   Pt seen for MST. BMI indicates obesity. Pt reports she had a few bites of omelet and juice for breakfast this AM. She denies abdominal pain or nausea at this time. Pt states she was feeling week and decreased appetite PTA but that these areas have improved since admission. Daughter, who is at bedside, states that PTA pt was feeling nauseated with all intakes. Pt states that since Thursday (10/13) she has mainly been consuming liquids due to nausea. She states she continued this until admission and has slowly been trying more solid foods.   Daughter states  that pt drank Ensure Enlive this AM. Pt states she liked this supplement and it did not cause any discomfort for her. Encouraged consumption of supplement especially if she is unable to eat well.  No muscle or fat wasting present at this time. Unable to obtain weight hx at time of this visit. Chart review shows 20 lb weight loss (11.4% body weight) in the past 17 months which is not significant for time frame. Will need to ask about this in further detail at follow-up.  Not meeting needs at this time. Medications reviewed. Labs reviewed; K: 3 mmol/L, Ca: 8.2 mg/dL, GFR: 59.   Diet Order:  Diet regular Room service appropriate?: Yes; Fluid consistency:: Thin  Skin:  Reviewed, no issues  Last BM:  10/17  Height:   Ht Readings from Last 1 Encounters:  09/24/15 '4\' 9"'$  (1.448 m)    Weight:   Wt Readings from Last 1 Encounters:  09/24/15 155 lb (70.308 kg)    Ideal Body Weight:  47.73 kg (kg)  BMI:  Body mass index is 33.53 kg/(m^2).  Estimated Nutritional Needs:   Kcal:  1610-9604  Protein:  55-65 grams  Fluid:  2 L/day  EDUCATION NEEDS:   No education needs identified at this time     Jarome Matin, RD, LDN Inpatient Clinical Dietitian Pager # [phone] After hours/weekend pager # [phone]

## 2015-09-25 NOTE — Evaluation (Signed)
Physical Therapy Evaluation Patient Details Name: Maria Washington MRN: [id number] DOB: 12-May-1929 Today's Date: 09/25/2015   History of Present Illness  Maria Washington is a 81 y.o. female hypertension, hypothyroidism, COPD, atrial fibrillation, chronic diastolic heart failure, Left breast cancer S/p lumpectomy, second degree heart block with PPM placement, diabetes mellitus, h/o vertigo and chronic neck pain, presents to ED with fever, myalgia after being treated for facial cellulitis two weeks ago,  s/p lumbar puncture to r/o meningitis results pending, flu negative.   Clinical Impression  Pt admitted with above diagnosis. Pt currently with functional limitations due to the deficits listed below (see PT Problem List). Pt ambulated 38' with RW, distance limited by fatigue. At baseline she drives, is independent with mobility and ADLs. Good progress expected.  Pt will benefit from skilled PT to increase their independence and safety with mobility to allow discharge to the venue listed below.       Follow Up Recommendations Home health PT (HHPT vs no f/u depending on progress)    Equipment Recommendations  None recommended by PT    Recommendations for Other Services       Precautions / Restrictions Precautions Precautions: Fall Precaution Comments: pt reports 2 falls in past year, tripped over flowers in garden Restrictions Weight Bearing Restrictions: No      Mobility  Bed Mobility Overal bed mobility: Needs Assistance Bed Mobility: Sit to Supine           General bed mobility comments: used bedrail, sit to supine, min A to bring feet into bed  Transfers Overall transfer level: Needs assistance Equipment used: Rolling walker (2 wheeled) Transfers: Sit to/from Stand Sit to Stand: Min guard         General transfer comment: verbal cues for hand placement  Ambulation/Gait Ambulation/Gait assistance: Min guard Ambulation Distance (Feet): 50 Feet Assistive device:  Rolling walker (2 wheeled) Gait Pattern/deviations: Step-through pattern;Decreased stride length   Gait velocity interpretation: Below normal speed for age/gender General Gait Details: steady with RW, distance limited by fatigue, 2/4 dyspnea  Stairs            Wheelchair Mobility    Modified Rankin (Stroke Patients Only)       Balance Overall balance assessment: Modified Independent                                           Pertinent Vitals/Pain Pain Assessment: 0-10 Pain Score: 8  Pain Location: L side of neck Pain Descriptors / Indicators: Sore Pain Intervention(s): Monitored during session;Limited activity within patient's tolerance;Premedicated before session    Home Living Family/patient expects to be discharged to:: Private residence Living Arrangements: Children Available Help at Discharge: Family;Available 24 hours/day Type of Home: House Home Access: Stairs to enter Entrance Stairs-Rails: Right Entrance Stairs-Number of Steps: 4 Home Layout: One level Home Equipment: Walker - 4 wheels;Bedside commode;Shower seat      Prior Function Level of Independence: Independent         Comments: pt drives, walks without AD     Hand Dominance        Extremity/Trunk Assessment   Upper Extremity Assessment: Overall WFL for tasks assessed           Lower Extremity Assessment: Overall WFL for tasks assessed      Cervical / Trunk Assessment: Normal  Communication   Communication: HOH  Cognition Arousal/Alertness: Awake/alert  Behavior During Therapy: WFL for tasks assessed/performed Overall Cognitive Status: Within Functional Limits for tasks assessed                      General Comments      Exercises        Assessment/Plan    PT Assessment Patient needs continued PT services  PT Diagnosis Generalized weakness   PT Problem List Decreased activity tolerance;Decreased balance;Decreased mobility;Pain  PT  Treatment Interventions Gait training;Stair training;Functional mobility training;Therapeutic activities;Patient/family education;Therapeutic exercise;Balance training   PT Goals (Current goals can be found in the Care Plan section) Acute Rehab PT Goals Patient Stated Goal: to get back to independence PT Goal Formulation: With patient/family Time For Goal Achievement: 10/09/15 Potential to Achieve Goals: Good    Frequency Min 3X/week   Barriers to discharge        Co-evaluation               End of Session Equipment Utilized During Treatment: Gait belt Activity Tolerance: Patient limited by fatigue Patient left: in bed;with call bell/phone within reach;with family/visitor present Nurse Communication: Mobility status    Functional Assessment Tool Used: clinical judgement Functional Limitation: Mobility: Walking and moving around Mobility: Walking and Moving Around Current Status [phone]): At least 1 percent but less than 20 percent impaired, limited or restricted Mobility: Walking and Moving Around Goal Status [phone]): 0 percent impaired, limited or restricted    Time: 8381-8403 PT Time Calculation (min) (ACUTE ONLY): 15 min   Charges:   PT Evaluation $Initial PT Evaluation Tier I: 1 Procedure     PT G Codes:   PT G-Codes **NOT FOR INPATIENT CLASS** Functional Assessment Tool Used: clinical judgement Functional Limitation: Mobility: Walking and moving around Mobility: Walking and Moving Around Current Status ([id number]): At least 1 percent but less than 20 percent impaired, limited or restricted Mobility: Walking and Moving Around Goal Status [phone]): 0 percent impaired, limited or restricted    Maria Washington 09/25/2015, 12:00 PM [phone]

## 2015-09-25 NOTE — Progress Notes (Signed)
[facility] PROGRESS NOTE  Maria Washington HGD:[id number] DOB: [birth year] DOA: 09/24/2015 PCP: Mathews Argyle, MD  Assessment/Plan: Fever and chills with myalgia's; Probably a viral syndrome, because she has a h/o neck pain and fever, she underwent LP in ED and CSF analysis shows 1 WBC and mildly elevated protein.  HSV 1 ANALYSIS is pending.    Influenza PCR i snegative.   Hypokalemia; Replete today and check in am.   Copd: no wheezing heard,.   Atrial fibrillation; rate controlled. Resume eliquis and cardizem.   Chronic diastolic heart failure; She appears to be dehydrated, on IV fluids.  Monitor.  Holding lasix   Dehydration. gnetle hydration , much improved.   Hypertension; Well controlled.   Diabetes mellitus; Hgba1c is 6.5  and SSI ordered.  CBG (last 3)  No results for input(s): GLUCAP in the last 72 hours.   Acute renal failure: resolved with fluids.     Mild thrombocytopenia; Monitor trend.  On eliquis.    Neck pain: suspect she had a fall as reported by family.  Robaxin added for muscle spasm.    Facial cellulitis : appears to have resolved.  Would not continue antibiotics for this.    Code Status: full code.  Family Communication: multiple family members at bedside.  Disposition Plan: pending PT eval, possibly home with home pt IN AM.    Consultants:  NONE  Procedures:  Csf ANALYSIS.   Antibiotics:  VANCOMYCIN  FORTAZ AND ACYCLOVIR.   HPI/Subjective: Reports feeling a little better, but still struggling with neck pain.   Objective: Filed Vitals:   09/25/15 1426  BP: 137/53  Pulse: 71  Temp: 97.8 F (36.6 C)  Resp: 18    Intake/Output Summary (Last 24 hours) at 09/25/15 1750 Last data filed at 09/24/15 1832  Gross per 24 hour  Intake    200 ml  Output      0 ml  Net    200 ml   Filed Weights   09/24/15 1642 09/24/15 1732  Weight: 70.308 kg (155 lb) 70.308 kg (155 lb)    Exam:   General:  Alert  afebrile comfortable  Cardiovascular: s1s2  Respiratory: ctab  Abdomen: soft NT nd bs+  Musculoskeletal: no pedal edema.   Data Reviewed: Basic Metabolic Panel:  Recent Labs Lab 09/24/15 1350 09/25/15 0545  NA 134* 135  K 3.1* 3.0*  CL 104 105  CO2 20* 21*  GLUCOSE 201* 131*  BUN 18 16  CREATININE 1.01* 0.88  CALCIUM 8.7* 8.2*  MG  --  1.8   Liver Function Tests:  Recent Labs Lab 09/24/15 1350  AST 34  ALT 32  ALKPHOS 71  BILITOT 0.8  PROT 6.8  ALBUMIN 3.3*   No results for input(s): LIPASE, AMYLASE in the last 168 hours. No results for input(s): AMMONIA in the last 168 hours. CBC:  Recent Labs Lab 09/24/15 1350 09/25/15 0545  WBC 7.0 6.8  NEUTROABS 5.9  --   HGB 13.0 11.9*  HCT 36.9 34.4*  MCV 86.2 86.9  PLT 110* 100*   Cardiac Enzymes: No results for input(s): CKTOTAL, CKMB, CKMBINDEX, TROPONINI in the last 168 hours. BNP (last 3 results) No results for input(s): BNP in the last 8760 hours.  ProBNP (last 3 results) No results for input(s): PROBNP in the last 8760 hours.  CBG: No results for input(s): GLUCAP in the last 168 hours.  Recent Results (from the past 240 hour(s))  Culture, blood (routine x 2)  Status: None (Preliminary result)   Collection Time: 09/24/15  1:50 PM  Result Value Ref Range Status   Specimen Description BLOOD RIGHT ARM  Final   Special Requests BOTTLES DRAWN AEROBIC AND ANAEROBIC 5CC  Final   Culture   Final    NO GROWTH < 24 HOURS Performed at [facility]    Report Status PENDING  Incomplete  Urine culture     Status: None   Collection Time: 09/24/15  2:15 PM  Result Value Ref Range Status   Specimen Description URINE, CLEAN CATCH  Final   Special Requests NONE  Final   Culture   Final    2,000 COLONIES/mL INSIGNIFICANT GROWTH Performed at [facility]    Report Status 09/25/2015 FINAL  Final  CSF culture with Stat gram stain     Status: None (Preliminary result)   Collection Time:  09/24/15  4:08 PM  Result Value Ref Range Status   Specimen Description CSF  Final   Special Requests NONE  Final   Gram Stain   Final    WBC PRESENT, PREDOMINANTLY MONONUCLEAR NO ORGANISMS SEEN Gram Stain Report Called to,Read Back By and Verified With: MARTIN,M AT 1850 ON 101616 BY HOOKER,B CYTOSPIN SMEAR Performed at [facility]    Culture PENDING  Incomplete   Report Status PENDING  Incomplete  Culture, blood (routine x 2)     Status: None (Preliminary result)   Collection Time: 09/24/15  4:15 PM  Result Value Ref Range Status   Specimen Description BLOOD RIGHT HAND  Final   Special Requests IN PEDIATRIC BOTTLE 5CC  Final   Culture   Final    NO GROWTH < 24 HOURS Performed at [facility]    Report Status PENDING  Incomplete     Studies: Dg Chest 2 View  09/24/2015  CLINICAL DATA:  Productive cough and nausea for 3 days. Chills. Anorexia. EXAM: CHEST  2 VIEW COMPARISON:  08/25/2014 FINDINGS: The heart size and mediastinal contours are within normal limits. Dual lead transvenous pacemaker remains in appropriate position. Both lungs are clear. No evidence of pneumothorax or pleural effusion. Surgical clips again seen in the left axilla. IMPRESSION: No active cardiopulmonary disease. Electronically Signed   By: Earle Gell M.D.   On: 09/24/2015 14:50   Ct Cervical Spine Wo Contrast  09/24/2015  CLINICAL DATA:  Patient fell last month. Patient is complaining of severe neck pain. History of breast carcinoma. EXAM: CT CERVICAL SPINE WITHOUT CONTRAST TECHNIQUE: Multidetector CT imaging of the cervical spine was performed without intravenous contrast. Multiplanar CT image reconstructions were also generated. COMPARISON:  None. FINDINGS: No fracture. No bone lesion. No spondylolisthesis. There is moderate loss disc height at C5-C6 and C6-C7. Small endplate osteophytes are noted from C4, C5, C6 and C7. Uncovertebral spurring leads to mild neural foraminal narrowing  bilaterally at C5-C6 and C6-C7. Diffuse disc bulging leads to mild to moderate central stenosis at C5-C6. Soft tissues are unremarkable. Lung apices show minor scarring but are otherwise clear. IMPRESSION: 1. No fracture or acute finding. 2. Degenerative changes as described. Electronically Signed   By: Lajean Manes M.D.   On: 09/24/2015 20:59    Scheduled Meds: . acyclovir  480 mg Intravenous Q12H  . apixaban  5 mg Oral BID  . cefTAZidime (FORTAZ)  IV  2 g Intravenous Q12H  . diltiazem  180 mg Oral Daily  . feeding supplement (ENSURE ENLIVE)  237 mL Oral BID BM  . levothyroxine  137 mcg Oral QAC breakfast  . lidocaine  30 mL Infiltration Once  . potassium chloride SA  20 mEq Oral BID  . vancomycin  1,000 mg Intravenous Q24H   Continuous Infusions: . sodium chloride 75 mL/hr at 09/24/15 1811    Active Problems:   Fever    Time spent: 35 minutes.     [facility] Pager [phone] . If 7PM-7AM, please contact night-coverage at [email or web address], password [facility] 09/25/2015, 5:50 PM  LOS: 1 day

## 2015-09-26 LAB — BASIC METABOLIC PANEL
ANION GAP: 8 (ref 5–15)
BUN: 9 mg/dL (ref 6–20)
CALCIUM: 8.5 mg/dL — AB (ref 8.9–10.3)
CO2: 22 mmol/L (ref 22–32)
Chloride: 106 mmol/L (ref 101–111)
Creatinine, Ser: 0.74 mg/dL (ref 0.44–1.00)
Glucose, Bld: 123 mg/dL — ABNORMAL HIGH (ref 65–99)
POTASSIUM: 3 mmol/L — AB (ref 3.5–5.1)
Sodium: 136 mmol/L (ref 135–145)

## 2015-09-26 MED ORDER — VANCOMYCIN HCL IN DEXTROSE 750-5 MG/150ML-% IV SOLN
750.0000 mg | Freq: Two times a day (BID) | INTRAVENOUS | Status: DC
  Filled 2015-09-26: qty 150

## 2015-09-26 MED ORDER — POTASSIUM CHLORIDE CRYS ER 20 MEQ PO TBCR
40.0000 meq | EXTENDED_RELEASE_TABLET | Freq: Two times a day (BID) | ORAL | Status: AC
  Administered 2015-09-26: 40 meq via ORAL
  Filled 2015-09-26 (×2): qty 2

## 2015-09-26 MED ORDER — POTASSIUM CHLORIDE 10 MEQ/100ML IV SOLN
10.0000 meq | INTRAVENOUS | Status: AC
  Administered 2015-09-26: 10 meq via INTRAVENOUS
  Filled 2015-09-26: qty 100

## 2015-09-26 NOTE — Care Management Important Message (Signed)
Important Message  Patient Details IM Letter given to Suzanne/Case Manager to present to Patient.Important Message  Patient Details  Name: Maria Washington MRN: [id number] Date of Birth: January 12, 1929   Medicare Important Message Given:  Yes-second notification given    Camillo Flaming 09/26/2015, 11:34 AM Name: Maria Washington MRN: [id number] Date of Birth: [birth year]   Medicare Important Message Given:  Yes-second notification given    Camillo Flaming 09/26/2015, 11:33 AM

## 2015-09-26 NOTE — Progress Notes (Signed)
[facility] PROGRESS NOTE  Maria Washington:[id number] DOB: [birth year] DOA: 09/24/2015 PCP: Mathews Argyle, MD  Assessment/Plan: Fever and chills with myalgia's; Probably a viral syndrome, because she has a h/o neck pain and fever and facial cellulitis, she underwent LP in ED and CSF analysis shows 1 WBC and mildly elevated protein. Discussed with Dr Linus Salmons and stopped all antibiotics. She denies any meningeal signs.    Influenza PCR is negative.   Hypokalemia; Replete today and check in am.   Copd: no wheezing heard,.   Atrial fibrillation; rate controlled. Resume eliquis and cardizem.   Chronic diastolic heart failure; She appears to be dehydrated, on IV fluids.  Monitor.  Holding lasix.  Dehydration. gnetle hydration , much improved.   Hypertension; Well controlled.   Diabetes mellitus; Hgba1c is 6.5  and SSI ordered.  CBG (last 3)  No results for input(s): GLUCAP in the last 72 hours.   Acute renal failure: resolved with fluids.     Mild thrombocytopenia; Monitor trend.  On eliquis.    Neck pain: suspect she had a fall as reported by family.  Robaxin added for muscle spasm.  PT eval.  CT cervical spine ordered and showed degenerative changes.     Facial cellulitis : appears to have resolved.  Would not continue antibiotics for this.    Code Status: full code.  Family Communication: multiple family members at bedside.  Disposition Plan: pending PT eval, possibly home with home pt IN AM if electrolytes are within normal limits.    Consultants:  NONE  Procedures:  Csf ANALYSIS.   Antibiotics:  VANCOMYCIN  FORTAZ AND ACYCLOVIR.   HPI/Subjective: Reports feeling a little better, but reports some hallucinations last night.   Objective: Filed Vitals:   09/26/15 1500  BP: 116/50  Pulse: 72  Temp: 97.8 F (36.6 C)  Resp: 20    Intake/Output Summary (Last 24 hours) at 09/26/15 1952 Last data filed at 09/26/15 1900  Gross per 24 hour  Intake   1995 ml  Output      0 ml  Net   1995 ml   Filed Weights   09/24/15 1642 09/24/15 1732  Weight: 70.308 kg (155 lb) 70.308 kg (155 lb)    Exam:   General:  Alert afebrile comfortable  Cardiovascular: s1s2  Respiratory: ctab  Abdomen: soft NT nd bs+  Musculoskeletal: no pedal edema.   Data Reviewed: Basic Metabolic Panel:  Recent Labs Lab 09/24/15 1350 09/25/15 0545 09/26/15 1050  NA 134* 135 136  K 3.1* 3.0* 3.0*  CL 104 105 106  CO2 20* 21* 22  GLUCOSE 201* 131* 123*  BUN '18 16 9  '$ CREATININE 1.01* 0.88 0.74  CALCIUM 8.7* 8.2* 8.5*  MG  --  1.8  --    Liver Function Tests:  Recent Labs Lab 09/24/15 1350  AST 34  ALT 32  ALKPHOS 71  BILITOT 0.8  PROT 6.8  ALBUMIN 3.3*   No results for input(s): LIPASE, AMYLASE in the last 168 hours. No results for input(s): AMMONIA in the last 168 hours. CBC:  Recent Labs Lab 09/24/15 1350 09/25/15 0545  WBC 7.0 6.8  NEUTROABS 5.9  --   HGB 13.0 11.9*  HCT 36.9 34.4*  MCV 86.2 86.9  PLT 110* 100*   Cardiac Enzymes: No results for input(s): CKTOTAL, CKMB, CKMBINDEX, TROPONINI in the last 168 hours. BNP (last 3 results) No results for input(s): BNP in the last 8760 hours.  ProBNP (last 3 results) No results  for input(s): PROBNP in the last 8760 hours.  CBG: No results for input(s): GLUCAP in the last 168 hours.  Recent Results (from the past 240 hour(s))  Culture, blood (routine x 2)     Status: None (Preliminary result)   Collection Time: 09/24/15  1:50 PM  Result Value Ref Range Status   Specimen Description BLOOD RIGHT ARM  Final   Special Requests BOTTLES DRAWN AEROBIC AND ANAEROBIC 5CC  Final   Culture   Final    NO GROWTH 2 DAYS Performed at [facility]    Report Status PENDING  Incomplete  Urine culture     Status: None   Collection Time: 09/24/15  2:15 PM  Result Value Ref Range Status   Specimen Description URINE, CLEAN CATCH  Final   Special  Requests NONE  Final   Culture   Final    2,000 COLONIES/mL INSIGNIFICANT GROWTH Performed at [facility]    Report Status 09/25/2015 FINAL  Final  CSF culture with Stat gram stain     Status: None (Preliminary result)   Collection Time: 09/24/15  4:08 PM  Result Value Ref Range Status   Specimen Description CSF  Final   Special Requests NONE  Final   Gram Stain   Final    WBC PRESENT, PREDOMINANTLY MONONUCLEAR NO ORGANISMS SEEN Gram Stain Report Called to,Read Back By and Verified With: MARTIN,M AT 1850 ON 101616 BY HOOKER,B CYTOSPIN SMEAR    Culture   Final    NO GROWTH 1 DAY Performed at [facility]    Report Status PENDING  Incomplete  Culture, blood (routine x 2)     Status: None (Preliminary result)   Collection Time: 09/24/15  4:15 PM  Result Value Ref Range Status   Specimen Description BLOOD RIGHT HAND  Final   Special Requests IN PEDIATRIC BOTTLE 5CC  Final   Culture   Final    NO GROWTH 2 DAYS Performed at [facility]    Report Status PENDING  Incomplete     Studies: Ct Cervical Spine Wo Contrast  09/24/2015  CLINICAL DATA:  Patient fell last month. Patient is complaining of severe neck pain. History of breast carcinoma. EXAM: CT CERVICAL SPINE WITHOUT CONTRAST TECHNIQUE: Multidetector CT imaging of the cervical spine was performed without intravenous contrast. Multiplanar CT image reconstructions were also generated. COMPARISON:  None. FINDINGS: No fracture. No bone lesion. No spondylolisthesis. There is moderate loss disc height at C5-C6 and C6-C7. Small endplate osteophytes are noted from C4, C5, C6 and C7. Uncovertebral spurring leads to mild neural foraminal narrowing bilaterally at C5-C6 and C6-C7. Diffuse disc bulging leads to mild to moderate central stenosis at C5-C6. Soft tissues are unremarkable. Lung apices show minor scarring but are otherwise clear. IMPRESSION: 1. No fracture or acute finding. 2. Degenerative changes as described.  Electronically Signed   By: Lajean Manes M.D.   On: 09/24/2015 20:59    Scheduled Meds: . apixaban  5 mg Oral BID  . diltiazem  180 mg Oral Daily  . feeding supplement (ENSURE ENLIVE)  237 mL Oral BID BM  . levothyroxine  137 mcg Oral QAC breakfast  . lidocaine  30 mL Infiltration Once  . potassium chloride  40 mEq Oral BID   Continuous Infusions: . sodium chloride 75 mL/hr at 09/26/15 1858    Active Problems:   Fever    Time spent: 25 minutes.     [facility] Pager [phone] . If  7PM-7AM, please contact night-coverage at [email or web address], password [facility] 09/26/2015, 7:52 PM  LOS: 2 days

## 2015-09-27 ENCOUNTER — Inpatient Hospital Stay (HOSPITAL_COMMUNITY): Payer: Medicare Other

## 2015-09-27 DIAGNOSIS — I10 Essential (primary) hypertension: Secondary | ICD-10-CM

## 2015-09-27 DIAGNOSIS — E860 Dehydration: Secondary | ICD-10-CM

## 2015-09-27 DIAGNOSIS — I482 Chronic atrial fibrillation: Secondary | ICD-10-CM

## 2015-09-27 DIAGNOSIS — M791 Myalgia: Secondary | ICD-10-CM

## 2015-09-27 DIAGNOSIS — E039 Hypothyroidism, unspecified: Secondary | ICD-10-CM

## 2015-09-27 DIAGNOSIS — J449 Chronic obstructive pulmonary disease, unspecified: Secondary | ICD-10-CM

## 2015-09-27 DIAGNOSIS — R110 Nausea: Secondary | ICD-10-CM

## 2015-09-27 DIAGNOSIS — R509 Fever, unspecified: Secondary | ICD-10-CM

## 2015-09-27 LAB — BASIC METABOLIC PANEL
Anion gap: 7 (ref 5–15)
BUN: 8 mg/dL (ref 6–20)
CHLORIDE: 110 mmol/L (ref 101–111)
CO2: 23 mmol/L (ref 22–32)
CREATININE: 0.65 mg/dL (ref 0.44–1.00)
Calcium: 8.7 mg/dL — ABNORMAL LOW (ref 8.9–10.3)
GFR calc non Af Amer: >60 mL/min (ref >60)
Glucose, Bld: 102 mg/dL — ABNORMAL HIGH (ref 65–99)
POTASSIUM: 3.3 mmol/L — AB (ref 3.5–5.1)
SODIUM: 140 mmol/L (ref 135–145)

## 2015-09-27 LAB — MAGNESIUM: MAGNESIUM: 1.8 mg/dL (ref 1.7–2.4)

## 2015-09-27 MED ORDER — POTASSIUM CHLORIDE CRYS ER 20 MEQ PO TBCR
20.0000 meq | EXTENDED_RELEASE_TABLET | Freq: Two times a day (BID) | ORAL | Status: DC
  Administered 2015-09-27 – 2015-09-29 (×4): 20 meq via ORAL
  Filled 2015-09-27 (×8): qty 1

## 2015-09-27 MED ORDER — FUROSEMIDE 40 MG PO TABS
40.0000 mg | ORAL_TABLET | Freq: Every day | ORAL | Status: DC
  Administered 2015-09-27 – 2015-09-29 (×3): 40 mg via ORAL
  Filled 2015-09-27 (×3): qty 1

## 2015-09-27 MED ORDER — HYDROXYZINE HCL 25 MG PO TABS
25.0000 mg | ORAL_TABLET | Freq: Four times a day (QID) | ORAL | Status: DC | PRN
  Administered 2015-09-27: 25 mg via ORAL
  Filled 2015-09-27: qty 1

## 2015-09-27 MED ORDER — VITAMIN B-12 1000 MCG PO TABS
1000.0000 ug | ORAL_TABLET | Freq: Every day | ORAL | Status: DC
  Administered 2015-09-27 – 2015-09-29 (×3): 1000 ug via ORAL
  Filled 2015-09-27 (×3): qty 1

## 2015-09-27 MED ORDER — MAGNESIUM SULFATE 2 GM/50ML IV SOLN
2.0000 g | Freq: Once | INTRAVENOUS | Status: AC
  Administered 2015-09-27: 2 g via INTRAVENOUS
  Filled 2015-09-27: qty 50

## 2015-09-27 MED ORDER — POTASSIUM CHLORIDE CRYS ER 20 MEQ PO TBCR
40.0000 meq | EXTENDED_RELEASE_TABLET | ORAL | Status: AC
  Administered 2015-09-27 (×2): 40 meq via ORAL
  Filled 2015-09-27: qty 2

## 2015-09-27 MED ORDER — DIPHENHYDRAMINE HCL 25 MG PO CAPS: 25.0000 mg | ORAL_CAPSULE | Freq: Three times a day (TID) | ORAL | Status: DC | PRN

## 2015-09-27 NOTE — Progress Notes (Signed)
[facility] PROGRESS NOTE  TYIESHA BRACKNEY IOM:[id number] DOB: Jun 30, 1929 DOA: 09/24/2015 PCP: Mathews Argyle, MD  Assessment/Plan: #1 fever/myalgias/chills Probable viral syndrome however patient still spiking fevers after antibiotics were discontinued yesterday. Patient with a MAXIMUM TEMPERATURE of 101.1 over the past 24 hours. Patient complaining of diffuse pain. Influenza PCR is negative. Chest x-ray on admission was negative. Urinalysis was negative. Will repeat chest x-ray to see the pneumonia has fluffed out. Patient status post lumbar puncture on admission with a glucose of 64 total protein of 72 colorless, CSF culture and Gram stain with no growth to date. Urine cultures negative. Blood cultures pending. Consulted ID for further evaluation and management.  #2 hypokalemia Replete.  #3 COPD Stable.  #4 atrial fibrillation Continue Cardizem for rate control. Eliquis for anticoagulation.  #5 chronic diastolic heart failure Stable. Will resume home regimen Lasix.  #6 hypertension Stable. Continue Cardizem.  #7 well-controlled diabetes mellitus Hemoglobin A1c 6.5. Place on sliding scale insulin.  #8 hypothyroidism TSH of 0.418. Continue home dose Synthroid.  #9 thrombocytopenia No active bleeding. Follow.  #10 prophylaxis SCDs for DVT prophylaxis.   Code Status: Full Family Communication: Updated patient and daughter at bedside. Disposition Plan: Home when medically stable.   Consultants:  None  Procedures:  CT C-spine 09/24/2015  Chest x-ray 09/24/2015  Lumbar puncture per ED physician, DR Thomasene Lot 09/24/2015  Antibiotics:  IV Tressie Ellis 09/24/2015>>>> 09/26/2015  IV acyclovir 09/24/2015>>>>> 09/26/2015  IV vancomycin 09/24/2015>>>>> 09/26/2015  HPI/Subjective: Patient states she's feeling better than admission however complaining of pain diffusely all over her body. Patient complaining of generalized weakness. Patient denies any tick bite.  Patient denies any chest pain. No shortness of breath. Patient noted to have a fever last night of 101.1.  Objective: Filed Vitals:   09/27/15 0456  BP: 150/66  Pulse: 63  Temp: 98.3 F (36.8 C)  Resp: 17    Intake/Output Summary (Last 24 hours) at 09/27/15 1334 Last data filed at 09/27/15 0247  Gross per 24 hour  Intake   1245 ml  Output      0 ml  Net   1245 ml   Filed Weights   09/24/15 1642 09/24/15 1732  Weight: 70.308 kg (155 lb) 70.308 kg (155 lb)    Exam:   General:  NAD  Cardiovascular: RRR  Respiratory: CTAB  Abdomen:  Soft, nontender, nondistended, positive bowel sounds.  Musculoskeletal: No clubbing cyanosis or edema.   Data Reviewed: Basic Metabolic Panel:  Recent Labs Lab 09/24/15 1350 09/25/15 0545 09/26/15 1050 09/27/15 0545  NA 134* 135 136 140  K 3.1* 3.0* 3.0* 3.3*  CL 104 105 106 110  CO2 20* 21* 22 23  GLUCOSE 201* 131* 123* 102*  BUN '18 16 9 8  '$ CREATININE 1.01* 0.88 0.74 0.65  CALCIUM 8.7* 8.2* 8.5* 8.7*  MG  --  1.8  --  1.8   Liver Function Tests:  Recent Labs Lab 09/24/15 1350  AST 34  ALT 32  ALKPHOS 71  BILITOT 0.8  PROT 6.8  ALBUMIN 3.3*   No results for input(s): LIPASE, AMYLASE in the last 168 hours. No results for input(s): AMMONIA in the last 168 hours. CBC:  Recent Labs Lab 09/24/15 1350 09/25/15 0545  WBC 7.0 6.8  NEUTROABS 5.9  --   HGB 13.0 11.9*  HCT 36.9 34.4*  MCV 86.2 86.9  PLT 110* 100*   Cardiac Enzymes: No results for input(s): CKTOTAL, CKMB, CKMBINDEX, TROPONINI in the last 168 hours. BNP (last 3 results)  No results for input(s): BNP in the last 8760 hours.  ProBNP (last 3 results) No results for input(s): PROBNP in the last 8760 hours.  CBG: No results for input(s): GLUCAP in the last 168 hours.  Recent Results (from the past 240 hour(s))  Culture, blood (routine x 2)     Status: None (Preliminary result)   Collection Time: 09/24/15  1:50 PM  Result Value Ref Range Status    Specimen Description BLOOD RIGHT ARM  Final   Special Requests BOTTLES DRAWN AEROBIC AND ANAEROBIC 5CC  Final   Culture   Final    NO GROWTH 2 DAYS Performed at [facility]    Report Status PENDING  Incomplete  Urine culture     Status: None   Collection Time: 09/24/15  2:15 PM  Result Value Ref Range Status   Specimen Description URINE, CLEAN CATCH  Final   Special Requests NONE  Final   Culture   Final    2,000 COLONIES/mL INSIGNIFICANT GROWTH Performed at [facility]    Report Status 09/25/2015 FINAL  Final  CSF culture with Stat gram stain     Status: None (Preliminary result)   Collection Time: 09/24/15  4:08 PM  Result Value Ref Range Status   Specimen Description CSF  Final   Special Requests NONE  Final   Gram Stain   Final    WBC PRESENT, PREDOMINANTLY MONONUCLEAR NO ORGANISMS SEEN Gram Stain Report Called to,Read Back By and Verified With: MARTIN,M AT 1850 ON [id number] BY HOOKER,B CYTOSPIN SMEAR    Culture   Final    NO GROWTH 2 DAYS Performed at [facility]    Report Status PENDING  Incomplete  Culture, blood (routine x 2)     Status: None (Preliminary result)   Collection Time: 09/24/15  4:15 PM  Result Value Ref Range Status   Specimen Description BLOOD RIGHT HAND  Final   Special Requests IN PEDIATRIC BOTTLE 5CC  Final   Culture   Final    NO GROWTH 2 DAYS Performed at [facility]    Report Status PENDING  Incomplete     Studies: No results found.  Scheduled Meds: . apixaban  5 mg Oral BID  . diltiazem  180 mg Oral Daily  . feeding supplement (ENSURE ENLIVE)  237 mL Oral BID BM  . furosemide  40 mg Oral Daily  . levothyroxine  137 mcg Oral QAC breakfast  . lidocaine  30 mL Infiltration Once   Continuous Infusions:   Principal Problem:   Fever Active Problems:   History of breast cancer   Hypertension   COPD (chronic obstructive pulmonary disease) (HCC)   Hypothyroidism   Atrial fibrillation (HCC)    Dehydration    Time spent: 51 minutes    Tiegan Terpstra M.D. [facility] Pager [phone]. If 7PM-7AM, please contact night-coverage at [email or web address], password [facility] 09/27/2015, 1:34 PM  LOS: 3 days

## 2015-09-27 NOTE — Progress Notes (Signed)
Physical Therapy Treatment Patient Details Name: Maria Washington MRN: [id number] DOB: 09-29-1929 Today's Date: 09/27/2015    History of Present Illness MICHEALE SCHLACK is a 81 y.o. female hypertension, hypothyroidism, COPD, atrial fibrillation, chronic diastolic heart failure, Left breast cancer S/p lumpectomy, second degree heart block with PPM placement, diabetes mellitus, h/o vertigo and chronic neck pain, presents to ED with fever, myalgia after being treated for facial cellulitis two weeks ago,  s/p lumbar puncture to r/o meningitis results pending, flu negative.     PT Comments    Pt sitting EOB on arrival with daughter who just assisted her on/off BSC.  Assisted with amb pt in hallway a greater distance.    Follow Up Recommendations  Home health PT     Equipment Recommendations  None recommended by PT    Recommendations for Other Services       Precautions / Restrictions Precautions Precautions: Fall Precaution Comments: Droplet Restrictions Weight Bearing Restrictions: No    Mobility  Bed Mobility               General bed mobility comments: Pt sitting EOB on arrival  Transfers Overall transfer level: Needs assistance Equipment used: Rolling walker (2 wheeled) Transfers: Sit to/from Stand Sit to Stand: Supervision;Min guard         General transfer comment: good safety cognition and use of hands  Ambulation/Gait Ambulation/Gait assistance: Min guard Ambulation Distance (Feet): 85 Feet Assistive device: Rolling walker (2 wheeled) Gait Pattern/deviations: Step-through pattern;Trunk flexed Gait velocity: WFL   General Gait Details: tolerated increased distance.  Mild dyspnea.     Stairs            Wheelchair Mobility    Modified Rankin (Stroke Patients Only)       Balance                                    Cognition Arousal/Alertness: Awake/alert Behavior During Therapy: WFL for tasks assessed/performed Overall  Cognitive Status: Within Functional Limits for tasks assessed                      Exercises      General Comments        Pertinent Vitals/Pain Pain Assessment: Faces Faces Pain Scale: Hurts a little bit Pain Location: neck Pain Descriptors / Indicators: Tightness Pain Intervention(s): Monitored during session    Home Living                      Prior Function            PT Goals (current goals can now be found in the care plan section) Progress towards PT goals: Progressing toward goals    Frequency  Min 3X/week    PT Plan      Co-evaluation             End of Session Equipment Utilized During Treatment: Gait belt Activity Tolerance: Patient tolerated treatment well Patient left: in bed;with call bell/phone within reach;with family/visitor present     Time: 1535-1555 PT Time Calculation (min) (ACUTE ONLY): 20 min  Charges:  $Gait Training: 8-22 mins                    G Codes:      Rica Koyanagi  PTA [facility]  Acute  Rehab Pager      [phone]

## 2015-09-27 NOTE — Consult Note (Signed)
[facility] for Infectious Disease      Reason for Consult: fever    Referring Physician: Dr. Grandville Silos  Principal Problem:   Fever Active Problems:   History of breast cancer   Hypertension   COPD (chronic obstructive pulmonary disease) (HCC)   Hypothyroidism   Atrial fibrillation (HCC)   Dehydration   . apixaban  5 mg Oral BID  . diltiazem  180 mg Oral Daily  . feeding supplement (ENSURE ENLIVE)  237 mL Oral BID BM  . furosemide  40 mg Oral Daily  . levothyroxine  137 mcg Oral QAC breakfast  . lidocaine  30 mL Infiltration Once  . potassium chloride SA  20 mEq Oral BID WC  . vitamin B-12  1,000 mcg Oral Daily    Recommendations: Supportive care NSAIDS or tylenol fluids   Assessment: She has fever, muscle aches, nausea, c/w influenza-like illness.     Antibiotics: None now, was on primaxin, vancomycin  HPI: Maria Washington is a 81 y.o. female with mild diastolic dysfunction from echo 2015, recent facial cellulitis, presented with fever.  Fever started last Saturday.  Associated with weakness and muscle aches diffusely.  No sick contacts, no travel.  Difficulty walking with weakness. Also has noted swelling of both feet over the last several weeks.  Also headache and nausea.   CXR independently reviewed and c/w edema, less likely viral etiology.    Review of Systems:  Constitutional: positive for malaise and anorexia Respiratory: positive for cough or mild and some clear productive sputum All other systems reviewed and are negative   Past Medical History  Diagnosis Date  . Hypertension   . Hypothyroid   . History of breast cancer   . Second degree heart block   . Heart murmur   . Cancer ([place])     left breast cA  . Arthritis   . COPD (chronic obstructive pulmonary disease) (HCC)     spot on lung  . Anemia     after giving birth  . Cardiac pacemaker in situ 12/05/2011    Social History  Substance Use Topics  . Smoking status: Former Smoker --  1.00 packs/day for 60 years    Types: Cigarettes    Quit date: 06/07/2013  . Smokeless tobacco: Never Used  . Alcohol Use: No    Family History  Problem Relation Age of Onset  . Cancer Father   . Heart attack Mother   . Cancer Brother    Allergies  Allergen Reactions  . Clindamycin/Lincomycin Rash  . Penicillins Rash    Has patient had a PCN reaction causing immediate rash, facial/tongue/throat swelling, SOB or lightheadedness with hypotension: no Has patient had a PCN reaction causing severe rash involving mucus membranes or skin necrosis: no Has patient had a PCN reaction that required hospitalization no Has patient had a PCN reaction occurring within the last 10 years: no If all of the above answers are "NO", then may proceed with Cephalosporin use.     OBJECTIVE: Blood pressure 136/63, pulse 71, temperature 98.2 F (36.8 C), temperature source Oral, resp. rate 18, height '4\' 9"'$  (1.448 m), weight 155 lb (70.308 kg), SpO2 98 %. General: awake, alert, nad Eyes: anicteric HENT: no thrush, no exudates Skin: some mild rash on face bilateral, no warmth Lungs: CTA B Cor: RRR Abdomen: soft, nt, nd Ext: 1+ bilateral non pitting edema in feet Neuro: non-focal  Lab Results  Component Value Date   WBC 6.8 09/25/2015  HGB 11.9* 09/25/2015   HCT 34.4* 09/25/2015   MCV 86.9 09/25/2015   PLT 100* 09/25/2015    Lab Results  Component Value Date   CREATININE 0.65 09/27/2015   BUN 8 09/27/2015   NA 140 09/27/2015   K 3.3* 09/27/2015   CL 110 09/27/2015   CO2 23 09/27/2015    Lab Results  Component Value Date   ALT 32 09/24/2015   AST 34 09/24/2015   ALKPHOS 71 09/24/2015     Microbiology: Recent Results (from the past 240 hour(s))  Culture, blood (routine x 2)     Status: None (Preliminary result)   Collection Time: 09/24/15  1:50 PM  Result Value Ref Range Status   Specimen Description BLOOD RIGHT ARM  Final   Special Requests BOTTLES DRAWN AEROBIC AND ANAEROBIC  5CC  Final   Culture   Final    NO GROWTH 3 DAYS Performed at [facility]    Report Status PENDING  Incomplete  Urine culture     Status: None   Collection Time: 09/24/15  2:15 PM  Result Value Ref Range Status   Specimen Description URINE, CLEAN CATCH  Final   Special Requests NONE  Final   Culture   Final    2,000 COLONIES/mL INSIGNIFICANT GROWTH Performed at [facility]    Report Status 09/25/2015 FINAL  Final  CSF culture with Stat gram stain     Status: None (Preliminary result)   Collection Time: 09/24/15  4:08 PM  Result Value Ref Range Status   Specimen Description CSF  Final   Special Requests NONE  Final   Gram Stain   Final    WBC PRESENT, PREDOMINANTLY MONONUCLEAR NO ORGANISMS SEEN Gram Stain Report Called to,Read Back By and Verified With: MARTIN,M AT 4462 ON [id number] BY HOOKER,B CYTOSPIN SMEAR    Culture   Final    NO GROWTH 2 DAYS Performed at [facility]    Report Status PENDING  Incomplete  Culture, blood (routine x 2)     Status: None (Preliminary result)   Collection Time: 09/24/15  4:15 PM  Result Value Ref Range Status   Specimen Description BLOOD RIGHT HAND  Final   Special Requests IN PEDIATRIC BOTTLE 5CC  Final   Culture   Final    NO GROWTH 3 DAYS Performed at [facility]    Report Status PENDING  Incomplete    Scharlene Gloss, [facility] [email or web address] [phone] pager  [phone] cell 09/27/2015, 3:32 PM

## 2015-09-28 DIAGNOSIS — R69 Illness, unspecified: Secondary | ICD-10-CM

## 2015-09-28 DIAGNOSIS — I480 Paroxysmal atrial fibrillation: Secondary | ICD-10-CM

## 2015-09-28 DIAGNOSIS — J111 Influenza due to unidentified influenza virus with other respiratory manifestations: Secondary | ICD-10-CM | POA: Diagnosis present

## 2015-09-28 LAB — BASIC METABOLIC PANEL
Anion gap: 6 (ref 5–15)
BUN: 9 mg/dL (ref 6–20)
CHLORIDE: 108 mmol/L (ref 101–111)
CO2: 28 mmol/L (ref 22–32)
CREATININE: 0.77 mg/dL (ref 0.44–1.00)
Calcium: 8.5 mg/dL — ABNORMAL LOW (ref 8.9–10.3)
GFR calc non Af Amer: >60 mL/min (ref >60)
GLUCOSE: 110 mg/dL — AB (ref 65–99)
Potassium: 3.6 mmol/L (ref 3.5–5.1)
Sodium: 142 mmol/L (ref 135–145)

## 2015-09-28 LAB — CSF CULTURE W GRAM STAIN: Culture: NO GROWTH

## 2015-09-28 LAB — CBC
HCT: 33.9 % — ABNORMAL LOW (ref 36.0–46.0)
HEMOGLOBIN: 11.4 g/dL — AB (ref 12.0–15.0)
MCH: 29.9 pg (ref 26.0–34.0)
MCHC: 33.6 g/dL (ref 30.0–36.0)
MCV: 89 fL (ref 78.0–100.0)
Platelets: 215 10*3/uL (ref 150–400)
RBC: 3.81 MIL/uL — ABNORMAL LOW (ref 3.87–5.11)
RDW: 15.2 % (ref 11.5–15.5)
WBC: 7.4 10*3/uL (ref 4.0–10.5)

## 2015-09-28 LAB — CSF CULTURE

## 2015-09-28 NOTE — Progress Notes (Signed)
Nutrition Follow-up  DOCUMENTATION CODES:   Obesity unspecified  INTERVENTION:  - Continue Ensure Enlive BID - RD will continue to monitor for needs  NUTRITION DIAGNOSIS:   Inadequate oral intake related to acute illness, nausea, poor appetite as evidenced by per patient/family report.  GOAL:   Patient will meet greater than or equal to 90% of their needs  MONITOR:   PO intake, Supplement acceptance, Weight trends, Labs, I & O's  REASON FOR ASSESSMENT:   Malnutrition Screening Tool  ASSESSMENT:   81 y.o. female hypertension, hypothyroidism, COPD, atrial fibrillation, chronic diastolic heart failure, Left breast cancer S/p lumpectomy, second degree heart block with PPM placement, diabetes mellitus, h/o vertigo and chronic neck pain, presents to ED with the above symptoms after being treated for facial cellulitis two weeks ago, . She reports some neck pain, which has been chronic over several months. She denies any other complaints. Her work up in ED shows a negative CXR and UA and a LP was done by EDP for evaluation of meningitis for her history of neck and fever. CSF analysis sent and blood cultures were drawn. Influenza PCR ordered. She was referred to medical service for evaluation of fever of unclear etiology.   10/20 Per chart review, pt consumed 100% lunch yesterday (10/19) and 100% breakfast this AM which pt reports was cereal and hot chocolate. She states that her appetite has been improving this week and she is eating more at meals than PTA. She denies any abdominal pain or nausea over the past 3 days. Pt has been drinking Ensure Enlive each day, including today. She feels that this supplement has helped her regain her strength and feel better overall.   Likely meeting minimal needs on average. Medications reviewed. Labs reviewed; Ca: 8.5 mg/dL.    10/17 - Pt states she was feeling week and decreased appetite PTA but that these areas have improved since admission.   - Daughter, who is at bedside, states that PTA pt was feeling nauseated with all intakes.  - Pt states that since Thursday (10/13) she has mainly been consuming liquids due to nausea. She states she continued this until admission and has slowly been trying more solid foods.  - Daughter states that pt drank Ensure Enlive this AM. Pt states she liked this supplement and it did not cause any discomfort for her. Encouraged consumption of supplement especially if she is unable to eat well. - No muscle or fat wasting present at this time. Unable to obtain weight hx at time of this visit. Chart review shows 20 lb weight loss (11.4% body weight) in the past 17 months which is not significant for time frame.   Diet Order:  Diet regular Room service appropriate?: Yes; Fluid consistency:: Thin  Skin:  Reviewed, no issues  Last BM:  10/18  Height:   Ht Readings from Last 1 Encounters:  09/24/15 '4\' 9"'$  (1.448 m)    Weight:   Wt Readings from Last 1 Encounters:  09/24/15 155 lb (70.308 kg)    Ideal Body Weight:  47.73 kg (kg)  BMI:  Body mass index is 33.53 kg/(m^2).  Estimated Nutritional Needs:   Kcal:  0626-9485  Protein:  55-65 grams  Fluid:  2 L/day  EDUCATION NEEDS:   No education needs identified at this time     Jarome Matin, RD, LDN Inpatient Clinical Dietitian Pager # [phone] After hours/weekend pager # [phone]

## 2015-09-28 NOTE — Progress Notes (Signed)
[facility] PROGRESS NOTE  ZAKAIYA LARES TSV:[id number] DOB: [birth year] DOA: 09/24/2015 PCP: Mathews Argyle, MD  Assessment/Plan: #1 fever/myalgias/chills Probable viral syndrome however patient still spiking fevers after antibiotics were discontinued. Patient with a MAXIMUM TEMPERATURE of 101.1 2 days ago. Patient currently afebrile. Patient with clinical improvement.  Influenza PCR is negative. Chest x-ray on admission was negative. Urinalysis was negative. CXR negative for PNA. Patient status post lumbar puncture on admission with a glucose of 64 total protein of 72 colorless, CSF culture and Gram stain with no growth to date. Urine cultures negative. Blood cultures pending. Consulted ID for further evaluation and management.  #2 hypokalemia Replete.  #3 COPD Stable.  #4 atrial fibrillation Continue Cardizem for rate control. Eliquis for anticoagulation.  #5 chronic diastolic heart failure Stable. Continue Lasix.  #6 hypertension Stable. Continue Cardizem.  #7 well-controlled diabetes mellitus Hemoglobin A1c 6.5. Sliding scale insulin.  #8 hypothyroidism TSH of 0.418. Continue home dose Synthroid.  #9 thrombocytopenia No active bleeding. Follow.  #10 prophylaxis SCDs for DVT prophylaxis.   Code Status: Full Family Communication: Updated patient and daughter at bedside. Disposition Plan: Home with HH, when medically stable, hopefully tomorrow.   Consultants:  ID: Dr Linus Salmons 09/27/2015  Procedures:  CT C-spine 09/24/2015  Chest x-ray 09/24/2015  Lumbar puncture per ED physician, DR Thomasene Lot 09/24/2015  Antibiotics:  IV Fortaz 09/24/2015>>>> 09/26/2015  IV acyclovir 09/24/2015>>>>> 09/26/2015  IV vancomycin 09/24/2015>>>>> 09/26/2015  HPI/Subjective: Patient states she's feeling better. No SOB. No CP.  Objective: Filed Vitals:   09/28/15 1020  BP: 123/54  Pulse: 72  Temp: 98 F (36.7 C)  Resp: 20    Intake/Output Summary (Last 24  hours) at 09/28/15 1309 Last data filed at 09/28/15 0934  Gross per 24 hour  Intake    240 ml  Output      0 ml  Net    240 ml   Filed Weights   09/24/15 1642 09/24/15 1732  Weight: 70.308 kg (155 lb) 70.308 kg (155 lb)    Exam:   General:  NAD  Cardiovascular: RRR  Respiratory: CTAB  Abdomen:  Soft, nontender, nondistended, positive bowel sounds.  Musculoskeletal: No clubbing cyanosis or edema.   Data Reviewed: Basic Metabolic Panel:  Recent Labs Lab 09/24/15 1350 09/25/15 0545 09/26/15 1050 09/27/15 0545 09/28/15 0523  NA 134* 135 136 140 142  K 3.1* 3.0* 3.0* 3.3* 3.6  CL 104 105 106 110 108  CO2 20* 21* '22 23 28  '$ GLUCOSE 201* 131* 123* 102* 110*  BUN '18 16 9 8 9  '$ CREATININE 1.01* 0.88 0.74 0.65 0.77  CALCIUM 8.7* 8.2* 8.5* 8.7* 8.5*  MG  --  1.8  --  1.8  --    Liver Function Tests:  Recent Labs Lab 09/24/15 1350  AST 34  ALT 32  ALKPHOS 71  BILITOT 0.8  PROT 6.8  ALBUMIN 3.3*   No results for input(s): LIPASE, AMYLASE in the last 168 hours. No results for input(s): AMMONIA in the last 168 hours. CBC:  Recent Labs Lab 09/24/15 1350 09/25/15 0545 09/28/15 0523  WBC 7.0 6.8 7.4  NEUTROABS 5.9  --   --   HGB 13.0 11.9* 11.4*  HCT 36.9 34.4* 33.9*  MCV 86.2 86.9 89.0  PLT 110* 100* 215   Cardiac Enzymes: No results for input(s): CKTOTAL, CKMB, CKMBINDEX, TROPONINI in the last 168 hours. BNP (last 3 results) No results for input(s): BNP in the last 8760 hours.  ProBNP (last 3 results) No  results for input(s): PROBNP in the last 8760 hours.  CBG: No results for input(s): GLUCAP in the last 168 hours.  Recent Results (from the past 240 hour(s))  Culture, blood (routine x 2)     Status: None (Preliminary result)   Collection Time: 09/24/15  1:50 PM  Result Value Ref Range Status   Specimen Description BLOOD RIGHT ARM  Final   Special Requests BOTTLES DRAWN AEROBIC AND ANAEROBIC 5CC  Final   Culture   Final    NO GROWTH 3  DAYS Performed at [facility]    Report Status PENDING  Incomplete  Urine culture     Status: None   Collection Time: 09/24/15  2:15 PM  Result Value Ref Range Status   Specimen Description URINE, CLEAN CATCH  Final   Special Requests NONE  Final   Culture   Final    2,000 COLONIES/mL INSIGNIFICANT GROWTH Performed at [facility]    Report Status 09/25/2015 FINAL  Final  CSF culture with Stat gram stain     Status: None   Collection Time: 09/24/15  4:08 PM  Result Value Ref Range Status   Specimen Description CSF  Final   Special Requests NONE  Final   Gram Stain   Final    WBC PRESENT, PREDOMINANTLY MONONUCLEAR NO ORGANISMS SEEN Gram Stain Report Called to,Read Back By and Verified With: MARTIN,M AT 1850 ON [id number] BY HOOKER,B CYTOSPIN SMEAR    Culture   Final    NO GROWTH 3 DAYS Performed at [facility]    Report Status 09/28/2015 FINAL  Final  Culture, blood (routine x 2)     Status: None (Preliminary result)   Collection Time: 09/24/15  4:15 PM  Result Value Ref Range Status   Specimen Description BLOOD RIGHT HAND  Final   Special Requests IN PEDIATRIC BOTTLE 5CC  Final   Culture   Final    NO GROWTH 3 DAYS Performed at [facility]    Report Status PENDING  Incomplete     Studies: Dg Chest 2 View  09/27/2015  CLINICAL DATA:  Fever last night. EXAM: CHEST  2 VIEW COMPARISON:  09/24/2015 FINDINGS: Left pacer remains in place, unchanged. Mild cardiomegaly with vascular congestion. Interstitial prominence could reflect interstitial edema. No confluent opacities or effusions. No acute bony abnormality. IMPRESSION: Diffuse interstitial prominence which could reflect interstitial edema. Electronically Signed   By: Rolm Baptise M.D.   On: 09/27/2015 14:50    Scheduled Meds: . apixaban  5 mg Oral BID  . diltiazem  180 mg Oral Daily  . feeding supplement (ENSURE ENLIVE)  237 mL Oral BID BM  . furosemide  40 mg Oral Daily  . levothyroxine   137 mcg Oral QAC breakfast  . lidocaine  30 mL Infiltration Once  . potassium chloride SA  20 mEq Oral BID WC  . vitamin B-12  1,000 mcg Oral Daily   Continuous Infusions:   Principal Problem:   Influenza-like illness Active Problems:   History of breast cancer   Hypertension   COPD (chronic obstructive pulmonary disease) (HCC)   Hypothyroidism   Atrial fibrillation (HCC)   Dehydration   Fever    Time spent: 74 minutes    THOMPSON,DANIEL M.D. [facility] Pager [phone]. If 7PM-7AM, please contact night-coverage at [email or web address], password [facility] 09/28/2015, 1:09 PM  LOS: 4 days

## 2015-09-28 NOTE — Care Management Note (Signed)
Case Management Note  Patient Details  Name: Maria Washington MRN: [id number] Date of Birth: October 06, 1929  Subjective/Objective:     Probable viral syndrome                Action/Plan: Discharge planning, spoke with patient at bedside. Discussed recommendations for [facility] services, patient is agreeable. Wants to use [facility] for [facility] services, contacted them for referral. Patient states she has RW but requests a 3-n-1. Asked about hospital bed, instructed her to discuss with attending to see if necessary.   Expected Discharge Date:                  Expected Discharge Plan:  [facility]  In-House Referral:  NA  Discharge planning Services  CM Consult  Post Acute Care Choice:  Home Health Choice offered to:  Patient  DME Arranged:  3-N-1 DME Agency:  [place]:  PT, OT [facility] Agency:  [place]  Status of Service:  Completed, signed off  Medicare Important Message Given:  Yes-second notification given Date Medicare IM Given:    Medicare IM give by:    Date Additional Medicare IM Given:    Additional Medicare Important Message give by:     If discussed at [place] of Stay Meetings, dates discussed:    Additional Comments:  Guadalupe Maple, RN 09/28/2015, 11:17 AM

## 2015-09-28 NOTE — Progress Notes (Signed)
Physical Therapy Treatment Patient Details Name: Maria Washington MRN: [id number] DOB: [birth year] Today's Date: 09/28/2015    History of Present Illness Maria Washington is a 81 y.o. female hypertension, hypothyroidism, COPD, atrial fibrillation, chronic diastolic heart failure, Left breast cancer S/p lumpectomy, second degree heart block with PPM placement, diabetes mellitus, h/o vertigo and chronic neck pain, presents to ED with fever, myalgia after being treated for facial cellulitis two weeks ago,  s/p lumbar puncture to r/o meningitis results pending, flu negative.     PT Comments    Pt feeling better.  Fever free.  Assisted OOB to amb to bathroom at [place] with RW.  Assisted with amb in hallway.  Good alternating gait.  No LOB.  Good safety cognition.   Pt plans to D/C to home tomorrow.   Follow Up Recommendations  Home health PT     Equipment Recommendations  None recommended by PT    Recommendations for Other Services       Precautions / Restrictions Precautions Precautions: Fall Precaution Comments: none Restrictions Weight Bearing Restrictions: No    Mobility  Bed Mobility Overal bed mobility: Modified Independent             General bed mobility comments: increased time and use of rail  Transfers Overall transfer level: Needs assistance Equipment used: Rolling walker (2 wheeled) Transfers: Sit to/from Stand Sit to Stand: Supervision;Min guard         General transfer comment: good safety cognition and use of hands  Ambulation/Gait Ambulation/Gait assistance: Supervision;Min guard Ambulation Distance (Feet): 80 Feet Assistive device: Rolling walker (2 wheeled) Gait Pattern/deviations: Step-through pattern Gait velocity: WFL   General Gait Details: tolerated increased distance.  Mild dyspnea.     Stairs            Wheelchair Mobility    Modified Rankin (Stroke Patients Only)       Balance                                    Cognition Arousal/Alertness: Awake/alert Behavior During Therapy: WFL for tasks assessed/performed Overall Cognitive Status: Within Functional Limits for tasks assessed                      Exercises      General Comments        Pertinent Vitals/Pain Pain Assessment: Faces Faces Pain Scale: Hurts little more Pain Location: stiff neck and bottom of L foot Pain Descriptors / Indicators: Tightness Pain Intervention(s): Monitored during session;Repositioned    Home Living                      Prior Function            PT Goals (current goals can now be found in the care plan section) Progress towards PT goals: Progressing toward goals    Frequency  Min 3X/week    PT Plan      Co-evaluation             End of Session Equipment Utilized During Treatment: Gait belt Activity Tolerance: Patient tolerated treatment well Patient left: in chair;with call bell/phone within reach;with family/visitor present     Time: 6761-9509 PT Time Calculation (min) (ACUTE ONLY): 18 min  Charges:  $Gait Training: 8-22 mins  G Codes:      Rica Koyanagi  PTA [facility]  Acute  Rehab Pager      [phone]

## 2015-09-29 DIAGNOSIS — R509 Fever, unspecified: Secondary | ICD-10-CM | POA: Insufficient documentation

## 2015-09-29 LAB — BASIC METABOLIC PANEL
ANION GAP: 9 (ref 5–15)
BUN: 10 mg/dL (ref 6–20)
CO2: 31 mmol/L (ref 22–32)
Calcium: 8.8 mg/dL — ABNORMAL LOW (ref 8.9–10.3)
Chloride: 99 mmol/L — ABNORMAL LOW (ref 101–111)
Creatinine, Ser: 0.68 mg/dL (ref 0.44–1.00)
GFR calc Af Amer: >60 mL/min (ref >60)
GFR calc non Af Amer: >60 mL/min (ref >60)
GLUCOSE: 127 mg/dL — AB (ref 65–99)
POTASSIUM: 3.6 mmol/L (ref 3.5–5.1)
Sodium: 139 mmol/L (ref 135–145)

## 2015-09-29 MED ORDER — TRAMADOL HCL 50 MG PO TABS: 50.0000 mg | ORAL_TABLET | Freq: Four times a day (QID) | ORAL | Status: DC | PRN

## 2015-09-29 NOTE — Discharge Summary (Signed)
Physician Discharge Summary  IRMALEE Washington EHM:[id number] DOB: [birth year] DOA: 09/24/2015  PCP: Mathews Argyle, MD  Admit date: 09/24/2015 Discharge date: 09/29/2015  Time spent: 60 minutes  Recommendations for Outpatient Follow-up:  1. Follow-up with Mathews Argyle, MD in 1 week. All follow-up patient need a basic metabolic profile done to follow-up on electrolytes and renal function.  Discharge Diagnoses:  Principal Problem:   Influenza-like illness Active Problems:   History of breast cancer   Hypertension   COPD (chronic obstructive pulmonary disease) (HCC)   Hypothyroidism   Atrial fibrillation (HCC)   Dehydration   Fever   Pyrexia   Discharge Condition: Stable and improved.  Diet recommendation: Heart healthy.  Filed Weights   09/24/15 1642 09/24/15 1732  Weight: 70.308 kg (155 lb) 70.308 kg (155 lb)    History of present illness:  Per Dr Marquis Maria Washington is a 81 y.o. female hypertension, hypothyroidism, COPD, atrial fibrillation, chronic diastolic heart failure, Left breast cancer S/p lumpectomy, second degree heart block with PPM placement, diabetes mellitus, h/o vertigo and chronic neck pain, presented to ED with the above symptoms after being treated for facial cellulitis two weeks ago, . She reported some neck pain, which has been chronic over several months. She denied any other complaints. Her work up in ED shows a negative CXR and UA and a LP was done by EDP for evaluation of meningitis for her history of neck and fever. CSF analysis sent and blood cultures were drawn. Influenza PCR ordered. She was referred to medical service for evaluation of fever of unclear etiology.  Of note she had a CTA of the head and neck last year , which did not show any high grade stenosis or any branch occlusion.    Hospital Course:  #1 influenza-like illness/( fever/myalgias/chills) Patient was admitted with a probable viral syndrome initially was placed  empirically on IV antibiotics of vancomycin and Fortaz and initially acyclovir as the was some concern for possible meningitis. Patient underwent a lumbar puncture to ED physicians on 09/24/2015 on day of admission which had a glucose of 64, total protein of 72, was colorless, CSF culture and Gram stain had no growth. IV antibiotics were subsequently discontinued. Probable viral syndrome however patient initially during the hospitalization was noted to still spiking fevers after discontinuation of antibiotics to a temp of 101.1. ID was consulted and assessed the patient and also felt patient likely had a viral syndrome. IV antibiotics were discontinued patient was monitored. Influenza PCR was obtained which came back negative. Chest x-ray done on admission was also negative for pneumonia. Urinalysis was negative. Patient was placed on supportive care. Patient improved clinically and remained afebrile for 24-48 hours and will be discharged home in stable and improved condition. Patient will follow-up with PCP as outpatient.  #2 hypokalemia Secondary to diureses. Patient's potassium was repleted and patient was resumed back on her home regimen of potassium supplementation.  #3 COPD Stable.  #4 atrial fibrillation Continued on home regimen of Cardizem for rate control. Eliquis for anticoagulation.  #5 chronic diastolic heart failure Stable. Patient's diuretics were initially held on admission but resumed during the hospitalization. Patient remained well compensated. Outpatient follow-up.   #6 hypertension Stable. Continued on home regimen of Cardizem.  #7 well-controlled diabetes mellitus Hemoglobin A1c 6.5. Patient was maintained on a sliding scale insulin during the hospitalization.   #8 hypothyroidism TSH of 0.418. Continued on home dose Synthroid.  #9 thrombocytopenia No active bleeding. Remained stable.   Procedures:  CT C-spine 09/24/2015  Chest x-ray 09/24/2015  Lumbar puncture  per ED physician, DR Thomasene Lot 09/24/2015    Consultations:  ID: Dr Linus Salmons 09/27/2015  Discharge Exam: Filed Vitals:   09/29/15 0522  BP: 123/63  Pulse: 76  Temp: 98 F (36.7 C)  Resp: 18    General: NAD Cardiovascular: RRR Respiratory: CTAB  Discharge Instructions   Discharge Instructions    Diet - low sodium heart healthy    Complete by:  As directed      Discharge instructions    Complete by:  As directed   Follow up with Mathews Argyle, MD in 1 week.     Increase activity slowly    Complete by:  As directed           Current Discharge Medication List    START taking these medications   Details  traMADol (ULTRAM) 50 MG tablet Take 1 tablet (50 mg total) by mouth every 6 (six) hours as needed for moderate pain. Qty: 20 tablet, Refills: 0      CONTINUE these medications which have NOT CHANGED   Details  acetaminophen (TYLENOL) 500 MG tablet Take 500-1,000 mg by mouth every 6 (six) hours as needed for mild pain or moderate pain.    alendronate (FOSAMAX) 70 MG tablet Take 70 mg by mouth every 7 (seven) days. Monday    apixaban (ELIQUIS) 5 MG TABS tablet Take 1 tablet (5 mg total) by mouth 2 (two) times daily. Qty: 60 tablet, Refills: 3    BIOTIN 5000 PO Take 5,000 mcg by mouth daily.     calcium-vitamin D (OSCAL WITH D) 500-200 MG-UNIT per tablet Take 1 tablet by mouth 2 (two) times daily.      Cholecalciferol (VITAMIN D PO) Take 1 capsule by mouth daily.    diltiazem (CARDIZEM CD) 180 MG 24 hr capsule Take 180 mg by mouth daily.    folic acid (FOLVITE) 1 MG tablet Take 1 mg by mouth daily.      furosemide (LASIX) 40 MG tablet Take 40 mg by mouth daily.     ibuprofen (ADVIL,MOTRIN) 400 MG tablet Take 1 tablet (400 mg total) by mouth every 6 (six) hours as needed. Qty: 30 tablet, Refills: 0    levothyroxine (SYNTHROID, LEVOTHROID) 137 MCG tablet Take 137 mcg by mouth daily before breakfast.    Omega-3 Fatty Acids (FISH OIL) 1200 MG CAPS Take  1,200 mg by mouth 2 (two) times daily.     potassium chloride SA (K-DUR,KLOR-CON) 20 MEQ tablet Take 20 mEq by mouth 2 (two) times daily.      vitamin B-12 (CYANOCOBALAMIN) 1000 MCG tablet Take 1,000 mcg by mouth daily.        STOP taking these medications     sulfamethoxazole-trimethoprim (BACTRIM DS,SEPTRA DS) 800-160 MG tablet        Allergies  Allergen Reactions  . Clindamycin/Lincomycin Rash  . Penicillins Rash    Has patient had a PCN reaction causing immediate rash, facial/tongue/throat swelling, SOB or lightheadedness with hypotension: no Has patient had a PCN reaction causing severe rash involving mucus membranes or skin necrosis: no Has patient had a PCN reaction that required hospitalization no Has patient had a PCN reaction occurring within the last 10 years: no If all of the above answers are "NO", then may proceed with Cephalosporin use.    Follow-up Information    Follow up with [facility].   Why:  physical therapy and occupationa therapy   Contact information:  [address] [place] [address] [phone]       Follow up with [facility].   Why:  3-n-1   Contact information:   [address] [place] [address] [phone]       Follow up with Mathews Argyle, MD. Schedule an appointment as soon as possible for a visit in 1 week.   Specialty:  Internal Medicine   Contact information:   [address] [place] [address] [phone]        The results of significant diagnostics from this hospitalization (including imaging, microbiology, ancillary and laboratory) are listed below for reference.    Significant Diagnostic Studies: Dg Chest 2 View  09/27/2015  CLINICAL DATA:  Fever last night. EXAM: CHEST  2 VIEW COMPARISON:  09/24/2015 FINDINGS: Left pacer remains in place, unchanged. Mild cardiomegaly with vascular congestion. Interstitial prominence could reflect  interstitial edema. No confluent opacities or effusions. No acute bony abnormality. IMPRESSION: Diffuse interstitial prominence which could reflect interstitial edema. Electronically Signed   By: Rolm Baptise M.D.   On: 09/27/2015 14:50   Dg Chest 2 View  09/24/2015  CLINICAL DATA:  Productive cough and nausea for 3 days. Chills. Anorexia. EXAM: CHEST  2 VIEW COMPARISON:  08/25/2014 FINDINGS: The heart size and mediastinal contours are within normal limits. Dual lead transvenous pacemaker remains in appropriate position. Both lungs are clear. No evidence of pneumothorax or pleural effusion. Surgical clips again seen in the left axilla. IMPRESSION: No active cardiopulmonary disease. Electronically Signed   By: Earle Gell M.D.   On: 09/24/2015 14:50   Ct Cervical Spine Wo Contrast  09/24/2015  CLINICAL DATA:  Patient fell last month. Patient is complaining of severe neck pain. History of breast carcinoma. EXAM: CT CERVICAL SPINE WITHOUT CONTRAST TECHNIQUE: Multidetector CT imaging of the cervical spine was performed without intravenous contrast. Multiplanar CT image reconstructions were also generated. COMPARISON:  None. FINDINGS: No fracture. No bone lesion. No spondylolisthesis. There is moderate loss disc height at C5-C6 and C6-C7. Small endplate osteophytes are noted from C4, C5, C6 and C7. Uncovertebral spurring leads to mild neural foraminal narrowing bilaterally at C5-C6 and C6-C7. Diffuse disc bulging leads to mild to moderate central stenosis at C5-C6. Soft tissues are unremarkable. Lung apices show minor scarring but are otherwise clear. IMPRESSION: 1. No fracture or acute finding. 2. Degenerative changes as described. Electronically Signed   By: Lajean Manes M.D.   On: 09/24/2015 20:59    Microbiology: Recent Results (from the past 240 hour(s))  Culture, blood (routine x 2)     Status: None (Preliminary result)   Collection Time: 09/24/15  1:50 PM  Result Value Ref Range Status   Specimen  Description BLOOD RIGHT ARM  Final   Special Requests BOTTLES DRAWN AEROBIC AND ANAEROBIC 5CC  Final   Culture   Final    NO GROWTH 4 DAYS Performed at [facility]    Report Status PENDING  Incomplete  Urine culture     Status: None   Collection Time: 09/24/15  2:15 PM  Result Value Ref Range Status   Specimen Description URINE, CLEAN CATCH  Final   Special Requests NONE  Final   Culture   Final    2,000 COLONIES/mL INSIGNIFICANT GROWTH Performed at [facility]    Report Status 09/25/2015 FINAL  Final  CSF culture with Stat gram stain     Status: None   Collection Time: 09/24/15  4:08 PM  Result Value Ref Range Status   Specimen Description CSF  Final   Special Requests NONE  Final   Gram Stain   Final    WBC PRESENT, PREDOMINANTLY MONONUCLEAR NO ORGANISMS SEEN Gram Stain Report Called to,Read Back By and Verified With: MARTIN,M AT 1850 ON [id number] BY HOOKER,B CYTOSPIN SMEAR    Culture   Final    NO GROWTH 3 DAYS Performed at [facility]    Report Status 09/28/2015 FINAL  Final  Culture, blood (routine x 2)     Status: None (Preliminary result)   Collection Time: 09/24/15  4:15 PM  Result Value Ref Range Status   Specimen Description BLOOD RIGHT HAND  Final   Special Requests IN PEDIATRIC BOTTLE 5CC  Final   Culture   Final    NO GROWTH 4 DAYS Performed at [facility]    Report Status PENDING  Incomplete     Labs: Basic Metabolic Panel:  Recent Labs Lab 09/25/15 0545 09/26/15 1050 09/27/15 0545 09/28/15 0523 09/29/15 0544  NA 135 136 140 142 139  K 3.0* 3.0* 3.3* 3.6 3.6  CL 105 106 110 108 99*  CO2 21* '22 23 28 31  '$ GLUCOSE 131* 123* 102* 110* 127*  BUN '16 9 8 9 10  '$ CREATININE 0.88 0.74 0.65 0.77 0.68  CALCIUM 8.2* 8.5* 8.7* 8.5* 8.8*  MG 1.8  --  1.8  --   --    Liver Function Tests:  Recent Labs Lab 09/24/15 1350  AST 34  ALT 32  ALKPHOS 71  BILITOT 0.8  PROT 6.8  ALBUMIN 3.3*   No results for input(s):  LIPASE, AMYLASE in the last 168 hours. No results for input(s): AMMONIA in the last 168 hours. CBC:  Recent Labs Lab 09/24/15 1350 09/25/15 0545 09/28/15 0523  WBC 7.0 6.8 7.4  NEUTROABS 5.9  --   --   HGB 13.0 11.9* 11.4*  HCT 36.9 34.4* 33.9*  MCV 86.2 86.9 89.0  PLT 110* 100* 215   Cardiac Enzymes: No results for input(s): CKTOTAL, CKMB, CKMBINDEX, TROPONINI in the last 168 hours. BNP: BNP (last 3 results) No results for input(s): BNP in the last 8760 hours.  ProBNP (last 3 results) No results for input(s): PROBNP in the last 8760 hours.  CBG: No results for input(s): GLUCAP in the last 168 hours.     SignedIrine Seal MD [facility] 09/29/2015, 9:47 AM

## 2015-09-29 NOTE — Progress Notes (Signed)
Pt given discharge instructions. Secretary not able to get in contact with her MD office to schedule appt and family is aware that they need to set up an appt in 1 week. No complaints at this time. Maria Washington W Avrianna Smart, RN

## 2015-09-30 LAB — CULTURE, BLOOD (ROUTINE X 2)
CULTURE: NO GROWTH
Culture: NO GROWTH

## 2015-10-17 ENCOUNTER — Ambulatory Visit
Admission: RE | Admit: 2015-10-17 | Discharge: 2015-10-17 | Disposition: A | Discharge status: Home / Self Care | Payer: Medicare Other | Source: Ambulatory Visit

## 2015-10-17 DIAGNOSIS — Z9889 Other specified postprocedural states: Secondary | ICD-10-CM

## 2015-10-17 DIAGNOSIS — Z853 Personal history of malignant neoplasm of breast: Secondary | ICD-10-CM

## 2015-10-17 DIAGNOSIS — Z1231 Encounter for screening mammogram for malignant neoplasm of breast: Secondary | ICD-10-CM

## 2016-08-09 ENCOUNTER — Emergency Department (HOSPITAL_COMMUNITY)
Admission: EM | Admit: 2016-08-09 | Discharge: 2016-08-09 | Disposition: A | Discharge status: Home / Self Care | Payer: Medicare Other | Attending: Emergency Medicine | Admitting: Emergency Medicine

## 2016-08-09 ENCOUNTER — Emergency Department (HOSPITAL_BASED_OUTPATIENT_CLINIC_OR_DEPARTMENT_OTHER): Admit: 2016-08-09 | Discharge: 2016-08-09 | Disposition: A | Discharge status: Home / Self Care | Payer: Medicare Other

## 2016-08-09 ENCOUNTER — Encounter (HOSPITAL_COMMUNITY): Payer: Self-pay | Admitting: Vascular Surgery

## 2016-08-09 DIAGNOSIS — Z87891 Personal history of nicotine dependence: Secondary | ICD-10-CM | POA: Diagnosis not present

## 2016-08-09 DIAGNOSIS — Z950 Presence of cardiac pacemaker: Secondary | ICD-10-CM | POA: Insufficient documentation

## 2016-08-09 DIAGNOSIS — J449 Chronic obstructive pulmonary disease, unspecified: Secondary | ICD-10-CM | POA: Diagnosis not present

## 2016-08-09 DIAGNOSIS — L03114 Cellulitis of left upper limb: Secondary | ICD-10-CM | POA: Insufficient documentation

## 2016-08-09 DIAGNOSIS — M79609 Pain in unspecified limb: Secondary | ICD-10-CM | POA: Diagnosis not present

## 2016-08-09 DIAGNOSIS — I5030 Unspecified diastolic (congestive) heart failure: Secondary | ICD-10-CM | POA: Diagnosis not present

## 2016-08-09 DIAGNOSIS — Z853 Personal history of malignant neoplasm of breast: Secondary | ICD-10-CM | POA: Diagnosis not present

## 2016-08-09 DIAGNOSIS — Z79899 Other long term (current) drug therapy: Secondary | ICD-10-CM | POA: Diagnosis not present

## 2016-08-09 DIAGNOSIS — Z7901 Long term (current) use of anticoagulants: Secondary | ICD-10-CM | POA: Insufficient documentation

## 2016-08-09 DIAGNOSIS — E039 Hypothyroidism, unspecified: Secondary | ICD-10-CM | POA: Insufficient documentation

## 2016-08-09 DIAGNOSIS — I110 Hypertensive heart disease with heart failure: Secondary | ICD-10-CM | POA: Insufficient documentation

## 2016-08-09 DIAGNOSIS — M7989 Other specified soft tissue disorders: Secondary | ICD-10-CM | POA: Diagnosis present

## 2016-08-09 MED ORDER — SULFAMETHOXAZOLE-TRIMETHOPRIM 800-160 MG PO TABS
1.0000 | ORAL_TABLET | Freq: Two times a day (BID) | ORAL | 0 refills | Status: AC
Start: 2016-08-09 — End: 2016-08-19

## 2016-08-09 MED ORDER — CEPHALEXIN 500 MG PO CAPS
500.0000 mg | ORAL_CAPSULE | Freq: Three times a day (TID) | ORAL | 0 refills | Status: AC
Start: 2016-08-09 — End: 2016-08-19

## 2016-08-09 NOTE — ED Triage Notes (Signed)
Pt reports to the ED for eval of left arm pain, erythema, and swelling. Pt had a lumpectomy in the affected arm 15 years ago. Denies any known injury or abrasion. Pt reports she has been having pain in her left elbow for some time but today they noticed the erythema and increased warmth.

## 2016-08-09 NOTE — ED Notes (Signed)
Pt declined discharge vitals stating "we just want to get home."

## 2016-08-09 NOTE — Progress Notes (Signed)
*  PRELIMINARY RESULTS* Vascular Ultrasound Left upper extremity venous duplex has been completed.  Preliminary findings: No evidence of DVT or superficial thrombosis.  Landry Mellow, RDMS, RVT  08/09/2016, 5:41 PM

## 2016-08-09 NOTE — ED Provider Notes (Signed)
[facility] DEPT Provider Note   CSN: [id number] Arrival date & time: 08/09/16  1626   History   Chief Complaint Chief Complaint  Patient presents with  . Arm Swelling    HPI Maria Washington is a 82 y.o. female.   The history is provided by the patient, a relative and medical records.   82 year old female with history of breast cancer status post left lumpectomy and lymph node axillary dissection, heart block with pacemaker on Eliquis, COPD, hypertension, hypothyroidism presenting with arm pain. Onset was yesterday. Worsening since then. Located in left forearm. Nonradiating, but has been spreading around the entire forearm. Moderate severity. Described as sharp. Associated with increased warmth, redness. Patient states she has had multiple staph infections with arm but has never needed drainage of any abscesses. There has been no history of injury. Has felt some low-grade fevers and fatigue today but otherwise no other complaints. No numbness or weakness in the arm.    Past Medical History:  Diagnosis Date  . Anemia    after giving birth  . Arthritis   . Cancer ([place])    left breast cA  . Cardiac pacemaker in situ 12/05/2011  . COPD (chronic obstructive pulmonary disease) ([facility])    spot on lung  . Heart murmur   . History of breast cancer   . Hypertension   . Hypothyroid   . Second degree heart block     Patient Active Problem List   Diagnosis Date Noted  . Pyrexia   . Influenza-like illness 09/28/2015  . Fever 09/24/2015  . Diastolic CHF ([place]) 44/02/4741  . Diarrhea 04/30/2014  . Acute respiratory failure with hypoxia ([place]) 04/29/2014  . Dehydration 04/29/2014  . Sepsis with hypotension ([place]) 04/28/2014  . CAP (community acquired pneumonia) 04/28/2014  . AKI (acute kidney injury) ([place]) 04/28/2014  . Pacemaker 01/13/2012  . Atrial fibrillation ([place]) 01/13/2012  . Cardiac pacemaker in situ 12/04/2011  . COPD (chronic obstructive pulmonary disease) ([place]) 12/02/2011    . History of breast cancer   . Hypertension   . Second degree heart block   . Hypothyroidism     Past Surgical History:  Procedure Laterality Date  . ABDOMINAL HYSTERECTOMY    . BREAST BIOPSY     lumpectomy  . carpel tunnel surgery     left wrist  . CHOLECYSTECTOMY    . FINGER SURGERY    . PERMANENT PACEMAKER INSERTION N/A 12/04/2011   Procedure: PERMANENT PACEMAKER INSERTION;  Surgeon: Evans Lance, MD;  Location: [facility] CATH LAB;  Service: Cardiovascular;  Laterality: N/A;  . SHOULDER SURGERY    . TONSILLECTOMY AND ADENOIDECTOMY      OB History    No data available       Home Medications    Prior to Admission medications   Medication Sig Start Date End Date Taking? Authorizing Provider  acetaminophen (TYLENOL) 500 MG tablet Take 500-1,000 mg by mouth every 6 (six) hours as needed for mild pain or moderate pain.   Yes Historical Provider, MD  alendronate (FOSAMAX) 70 MG tablet Take 70 mg by mouth every 7 (seven) days. Monday 03/21/14  Yes Historical Provider, MD  apixaban (ELIQUIS) 5 MG TABS tablet Take 1 tablet (5 mg total) by mouth 2 (two) times daily. 05/03/14  Yes Marianne L York, PA-C  BIOTIN 5000 PO Take 5,000 mcg by mouth daily.    Yes Historical Provider, MD  calcium-vitamin D (OSCAL WITH D) 500-200 MG-UNIT per tablet Take 1 tablet by mouth 2 (  two) times daily.     Yes Historical Provider, MD  Cholecalciferol (VITAMIN D PO) Take 1 capsule by mouth daily.   Yes Historical Provider, MD  diltiazem (CARDIZEM CD) 180 MG 24 hr capsule Take 180 mg by mouth daily.   Yes Historical Provider, MD  folic acid (FOLVITE) 1 MG tablet Take 1 mg by mouth daily.     Yes Historical Provider, MD  furosemide (LASIX) 40 MG tablet Take 40 mg by mouth daily.    Yes Historical Provider, MD  levothyroxine (SYNTHROID, LEVOTHROID) 137 MCG tablet Take 137 mcg by mouth daily before breakfast.   Yes Historical Provider, MD  Omega-3 Fatty Acids (FISH OIL) 1200 MG CAPS Take 1,200 mg by mouth 2 (two)  times daily.    Yes Historical Provider, MD  potassium chloride SA (K-DUR,KLOR-CON) 20 MEQ tablet Take 20 mEq by mouth 2 (two) times daily.     Yes Historical Provider, MD  vitamin B-12 (CYANOCOBALAMIN) 1000 MCG tablet Take 1,000 mcg by mouth daily.     Yes Historical Provider, MD  cephALEXin (KEFLEX) 500 MG capsule Take 1 capsule (500 mg total) by mouth 3 (three) times daily. 08/09/16 08/19/16  Leo Grosser, MD  sulfamethoxazole-trimethoprim (BACTRIM DS,SEPTRA DS) 800-160 MG tablet Take 1 tablet by mouth 2 (two) times daily. 08/09/16 08/19/16  Leo Grosser, MD    Family History Family History  Problem Relation Age of Onset  . Cancer Father   . Heart attack Mother   . Cancer Brother     Social History Social History  Substance Use Topics  . Smoking status: Former Smoker    Packs/day: 1.00    Years: 60.00    Types: Cigarettes    Quit date: 06/07/2013  . Smokeless tobacco: Never Used  . Alcohol use No     Allergies   Clindamycin/lincomycin and Penicillins   Review of Systems Review of Systems  Constitutional: Positive for fever.  Respiratory: Negative for cough and shortness of breath.   Cardiovascular: Negative for chest pain.  Gastrointestinal: Negative for abdominal pain.  Musculoskeletal: Positive for myalgias. Negative for arthralgias and joint swelling.  Skin: Positive for color change and rash.  Allergic/Immunologic: Negative for immunocompromised state.  Neurological: Negative for weakness and numbness.  Hematological: Bruises/bleeds easily.  All other systems reviewed and are negative.    Physical Exam Updated Vital Signs BP 140/57   Pulse 64   Temp 98.6 F (37 C) (Oral)   Resp 16   SpO2 99%   Physical Exam  Constitutional: She appears well-developed and well-nourished. No distress.  HENT:  Head: Normocephalic and atraumatic.  Eyes: Conjunctivae are normal.  Neck: Neck supple.  Cardiovascular: Normal rate, regular rhythm and intact distal pulses.     Pulmonary/Chest: Effort normal and breath sounds normal. No respiratory distress.  Abdominal: Soft. There is no tenderness.  Musculoskeletal: She exhibits no deformity.  Left forearm dry, somewhat scaled, diffusely erythematous with increased warmth throughout forearm, TTP. No focal area of fluctuance or induration. Distally NVI. No LAD in axillary area. No joint effusions in wrist, elbow  Neurological: She is alert.  Skin: Skin is warm and dry.  Psychiatric: She has a normal mood and affect.  Nursing note and vitals reviewed.    ED Treatments / Results  Labs (all labs ordered are listed, but only abnormal results are displayed) Labs Reviewed - No data to display  EKG  EKG Interpretation None       Radiology No results found.  Procedures Procedures (including  critical care time)  Medications Ordered in ED Medications - No data to display   Initial Impression / Assessment and Plan / ED Course  I have reviewed the triage vital signs and the nursing notes.  Pertinent labs & imaging results that were available during my care of the patient were reviewed by me and considered in my medical decision making (see chart for details).  Clinical Course    82 year old female with history of breast cancer status post left lumpectomy and lymph node axillary dissection, heart block with pacemaker on Eliquis, COPD, hypertension, hypothyroidism presenting with arm pain and redness as above. AF, VSS. No neurologic deficits. Duplex study of LUE negative for clots. Presentation c/w cellulitis. Do not have concern for drainable abscess at this time. Will tx with outpatient course of keflex, PCP f/u if not resolved. Dc in stable condition.   Case discussed with Dr. Laneta Simmers who oversaw management of this patient.   Final Clinical Impressions(s) / ED Diagnoses   Final diagnoses:  Cellulitis of left upper extremity    New Prescriptions Discharge Medication List as of 08/09/2016  8:46 PM     START taking these medications   Details  cephALEXin (KEFLEX) 500 MG capsule Take 1 capsule (500 mg total) by mouth 3 (three) times daily., Starting Fri 08/09/2016, Until Mon 08/19/2016, Print    sulfamethoxazole-trimethoprim (BACTRIM DS,SEPTRA DS) 800-160 MG tablet Take 1 tablet by mouth 2 (two) times daily., Starting Fri 08/09/2016, Until Mon 08/19/2016, Print         Ivin Booty, MD 08/10/16 2209    Leo Grosser, MD 08/10/16 [phone]

## 2016-09-20 ENCOUNTER — Other Ambulatory Visit: Payer: Self-pay | Admitting: Geriatric Medicine

## 2016-09-20 DIAGNOSIS — Z1231 Encounter for screening mammogram for malignant neoplasm of breast: Secondary | ICD-10-CM

## 2016-10-29 ENCOUNTER — Ambulatory Visit
Admission: RE | Admit: 2016-10-29 | Discharge: 2016-10-29 | Disposition: A | Discharge status: Home / Self Care | Payer: Medicare Other | Source: Ambulatory Visit | Attending: Geriatric Medicine | Admitting: Geriatric Medicine

## 2016-10-29 DIAGNOSIS — Z1231 Encounter for screening mammogram for malignant neoplasm of breast: Secondary | ICD-10-CM

## 2017-02-04 ENCOUNTER — Other Ambulatory Visit: Payer: Self-pay | Admitting: Geriatric Medicine

## 2017-02-04 ENCOUNTER — Ambulatory Visit
Admission: RE | Admit: 2017-02-04 | Discharge: 2017-02-04 | Disposition: A | Discharge status: Home / Self Care | Payer: Medicare Other | Source: Ambulatory Visit | Attending: Geriatric Medicine | Admitting: Geriatric Medicine

## 2017-02-04 DIAGNOSIS — R042 Hemoptysis: Secondary | ICD-10-CM

## 2017-02-05 ENCOUNTER — Other Ambulatory Visit: Payer: Self-pay | Admitting: Geriatric Medicine

## 2017-02-05 DIAGNOSIS — R918 Other nonspecific abnormal finding of lung field: Secondary | ICD-10-CM

## 2017-02-07 ENCOUNTER — Ambulatory Visit
Admission: RE | Admit: 2017-02-07 | Discharge: 2017-02-07 | Disposition: A | Discharge status: Home / Self Care | Payer: Medicare Other | Source: Ambulatory Visit | Attending: Geriatric Medicine | Admitting: Geriatric Medicine

## 2017-02-07 DIAGNOSIS — R918 Other nonspecific abnormal finding of lung field: Secondary | ICD-10-CM

## 2017-02-07 MED ORDER — IOPAMIDOL (ISOVUE-300) INJECTION 61%
75.0000 mL | Freq: Once | INTRAVENOUS | Status: AC | PRN
  Administered 2017-02-07: 75 mL via INTRAVENOUS

## 2017-02-10 ENCOUNTER — Telehealth: Payer: Self-pay | Admitting: *Deleted

## 2017-02-10 ENCOUNTER — Encounter: Payer: Self-pay | Admitting: Cardiology

## 2017-02-10 DIAGNOSIS — I700 Atherosclerosis of aorta: Secondary | ICD-10-CM

## 2017-02-10 DIAGNOSIS — Z7901 Long term (current) use of anticoagulants: Secondary | ICD-10-CM | POA: Insufficient documentation

## 2017-02-10 DIAGNOSIS — R918 Other nonspecific abnormal finding of lung field: Secondary | ICD-10-CM

## 2017-02-10 DIAGNOSIS — E669 Obesity, unspecified: Secondary | ICD-10-CM | POA: Insufficient documentation

## 2017-02-10 DIAGNOSIS — I2510 Atherosclerotic heart disease of native coronary artery without angina pectoris: Secondary | ICD-10-CM

## 2017-02-10 DIAGNOSIS — I480 Paroxysmal atrial fibrillation: Secondary | ICD-10-CM

## 2017-02-10 DIAGNOSIS — I340 Nonrheumatic mitral (valve) insufficiency: Secondary | ICD-10-CM

## 2017-02-10 HISTORY — DX: Atherosclerosis of aorta: I70.0

## 2017-02-10 NOTE — Telephone Encounter (Signed)
Oncology Nurse Navigator Documentation  Oncology Nurse Navigator Flowsheets 02/10/2017  Navigator Location [facility]  Referral date to RadOnc/MedOnc 02/10/2017  Navigator Encounter Type Telephone/Dr. Julien Nordmann received referral today on Maria Washington.  I called her and gave her an appt for [place] on 02/13/17.  She verbalized understanding of appt time and place. I will place [place] letter in the mail.   Telephone Outgoing Call  Treatment Phase Abnormal Scans  Barriers/Navigation Needs Coordination of Care  Interventions Coordination of Care  Coordination of Care Appts  Acuity Level 2  Acuity Level 2 Assistance expediting appointments  Time Spent with Patient 30

## 2017-02-10 NOTE — Progress Notes (Signed)
Maria Washington A  Date of visit:  02/10/2017 DOB:  04/26/1929    Age:  83 yrs. Medical record number:  [id number]     Account number:  [id number] Primary Care Provider: Felipa Eth, HAL T ____________________________ CURRENT DIAGNOSES  1. Paroxysmal atrial fibrillation  2. Dyspnea  3. Abnormal chest Xray  4. Long term (current) use of anticoagulants  5. Presence of cardiac pacemaker  6. Hyperlipidemia  7. Mitral valve regurgitation  8. Hypertensive heart disease without heart failure  9. Hypothyroidism  10. Obesity  11. Atrioventricular block, second degree ____________________________ ALLERGIES  Clindamycin, Rash  Penicillins, Intolerance-unknown ____________________________ MEDICATIONS  1. Vitamin B-12 1,000 mcg tablet, 1 p.o. daily  2. Calcium 600 + D(3) 600 mg(1,'500mg'$ ) -400 unit tablet, BID  3. Fish Oil 1,000 mg capsule, BID  4. Klor-Con M20 20 mEq tablet,ER particles/crystals, BID  5. Synthroid 137 mcg tablet, 1 p.o. daily  6. folic acid 1 mg tablet, 1 p.o. daily  7. Vitamin D3 2,000 unit tablet, 1 p.o. daily  8. fluticasone 50 mcg/actuation nasal spray,suspension, PRN  9. alendronate 70 mg tablet, weekly  10. Cartia XT 120 mg capsule,extended release, 1 p.o. daily  11. Eliquis 5 mg tablet, BID  12. furosemide 20 mg tablet, 1 p.o. daily  13. biotin 5 mg capsule, 10000 mcg p.o. daily ____________________________ CHIEF COMPLAINTS  Followup of Dyspnea ____________________________ HISTORY OF PRESENT ILLNESS Patient returns for cardiac followup. She had a chest x-ray after she left here that showed a lung mass and she had a CT scan that showed a large cavitary mass in the right lung with possible spread to the left side. She is set up to see the oncologist later on this week. She continues to have some dyspnea on exertion but feels fairly good. She has no angina. Incidentally the CT scan showed calcification in her aorta as well as her coronary arteries which is not surprising at  all. She has no angina. She denies PND, orthopnea or edema. She does have dyspnea with exertion. She has been taking a small dose of furosemide. ____________________________ PAST HISTORY  Past Medical Illnesses:  hypothyroidism, breast cancer treated with radiation, lumpectomy and chemo, COPD, hypertension;  Cardiovascular Illnesses:  Second degreee heart block, atrial fibrillation;  Surgical Procedures:  appendectomy, hysterectomy, pacemaker implant, trigger finger, bladder suspension, breast lumpectomy, tonsillectomy;  NYHA Classification:  II;  [place] Angina Classification:  Class 0: Asymptomatic;  Cardiology Procedures-Invasive:  [employer] pacemaker implant December 2012;  Cardiology Procedures-Noninvasive:  echocardiogram December 2012;  LVEF of 60% documented via echocardiogram on 12/03/2011,  CHADS Score:  2,  CHA2DS2-VASC Score:  4 ____________________________ CARDIO-PULMONARY TEST DATES EKG Date:  02/04/2017;  Echocardiography Date: 02/10/2017;  Chest Xray Date: 02/05/2017;  CT Scan Date:  02/05/2017   ____________________________ FAMILY HISTORY Brother -- Brother alive with problem, Cancer Father -- Father dead, Chronic obstructive lung disease Mother -- Mother dead, Congestive heart failure, CVA, Coronary Artery Disease Sister -- Sister alive and well, Hypertension, Hypothyroidism Sister -- Sister alive and well ____________________________ SOCIAL HISTORY Alcohol Use:  no alcohol use;  Smoking:  used to smoke but quit 2013, greater than 50 pack year history;  Diet:  regular diet;  Lifestyle:  widowed;  Exercise:  some exercise;  Occupation:  retired, Primary school teacher and raises dogs;  Residence:  lives alone;   ____________________________ REVIEW OF SYSTEMS General:  obesity, weight gain of approximately 5 lbs Eyes: wears eye glasses/contact lenses Respiratory: hemoptysis, dyspnea with exertion Cardiovascular:  please review HPI Abdominal: denies  dyspepsia, GI bleeding,  constipation, or diarrheaGenitourinary-Female: no dysuria, urgency, frequency, UTIs, or stress incontinence Musculoskeletal:  arthritis of the right leg, chronic low back pain Neurological:  vertigo  ____________________________ PHYSICAL EXAMINATION VITAL SIGNS  Blood Pressure:  160/80 Sitting, Right arm, regular cuff  , 150/76 Standing, Right arm and regular cuff   Pulse:  76/min. Weight:  167.00 lbs. Height:  60"BMI: 32  Constitutional:  pleasant white female, in no acute distress Skin:  warm and dry to touch, no apparent skin lesions, or masses noted. Head:  normocephalic, normal hair pattern, no masses or tenderness Neck:  supple, without massess. No JVD, thyromegaly or carotid bruits. Carotid upstroke normal. Chest:  normal symmetry, clear to auscultation, healed pacemaker incision in the left pectoral area Cardiac:  regular rhythm, normal S1 and S2, No S3 or S4, no murmurs, gallops or rubs detected. Peripheral Pulses:  the femoral,dorsalis pedis, and posterior tibial pulses are full and equal bilaterally with no bruits auscultated. Neurological:  no gross motor or sensory deficits noted, affect appropriate, oriented x3. ____________________________ MOST RECENT LIPID PANEL 07/16/13  CHOL TOTL 174 mg/dl, LDL 106 calc, HDL 40 mg/dl and CHOL/HDL 4.3 (Calc) ____________________________ IMPRESSIONS/PLAN  1. Dyspnea on exertion which is likely multifactorial. Her echocardiogram today shows concentric LVH with evidence of diastolic dysfunction with an EF of around 50%. She also has moderate mitral regurgitation and moderate tricuspid regurgitation but could also be associated with dyspnea. Her dyspnea is also possibly due to COPD and potentially the lung mass. 2. Hypertensive heart disease with diastolic dysfunction 3. Mitral regurgitation  Recommendations:  Echo reviewed with patient. At this point in time she has multiple reasons for dyspnea. She was in the DDD mode on her pacemaker was  programmed to rate response to allow tracking as her heart rate response was blunted. Hopefully this will help her dyspnea. I would also increase her furosemide to 40 mg daily to see if this would help with dyspnea. At her age of 48 with lung cancer she is really not a candidate for any interventional or invasive cardiac studies to deal with mitral regurgitation or CAD all of which should be treated medically. I will see her back in followup in 3 months. Outside CT scan personally reviewed. ____________________________ TODAYS ORDERS  1. Return Visit: 3 months                       ____________________________ Cardiology Physician:  Kerry Hough MD [facility]

## 2017-02-13 ENCOUNTER — Telehealth: Payer: Self-pay | Admitting: Internal Medicine

## 2017-02-13 ENCOUNTER — Ambulatory Visit: Payer: Medicare Other | Attending: Internal Medicine | Admitting: Physical Therapy

## 2017-02-13 ENCOUNTER — Encounter: Payer: Self-pay | Admitting: *Deleted

## 2017-02-13 ENCOUNTER — Institutional Professional Consult (permissible substitution): Payer: Medicare Other | Admitting: Thoracic Surgery (Cardiothoracic Vascular Surgery)

## 2017-02-13 ENCOUNTER — Other Ambulatory Visit: Payer: Medicare Other

## 2017-02-13 ENCOUNTER — Encounter: Payer: Self-pay | Admitting: Internal Medicine

## 2017-02-13 ENCOUNTER — Ambulatory Visit (HOSPITAL_BASED_OUTPATIENT_CLINIC_OR_DEPARTMENT_OTHER): Payer: Medicare Other | Admitting: Internal Medicine

## 2017-02-13 ENCOUNTER — Other Ambulatory Visit (HOSPITAL_BASED_OUTPATIENT_CLINIC_OR_DEPARTMENT_OTHER): Payer: Medicare Other

## 2017-02-13 VITALS — BP 157/80 | HR 87 | Temp 97.7°F | Resp 20 | Wt 163.7 lb

## 2017-02-13 DIAGNOSIS — J449 Chronic obstructive pulmonary disease, unspecified: Secondary | ICD-10-CM

## 2017-02-13 DIAGNOSIS — R262 Difficulty in walking, not elsewhere classified: Secondary | ICD-10-CM | POA: Diagnosis present

## 2017-02-13 DIAGNOSIS — E039 Hypothyroidism, unspecified: Secondary | ICD-10-CM | POA: Diagnosis not present

## 2017-02-13 DIAGNOSIS — R293 Abnormal posture: Secondary | ICD-10-CM | POA: Insufficient documentation

## 2017-02-13 DIAGNOSIS — I10 Essential (primary) hypertension: Secondary | ICD-10-CM

## 2017-02-13 DIAGNOSIS — R918 Other nonspecific abnormal finding of lung field: Secondary | ICD-10-CM

## 2017-02-13 DIAGNOSIS — I2510 Atherosclerotic heart disease of native coronary artery without angina pectoris: Secondary | ICD-10-CM | POA: Diagnosis not present

## 2017-02-13 LAB — CBC WITH DIFFERENTIAL/PLATELET
BASO%: 1.2 % (ref 0.0–2.0)
Basophils Absolute: 0.1 10*3/uL (ref 0.0–0.1)
EOS%: 3.3 % (ref 0.0–7.0)
Eosinophils Absolute: 0.2 10*3/uL (ref 0.0–0.5)
HEMATOCRIT: 40.3 % (ref 34.8–46.6)
HGB: 13.4 g/dL (ref 11.6–15.9)
LYMPH%: 29.4 % (ref 14.0–49.7)
MCH: 30 pg (ref 25.1–34.0)
MCHC: 33.3 g/dL (ref 31.5–36.0)
MCV: 90.2 fL (ref 79.5–101.0)
MONO#: 0.7 10*3/uL (ref 0.1–0.9)
MONO%: 10.9 % (ref 0.0–14.0)
NEUT%: 55.2 % (ref 38.4–76.8)
NEUTROS ABS: 3.6 10*3/uL (ref 1.5–6.5)
PLATELETS: 211 10*3/uL (ref 145–400)
RBC: 4.47 10*6/uL (ref 3.70–5.45)
RDW: 14.8 % — ABNORMAL HIGH (ref 11.2–14.5)
WBC: 6.6 10*3/uL (ref 3.9–10.3)
lymph#: 1.9 10*3/uL (ref 0.9–3.3)

## 2017-02-13 LAB — COMPREHENSIVE METABOLIC PANEL
ALT: 12 U/L (ref 0–55)
ANION GAP: 10 meq/L (ref 3–11)
AST: 13 U/L (ref 5–34)
Albumin: 3.8 g/dL (ref 3.5–5.0)
Alkaline Phosphatase: 79 U/L (ref 40–150)
BILIRUBIN TOTAL: 0.62 mg/dL (ref 0.20–1.20)
BUN: 15.2 mg/dL (ref 7.0–26.0)
CALCIUM: 10.2 mg/dL (ref 8.4–10.4)
CO2: 26 meq/L (ref 22–29)
CREATININE: 0.9 mg/dL (ref 0.6–1.1)
Chloride: 106 mEq/L (ref 98–109)
EGFR: 58 mL/min/{1.73_m2} — ABNORMAL LOW (ref >90)
Glucose: 109 mg/dl (ref 70–140)
Potassium: 3.4 mEq/L — ABNORMAL LOW (ref 3.5–5.1)
Sodium: 143 mEq/L (ref 136–145)
TOTAL PROTEIN: 7.8 g/dL (ref 6.4–8.3)

## 2017-02-13 NOTE — Progress Notes (Signed)
[place] Telephone:([phone]) [phone]   Fax:([phone]) [phone] Multidisciplinary thoracic oncology clinic  CONSULT NOTE  REFERRING PHYSICIAN: Dr. Lajean Manes  REASON FOR CONSULTATION:  83 years old white female with highly suspicious lung cancer.  HPI Maria Washington is a 83 y.o. female was past medical history significant for hypertension, hypothyroidism, heart valve disease, status post pacemaker placement, hypokalemia, history of breast cancer diagnosed in 2002 status post left lumpectomy followed by chemotherapy and radiation as well as hormonal therapy. She was followed at that time by Dr. Ralene Ok. The patient also has a long history of smoking but quit 40 years ago. She mentions that for the last few weeks she has been complaining of cough productive of blood-tinged sputum for 3 days. She was seen by her primary care physician and chest x-ray on 02/04/2017 showed right upper lobe mass was suspected AP window adenopathy highly concerning for neoplasm. This was followed by CT scan of the chest on 02/07/2017 and it showed 4.5 x 5.8 cm mass like opacity in the posterior right upper lobe with central necrosis and the appearance is suspicious for primary bronchogenic neoplasm. There was also additional 1.5 x 2.1 cm nodule in the anterior left upper lobe suspicious for metastasis. There was also thoracic lymphadenopathy including 2.8 cm short axis AP window node, 1.3 cm high right paratracheal node, 2.1 cm short axis prevascular node and 1.2 cm low right paratracheal node. Dr. Felipa Eth kindly referred the patient to me today for evaluation and recommendation regarding her condition. When seen today the patient is feeling fine except for the cough productive of yellowish sputum but no current hemoptysis. She denied having any chest pain or shortness breath except with exertion. She has no significant weight loss or night sweats. She has no nausea, vomiting, diarrhea or constipation. She  denied having any headache or visual changes. Family history significant for brother with bladder and lung cancers, mother had a stroke and father had COPD. The patient is a widow and has 7 children. She used to work as a Primary school teacher. She was accompanied today by her daughter Blanch Media and another daughter joined by phone. She has a history of smoking 1 pack per day for around 60 years and quit 40 years ago. She also drank alcohol on social basis and no history of drug abuse.  HPI  Past Medical History:  Diagnosis Date  . Aortic atherosclerosis ([place]) 02/10/2017  . Arthritis   . CAD (coronary artery disease), native coronary artery    3 vessel calcification noted on CT scan   . Cardiac pacemaker in situ 12/05/2011  . Chronic diastolic congestive heart failure ([place])   . COPD (chronic obstructive pulmonary disease) (HCC)    spot on lung  . History of breast cancer    Treated with lumpectomy, radiation, and chemo sees Dr. Ralene Ok   . Hypertensive heart disease without CHF   . Hypothyroid   . Mitral regurgitation   . Paroxysmal atrial fibrillation (HCC)    CHA2DS2VASC score 5  . Second degree heart block     Past Surgical History:  Procedure Laterality Date  . ABDOMINAL HYSTERECTOMY    . BLADDER SUSPENSION    . BREAST LUMPECTOMY     lumpectomy  . CARPAL TUNNEL RELEASE     left wrist  . CHOLECYSTECTOMY    . FINGER SURGERY    . PERMANENT PACEMAKER INSERTION N/A 12/04/2011   Procedure: PERMANENT PACEMAKER INSERTION;  Surgeon: Evans Lance, MD;  Location:  [facility] CATH LAB;  Service: Cardiovascular;  Laterality: N/A;  . SHOULDER SURGERY    . TONSILLECTOMY AND ADENOIDECTOMY      Family History  Problem Relation Age of Onset  . Cancer Father   . Heart attack Mother   . Cancer Brother     Social History Social History  Substance Use Topics  . Smoking status: Former Smoker    Packs/day: 1.00    Years: 60.00    Types: Cigarettes    Quit date: 06/07/2013  . Smokeless tobacco:  Never Used  . Alcohol use No    Allergies  Allergen Reactions  . Clindamycin/Lincomycin Rash  . Penicillins Rash    Has patient had a PCN reaction causing immediate rash, facial/tongue/throat swelling, SOB or lightheadedness with hypotension: no Has patient had a PCN reaction causing severe rash involving mucus membranes or skin necrosis: no Has patient had a PCN reaction that required hospitalization no Has patient had a PCN reaction occurring within the last 10 years: no If all of the above answers are "NO", then may proceed with Cephalosporin use.     Current Outpatient Prescriptions  Medication Sig Dispense Refill  . alendronate (FOSAMAX) 70 MG tablet Take 70 mg by mouth every 7 (seven) days. Monday    . apixaban (ELIQUIS) 5 MG TABS tablet Take 1 tablet (5 mg total) by mouth 2 (two) times daily. 60 tablet 3  . BIOTIN 5000 PO Take 5,000 mcg by mouth daily.     . calcium-vitamin D (OSCAL WITH D) 500-200 MG-UNIT per tablet Take 1 tablet by mouth 2 (two) times daily.      . Cholecalciferol (VITAMIN D PO) Take 1 capsule by mouth daily.    Marland Kitchen diltiazem (CARDIZEM CD) 180 MG 24 hr capsule Take 180 mg by mouth daily.    . folic acid (FOLVITE) 1 MG tablet Take 1 mg by mouth daily.      . furosemide (LASIX) 40 MG tablet Take 40 mg by mouth daily.     Marland Kitchen levothyroxine (SYNTHROID, LEVOTHROID) 137 MCG tablet Take 137 mcg by mouth daily before breakfast.    . Omega-3 Fatty Acids (FISH OIL) 1200 MG CAPS Take 1,200 mg by mouth 2 (two) times daily.     . potassium chloride SA (K-DUR,KLOR-CON) 20 MEQ tablet Take 20 mEq by mouth 2 (two) times daily.      . vitamin B-12 (CYANOCOBALAMIN) 1000 MCG tablet Take 1,000 mcg by mouth daily.      Marland Kitchen acetaminophen (TYLENOL) 500 MG tablet Take 500-1,000 mg by mouth every 6 (six) hours as needed for mild pain or moderate pain.    . traMADol (ULTRAM) 50 MG tablet Take 1 tablet by mouth every 12 (twelve) hours.     No current facility-administered medications for  this visit.     Review of Systems  Constitutional: positive for fatigue Eyes: negative Ears, nose, mouth, throat, and face: negative Respiratory: positive for cough, dyspnea on exertion and sputum Cardiovascular: negative Gastrointestinal: negative Genitourinary:negative Integument/breast: negative Hematologic/lymphatic: negative Musculoskeletal:positive for muscle weakness Neurological: negative Behavioral/Psych: negative Endocrine: negative Allergic/Immunologic: negative  Physical Exam  MEQ:ASTMH, healthy, no distress, well nourished and well developed SKIN: skin color, texture, turgor are normal, no rashes or significant lesions HEAD: Normocephalic, No masses, lesions, tenderness or abnormalities EYES: normal, PERRLA, Conjunctiva are pink and non-injected EARS: External ears normal, Canals clear OROPHARYNX:no exudate, no erythema and lips, buccal mucosa, and tongue normal  NECK: supple, no adenopathy, no JVD LYMPH:  no palpable lymphadenopathy,  no hepatosplenomegaly BREAST:not examined LUNGS: expiratory wheezes bilaterally HEART: regular rate & rhythm, no murmurs and no gallops ABDOMEN:abdomen soft, non-tender, normal bowel sounds and no masses or organomegaly BACK: Back symmetric, no curvature., No CVA tenderness EXTREMITIES:no joint deformities, effusion, or inflammation, no edema, no skin discoloration  NEURO: alert & oriented x 3 with fluent speech, no focal motor/sensory deficits  PERFORMANCE STATUS: ECOG 1  LABORATORY DATA: Lab Results  Component Value Date   WBC 6.6 02/13/2017   HGB 13.4 02/13/2017   HCT 40.3 02/13/2017   MCV 90.2 02/13/2017   PLT 211 02/13/2017      Chemistry      Component Value Date/Time   NA 143 02/13/2017 1347   K 3.4 (L) 02/13/2017 1347   CL 99 (L) 09/29/2015 0544   CO2 26 02/13/2017 1347   BUN 15.2 02/13/2017 1347   CREATININE 0.9 02/13/2017 1347      Component Value Date/Time   CALCIUM 10.2 02/13/2017 1347   ALKPHOS 79  02/13/2017 1347   AST 13 02/13/2017 1347   ALT 12 02/13/2017 1347   BILITOT 0.62 02/13/2017 1347       RADIOGRAPHIC STUDIES: Dg Chest 2 View  Result Date: 02/04/2017 CLINICAL DATA:  Hemoptysis, history hypertension, breast cancer, COPD EXAM: CHEST  2 VIEW COMPARISON:  09/27/2015 FINDINGS: LEFT subclavian transvenous pacemaker leads project at RIGHT atrium and RIGHT ventricle. Enlargement of cardiac silhouette. Pulmonary vascularity normal. Enlargement of AP window question adenopathy. RIGHT upper lobe mass posteriorly 5.0 x 4.8 x 4.5 cm highly worrisome for neoplasm, question primary versus metastatic. Remaining lungs clear. No pleural effusion or pneumothorax. Bones demineralized. Prior LEFT breast surgery and axillary node dissection. IMPRESSION: RIGHT upper lobe mass with suspect AP window adenopathy, highly concerning for neoplasm question primary versus metastatic in this patient with a history of breast cancer; CT chest with contrast recommended for further assessment. These results will be called to the ordering clinician or representative by the Radiologist Assistant, and communication documented in the PACS or zVision Dashboard. Electronically Signed   By: Lavonia Dana M.D.   On: 02/04/2017 17:26   Ct Chest W Contrast  Result Date: 02/07/2017 CLINICAL DATA:  Hemoptysis, shortness of breath, right lung mass on chest radiograph EXAM: CT CHEST WITH CONTRAST TECHNIQUE: Multidetector CT imaging of the chest was performed during intravenous contrast administration. CONTRAST:  46m ISOVUE-300 IOPAMIDOL (ISOVUE-300) INJECTION 61% COMPARISON:  Chest radiographs dated 02/04/2017 FINDINGS: Cardiovascular: Cardiomegaly.  No pericardial effusion. Three vessel coronary atherosclerosis. Atherosclerotic calcifications of the aortic arch. Left subclavian pacemaker. Mediastinum/Nodes: Thoracic lymphadenopathy, including: --2.8 cm short axis AP window node (series 3/ image 38) --1.3 cm short axis high right  paratracheal node (series 3/image 39) --2.1 cm short axis prevascular node (series 3/image 40) --1.2 cm short axis low right paratracheal node (series 3/image 43) Lungs/Pleura: 4.5 x 5.8 cm mass-like opacity in the posterior right upper lobe (series 4/ image 38), corresponding to the radiographic abnormality. Central necrosis (series 3/ image 38). This appearance is suspicious for primary bronchogenic neoplasm. Additional 1.5 x 2.1 cm nodule in the anterior left upper lobe (series 4/ image 29), suspicious for metastasis. Evaluation of the lung parenchyma is otherwise notable for motion degradation. Mild biapical pleural-parenchymal scarring. No pleural effusion or pneumothorax. Upper Abdomen: Visualized upper abdomen is notable for a dominant 5.2 cm left upper pole renal cyst (series 3/ image 115). Musculoskeletal: Degenerative changes of the visualized thoracolumbar spine. IMPRESSION: 5.8 cm posterior right upper lobe mass, suspicious for primary bronchogenic neoplasm.  Additional 2.1 cm anterior left upper lobe pulmonary nodule, suspicious for metastasis. Mediastinal lymphadenopathy, including a dominant 2.8 cm short axis AP window node, suspicious for nodal metastases. Electronically Signed   By: Julian Hy M.D.   On: 02/07/2017 14:55    ASSESSMENT: This is a very pleasant 83 years old white female with highly suspicious metastatic lung cancer but metastasis from other malignancy cannot be excluded at this point. She presented with large right upper lobe lung mass in addition to left upper lobe nodule and mediastinal lymphadenopathy.   PLAN: I had a lengthy discussion with the patient and her daughter today about her current disease condition and further investigation to confirm her diagnosis. I personally and independently reviewed the scan images and discuss the results and showed the images to the patient and her daughter. I recommended for the patient to complete the staging workup by ordering a  PET scan as well as CT scan of the head with and without contrast to rule out brain metastasis. The patient cannot get MRI of the brain because of the pacemaker. I also referred the patient to interventional radiology for consideration of CT-guided core biopsy of the right upper lobe lung mass for tissue diagnosis. If the final pathology is consistent with adenocarcinoma of the lung, we will send the tissue for molecular studies and PDL 1 expression by [facility]. I would see the patient back for follow-up visit in 2 weeks for reevaluation and more detailed discussion of her treatment options based on the final staging workup and tissue diagnosis. For the history of coronary heart disease, hypertension, hypothyroidism and COPD, she will continue her treatment under the care of Dr. Felipa Eth. The patient was seen during the multidisciplinary thoracic oncology clinic today by medical oncology, social worker and physical therapist. She was advised to call immediately if she has any concerning symptoms in the interval.  The patient voices understanding of current disease status and treatment options and is in agreement with the current care plan.  All questions were answered. The patient knows to call the clinic with any problems, questions or concerns. We can certainly see the patient much sooner if necessary.  Thank you so much for allowing me to participate in the care of [place]. I will continue to follow up the patient with you and assist in her care.  I spent 40 minutes counseling the patient face to face. The total time spent in the appointment was 60 minutes.  Disclaimer: This note was dictated with voice recognition software. Similar sounding words can inadvertently be transcribed and may not be corrected upon review.   Satin Boal K. February 13, 2017, 2:54 PM

## 2017-02-13 NOTE — Telephone Encounter (Signed)
Gave patient AVS and schedule for f/u with [facility] . PET and CT to be scheduled by [facility] as well as IR for CT biopsy. Patient aware.

## 2017-02-13 NOTE — Progress Notes (Signed)
[place] Clinical Social Work  Clinical Social Work met with patient/family and Futures trader at [facility] appointment to offer support and assess for psychosocial needs.  Medical oncologist reviewed patient's diagnosis and recommended more testing with patient/family.  Maria Washington was accompanied by her daughter, Maria Washington.  She has seven children, all live locally, and many grand/great grandchildren.  The patient shared her dogs bring her much joy; she breeds chihuahua's and has twenty chihauhua's living with her in her home.  The patient identified 0 distress and has no concerns at this time.  She shared her general outlook is to "be positive" and "trust in [name] plan".  She reported that has helped her deal with past diagnosis of breast cancer.  Clinical Social Work briefly discussed Clinical Social Work role and [employer] support programs/services.  Clinical Social Work encouraged patient to call with any additional questions or concerns.   Polo Riley, MSW, LCSW, OSW-C Clinical Social Worker [facility] [phone]

## 2017-02-13 NOTE — Therapy (Signed)
[place], [place], [address] Phone: [phone]   Fax:  [phone]  Physical Therapy Evaluation  Patient Details  Name: Maria Washington MRN: [id number] Date of Birth: 1929/06/09 Referring Provider: Dr. Curt Bears  Encounter Date: 02/13/2017      PT End of Session - 02/13/17 1602    Visit Number 1   Number of Visits 1   PT Start Time 7341   PT Stop Time 1540   PT Time Calculation (min) 25 min   Activity Tolerance Patient tolerated treatment well   Behavior During Therapy [place] for tasks assessed/performed      Past Medical History:  Diagnosis Date  . Aortic atherosclerosis ([place]) 02/10/2017  . Arthritis   . CAD (coronary artery disease), native coronary artery    3 vessel calcification noted on CT scan   . Cardiac pacemaker in situ 12/05/2011  . Chronic diastolic congestive heart failure ([place])   . COPD (chronic obstructive pulmonary disease) (HCC)    spot on lung  . History of breast cancer    Treated with lumpectomy, radiation, and chemo sees Dr. Ralene Ok   . Hypertensive heart disease without CHF   . Hypothyroid   . Mitral regurgitation   . Paroxysmal atrial fibrillation (HCC)    CHA2DS2VASC score 5  . Second degree heart block     Past Surgical History:  Procedure Laterality Date  . ABDOMINAL HYSTERECTOMY    . BLADDER SUSPENSION    . BREAST LUMPECTOMY     lumpectomy  . CARPAL TUNNEL RELEASE     left wrist  . CHOLECYSTECTOMY    . FINGER SURGERY    . PERMANENT PACEMAKER INSERTION N/A 12/04/2011   Procedure: PERMANENT PACEMAKER INSERTION;  Surgeon: Evans Lance, MD;  Location: [facility] CATH LAB;  Service: Cardiovascular;  Laterality: N/A;  . SHOULDER SURGERY    . TONSILLECTOMY AND ADENOIDECTOMY      There were no vitals filed for this visit.       Subjective Assessment - 02/13/17 1550    Subjective Reports some back problems, h/o breast cancer 16 years ago   Patient is  accompained by: Family member  daughter   Pertinent History Pt. presented with shortness of breath and hemoptysis. Workup showed right lung mass.  Has 5 cm. nodule right upper lobe and 1.5 cm. nodule left upper lobe.  She does not yet have tissue diagnosis, so expects to have biopsy with interventiona radiology and a PET scan to firm up the diagnosis.  Ex-smoker quit 2014 (60 pack-years); h/o breast cancer diagnoses 2002 with chemo, lumpectomy, XRT; cardiac history with pacemaker placed. h/o shoulder surgery, COPD.   Patient Stated Goals get info from all lung clinic providers   Currently in Pain? No/denies            [facility] PT Assessment - 02/13/17 0001      Assessment   Medical Diagnosis right and left lung masses without tissue diagnosis yet   Referring Provider Dr. Curt Bears   Onset Date/Surgical Date 02/04/17   Prior Therapy none     Precautions   Precautions Fall;Other (comment)   Precaution Comments cancer precautions     Restrictions   Weight Bearing Restrictions No     Balance Screen   Has the patient fallen in the past 6 months No   Has the patient had a decrease in activity level because of a fear of falling?  No  but describes herself as careful  Is the patient reluctant to leave their home because of a fear of falling?  No     Home Environment   Living Environment Private residence   Living Arrangements Children  one of her daughters   Type of [place] One level   Home Equipment --  does have a walker, but uses it little     Prior Function   Level of Independence Independent with basic ADLs;Independent with gait   Leisure has dogs that she looks after, but doesn't do regular exercise     Cognition   Overall Cognitive Status Within Functional Limits for tasks assessed     Observation/Other Assessments   Observations elderly woman accompanied by her daughter     Functional Tests   Functional tests Sit to Stand  initially slow to  stand; appears arthritic     Sit to Stand   Comments 12 times in 30 seconds, abovve average for her age     Posture/Postural Control   Posture/Postural Control Postural limitations   Postural Limitations Forward head;Rounded Shoulders;Increased thoracic kyphosis     ROM / Strength   AROM / PROM / Strength AROM     AROM   Overall AROM Comments trunk AROM in standing moderately limited:  flexion--reaches fingertips 12 inches to floor; extension 75% loss; sidebend 25% loss bilat., rotation 50% loss bilat.     Ambulation/Gait   Ambulation/Gait Yes   Ambulation/Gait Assistance 6: Modified independent (Device/Increase time)  hold onto grocery cart; has a walker and uses sometimes     Balance   Balance Assessed Yes     Dynamic Standing Balance   Dynamic Standing - Comments reaches forward 8 inches in standing, a little below average for her age                           PT Education - 02/13/17 1601    Education provided Yes   Education Details energy conservation, walking, Cure article on staying active, posture, breathing, PT info   Person(s) Educated Patient;Child(ren)   Methods Explanation;Handout   Comprehension Verbalized understanding               Lung Clinic Goals - 02/13/17 1606      Patient will be able to verbalize understanding of the benefit of exercise to decrease fatigue.   Status Achieved     Patient will be able to verbalize the importance of posture.   Status Achieved     Patient will be able to demonstrate diaphragmatic breathing for improved lung function.   Status Achieved     Patient will be able to verbalize understanding of the role of physical therapy to prevent functional decline and who to contact if physical therapy is needed.   Status Achieved             Plan - 02/13/17 1602    Clinical Impression Statement Pleasant older woman with apparent arthritis that causes her some back and hip pain, and is helped by  use of a walker at times.  She has a h/o lumpectomy for breast cancer with chemo and XRT.  Currently has a right and left lung mass with no tissue diagnosis, so will have biopsy and PET next. She has abdnormal posture, stiffness, decreased reach in standing, and uses a walker at times.  Eval today is moderate complexity with comorbidities of h/o breast cancer, arthritis, COPD.   Rehab Potential Good  PT Frequency One time visit   PT Treatment/Interventions Patient/family education   PT Next Visit Plan None at this time.  Pt. is still undergoing diagnostic workup; she may benefit from therapy for low back + hip pain and gait.   PT Home Exercise Plan walking, breathing exercise   Consulted and Agree with Plan of Care Patient      Patient will benefit from skilled therapeutic intervention in order to improve the following deficits and impairments:  Postural dysfunction, Decreased range of motion, Difficulty walking  Visit Diagnosis: Abnormal posture - Plan: PT plan of care cert/re-cert  Difficulty in walking, not elsewhere classified - Plan: PT plan of care cert/re-cert      G-Codes - 91/22/58 1607    Functional Assessment Tool Used (Outpatient Only) clinical judgement   Functional Limitation Changing and maintaining body position   Changing and Maintaining Body Position Current Status (T4621) At least 20 percent but less than 40 percent impaired, limited or restricted   Changing and Maintaining Body Position Goal Status (V4712) At least 20 percent but less than 40 percent impaired, limited or restricted   Changing and Maintaining Body Position Discharge Status (X2712) At least 20 percent but less than 40 percent impaired, limited or restricted       Problem List Patient Active Problem List   Diagnosis Date Noted  . Lung mass 02/10/2017  . Aortic atherosclerosis ([place]) 02/10/2017  . Obesity (BMI 30.0-34.9) 02/10/2017  . Long-term (current) use of anticoagulants   . Mitral  regurgitation   . CAD (coronary artery disease), native coronary artery   . Chronic diastolic congestive heart failure ([place])   . Paroxysmal atrial fibrillation (HCC)   . Cardiac pacemaker in situ 12/04/2011  . COPD (chronic obstructive pulmonary disease) ([place]) 12/02/2011  . History of breast cancer   . Hypertensive heart disease without CHF   . Hypothyroidism     Rakel Junio 02/13/2017, 4:09 PM  [place], [place], [address] Phone: [phone]   Fax:  [phone]  Name: Maria Washington MRN: [id number] Date of Birth: 21-Apr-1929  Serafina Royals, PT 02/13/17 4:09 PM

## 2017-02-24 ENCOUNTER — Ambulatory Visit (HOSPITAL_COMMUNITY)
Admission: RE | Admit: 2017-02-24 | Discharge: 2017-02-24 | Disposition: A | Discharge status: Home / Self Care | Payer: Medicare Other | Source: Ambulatory Visit | Attending: Internal Medicine | Admitting: Internal Medicine

## 2017-02-24 ENCOUNTER — Encounter (HOSPITAL_COMMUNITY): Payer: Self-pay

## 2017-02-24 DIAGNOSIS — G319 Degenerative disease of nervous system, unspecified: Secondary | ICD-10-CM | POA: Diagnosis not present

## 2017-02-24 DIAGNOSIS — R918 Other nonspecific abnormal finding of lung field: Secondary | ICD-10-CM

## 2017-02-24 DIAGNOSIS — R9082 White matter disease, unspecified: Secondary | ICD-10-CM | POA: Diagnosis not present

## 2017-02-24 MED ORDER — IOPAMIDOL (ISOVUE-300) INJECTION 61%
75.0000 mL | Freq: Once | INTRAVENOUS | Status: AC | PRN
  Administered 2017-02-24: 75 mL via INTRAVENOUS

## 2017-02-24 MED ORDER — IOPAMIDOL (ISOVUE-300) INJECTION 61%
INTRAVENOUS | Status: AC
  Filled 2017-02-24: qty 75

## 2017-02-26 ENCOUNTER — Encounter (HOSPITAL_COMMUNITY)
Admission: RE | Admit: 2017-02-26 | Discharge: 2017-02-26 | Disposition: A | Discharge status: Home / Self Care | Payer: Medicare Other | Source: Ambulatory Visit | Attending: Internal Medicine | Admitting: Internal Medicine

## 2017-02-26 ENCOUNTER — Other Ambulatory Visit: Payer: Self-pay | Admitting: Radiology

## 2017-02-26 ENCOUNTER — Other Ambulatory Visit: Payer: Self-pay | Admitting: General Surgery

## 2017-02-26 DIAGNOSIS — R918 Other nonspecific abnormal finding of lung field: Secondary | ICD-10-CM | POA: Insufficient documentation

## 2017-02-26 LAB — GLUCOSE, CAPILLARY: Glucose-Capillary: 142 mg/dL — ABNORMAL HIGH (ref 65–99)

## 2017-02-26 MED ORDER — FLUDEOXYGLUCOSE F - 18 (FDG) INJECTION: 8.0000 | Freq: Once | INTRAVENOUS | Status: DC | PRN

## 2017-02-27 ENCOUNTER — Encounter (HOSPITAL_COMMUNITY): Payer: Self-pay

## 2017-02-27 ENCOUNTER — Ambulatory Visit (HOSPITAL_COMMUNITY)
Admission: RE | Admit: 2017-02-27 | Discharge: 2017-02-27 | Disposition: A | Discharge status: Home / Self Care | Payer: Medicare Other | Source: Ambulatory Visit | Attending: Internal Medicine | Admitting: Internal Medicine

## 2017-02-27 ENCOUNTER — Ambulatory Visit (HOSPITAL_COMMUNITY)
Admission: RE | Admit: 2017-02-27 | Discharge: 2017-02-27 | Disposition: A | Discharge status: Home / Self Care | Payer: Medicare Other | Source: Ambulatory Visit | Attending: Interventional Radiology | Admitting: Interventional Radiology

## 2017-02-27 DIAGNOSIS — Z950 Presence of cardiac pacemaker: Secondary | ICD-10-CM | POA: Insufficient documentation

## 2017-02-27 DIAGNOSIS — Z853 Personal history of malignant neoplasm of breast: Secondary | ICD-10-CM | POA: Diagnosis not present

## 2017-02-27 DIAGNOSIS — R918 Other nonspecific abnormal finding of lung field: Secondary | ICD-10-CM

## 2017-02-27 DIAGNOSIS — Z809 Family history of malignant neoplasm, unspecified: Secondary | ICD-10-CM | POA: Diagnosis not present

## 2017-02-27 DIAGNOSIS — I5032 Chronic diastolic (congestive) heart failure: Secondary | ICD-10-CM | POA: Diagnosis not present

## 2017-02-27 DIAGNOSIS — Z8249 Family history of ischemic heart disease and other diseases of the circulatory system: Secondary | ICD-10-CM | POA: Insufficient documentation

## 2017-02-27 DIAGNOSIS — Z880 Allergy status to penicillin: Secondary | ICD-10-CM | POA: Insufficient documentation

## 2017-02-27 DIAGNOSIS — I441 Atrioventricular block, second degree: Secondary | ICD-10-CM | POA: Diagnosis not present

## 2017-02-27 DIAGNOSIS — Z7901 Long term (current) use of anticoagulants: Secondary | ICD-10-CM | POA: Diagnosis not present

## 2017-02-27 DIAGNOSIS — J449 Chronic obstructive pulmonary disease, unspecified: Secondary | ICD-10-CM | POA: Insufficient documentation

## 2017-02-27 DIAGNOSIS — I480 Paroxysmal atrial fibrillation: Secondary | ICD-10-CM | POA: Insufficient documentation

## 2017-02-27 DIAGNOSIS — Z7983 Long term (current) use of bisphosphonates: Secondary | ICD-10-CM | POA: Diagnosis not present

## 2017-02-27 DIAGNOSIS — I110 Hypertensive heart disease with heart failure: Secondary | ICD-10-CM | POA: Diagnosis not present

## 2017-02-27 DIAGNOSIS — E039 Hypothyroidism, unspecified: Secondary | ICD-10-CM | POA: Diagnosis not present

## 2017-02-27 DIAGNOSIS — M1990 Unspecified osteoarthritis, unspecified site: Secondary | ICD-10-CM | POA: Diagnosis not present

## 2017-02-27 DIAGNOSIS — Z79899 Other long term (current) drug therapy: Secondary | ICD-10-CM | POA: Insufficient documentation

## 2017-02-27 DIAGNOSIS — C3411 Malignant neoplasm of upper lobe, right bronchus or lung: Secondary | ICD-10-CM | POA: Diagnosis not present

## 2017-02-27 DIAGNOSIS — Z87891 Personal history of nicotine dependence: Secondary | ICD-10-CM | POA: Diagnosis not present

## 2017-02-27 DIAGNOSIS — I700 Atherosclerosis of aorta: Secondary | ICD-10-CM | POA: Insufficient documentation

## 2017-02-27 DIAGNOSIS — J95811 Postprocedural pneumothorax: Secondary | ICD-10-CM

## 2017-02-27 DIAGNOSIS — I2510 Atherosclerotic heart disease of native coronary artery without angina pectoris: Secondary | ICD-10-CM | POA: Insufficient documentation

## 2017-02-27 LAB — CBC
HCT: 38.9 % (ref 36.0–46.0)
Hemoglobin: 12.7 g/dL (ref 12.0–15.0)
MCH: 29.9 pg (ref 26.0–34.0)
MCHC: 32.6 g/dL (ref 30.0–36.0)
MCV: 91.5 fL (ref 78.0–100.0)
PLATELETS: 192 10*3/uL (ref 150–400)
RBC: 4.25 MIL/uL (ref 3.87–5.11)
RDW: 14.7 % (ref 11.5–15.5)
WBC: 6 10*3/uL (ref 4.0–10.5)

## 2017-02-27 LAB — PROTIME-INR
INR: 1.11
Prothrombin Time: 14.3 seconds (ref 11.4–15.2)

## 2017-02-27 LAB — APTT: APTT: 35 s (ref 24–36)

## 2017-02-27 MED ORDER — FENTANYL CITRATE (PF) 100 MCG/2ML IJ SOLN
INTRAMUSCULAR | Status: AC | PRN
  Administered 2017-02-27 (×2): 25 ug via INTRAVENOUS

## 2017-02-27 MED ORDER — MIDAZOLAM HCL 2 MG/2ML IJ SOLN
INTRAMUSCULAR | Status: AC | PRN
  Administered 2017-02-27 (×2): 0.5 mg via INTRAVENOUS

## 2017-02-27 MED ORDER — FENTANYL CITRATE (PF) 100 MCG/2ML IJ SOLN
INTRAMUSCULAR | Status: DC
Start: 2017-02-27 — End: 2017-02-28
  Filled 2017-02-27: qty 4

## 2017-02-27 MED ORDER — LIDOCAINE HCL 1 % IJ SOLN
INTRAMUSCULAR | Status: AC
  Filled 2017-02-27: qty 20

## 2017-02-27 MED ORDER — SODIUM CHLORIDE 0.9 % IV SOLN: INTRAVENOUS | Status: DC

## 2017-02-27 MED ORDER — MIDAZOLAM HCL 2 MG/2ML IJ SOLN
INTRAMUSCULAR | Status: AC
  Filled 2017-02-27: qty 4

## 2017-02-27 NOTE — Discharge Instructions (Signed)
Needle Biopsy of the Lung, Care After °This sheet gives you information about how to care for yourself after your procedure. Your health care provider may also give you more specific instructions. If you have problems or questions, contact your health care provider. °What can I expect after the procedure? °After the procedure, it is common to have: °· Soreness, pain, and tenderness where a tissue sample was taken (biopsy site). °· A cough. °· A sore throat. ° °Follow these instructions at home: °Biopsy site care °· Follow instructions from your health care provider about when to remove the bandage that was placed on the biopsy site. °· Keep the bandage dry until it has been removed. °· Check your biopsy site every day for signs of infection. Check for: °? More redness, swelling, or pain. °? More fluid or blood. °? Warmth to the touch. °? Pus or a bad smell. °General instructions °· Rest as directed by your health care provider. Ask your health care provider what activities are safe for you. °· Do not take baths, swim, or use a hot tub until your health care provider approves. °· Take over-the-counter and prescription medicines only as told by your health care provider. °· If you have airplane travel scheduled, talk with your health care provider about when it is safe for you to travel by airplane. °· It is up to you to get the results of your procedure. Ask your health care provider, or the department that is doing the procedure, when your results will be ready. °· Keep all follow-up visits as told by your health care provider. This is important. °Contact a health care provider if: °· You have more redness, swelling, or pain around your biopsy site. °· You have more fluid or blood coming from your biopsy site. °· Your biopsy site feels warm to the touch. °· You have pus or a bad smell coming from your biopsy site. °· You have a fever. °· You have pain that does not get better with medicine. °Get help right away  if: °· You have problems breathing. °· You have chest pain. °· You cough up blood. °· You faint. °· You have a fast heart rate. °Summary °· After a needle biopsy of the lung, it is common to have a cough, a sore throat, or soreness, pain, and tenderness where a tissue sample was taken (biopsy site). °· You should check your biopsy area every day for signs of infection, including pus or a bad smell, warmth, more fluid or blood, or more redness, swelling, or pain. °· You should not take baths, swim, or use a hot tub until your health care provider approves. °· It is up to you to get the results of your procedure. Ask your health care provider, or the department that is doing the procedure, when your results will be ready. °This information is not intended to replace advice given to you by your health care provider. Make sure you discuss any questions you have with your health care provider. °Document Released: 09/22/2007 Document Revised: 10/16/2016 Document Reviewed: 10/16/2016 °Elsevier Interactive Patient Education © 2017 [employer] ° ° °

## 2017-02-27 NOTE — Sedation Documentation (Signed)
bandaid R midline back

## 2017-02-27 NOTE — Sedation Documentation (Signed)
Patient is resting comfortably. 

## 2017-02-27 NOTE — H&P (Signed)
Chief Complaint: Patient was seen in consultation today for right lung lesion biopsy at the request of [facility]  Referring Physician(s): Mohamed,Mohamed  Supervising Physician: Corrie Mckusick  Patient Status: [facility] - Out-pt  History of Present Illness: Maria Washington is a 83 y.o. female   Hx Breast Ca  Cough and hemoptysis early March 2018 Work up revealed Lung masses and LAN PET 3/21: IMPRESSION: Hypermetabolic 5.1 cm posterior right upper lobe mass and 2.3 cm left upper lobe pulmonary nodule, consistent with synchronous primary bronchogenic carcinomas. Hypermetabolic bilateral mediastinal and right hilar lymphadenopathy, consistent with metastatic disease. No evidence of metastatic disease within the neck, abdomen, or pelvis.  Now scheduled for right lung mass biopsy Off Eliquis over 1 week at this point  Past Medical History:  Diagnosis Date  . Aortic atherosclerosis ([place]) 02/10/2017  . Arthritis   . CAD (coronary artery disease), native coronary artery    3 vessel calcification noted on CT scan   . Cardiac pacemaker in situ 12/05/2011  . Chronic diastolic congestive heart failure ([place])   . COPD (chronic obstructive pulmonary disease) ([facility])    spot on lung  . History of breast cancer    Treated with lumpectomy, radiation, and chemo sees Dr. Ralene Ok   . Hypertensive heart disease without CHF   . Hypothyroid   . Mitral regurgitation   . Paroxysmal atrial fibrillation ([facility])    CHA2DS2VASC score 5  . Second degree heart block     Past Surgical History:  Procedure Laterality Date  . ABDOMINAL HYSTERECTOMY    . BLADDER SUSPENSION    . BREAST LUMPECTOMY     lumpectomy  . CARPAL TUNNEL RELEASE     left wrist  . CHOLECYSTECTOMY    . FINGER SURGERY    . PERMANENT PACEMAKER INSERTION N/A 12/04/2011   Procedure: PERMANENT PACEMAKER INSERTION;  Surgeon: Evans Lance, MD;  Location: [facility] CATH LAB;  Service: Cardiovascular;  Laterality: N/A;  . SHOULDER  SURGERY    . TONSILLECTOMY AND ADENOIDECTOMY      Allergies: Clindamycin/lincomycin and Penicillins  Medications: Prior to Admission medications   Medication Sig Start Date End Date Taking? Authorizing Provider  alendronate (FOSAMAX) 70 MG tablet Take 70 mg by mouth every 7 (seven) days. Monday 03/21/14  Yes Historical Provider, MD  BIOTIN 5000 PO Take 5,000 mcg by mouth daily.    Yes Historical Provider, MD  calcium-vitamin D (OSCAL WITH D) 500-200 MG-UNIT per tablet Take 1 tablet by mouth 2 (two) times daily.     Yes Historical Provider, MD  cholecalciferol (VITAMIN D) 1000 units tablet Take 1 capsule by mouth daily.   Yes Historical Provider, MD  diltiazem (CARDIZEM CD) 180 MG 24 hr capsule Take 180 mg by mouth daily.   Yes Historical Provider, MD  folic acid (FOLVITE) 1 MG tablet Take 1 mg by mouth daily.     Yes Historical Provider, MD  furosemide (LASIX) 40 MG tablet Take 40 mg by mouth daily.    Yes Historical Provider, MD  levothyroxine (SYNTHROID, LEVOTHROID) 137 MCG tablet Take 137 mcg by mouth daily before breakfast.   Yes Historical Provider, MD  Omega-3 Fatty Acids (FISH OIL) 1200 MG CAPS Take 1,200 mg by mouth 2 (two) times daily.    Yes Historical Provider, MD  potassium chloride SA (K-DUR,KLOR-CON) 20 MEQ tablet Take 20-40 mEq by mouth See admin instructions. 2 tabs in the morning, and 1 tab in the evening   Yes Historical Provider, MD  vitamin  B-12 (CYANOCOBALAMIN) 1000 MCG tablet Take 1,000 mcg by mouth daily.     Yes Historical Provider, MD  apixaban (ELIQUIS) 5 MG TABS tablet Take 1 tablet (5 mg total) by mouth 2 (two) times daily. 05/03/14   Melton Alar, PA-C     Family History  Problem Relation Age of Onset  . Cancer Father   . Heart attack Mother   . Cancer Brother     Social History   Social History  . Marital status: Widowed    Spouse name: N/A  . Number of children: N/A  . Years of education: N/A   Social History Main Topics  . Smoking status:  Former Smoker    Packs/day: 1.00    Years: 60.00    Types: Cigarettes    Quit date: 06/07/2013  . Smokeless tobacco: Never Used  . Alcohol use No  . Drug use: No  . Sexual activity: Not Currently   Other Topics Concern  . None   Social History Narrative   Widow.  Has 22 dogs.    Review of Systems: A 12 point ROS discussed and pertinent positives are indicated in the HPI above.  All other systems are negative.  Review of Systems  Constitutional: Negative for activity change, fatigue and fever.  Respiratory: Positive for cough. Negative for shortness of breath.   Cardiovascular: Negative for chest pain.  Gastrointestinal: Negative for abdominal pain.  Neurological: Positive for weakness.  Psychiatric/Behavioral: Negative for behavioral problems and confusion.    Vital Signs: BP (!) 152/78   Pulse 66   Temp 97.8 F (36.6 C)   Resp 18   Ht 5' (1.524 m)   Wt 160 lb (72.6 kg)   SpO2 96%   BMI 31.25 kg/m   Physical Exam  Constitutional: She is oriented to person, place, and time.  Cardiovascular: Normal rate, regular rhythm and normal heart sounds.   Pulmonary/Chest: Effort normal and breath sounds normal. She has no wheezes.  Abdominal: Soft. Bowel sounds are normal. There is no tenderness.  Musculoskeletal: Normal range of motion.  Neurological: She is alert and oriented to person, place, and time.  Skin: Skin is warm and dry.  Psychiatric: She has a normal mood and affect. Her behavior is normal. Judgment and thought content normal.  Nursing note and vitals reviewed.   Mallampati Score:  MD Evaluation Airway: WNL Heart: WNL Abdomen: WNL Chest/ Lungs: WNL ASA  Classification: 3 Mallampati/Airway Score: One  Imaging: Dg Chest 2 View  Result Date: 02/04/2017 CLINICAL DATA:  Hemoptysis, history hypertension, breast cancer, COPD EXAM: CHEST  2 VIEW COMPARISON:  09/27/2015 FINDINGS: LEFT subclavian transvenous pacemaker leads project at RIGHT atrium and RIGHT  ventricle. Enlargement of cardiac silhouette. Pulmonary vascularity normal. Enlargement of AP window question adenopathy. RIGHT upper lobe mass posteriorly 5.0 x 4.8 x 4.5 cm highly worrisome for neoplasm, question primary versus metastatic. Remaining lungs clear. No pleural effusion or pneumothorax. Bones demineralized. Prior LEFT breast surgery and axillary node dissection. IMPRESSION: RIGHT upper lobe mass with suspect AP window adenopathy, highly concerning for neoplasm question primary versus metastatic in this patient with a history of breast cancer; CT chest with contrast recommended for further assessment. These results will be called to the ordering clinician or representative by the Radiologist Assistant, and communication documented in the PACS or zVision Dashboard. Electronically Signed   By: Lavonia Dana M.D.   On: 02/04/2017 17:26   Ct Head W Wo Contrast  Result Date: 02/24/2017 CLINICAL DATA:  New  diagnosis lung cancer.  Staging. EXAM: CT HEAD WITHOUT AND WITH CONTRAST TECHNIQUE: Contiguous axial images were obtained from the base of the skull through the vertex without and with intravenous contrast CONTRAST:  61m ISOVUE-300 IOPAMIDOL (ISOVUE-300) INJECTION 61% COMPARISON:  05/14/2014 FINDINGS: Brain: Mild age related atrophy. Mild chronic small-vessel ischemic changes of the white matter. No sign of acute infarction, mass lesion, hemorrhage, hydrocephalus or extra-axial collection. Vascular: There is atherosclerotic calcification of the major vessels at the base of the brain. Skull: Normal Sinuses/Orbits: Clear/normal Other: None significant IMPRESSION: No evidence of metastatic disease. Atrophy and chronic small-vessel white matter disease. Electronically Signed   By: MNelson ChimesM.D.   On: 02/24/2017 16:20   Ct Chest W Contrast  Result Date: 02/07/2017 CLINICAL DATA:  Hemoptysis, shortness of breath, right lung mass on chest radiograph EXAM: CT CHEST WITH CONTRAST TECHNIQUE: Multidetector  CT imaging of the chest was performed during intravenous contrast administration. CONTRAST:  763mISOVUE-300 IOPAMIDOL (ISOVUE-300) INJECTION 61% COMPARISON:  Chest radiographs dated 02/04/2017 FINDINGS: Cardiovascular: Cardiomegaly.  No pericardial effusion. Three vessel coronary atherosclerosis. Atherosclerotic calcifications of the aortic arch. Left subclavian pacemaker. Mediastinum/Nodes: Thoracic lymphadenopathy, including: --2.8 cm short axis AP window node (series 3/ image 38) --1.3 cm short axis high right paratracheal node (series 3/image 39) --2.1 cm short axis prevascular node (series 3/image 40) --1.2 cm short axis low right paratracheal node (series 3/image 43) Lungs/Pleura: 4.5 x 5.8 cm mass-like opacity in the posterior right upper lobe (series 4/ image 38), corresponding to the radiographic abnormality. Central necrosis (series 3/ image 38). This appearance is suspicious for primary bronchogenic neoplasm. Additional 1.5 x 2.1 cm nodule in the anterior left upper lobe (series 4/ image 29), suspicious for metastasis. Evaluation of the lung parenchyma is otherwise notable for motion degradation. Mild biapical pleural-parenchymal scarring. No pleural effusion or pneumothorax. Upper Abdomen: Visualized upper abdomen is notable for a dominant 5.2 cm left upper pole renal cyst (series 3/ image 115). Musculoskeletal: Degenerative changes of the visualized thoracolumbar spine. IMPRESSION: 5.8 cm posterior right upper lobe mass, suspicious for primary bronchogenic neoplasm. Additional 2.1 cm anterior left upper lobe pulmonary nodule, suspicious for metastasis. Mediastinal lymphadenopathy, including a dominant 2.8 cm short axis AP window node, suspicious for nodal metastases. Electronically Signed   By: SrJulian Hy.D.   On: 02/07/2017 14:55   Nm Pet Image Initial (pi) Skull Base To Thigh  Result Date: 02/26/2017 CLINICAL DATA:  Initial treatment strategy for right upper lobe lung mass. EXAM: NUCLEAR  MEDICINE PET SKULL BASE TO THIGH TECHNIQUE: 8.0 mCi F-18 FDG was injected intravenously. Full-ring PET imaging was performed from the skull base to thigh after the radiotracer. CT data was obtained and used for attenuation correction and anatomic localization. FASTING BLOOD GLUCOSE:  Value: 142 mg/dl COMPARISON:  Chest CT on 02/07/2017 FINDINGS: NECK No hypermetabolic lymph nodes in the neck. CHEST 5.1 cm mass in the posterior right upper lobe abutting the major fissure is hypermetabolic, with SUV max of 28.6. 2.3 cm pulmonary nodule in the central left upper lobe is also hypermetabolic, with SUV max of 14.9. Hypermetabolic mediastinal lymphadenopathy is seen in the lateral aortic region, with largest area measuring 3.0 cm in short axis on image 64/4, with SUV max of 13.7. A 10 mm right paratracheal lymph node on image 64/4 shows FDG uptake, with SUV max of 3.5. Asymmetric hypermetabolic activity is also seen within the right hilum, with SUV max of 5.1. ABDOMEN/PELVIS No abnormal hypermetabolic activity within the liver, pancreas,  adrenal glands, or spleen. No hypermetabolic lymph nodes in the abdomen or pelvis. 5 cm simple appearing cyst again seen in upper pole of left kidney. Aortic atherosclerosis. Previous hysterectomy. SKELETON No focal hypermetabolic activity to suggest skeletal metastasis. IMPRESSION: Hypermetabolic 5.1 cm posterior right upper lobe mass and 2.3 cm left upper lobe pulmonary nodule, consistent with synchronous primary bronchogenic carcinomas. Hypermetabolic bilateral mediastinal and right hilar lymphadenopathy, consistent with metastatic disease. No evidence of metastatic disease within the neck, abdomen, or pelvis. Electronically Signed   By: Earle Gell M.D.   On: 02/26/2017 10:50    Labs:  CBC:  Recent Labs  02/13/17 1347  WBC 6.6  HGB 13.4  HCT 40.3  PLT 211    COAGS: No results for input(s): INR, APTT in the last 8760 hours.  BMP:  Recent Labs  02/13/17 1347  NA  143  K 3.4*  CO2 26  GLUCOSE 109  BUN 15.2  CALCIUM 10.2  CREATININE 0.9    LIVER FUNCTION TESTS:  Recent Labs  02/13/17 1347  BILITOT 0.62  AST 13  ALT 12  ALKPHOS 79  PROT 7.8  ALBUMIN 3.8    TUMOR MARKERS: No results for input(s): AFPTM, CEA, CA199, CHROMGRNA in the last 8760 hours.  Assessment and Plan:  Right lung mass +PET Hx Breast Ca Now scheduled for right lung mass biopsy Risks and Benefits discussed with the patient including, but not limited to bleeding, hemoptysis, respiratory failure requiring intubation, infection, pneumothorax requiring chest tube placement, stroke from air embolism or even death. All of the patient's questions were answered, patient is agreeable to proceed. Consent signed and in chart.  Thank you for this interesting consult.  I greatly enjoyed meeting JENNICA TAGLIAFERRI and look forward to participating in their care.  A copy of this report was sent to the requesting provider on this date.  Electronically Signed: Monia Sabal A 02/27/2017, 10:26 AM   I spent a total of  30 Minutes   in face to face in clinical consultation, greater than 50% of which was counseling/coordinating care for right lung mass bx

## 2017-02-27 NOTE — Procedures (Signed)
Interventional Radiology Procedure Note  Procedure: CT guided biopsy of RUL mass.   Complications: None Recommendations: - Bedrest until CXR cleared.  Minimize talking, coughing or otherwise straining.  - Follow up 1 hr CXR pending  - 2 hour obs. - NPO until CXR is done  Signed,  Dulcy Fanny. Earleen Newport, DO

## 2017-02-28 ENCOUNTER — Encounter: Payer: Self-pay | Admitting: *Deleted

## 2017-03-03 ENCOUNTER — Encounter: Payer: Self-pay | Admitting: *Deleted

## 2017-03-03 NOTE — Progress Notes (Signed)
Oncology Nurse Navigator Documentation  Oncology Nurse Navigator Flowsheets 03/03/2017  Navigator Location [facility]  Navigator Encounter Type Other/per Dr. Julien Nordmann.  I notified pathology dept to send tissue obtained on 02/27/17 for PDL 1 testing.   Treatment Phase Pre-Tx/Tx Discussion  Barriers/Navigation Needs Coordination of Care  Interventions Coordination of Care  Coordination of Care Other  Acuity Level 2  Acuity Level 2 Other  Time Spent with Patient 30

## 2017-03-04 ENCOUNTER — Encounter: Payer: Self-pay | Admitting: Internal Medicine

## 2017-03-04 ENCOUNTER — Ambulatory Visit (HOSPITAL_BASED_OUTPATIENT_CLINIC_OR_DEPARTMENT_OTHER): Payer: Medicare Other | Admitting: Internal Medicine

## 2017-03-04 VITALS — BP 161/62 | HR 88 | Temp 97.6°F | Resp 18 | Ht 60.0 in | Wt 162.0 lb

## 2017-03-04 DIAGNOSIS — Z5111 Encounter for antineoplastic chemotherapy: Secondary | ICD-10-CM | POA: Insufficient documentation

## 2017-03-04 DIAGNOSIS — C3411 Malignant neoplasm of upper lobe, right bronchus or lung: Secondary | ICD-10-CM | POA: Diagnosis not present

## 2017-03-04 DIAGNOSIS — J449 Chronic obstructive pulmonary disease, unspecified: Secondary | ICD-10-CM

## 2017-03-04 DIAGNOSIS — C3491 Malignant neoplasm of unspecified part of right bronchus or lung: Secondary | ICD-10-CM

## 2017-03-04 HISTORY — DX: Encounter for antineoplastic chemotherapy: Z51.11

## 2017-03-04 HISTORY — DX: Malignant neoplasm of unspecified part of right bronchus or lung: C34.91

## 2017-03-04 MED ORDER — LIDOCAINE-PRILOCAINE 2.5-2.5 % EX CREA: 1.0000 "application " | TOPICAL_CREAM | CUTANEOUS | 0 refills | Status: DC | PRN

## 2017-03-04 MED ORDER — PROCHLORPERAZINE MALEATE 10 MG PO TABS: 10.0000 mg | ORAL_TABLET | Freq: Four times a day (QID) | ORAL | 0 refills | Status: DC | PRN

## 2017-03-04 NOTE — Progress Notes (Signed)
[place] [phone]   [phone]  OFFICE PROGRESS NOTE  Mathews Argyle, MD [address] [place] [address]  DIAGNOSIS: Stage IV (T2b, N3, M1 a) non-small cell lung cancer, squamous cell carcinoma presented with large right upper lobe lung mass in addition to bilateral mediastinal lymphadenopathy as well as left upper lobe lung nodule diagnosed in March 2018. PDL 1 expression is a still pending  PRIOR THERAPY: None  CURRENT THERAPY: None  INTERVAL HISTORY: Maria Washington 83 y.o. female returns to the clinic today for follow-up visit accompanied by 4 daughters and friend. The patient is feeling fine today with no specific complaints. She denied having any chest pain, shortness of breath, cough or hemoptysis. She has no significant weight loss or night sweats. She has no nausea, vomiting, diarrhea or constipation. Rad having any fever or chills. She had several studies performed recently including a PET scan as well as CT scan of the head in addition to CT-guided core biopsy of the right upper lobe lung mass by interventional radiology and the final pathology was consistent with squamous cell carcinoma. The patient is here today for evaluation and discussion of her treatment options.  MEDICAL HISTORY: Past Medical History:  Diagnosis Date  . Aortic atherosclerosis ([place]) 02/10/2017  . Arthritis   . CAD (coronary artery disease), native coronary artery    3 vessel calcification noted on CT scan   . Cardiac pacemaker in situ 12/05/2011  . Chronic diastolic congestive heart failure ([place])   . COPD (chronic obstructive pulmonary disease) ([facility])    spot on lung  . History of breast cancer    Treated with lumpectomy, radiation, and chemo sees Dr. Ralene Ok   . Hypertensive heart disease without CHF   . Hypothyroid   . Mitral regurgitation   . Paroxysmal atrial fibrillation ([facility])    CHA2DS2VASC score 5  . Second degree heart  block     ALLERGIES:  is allergic to clindamycin/lincomycin and penicillins.  MEDICATIONS:  Current Outpatient Prescriptions  Medication Sig Dispense Refill  . alendronate (FOSAMAX) 70 MG tablet Take 70 mg by mouth every 7 (seven) days. Monday    . apixaban (ELIQUIS) 5 MG TABS tablet Take 1 tablet (5 mg total) by mouth 2 (two) times daily. 60 tablet 3  . BIOTIN 5000 PO Take 5,000 mcg by mouth daily.     . calcium-vitamin D (OSCAL WITH D) 500-200 MG-UNIT per tablet Take 1 tablet by mouth 2 (two) times daily.      . cholecalciferol (VITAMIN D) 1000 units tablet Take 1 capsule by mouth daily.    Marland Kitchen diltiazem (CARDIZEM CD) 180 MG 24 hr capsule Take 180 mg by mouth daily.    . folic acid (FOLVITE) 1 MG tablet Take 1 mg by mouth daily.      . furosemide (LASIX) 40 MG tablet Take 40 mg by mouth daily.     Marland Kitchen levothyroxine (SYNTHROID, LEVOTHROID) 137 MCG tablet Take 137 mcg by mouth daily before breakfast.    . Omega-3 Fatty Acids (FISH OIL) 1200 MG CAPS Take 1,200 mg by mouth 2 (two) times daily.     . potassium chloride SA (K-DUR,KLOR-CON) 20 MEQ tablet Take 20-40 mEq by mouth See admin instructions. 2 tabs in the morning, and 1 tab in the evening    . vitamin B-12 (CYANOCOBALAMIN) 1000 MCG tablet Take 1,000 mcg by mouth daily.       No current facility-administered  medications for this visit.     SURGICAL HISTORY:  Past Surgical History:  Procedure Laterality Date  . ABDOMINAL HYSTERECTOMY    . BLADDER SUSPENSION    . BREAST LUMPECTOMY     lumpectomy  . CARPAL TUNNEL RELEASE     left wrist  . CHOLECYSTECTOMY    . FINGER SURGERY    . PERMANENT PACEMAKER INSERTION N/A 12/04/2011   Procedure: PERMANENT PACEMAKER INSERTION;  Surgeon: Evans Lance, MD;  Location: [facility] CATH LAB;  Service: Cardiovascular;  Laterality: N/A;  . SHOULDER SURGERY    . TONSILLECTOMY AND ADENOIDECTOMY      REVIEW OF SYSTEMS:  Constitutional: negative Eyes: negative Ears, nose, mouth, throat, and face:  negative Respiratory: negative Cardiovascular: negative Gastrointestinal: negative Genitourinary:negative Integument/breast: negative Hematologic/lymphatic: negative Musculoskeletal:negative Neurological: negative Behavioral/Psych: negative Endocrine: negative Allergic/Immunologic: negative   PHYSICAL EXAMINATION: General appearance: alert, cooperative, fatigued and no distress Head: Normocephalic, without obvious abnormality, atraumatic Neck: no adenopathy, no JVD, supple, symmetrical, trachea midline and thyroid not enlarged, symmetric, no tenderness/mass/nodules Lymph nodes: Cervical, supraclavicular, and axillary nodes normal. Resp: clear to auscultation bilaterally Back: symmetric, no curvature. ROM normal. No CVA tenderness. Cardio: regular rate and rhythm, S1, S2 normal, no murmur, click, rub or gallop GI: soft, non-tender; bowel sounds normal; no masses,  no organomegaly Extremities: extremities normal, atraumatic, no cyanosis or edema Neurologic: Alert and oriented X 3, normal strength and tone. Normal symmetric reflexes. Normal coordination and gait  ECOG PERFORMANCE STATUS: 1 - Symptomatic but completely ambulatory  Blood pressure (!) 161/62, pulse 88, temperature 97.6 F (36.4 C), temperature source Oral, resp. rate 18, height 5' (1.524 m), weight 162 lb (73.5 kg), SpO2 98 %.  LABORATORY DATA: Lab Results  Component Value Date   WBC 6.0 02/27/2017   HGB 12.7 02/27/2017   HCT 38.9 02/27/2017   MCV 91.5 02/27/2017   PLT 192 02/27/2017      Chemistry      Component Value Date/Time   NA 143 02/13/2017 1347   K 3.4 (L) 02/13/2017 1347   CL 99 (L) 09/29/2015 0544   CO2 26 02/13/2017 1347   BUN 15.2 02/13/2017 1347   CREATININE 0.9 02/13/2017 1347      Component Value Date/Time   CALCIUM 10.2 02/13/2017 1347   ALKPHOS 79 02/13/2017 1347   AST 13 02/13/2017 1347   ALT 12 02/13/2017 1347   BILITOT 0.62 02/13/2017 1347       RADIOGRAPHIC STUDIES: Dg Chest  2 View  Result Date: 02/04/2017 CLINICAL DATA:  Hemoptysis, history hypertension, breast cancer, COPD EXAM: CHEST  2 VIEW COMPARISON:  09/27/2015 FINDINGS: LEFT subclavian transvenous pacemaker leads project at RIGHT atrium and RIGHT ventricle. Enlargement of cardiac silhouette. Pulmonary vascularity normal. Enlargement of AP window question adenopathy. RIGHT upper lobe mass posteriorly 5.0 x 4.8 x 4.5 cm highly worrisome for neoplasm, question primary versus metastatic. Remaining lungs clear. No pleural effusion or pneumothorax. Bones demineralized. Prior LEFT breast surgery and axillary node dissection. IMPRESSION: RIGHT upper lobe mass with suspect AP window adenopathy, highly concerning for neoplasm question primary versus metastatic in this patient with a history of breast cancer; CT chest with contrast recommended for further assessment. These results will be called to the ordering clinician or representative by the Radiologist Assistant, and communication documented in the PACS or zVision Dashboard. Electronically Signed   By: Lavonia Dana M.D.   On: 02/04/2017 17:26   Ct Head W Wo Contrast  Result Date: 02/24/2017 CLINICAL DATA:  New diagnosis lung  cancer.  Staging. EXAM: CT HEAD WITHOUT AND WITH CONTRAST TECHNIQUE: Contiguous axial images were obtained from the base of the skull through the vertex without and with intravenous contrast CONTRAST:  22m ISOVUE-300 IOPAMIDOL (ISOVUE-300) INJECTION 61% COMPARISON:  05/14/2014 FINDINGS: Brain: Mild age related atrophy. Mild chronic small-vessel ischemic changes of the white matter. No sign of acute infarction, mass lesion, hemorrhage, hydrocephalus or extra-axial collection. Vascular: There is atherosclerotic calcification of the major vessels at the base of the brain. Skull: Normal Sinuses/Orbits: Clear/normal Other: None significant IMPRESSION: No evidence of metastatic disease. Atrophy and chronic small-vessel white matter disease. Electronically Signed    By: MNelson ChimesM.D.   On: 02/24/2017 16:20   Ct Chest W Contrast  Result Date: 02/07/2017 CLINICAL DATA:  Hemoptysis, shortness of breath, right lung mass on chest radiograph EXAM: CT CHEST WITH CONTRAST TECHNIQUE: Multidetector CT imaging of the chest was performed during intravenous contrast administration. CONTRAST:  749mISOVUE-300 IOPAMIDOL (ISOVUE-300) INJECTION 61% COMPARISON:  Chest radiographs dated 02/04/2017 FINDINGS: Cardiovascular: Cardiomegaly.  No pericardial effusion. Three vessel coronary atherosclerosis. Atherosclerotic calcifications of the aortic arch. Left subclavian pacemaker. Mediastinum/Nodes: Thoracic lymphadenopathy, including: --2.8 cm short axis AP window node (series 3/ image 38) --1.3 cm short axis high right paratracheal node (series 3/image 39) --2.1 cm short axis prevascular node (series 3/image 40) --1.2 cm short axis low right paratracheal node (series 3/image 43) Lungs/Pleura: 4.5 x 5.8 cm mass-like opacity in the posterior right upper lobe (series 4/ image 38), corresponding to the radiographic abnormality. Central necrosis (series 3/ image 38). This appearance is suspicious for primary bronchogenic neoplasm. Additional 1.5 x 2.1 cm nodule in the anterior left upper lobe (series 4/ image 29), suspicious for metastasis. Evaluation of the lung parenchyma is otherwise notable for motion degradation. Mild biapical pleural-parenchymal scarring. No pleural effusion or pneumothorax. Upper Abdomen: Visualized upper abdomen is notable for a dominant 5.2 cm left upper pole renal cyst (series 3/ image 115). Musculoskeletal: Degenerative changes of the visualized thoracolumbar spine. IMPRESSION: 5.8 cm posterior right upper lobe mass, suspicious for primary bronchogenic neoplasm. Additional 2.1 cm anterior left upper lobe pulmonary nodule, suspicious for metastasis. Mediastinal lymphadenopathy, including a dominant 2.8 cm short axis AP window node, suspicious for nodal metastases.  Electronically Signed   By: SrJulian Hy.D.   On: 02/07/2017 14:55   Nm Pet Image Initial (pi) Skull Base To Thigh  Result Date: 02/26/2017 CLINICAL DATA:  Initial treatment strategy for right upper lobe lung mass. EXAM: NUCLEAR MEDICINE PET SKULL BASE TO THIGH TECHNIQUE: 8.0 mCi F-18 FDG was injected intravenously. Full-ring PET imaging was performed from the skull base to thigh after the radiotracer. CT data was obtained and used for attenuation correction and anatomic localization. FASTING BLOOD GLUCOSE:  Value: 142 mg/dl COMPARISON:  Chest CT on 02/07/2017 FINDINGS: NECK No hypermetabolic lymph nodes in the neck. CHEST 5.1 cm mass in the posterior right upper lobe abutting the major fissure is hypermetabolic, with SUV max of 28.6. 2.3 cm pulmonary nodule in the central left upper lobe is also hypermetabolic, with SUV max of 14.9. Hypermetabolic mediastinal lymphadenopathy is seen in the lateral aortic region, with largest area measuring 3.0 cm in short axis on image 64/4, with SUV max of 13.7. A 10 mm right paratracheal lymph node on image 64/4 shows FDG uptake, with SUV max of 3.5. Asymmetric hypermetabolic activity is also seen within the right hilum, with SUV max of 5.1. ABDOMEN/PELVIS No abnormal hypermetabolic activity within the liver, pancreas, adrenal glands,  or spleen. No hypermetabolic lymph nodes in the abdomen or pelvis. 5 cm simple appearing cyst again seen in upper pole of left kidney. Aortic atherosclerosis. Previous hysterectomy. SKELETON No focal hypermetabolic activity to suggest skeletal metastasis. IMPRESSION: Hypermetabolic 5.1 cm posterior right upper lobe mass and 2.3 cm left upper lobe pulmonary nodule, consistent with synchronous primary bronchogenic carcinomas. Hypermetabolic bilateral mediastinal and right hilar lymphadenopathy, consistent with metastatic disease. No evidence of metastatic disease within the neck, abdomen, or pelvis. Electronically Signed   By: Earle Gell  M.D.   On: 02/26/2017 10:50   Ct Biopsy  Result Date: 02/27/2017 INDICATION: 83 year old female with a history of FDG avid mass right upper lobe. EXAM: CT BIOPSY MEDICATIONS: None. ANESTHESIA/SEDATION: Moderate (conscious) sedation was employed during this procedure. A total of Versed 1.0 mg and Fentanyl 50 mcg was administered intravenously. Moderate Sedation Time: 15 minutes. The patient's level of consciousness and vital signs were monitored continuously by radiology nursing throughout the procedure under my direct supervision. FLUOROSCOPY TIME:  None COMPLICATIONS: None PROCEDURE: The procedure, risks, benefits, and alternatives were explained to the patient and the patient's family. Specific risks that were addressed included bleeding, infection, pneumothorax, need for further procedure including chest tube placement, chance of delayed pneumothorax or hemorrhage, hemoptysis, nondiagnostic sample, cardiopulmonary collapse, death. Questions regarding the procedure were encouraged and answered. The patient understands and consents to the procedure. Patient was positioned in the right decubitus position on the CT gantry table and a scout CT of the chest was performed for planning purposes. Once angle of approach was determined, the skin and subcutaneous tissues this scan was prepped and draped in the usual sterile fashion, and a sterile drape was applied covering the operative field. A sterile gown and sterile gloves were used for the procedure. Local anesthesia was provided with 1% Lidocaine. The skin and subcutaneous tissues were infiltrated 1% lidocaine for local anesthesia, and a small stab incision was made with an 11 blade scalpel. Using CT guidance, a 17 gauge trocar needle was advanced into the right upper lobetarget. After confirmation of the tip, separate 18 gauge core biopsies were performed. These were placed into solution for transportation to the lab. A final CT image was performed. Patient  tolerated the procedure well and remained hemodynamically stable throughout. No complications were encountered and no significant blood loss was encounter IMPRESSION: Status post CT-guided biopsy of right upper lobe mass. Tissue specimen sent to pathology for complete histopathologic analysis. Signed, Dulcy Fanny. Earleen [place], DO [facility] Electronically Signed   By: Corrie Mckusick D.O.   On: 02/27/2017 13:25   Dg Chest Port 1 View  Result Date: 02/27/2017 CLINICAL DATA:  83 year old female status post CT-guided right upper lobe lung biopsy today. EXAM: PORTABLE CHEST 1 VIEW COMPARISON:  CT-guided lung biopsy images 1227 hours today. Chest radiographs 12/04/2017 FINDINGS: Portable AP upright view at 1344 hours. Stable cardiac size and mediastinal contours. Medial right upper lung masslike opacity appears stable compared to February. No pneumothorax. Increased pulmonary vascularity without overt edema. No pleural effusion or new pulmonary opacity. Stable left chest cardiac pacemaker. Stable left axillary surgical clips. IMPRESSION: No acute or adverse features status post CT-guided right upper lung mass biopsy today. Electronically Signed   By: Genevie Ann M.D.   On: 02/27/2017 14:35    ASSESSMENT AND PLAN: This is a very pleasant 83 years old white female recently diagnosed with a stage IV non-small cell lung cancer, squamous cell carcinoma with bilateral pulmonary  masses as well as mediastinal lymphadenopathy diagnosed in March 2018. PDL 1 expression is a still pending I personally and independently reviewed the scan images and discussed the results with the patient and her family and showed them the images of the PET scan. I also had a lengthy discussion with the patient and her family about her treatment options and goals of care. I gave her the option of palliative care and hospice referral versus consideration of treatment with systemic chemotherapy with  carboplatin for AUC of 5 and paclitaxel 175 MG/M2 every 3 weeks if PDL 1 expression is less than 50% of treatment with Ketruda (pembrolizumab) every 3 weeks if PDL 1 expression is over 50%. I discussed with the patient and her family the adverse effect of pulse options. The patient is interested in treatment with immunotherapy of possible and she would like to wait for the results to become available. Once those results become available, I will arrange for the patient her treatment accordingly. I will also arrange for the patient to have a chemotherapy education class before the first dose of her treatment. I will see her for follow-up visit one week after the first dose of her treatment. I will also refer the patient to interventional radiology for consideration of Port-A-Cath placement. I will call her pharmacy with prescription for Compazine 10 mg by mouth every 6 hours as needed for nausea in addition to Emla cream to be applied to the Port-A-Cath site before treatment. She was advised to call immediately if she has any concerning symptoms in the interval. The patient voices understanding of current disease status and treatment options and is in agreement with the current care plan.  All questions were answered. The patient knows to call the clinic with any problems, questions or concerns. We can certainly see the patient much sooner if necessary.  I spent 15 minutes counseling the patient face to face. The total time spent in the appointment was 25 minutes.  Disclaimer: This note was dictated with voice recognition software. Similar sounding words can inadvertently be transcribed and may not be corrected upon review.

## 2017-03-05 ENCOUNTER — Encounter: Payer: Self-pay | Admitting: Internal Medicine

## 2017-03-05 NOTE — Progress Notes (Signed)
Received PA request for Lidocaine-Prilocaine cream.  Submitted via Cover my Meds.  Maria Washington (Key: [id number])   Your information has been submitted to [place]. [employer] will review the request and notify you of the determination decision directly, typically within 3 business days of your submission and once all necessary information is received. You will also receive your request decision electronically. To check for an update later, open the request again from your dashboard. If [employer] [place] has not responded within the specified timeframe or if you have any questions about your PA submission, contact [place] directly at [facility]) [phone] or ([place]) [phone].

## 2017-03-06 ENCOUNTER — Encounter: Payer: Self-pay | Admitting: Internal Medicine

## 2017-03-06 NOTE — Progress Notes (Signed)
Received approval from [facility] for Lidocaine-Prilocaine cream. Approval is 03/05/17-03/05/18 as long as she remains enrolled in the plan,continues to be on the drug, and information as far as drug being in the formulary. Called [facility]([place]) to advise of approval. She states it went through.

## 2017-03-10 ENCOUNTER — Encounter: Payer: Self-pay | Admitting: *Deleted

## 2017-03-10 NOTE — Progress Notes (Signed)
Oncology Nurse Navigator Documentation  Oncology Nurse Navigator Flowsheets 03/10/2017  Navigator Location [facility]  Navigator Encounter Type Other/I followed with pathology regarding PDL 1 results.  Testing was sent out on 03/03/17.  I was told that results are not back yet.  I will update Dr. Julien Nordmann.   Treatment Phase Pre-Tx/Tx Discussion  Barriers/Navigation Needs Coordination of Care  Interventions Coordination of Care  Coordination of Care Other  Acuity Level 2  Acuity Level 2 Other  Time Spent with Patient 30

## 2017-03-11 ENCOUNTER — Encounter (HOSPITAL_COMMUNITY): Payer: Self-pay

## 2017-03-11 ENCOUNTER — Other Ambulatory Visit: Payer: Self-pay | Admitting: Student

## 2017-03-12 ENCOUNTER — Ambulatory Visit (HOSPITAL_COMMUNITY)
Admission: RE | Admit: 2017-03-12 | Discharge: 2017-03-12 | Disposition: A | Discharge status: Home / Self Care | Payer: Medicare Other | Source: Ambulatory Visit | Attending: Internal Medicine | Admitting: Internal Medicine

## 2017-03-12 ENCOUNTER — Encounter (HOSPITAL_COMMUNITY): Payer: Self-pay

## 2017-03-12 ENCOUNTER — Other Ambulatory Visit: Payer: Self-pay | Admitting: Internal Medicine

## 2017-03-12 DIAGNOSIS — I2510 Atherosclerotic heart disease of native coronary artery without angina pectoris: Secondary | ICD-10-CM | POA: Diagnosis not present

## 2017-03-12 DIAGNOSIS — Z880 Allergy status to penicillin: Secondary | ICD-10-CM | POA: Diagnosis not present

## 2017-03-12 DIAGNOSIS — Z950 Presence of cardiac pacemaker: Secondary | ICD-10-CM | POA: Insufficient documentation

## 2017-03-12 DIAGNOSIS — I5032 Chronic diastolic (congestive) heart failure: Secondary | ICD-10-CM | POA: Insufficient documentation

## 2017-03-12 DIAGNOSIS — I110 Hypertensive heart disease with heart failure: Secondary | ICD-10-CM | POA: Insufficient documentation

## 2017-03-12 DIAGNOSIS — I480 Paroxysmal atrial fibrillation: Secondary | ICD-10-CM | POA: Insufficient documentation

## 2017-03-12 DIAGNOSIS — C3491 Malignant neoplasm of unspecified part of right bronchus or lung: Secondary | ICD-10-CM

## 2017-03-12 DIAGNOSIS — Z5111 Encounter for antineoplastic chemotherapy: Secondary | ICD-10-CM

## 2017-03-12 DIAGNOSIS — Z87891 Personal history of nicotine dependence: Secondary | ICD-10-CM | POA: Diagnosis not present

## 2017-03-12 DIAGNOSIS — J449 Chronic obstructive pulmonary disease, unspecified: Secondary | ICD-10-CM

## 2017-03-12 DIAGNOSIS — M1990 Unspecified osteoarthritis, unspecified site: Secondary | ICD-10-CM | POA: Diagnosis not present

## 2017-03-12 DIAGNOSIS — I441 Atrioventricular block, second degree: Secondary | ICD-10-CM | POA: Insufficient documentation

## 2017-03-12 DIAGNOSIS — Z8249 Family history of ischemic heart disease and other diseases of the circulatory system: Secondary | ICD-10-CM | POA: Insufficient documentation

## 2017-03-12 DIAGNOSIS — I340 Nonrheumatic mitral (valve) insufficiency: Secondary | ICD-10-CM | POA: Diagnosis not present

## 2017-03-12 DIAGNOSIS — Z7901 Long term (current) use of anticoagulants: Secondary | ICD-10-CM | POA: Diagnosis not present

## 2017-03-12 DIAGNOSIS — E039 Hypothyroidism, unspecified: Secondary | ICD-10-CM | POA: Diagnosis not present

## 2017-03-12 DIAGNOSIS — Z853 Personal history of malignant neoplasm of breast: Secondary | ICD-10-CM | POA: Diagnosis not present

## 2017-03-12 HISTORY — PX: IR FLUORO GUIDE PORT INSERTION RIGHT: IMG5741

## 2017-03-12 HISTORY — PX: IR US GUIDE VASC ACCESS RIGHT: IMG2390

## 2017-03-12 LAB — APTT: APTT: 35 s (ref 24–36)

## 2017-03-12 LAB — CBC
HCT: 40.5 % (ref 36.0–46.0)
Hemoglobin: 13.6 g/dL (ref 12.0–15.0)
MCH: 30.2 pg (ref 26.0–34.0)
MCHC: 33.6 g/dL (ref 30.0–36.0)
MCV: 90 fL (ref 78.0–100.0)
Platelets: 224 10*3/uL (ref 150–400)
RBC: 4.5 MIL/uL (ref 3.87–5.11)
RDW: 14.5 % (ref 11.5–15.5)
WBC: 7.1 10*3/uL (ref 4.0–10.5)

## 2017-03-12 LAB — PROTIME-INR
INR: 0.95
PROTHROMBIN TIME: 12.7 s (ref 11.4–15.2)

## 2017-03-12 MED ORDER — VANCOMYCIN HCL IN DEXTROSE 1-5 GM/200ML-% IV SOLN
1000.0000 mg | INTRAVENOUS | Status: AC
  Administered 2017-03-12: 1000 mg via INTRAVENOUS

## 2017-03-12 MED ORDER — LIDOCAINE HCL 1 % IJ SOLN
INTRAMUSCULAR | Status: AC | PRN
  Administered 2017-03-12: 15 mL

## 2017-03-12 MED ORDER — SODIUM CHLORIDE 0.9 % IV SOLN
INTRAVENOUS | Status: DC
  Administered 2017-03-12: 13:00:00 via INTRAVENOUS

## 2017-03-12 MED ORDER — HEPARIN SOD (PORK) LOCK FLUSH 100 UNIT/ML IV SOLN
INTRAVENOUS | Status: AC | PRN
  Administered 2017-03-12: 500 [IU]

## 2017-03-12 MED ORDER — MIDAZOLAM HCL 2 MG/2ML IJ SOLN
INTRAMUSCULAR | Status: AC
  Filled 2017-03-12: qty 6

## 2017-03-12 MED ORDER — LIDOCAINE HCL 1 % IJ SOLN
INTRAMUSCULAR | Status: AC
  Filled 2017-03-12: qty 20

## 2017-03-12 MED ORDER — FENTANYL CITRATE (PF) 100 MCG/2ML IJ SOLN
INTRAMUSCULAR | Status: AC | PRN
  Administered 2017-03-12 (×2): 25 ug via INTRAVENOUS
  Administered 2017-03-12: 50 ug via INTRAVENOUS

## 2017-03-12 MED ORDER — VANCOMYCIN HCL IN DEXTROSE 1-5 GM/200ML-% IV SOLN
INTRAVENOUS | Status: AC
  Filled 2017-03-12: qty 200

## 2017-03-12 MED ORDER — HEPARIN SOD (PORK) LOCK FLUSH 100 UNIT/ML IV SOLN
INTRAVENOUS | Status: AC
  Filled 2017-03-12: qty 5

## 2017-03-12 MED ORDER — FENTANYL CITRATE (PF) 100 MCG/2ML IJ SOLN
INTRAMUSCULAR | Status: AC
  Filled 2017-03-12: qty 2

## 2017-03-12 MED ORDER — MIDAZOLAM HCL 2 MG/2ML IJ SOLN
INTRAMUSCULAR | Status: AC | PRN
  Administered 2017-03-12 (×3): 1 mg via INTRAVENOUS

## 2017-03-12 NOTE — Procedures (Signed)
Interventional Radiology Procedure Note  Procedure: Placement of a right IJ approach single lumen PowerPort.  Tip is positioned at the superior cavoatrial junction and catheter is ready for immediate use.  Complications: None Recommendations:  - Ok to shower tomorrow - Do not submerge for 7 days - Routine line care   Signed,  Leonna Schlee S. Amana Bouska, DO   

## 2017-03-12 NOTE — Sedation Documentation (Signed)
Patient is resting comfortably. 

## 2017-03-12 NOTE — Discharge Instructions (Addendum)
Implanted Port Insertion, Care After °This sheet gives you information about how to care for yourself after your procedure. Your health care provider may also give you more specific instructions. If you have problems or questions, contact your health care provider. °What can I expect after the procedure? °After your procedure, it is common to have: °· Discomfort at the port insertion site. °· Bruising on the skin over the port. This should improve over 3-4 days. ° °Follow these instructions at home: °Port care °· After your port is placed, you will get a manufacturer's information card. The card has information about your port. Keep this card with you at all times. °· Take care of the port as told by your health care provider. Ask your health care provider if you or a family member can get training for taking care of the port at home. A home health care nurse may also take care of the port. °· Make sure to remember what type of port you have. °Incision care °· Follow instructions from your health care provider about how to take care of your port insertion site. Make sure you: °? Wash your hands with soap and water before you change your bandage (dressing). If soap and water are not available, use hand sanitizer. °? Change your dressing as told by your health care provider. °? Leave stitches (sutures), skin glue, or adhesive strips in place. These skin closures may need to stay in place for 2 weeks or longer. If adhesive strip edges start to loosen and curl up, you may trim the loose edges. Do not remove adhesive strips completely unless your health care provider tells you to do that. °· Check your port insertion site every day for signs of infection. Check for: °? More redness, swelling, or pain. °? More fluid or blood. °? Warmth. °? Pus or a bad smell. °General instructions °· Do not take baths, swim, or use a hot tub until your health care provider approves. °· Do not lift anything that is heavier than 10 lb (4.5  kg) for a week, or as told by your health care provider. °· Ask your health care provider when it is okay to: °? Return to work or school. °? Resume usual physical activities or sports. °· Do not drive for 24 hours if you were given a medicine to help you relax (sedative). °· Take over-the-counter and prescription medicines only as told by your health care provider. °· Wear a medical alert bracelet in case of an emergency. This will tell any health care providers that you have a port. °· Keep all follow-up visits as told by your health care provider. This is important. °Contact a health care provider if: °· You cannot flush your port with saline as directed, or you cannot draw blood from the port. °· You have a fever or chills. °· You have more redness, swelling, or pain around your port insertion site. °· You have more fluid or blood coming from your port insertion site. °· Your port insertion site feels warm to the touch. °· You have pus or a bad smell coming from the port insertion site. °Get help right away if: °· You have chest pain or shortness of breath. °· You have bleeding from your port that you cannot control. °Summary °· Take care of the port as told by your health care provider. °· Change your dressing as told by your health care provider. °· Keep all follow-up visits as told by your health care provider. °  care provider. This information is not intended to replace advice given to you by your health care provider. Make sure you discuss any questions you have with your health care provider. Document Released: 09/15/2013 Document Revised: 10/16/2016 Document Reviewed: 10/16/2016 Elsevier Interactive Patient Education  2017 [employer]. Moderate Conscious Sedation, Adult, Care After These instructions provide you with information about caring for yourself after your procedure. Your health care provider may also give you more specific instructions. Your treatment has been planned according to current medical  practices, but problems sometimes occur. Call your health care provider if you have any problems or questions after your procedure. What can I expect after the procedure? After your procedure, it is common:  To feel sleepy for several hours.  To feel clumsy and have poor balance for several hours.  To have poor judgment for several hours.  To vomit if you eat too soon. Follow these instructions at home: For at least 24 hours after the procedure:    Do not:  Participate in activities where you could fall or become injured.  Drive.  Use heavy machinery.  Drink alcohol.  Take sleeping pills or medicines that cause drowsiness.  Make important decisions or sign legal documents.  Take care of children on your own.  Rest. Eating and drinking   Follow the diet recommended by your health care provider.  If you vomit:  Drink water, juice, or soup when you can drink without vomiting.  Make sure you have little or no nausea before eating solid foods. General instructions   Have a responsible adult stay with you until you are awake and alert.  Take over-the-counter and prescription medicines only as told by your health care provider.  If you smoke, do not smoke without supervision.  Keep all follow-up visits as told by your health care provider. This is important. Contact a health care provider if:  You keep feeling nauseous or you keep vomiting.  You feel light-headed.  You develop a rash.  You have a fever. Get help right away if:  You have trouble breathing. This information is not intended to replace advice given to you by your health care provider. Make sure you discuss any questions you have with your health care provider. Document Released: 09/15/2013 Document Revised: 04/29/2016 Document Reviewed: 03/16/2016 Elsevier Interactive Patient Education  2017 [employer].

## 2017-03-12 NOTE — Sedation Documentation (Signed)
Pt reports pain. 

## 2017-03-12 NOTE — H&P (Signed)
Chief Complaint: Patient was seen in consultation today for lung cancer  Referring Physician(s):  Mohamed,Mohamed  Supervising Physician: Corrie Mckusick  Patient Status: [facility] - Out-pt  History of Present Illness: RUTA CAPECE is a 83 y.o. female with past medical history of CHF, COPD, hypothyroid, paroxysmal atrial fibrillation, and breast cancer who presents with complaint of squamous cell cancer of the right lung.  IR consulted for Port-A-Cath placement at the request of Dr. Julien Nordmann.   Patient has been NPO. She does not take blood thinners.  She has been in her usual state of health.   Past Medical History:  Diagnosis Date  . Aortic atherosclerosis ([place]) 02/10/2017  . Arthritis   . CAD (coronary artery disease), native coronary artery    3 vessel calcification noted on CT scan   . Cardiac pacemaker in situ 12/05/2011  . Chronic diastolic congestive heart failure ([place])   . COPD (chronic obstructive pulmonary disease) ([facility])    spot on lung  . Encounter for antineoplastic chemotherapy 03/04/2017  . History of breast cancer    Treated with lumpectomy, radiation, and chemo sees Dr. Ralene Ok   . Hypertensive heart disease without CHF   . Hypothyroid   . Mitral regurgitation   . Paroxysmal atrial fibrillation ([facility])    CHA2DS2VASC score 5  . Second degree heart block   . Stage IV squamous cell carcinoma of right lung ([place]) 03/04/2017    Past Surgical History:  Procedure Laterality Date  . ABDOMINAL HYSTERECTOMY    . BLADDER SUSPENSION    . BREAST LUMPECTOMY     lumpectomy  . CARPAL TUNNEL RELEASE     left wrist  . CHOLECYSTECTOMY    . FINGER SURGERY    . PERMANENT PACEMAKER INSERTION N/A 12/04/2011   Procedure: PERMANENT PACEMAKER INSERTION;  Surgeon: Evans Lance, MD;  Location: [facility] CATH LAB;  Service: Cardiovascular;  Laterality: N/A;  . SHOULDER SURGERY    . TONSILLECTOMY AND ADENOIDECTOMY      Allergies: Clindamycin/lincomycin and  Penicillins  Medications: Prior to Admission medications   Medication Sig Start Date End Date Taking? Authorizing Provider  BIOTIN 5000 PO Take 5,000 mcg by mouth daily.    Yes Historical Provider, MD  calcium-vitamin D (OSCAL WITH D) 500-200 MG-UNIT per tablet Take 1 tablet by mouth 2 (two) times daily.     Yes Historical Provider, MD  cholecalciferol (VITAMIN D) 1000 units tablet Take 1 capsule by mouth daily.   Yes Historical Provider, MD  diltiazem (CARDIZEM CD) 180 MG 24 hr capsule Take 180 mg by mouth daily.   Yes Historical Provider, MD  folic acid (FOLVITE) 1 MG tablet Take 1 mg by mouth daily.     Yes Historical Provider, MD  furosemide (LASIX) 40 MG tablet Take 40 mg by mouth daily.    Yes Historical Provider, MD  levothyroxine (SYNTHROID, LEVOTHROID) 137 MCG tablet Take 137 mcg by mouth daily before breakfast.   Yes Historical Provider, MD  Omega-3 Fatty Acids (FISH OIL) 1200 MG CAPS Take 1,200 mg by mouth 2 (two) times daily.    Yes Historical Provider, MD  potassium chloride SA (K-DUR,KLOR-CON) 20 MEQ tablet Take 20-40 mEq by mouth See admin instructions. 2 tabs in the morning, and 1 tab in the evening   Yes Historical Provider, MD  vitamin B-12 (CYANOCOBALAMIN) 1000 MCG tablet Take 1,000 mcg by mouth daily.     Yes Historical Provider, MD  alendronate (FOSAMAX) 70 MG tablet Take 70 mg by mouth  every 7 (seven) days. Monday 03/21/14   Historical Provider, MD  apixaban (ELIQUIS) 5 MG TABS tablet Take 1 tablet (5 mg total) by mouth 2 (two) times daily. 05/03/14   Bobby Rumpf [place], PA-C  lidocaine-prilocaine (EMLA) cream Apply 1 application topically as needed. 03/04/17   Curt Bears, MD  prochlorperazine (COMPAZINE) 10 MG tablet Take 1 tablet (10 mg total) by mouth every 6 (six) hours as needed for nausea or vomiting. 03/04/17   Curt Bears, MD     Family History  Problem Relation Age of Onset  . Cancer Father   . Heart attack Mother   . Cancer Brother     Social History    Social History  . Marital status: Widowed    Spouse name: N/A  . Number of children: N/A  . Years of education: N/A   Social History Main Topics  . Smoking status: Former Smoker    Packs/day: 1.00    Years: 60.00    Types: Cigarettes    Quit date: 06/07/2013  . Smokeless tobacco: Never Used  . Alcohol use No  . Drug use: No  . Sexual activity: Not Currently   Other Topics Concern  . None   Social History Narrative   Widow.  Has 22 dogs.    Review of Systems  Constitutional: Negative for fatigue and fever.  Respiratory: Negative for cough and shortness of breath.   Cardiovascular: Negative for chest pain.  Psychiatric/Behavioral: Negative for behavioral problems and confusion.    Vital Signs: BP 133/73   Pulse 76   Temp 98.3 F (36.8 C) (Oral)   Resp 16   Ht 5' (1.524 m)   Wt 162 lb (73.5 kg)   SpO2 98%   BMI 31.64 kg/m   Physical Exam  Constitutional: She is oriented to person, place, and time. She appears well-developed.  Cardiovascular: Normal rate, regular rhythm and normal heart sounds.   Pulmonary/Chest: Effort normal and breath sounds normal. No respiratory distress.  Neurological: She is alert and oriented to person, place, and time.  Skin: Skin is warm and dry.  Psychiatric: She has a normal mood and affect. Her behavior is normal. Judgment and thought content normal.  Nursing note and vitals reviewed.   Mallampati Score:     Imaging: Ct Head W Wo Contrast  Result Date: 02/24/2017 CLINICAL DATA:  New diagnosis lung cancer.  Staging. EXAM: CT HEAD WITHOUT AND WITH CONTRAST TECHNIQUE: Contiguous axial images were obtained from the base of the skull through the vertex without and with intravenous contrast CONTRAST:  92m ISOVUE-300 IOPAMIDOL (ISOVUE-300) INJECTION 61% COMPARISON:  05/14/2014 FINDINGS: Brain: Mild age related atrophy. Mild chronic small-vessel ischemic changes of the white matter. No sign of acute infarction, mass lesion, hemorrhage,  hydrocephalus or extra-axial collection. Vascular: There is atherosclerotic calcification of the major vessels at the base of the brain. Skull: Normal Sinuses/Orbits: Clear/normal Other: None significant IMPRESSION: No evidence of metastatic disease. Atrophy and chronic small-vessel white matter disease. Electronically Signed   By: MNelson ChimesM.D.   On: 02/24/2017 16:20   Nm Pet Image Initial (pi) Skull Base To Thigh  Result Date: 02/26/2017 CLINICAL DATA:  Initial treatment strategy for right upper lobe lung mass. EXAM: NUCLEAR MEDICINE PET SKULL BASE TO THIGH TECHNIQUE: 8.0 mCi F-18 FDG was injected intravenously. Full-ring PET imaging was performed from the skull base to thigh after the radiotracer. CT data was obtained and used for attenuation correction and anatomic localization. FASTING BLOOD GLUCOSE:  Value: 142  mg/dl COMPARISON:  Chest CT on 02/07/2017 FINDINGS: NECK No hypermetabolic lymph nodes in the neck. CHEST 5.1 cm mass in the posterior right upper lobe abutting the major fissure is hypermetabolic, with SUV max of 28.6. 2.3 cm pulmonary nodule in the central left upper lobe is also hypermetabolic, with SUV max of 14.9. Hypermetabolic mediastinal lymphadenopathy is seen in the lateral aortic region, with largest area measuring 3.0 cm in short axis on image 64/4, with SUV max of 13.7. A 10 mm right paratracheal lymph node on image 64/4 shows FDG uptake, with SUV max of 3.5. Asymmetric hypermetabolic activity is also seen within the right hilum, with SUV max of 5.1. ABDOMEN/PELVIS No abnormal hypermetabolic activity within the liver, pancreas, adrenal glands, or spleen. No hypermetabolic lymph nodes in the abdomen or pelvis. 5 cm simple appearing cyst again seen in upper pole of left kidney. Aortic atherosclerosis. Previous hysterectomy. SKELETON No focal hypermetabolic activity to suggest skeletal metastasis. IMPRESSION: Hypermetabolic 5.1 cm posterior right upper lobe mass and 2.3 cm left upper  lobe pulmonary nodule, consistent with synchronous primary bronchogenic carcinomas. Hypermetabolic bilateral mediastinal and right hilar lymphadenopathy, consistent with metastatic disease. No evidence of metastatic disease within the neck, abdomen, or pelvis. Electronically Signed   By: Earle Gell M.D.   On: 02/26/2017 10:50   Ct Biopsy  Result Date: 02/27/2017 INDICATION: 83 year old female with a history of FDG avid mass right upper lobe. EXAM: CT BIOPSY MEDICATIONS: None. ANESTHESIA/SEDATION: Moderate (conscious) sedation was employed during this procedure. A total of Versed 1.0 mg and Fentanyl 50 mcg was administered intravenously. Moderate Sedation Time: 15 minutes. The patient's level of consciousness and vital signs were monitored continuously by radiology nursing throughout the procedure under my direct supervision. FLUOROSCOPY TIME:  None COMPLICATIONS: None PROCEDURE: The procedure, risks, benefits, and alternatives were explained to the patient and the patient's family. Specific risks that were addressed included bleeding, infection, pneumothorax, need for further procedure including chest tube placement, chance of delayed pneumothorax or hemorrhage, hemoptysis, nondiagnostic sample, cardiopulmonary collapse, death. Questions regarding the procedure were encouraged and answered. The patient understands and consents to the procedure. Patient was positioned in the right decubitus position on the CT gantry table and a scout CT of the chest was performed for planning purposes. Once angle of approach was determined, the skin and subcutaneous tissues this scan was prepped and draped in the usual sterile fashion, and a sterile drape was applied covering the operative field. A sterile gown and sterile gloves were used for the procedure. Local anesthesia was provided with 1% Lidocaine. The skin and subcutaneous tissues were infiltrated 1% lidocaine for local anesthesia, and a small stab incision was made  with an 11 blade scalpel. Using CT guidance, a 17 gauge trocar needle was advanced into the right upper lobetarget. After confirmation of the tip, separate 18 gauge core biopsies were performed. These were placed into solution for transportation to the lab. A final CT image was performed. Patient tolerated the procedure well and remained hemodynamically stable throughout. No complications were encountered and no significant blood loss was encounter IMPRESSION: Status post CT-guided biopsy of right upper lobe mass. Tissue specimen sent to pathology for complete histopathologic analysis. Signed, Dulcy Fanny. Earleen Newport, DO [facility] Electronically Signed   By: Corrie Mckusick D.O.   On: 02/27/2017 13:25   Dg Chest Port 1 View  Result Date: 02/27/2017 CLINICAL DATA:  83 year old female status post CT-guided right upper lobe lung biopsy today. EXAM: PORTABLE  CHEST 1 VIEW COMPARISON:  CT-guided lung biopsy images 1227 hours today. Chest radiographs 12/04/2017 FINDINGS: Portable AP upright view at 1344 hours. Stable cardiac size and mediastinal contours. Medial right upper lung masslike opacity appears stable compared to February. No pneumothorax. Increased pulmonary vascularity without overt edema. No pleural effusion or new pulmonary opacity. Stable left chest cardiac pacemaker. Stable left axillary surgical clips. IMPRESSION: No acute or adverse features status post CT-guided right upper lung mass biopsy today. Electronically Signed   By: Genevie Ann M.D.   On: 02/27/2017 14:35    Labs:  CBC:  Recent Labs  02/13/17 1347 02/27/17 0950  WBC 6.6 6.0  HGB 13.4 12.7  HCT 40.3 38.9  PLT 211 192    COAGS:  Recent Labs  02/27/17 0950  INR 1.11  APTT 35    BMP:  Recent Labs  02/13/17 1347  NA 143  K 3.4*  CO2 26  GLUCOSE 109  BUN 15.2  CALCIUM 10.2  CREATININE 0.9    LIVER FUNCTION TESTS:  Recent Labs  02/13/17 1347  BILITOT 0.62   AST 13  ALT 12  ALKPHOS 79  PROT 7.8  ALBUMIN 3.8    TUMOR MARKERS: No results for input(s): AFPTM, CEA, CA199, CHROMGRNA in the last 8760 hours.  Assessment and Plan: Patient with past medical history of breast cancer presents with squamous cell cancer of the right lung with plans to initiate chemotherapy.  IR consulted for Port-A-Cath placement at the request of Dr. Julien Nordmann. Patient presents for procedure today.  She has been NPO.  She does not take blood thinners and has been in her usual state of health.  Risks and Benefits discussed with the patient including, but not limited to bleeding, infection, pneumothorax, or fibrin sheath development and need for additional procedures. All of the patient's questions were answered, patient is agreeable to proceed. Consent signed and in chart.  Thank you for this interesting consult.  I greatly enjoyed meeting KURSTYN LARIOS and look forward to participating in their care.  A copy of this report was sent to the requesting provider on this date.  Electronically Signed: Docia Barrier 03/12/2017, 1:07 PM   I spent a total of    15 Minutes in face to face in clinical consultation, greater than 50% of which was counseling/coordinating care for lung cancer.

## 2017-03-13 ENCOUNTER — Telehealth: Payer: Self-pay | Admitting: *Deleted

## 2017-03-13 DIAGNOSIS — C3491 Malignant neoplasm of unspecified part of right bronchus or lung: Secondary | ICD-10-CM

## 2017-03-13 NOTE — Telephone Encounter (Signed)
Oncology Nurse Navigator Documentation  Oncology Nurse Navigator Flowsheets 03/13/2017  Navigator Location [facility]  Navigator Encounter Type Telephone/I called to check on Maria Washington's after her port placement.  She states she is doing well just a little sore.  I gave her an appt to be seen on 03/14/17 but she could not make it.  I gave her another appt on 03/17/17 labs at 3:15 and see Dr. Julien Nordmann at 3:45.  She verbalized understanding of appt time and place.   Telephone Outgoing Call  Treatment Phase Pre-Tx/Tx Discussion  Barriers/Navigation Needs Coordination of Care  Interventions Coordination of Care  Coordination of Care Appts  Acuity Level 2  Acuity Level 2 Assistance expediting appointments  Time Spent with Patient 30

## 2017-03-17 ENCOUNTER — Encounter: Payer: Self-pay | Admitting: Internal Medicine

## 2017-03-17 ENCOUNTER — Other Ambulatory Visit (HOSPITAL_BASED_OUTPATIENT_CLINIC_OR_DEPARTMENT_OTHER): Payer: Medicare Other

## 2017-03-17 ENCOUNTER — Ambulatory Visit (HOSPITAL_BASED_OUTPATIENT_CLINIC_OR_DEPARTMENT_OTHER): Payer: Medicare Other | Admitting: Internal Medicine

## 2017-03-17 ENCOUNTER — Telehealth: Payer: Self-pay | Admitting: Internal Medicine

## 2017-03-17 VITALS — BP 141/63 | HR 86 | Temp 98.0°F | Resp 18 | Ht 60.0 in | Wt 161.1 lb

## 2017-03-17 DIAGNOSIS — C3491 Malignant neoplasm of unspecified part of right bronchus or lung: Secondary | ICD-10-CM

## 2017-03-17 DIAGNOSIS — J449 Chronic obstructive pulmonary disease, unspecified: Secondary | ICD-10-CM

## 2017-03-17 DIAGNOSIS — C3411 Malignant neoplasm of upper lobe, right bronchus or lung: Secondary | ICD-10-CM | POA: Diagnosis not present

## 2017-03-17 DIAGNOSIS — I4891 Unspecified atrial fibrillation: Secondary | ICD-10-CM

## 2017-03-17 DIAGNOSIS — I509 Heart failure, unspecified: Secondary | ICD-10-CM

## 2017-03-17 DIAGNOSIS — Z7901 Long term (current) use of anticoagulants: Secondary | ICD-10-CM | POA: Diagnosis not present

## 2017-03-17 DIAGNOSIS — E039 Hypothyroidism, unspecified: Secondary | ICD-10-CM | POA: Diagnosis not present

## 2017-03-17 DIAGNOSIS — Z7189 Other specified counseling: Secondary | ICD-10-CM

## 2017-03-17 DIAGNOSIS — Z5111 Encounter for antineoplastic chemotherapy: Secondary | ICD-10-CM

## 2017-03-17 HISTORY — DX: Other specified counseling: Z71.89

## 2017-03-17 LAB — COMPREHENSIVE METABOLIC PANEL
ALT: 13 U/L (ref 0–55)
AST: 13 U/L (ref 5–34)
Albumin: 3.8 g/dL (ref 3.5–5.0)
Alkaline Phosphatase: 73 U/L (ref 40–150)
Anion Gap: 12 mEq/L — ABNORMAL HIGH (ref 3–11)
BUN: 18.7 mg/dL (ref 7.0–26.0)
CHLORIDE: 105 meq/L (ref 98–109)
CO2: 25 meq/L (ref 22–29)
Calcium: 10.2 mg/dL (ref 8.4–10.4)
Creatinine: 0.9 mg/dL (ref 0.6–1.1)
EGFR: 61 mL/min/{1.73_m2} — ABNORMAL LOW (ref >90)
GLUCOSE: 111 mg/dL (ref 70–140)
POTASSIUM: 3.5 meq/L (ref 3.5–5.1)
Sodium: 142 mEq/L (ref 136–145)
Total Bilirubin: 0.43 mg/dL (ref 0.20–1.20)
Total Protein: 7.7 g/dL (ref 6.4–8.3)

## 2017-03-17 LAB — CBC WITH DIFFERENTIAL/PLATELET
BASO%: 0.9 % (ref 0.0–2.0)
BASOS ABS: 0.1 10*3/uL (ref 0.0–0.1)
EOS%: 2.3 % (ref 0.0–7.0)
Eosinophils Absolute: 0.2 10*3/uL (ref 0.0–0.5)
HCT: 40.9 % (ref 34.8–46.6)
HGB: 13.8 g/dL (ref 11.6–15.9)
LYMPH%: 28.2 % (ref 14.0–49.7)
MCH: 30 pg (ref 25.1–34.0)
MCHC: 33.7 g/dL (ref 31.5–36.0)
MCV: 89.2 fL (ref 79.5–101.0)
MONO#: 0.8 10*3/uL (ref 0.1–0.9)
MONO%: 11 % (ref 0.0–14.0)
NEUT#: 4.1 10*3/uL (ref 1.5–6.5)
NEUT%: 57.6 % (ref 38.4–76.8)
Platelets: 239 10*3/uL (ref 145–400)
RBC: 4.59 10*6/uL (ref 3.70–5.45)
RDW: 14.6 % — ABNORMAL HIGH (ref 11.2–14.5)
WBC: 7.1 10*3/uL (ref 3.9–10.3)
lymph#: 2 10*3/uL (ref 0.9–3.3)

## 2017-03-17 NOTE — Telephone Encounter (Signed)
Message sent to [facility] to be added per requested date of  03/24/17 is capped. Chemo class scheduled for 03/20/17. Will schedule other chemo once 1st appointment has been confirmed.

## 2017-03-17 NOTE — Progress Notes (Signed)
[facility] [phone]   [phone]  OFFICE PROGRESS NOTE  Mathews Argyle, MD [address] [place] [address]  DIAGNOSIS: Stage IV (T2b, N3, M1 a) non-small cell lung cancer, squamous cell carcinoma presented with large right upper lobe lung mass in addition to bilateral mediastinal lymphadenopathy as well as left upper lobe lung nodule diagnosed in March 2018. PDL 1 expression is 5%.   PRIOR THERAPY: None  CURRENT THERAPY: Systemic chemotherapy with carboplatin for AUC of 5 and paclitaxel 175 MG/M2 every 3 weeks. First dose 03/24/2017.  INTERVAL HISTORY: Maria Washington 83 y.o. female returns to the clinic today for follow-up visit accompanied by her 2 daughters. The patient is feeling fine today with no specific complaints except for few episodes of cough in the morning mainly tickling in her throat with agitation and blood-tinged sputum. She denied having any chest pain but has shortness of breath with exertion. She denied having any fever or chills. She has no nausea, vomiting, diarrhea or constipation. She denied having any weight loss or night sweats. The molecular study for PDL 1 showed 5% expression. She is here today for evaluation and discussion of her treatment options based on the recent molecular studies.  MEDICAL HISTORY: Past Medical History:  Diagnosis Date  . Aortic atherosclerosis ([place]) 02/10/2017  . Arthritis   . CAD (coronary artery disease), native coronary artery    3 vessel calcification noted on CT scan   . Cardiac pacemaker in situ 12/05/2011  . Chronic diastolic congestive heart failure ([place])   . COPD (chronic obstructive pulmonary disease) ([facility])    spot on lung  . Encounter for antineoplastic chemotherapy 03/04/2017  . History of breast cancer    Treated with lumpectomy, radiation, and chemo sees Dr. Ralene Ok   . Hypertensive heart disease without CHF   . Hypothyroid   . Mitral regurgitation     . Paroxysmal atrial fibrillation ([facility])    CHA2DS2VASC score 5  . Second degree heart block   . Stage IV squamous cell carcinoma of right lung ([place]) 03/04/2017    ALLERGIES:  is allergic to clindamycin/lincomycin and penicillins.  MEDICATIONS:  Current Outpatient Prescriptions  Medication Sig Dispense Refill  . alendronate (FOSAMAX) 70 MG tablet Take 70 mg by mouth every 7 (seven) days. Monday    . apixaban (ELIQUIS) 5 MG TABS tablet Take 1 tablet (5 mg total) by mouth 2 (two) times daily. 60 tablet 3  . BIOTIN 5000 PO Take 5,000 mcg by mouth daily.     . calcium-vitamin D (OSCAL WITH D) 500-200 MG-UNIT per tablet Take 1 tablet by mouth 2 (two) times daily.      . cholecalciferol (VITAMIN D) 1000 units tablet Take 1 capsule by mouth daily.    Marland Kitchen diltiazem (CARDIZEM CD) 180 MG 24 hr capsule Take 180 mg by mouth daily.    . folic acid (FOLVITE) 1 MG tablet Take 1 mg by mouth daily.      . furosemide (LASIX) 40 MG tablet Take 40 mg by mouth daily.     Marland Kitchen levothyroxine (SYNTHROID, LEVOTHROID) 137 MCG tablet Take 137 mcg by mouth daily before breakfast.    . lidocaine-prilocaine (EMLA) cream Apply 1 application topically as needed. 30 g 0  . Omega-3 Fatty Acids (FISH OIL) 1200 MG CAPS Take 1,200 mg by mouth 2 (two) times daily.     . potassium chloride SA (K-DUR,KLOR-CON) 20 MEQ tablet Take 20-40 mEq by  mouth See admin instructions. 2 tabs in the morning, and 1 tab in the evening    . prochlorperazine (COMPAZINE) 10 MG tablet Take 1 tablet (10 mg total) by mouth every 6 (six) hours as needed for nausea or vomiting. 30 tablet 0  . vitamin B-12 (CYANOCOBALAMIN) 1000 MCG tablet Take 1,000 mcg by mouth daily.       No current facility-administered medications for this visit.     SURGICAL HISTORY:  Past Surgical History:  Procedure Laterality Date  . ABDOMINAL HYSTERECTOMY    . BLADDER SUSPENSION    . BREAST LUMPECTOMY     lumpectomy  . CARPAL TUNNEL RELEASE     left wrist  .  CHOLECYSTECTOMY    . FINGER SURGERY    . IR FLUORO GUIDE PORT INSERTION RIGHT  03/12/2017  . IR US GUIDE VASC ACCESS RIGHT  03/12/2017  . PERMANENT PACEMAKER INSERTION N/A 12/04/2011   Procedure: PERMANENT PACEMAKER INSERTION;  Surgeon: Evans Lance, MD;  Location: [facility] CATH LAB;  Service: Cardiovascular;  Laterality: N/A;  . SHOULDER SURGERY    . TONSILLECTOMY AND ADENOIDECTOMY      REVIEW OF SYSTEMS:  Constitutional: positive for fatigue Eyes: negative Ears, nose, mouth, throat, and face: negative Respiratory: positive for cough Cardiovascular: negative Gastrointestinal: negative Genitourinary:negative Integument/breast: negative Hematologic/lymphatic: negative Musculoskeletal:negative Neurological: negative Behavioral/Psych: negative Endocrine: negative Allergic/Immunologic: negative   PHYSICAL EXAMINATION: General appearance: alert, cooperative, fatigued and no distress Head: Normocephalic, without obvious abnormality, atraumatic Neck: no adenopathy, no JVD, supple, symmetrical, trachea midline and thyroid not enlarged, symmetric, no tenderness/mass/nodules Lymph nodes: Cervical, supraclavicular, and axillary nodes normal. Resp: clear to auscultation bilaterally Back: symmetric, no curvature. ROM normal. No CVA tenderness. Cardio: regular rate and rhythm, S1, S2 normal, no murmur, click, rub or gallop GI: soft, non-tender; bowel sounds normal; no masses,  no organomegaly Extremities: extremities normal, atraumatic, no cyanosis or edema Neurologic: Alert and oriented X 3, normal strength and tone. Normal symmetric reflexes. Normal coordination and gait  ECOG PERFORMANCE STATUS: 1 - Symptomatic but completely ambulatory  Blood pressure (!) 141/63, pulse 86, temperature 98 F (36.7 C), temperature source Oral, resp. rate 18, height 5' (1.524 m), weight 161 lb 1.6 oz (73.1 kg), SpO2 97 %.  LABORATORY DATA: Lab Results  Component Value Date   WBC 7.1 03/17/2017   HGB 13.8  03/17/2017   HCT 40.9 03/17/2017   MCV 89.2 03/17/2017   PLT 239 03/17/2017      Chemistry      Component Value Date/Time   NA 142 03/17/2017 1502   K 3.5 03/17/2017 1502   CL 99 (L) 09/29/2015 0544   CO2 25 03/17/2017 1502   BUN 18.7 03/17/2017 1502   CREATININE 0.9 03/17/2017 1502      Component Value Date/Time   CALCIUM 10.2 03/17/2017 1502   ALKPHOS 73 03/17/2017 1502   AST 13 03/17/2017 1502   ALT 13 03/17/2017 1502   BILITOT 0.43 03/17/2017 1502       RADIOGRAPHIC STUDIES: Ct Head W Wo Contrast  Result Date: 02/24/2017 CLINICAL DATA:  New diagnosis lung cancer.  Staging. EXAM: CT HEAD WITHOUT AND WITH CONTRAST TECHNIQUE: Contiguous axial images were obtained from the base of the skull through the vertex without and with intravenous contrast CONTRAST:  71m ISOVUE-300 IOPAMIDOL (ISOVUE-300) INJECTION 61% COMPARISON:  05/14/2014 FINDINGS: Brain: Mild age related atrophy. Mild chronic small-vessel ischemic changes of the white matter. No sign of acute infarction, mass lesion, hemorrhage, hydrocephalus or extra-axial collection. Vascular: There  is atherosclerotic calcification of the major vessels at the base of the brain. Skull: Normal Sinuses/Orbits: Clear/normal Other: None significant IMPRESSION: No evidence of metastatic disease. Atrophy and chronic small-vessel white matter disease. Electronically Signed   By: Nelson Chimes M.D.   On: 02/24/2017 16:20   Nm Pet Image Initial (pi) Skull Base To Thigh  Result Date: 02/26/2017 CLINICAL DATA:  Initial treatment strategy for right upper lobe lung mass. EXAM: NUCLEAR MEDICINE PET SKULL BASE TO THIGH TECHNIQUE: 8.0 mCi F-18 FDG was injected intravenously. Full-ring PET imaging was performed from the skull base to thigh after the radiotracer. CT data was obtained and used for attenuation correction and anatomic localization. FASTING BLOOD GLUCOSE:  Value: 142 mg/dl COMPARISON:  Chest CT on 02/07/2017 FINDINGS: NECK No hypermetabolic  lymph nodes in the neck. CHEST 5.1 cm mass in the posterior right upper lobe abutting the major fissure is hypermetabolic, with SUV max of 28.6. 2.3 cm pulmonary nodule in the central left upper lobe is also hypermetabolic, with SUV max of 14.9. Hypermetabolic mediastinal lymphadenopathy is seen in the lateral aortic region, with largest area measuring 3.0 cm in short axis on image 64/4, with SUV max of 13.7. A 10 mm right paratracheal lymph node on image 64/4 shows FDG uptake, with SUV max of 3.5. Asymmetric hypermetabolic activity is also seen within the right hilum, with SUV max of 5.1. ABDOMEN/PELVIS No abnormal hypermetabolic activity within the liver, pancreas, adrenal glands, or spleen. No hypermetabolic lymph nodes in the abdomen or pelvis. 5 cm simple appearing cyst again seen in upper pole of left kidney. Aortic atherosclerosis. Previous hysterectomy. SKELETON No focal hypermetabolic activity to suggest skeletal metastasis. IMPRESSION: Hypermetabolic 5.1 cm posterior right upper lobe mass and 2.3 cm left upper lobe pulmonary nodule, consistent with synchronous primary bronchogenic carcinomas. Hypermetabolic bilateral mediastinal and right hilar lymphadenopathy, consistent with metastatic disease. No evidence of metastatic disease within the neck, abdomen, or pelvis. Electronically Signed   By: Earle Gell M.D.   On: 02/26/2017 10:50   Ir US Guide Vasc Access Right  Result Date: 03/12/2017 INDICATION: 83 year old female with right-sided squamous cell carcinoma. EXAM: IMPLANTED PORT A CATH PLACEMENT WITH ULTRASOUND AND FLUOROSCOPIC GUIDANCE MEDICATIONS: Vancomycin 1 gm IV; The antibiotic was administered within an appropriate time interval prior to skin puncture. ANESTHESIA/SEDATION: Moderate (conscious) sedation was employed during this procedure. A total of Versed 2.0 mg and Fentanyl 75 mcg was administered intravenously. Moderate Sedation Time: 16 minutes. The patient's level of consciousness and vital  signs were monitored continuously by radiology nursing throughout the procedure under my direct supervision. FLUOROSCOPY TIME:  Zero minutes, 6 seconds (3 mGy) COMPLICATIONS: None PROCEDURE: The procedure, risks, benefits, and alternatives were explained to the patient. Questions regarding the procedure were encouraged and answered. The patient understands and consents to the procedure. Ultrasound survey was performed with images stored and sent to PACs. The catheterization neck and chest was prepped with chlorhexidine, and draped in the usual sterile fashion using maximum barrier technique (cap and mask, sterile gown, sterile gloves, large sterile sheet, hand hygiene and cutaneous antiseptic). Antibiotic prophylaxis was provided with 2.0g Ancef administered IV one hour prior to skin incision. Local anesthesia was attained by infiltration with 1% lidocaine without epinephrine. Ultrasound demonstrated patency of the right internal jugular vein, and this was documented with an image. Under real-time ultrasound guidance, this vein was accessed with a 21 gauge micropuncture needle and image documentation was performed. A small dermatotomy was made at the access site with an  11 scalpel. A 0.018" wire was advanced into the SVC and used to estimate the length of the internal catheter. The access needle exchanged for a 88F micropuncture vascular sheath. The 0.018" wire was then removed and a 0.035" wire advanced into the IVC. An appropriate location for the subcutaneous reservoir was selected below the clavicle and an incision was made through the skin and underlying soft tissues. The subcutaneous tissues were then dissected using a combination of blunt and sharp surgical technique and a pocket was formed. A single lumen power injectable portacatheter was then tunneled through the subcutaneous tissues from the pocket to the dermatotomy and the port reservoir placed within the subcutaneous pocket. The venous access site was  then serially dilated and a peel away vascular sheath placed over the wire. The wire was removed and the port catheter advanced into position under fluoroscopic guidance. The catheter tip is positioned in the cavoatrial junction. This was documented with a spot image. The portacatheter was then tested and found to flush and aspirate well. The port was flushed with saline followed by 100 units/mL heparinized saline. The pocket was then closed in two layers using first subdermal inverted interrupted absorbable sutures followed by a running subcuticular suture. The epidermis was then sealed with Dermabond. The dermatotomy at the venous access site was also seal with Dermabond. Patient tolerated the procedure well and remained hemodynamically stable throughout. No complications encountered and no significant blood loss encountered IMPRESSION: Status post right IJ port catheter placement. Catheter ready for use. Signed, Dulcy Fanny. Earleen Newport, DO [facility] Radiology Electronically Signed   By: Corrie Mckusick D.O.   On: 03/12/2017 15:48   Ct Biopsy  Result Date: 02/27/2017 INDICATION: 83 year old female with a history of FDG avid mass right upper lobe. EXAM: CT BIOPSY MEDICATIONS: None. ANESTHESIA/SEDATION: Moderate (conscious) sedation was employed during this procedure. A total of Versed 1.0 mg and Fentanyl 50 mcg was administered intravenously. Moderate Sedation Time: 15 minutes. The patient's level of consciousness and vital signs were monitored continuously by radiology nursing throughout the procedure under my direct supervision. FLUOROSCOPY TIME:  None COMPLICATIONS: None PROCEDURE: The procedure, risks, benefits, and alternatives were explained to the patient and the patient's family. Specific risks that were addressed included bleeding, infection, pneumothorax, need for further procedure including chest tube placement, chance of delayed pneumothorax or hemorrhage,  hemoptysis, nondiagnostic sample, cardiopulmonary collapse, death. Questions regarding the procedure were encouraged and answered. The patient understands and consents to the procedure. Patient was positioned in the right decubitus position on the CT gantry table and a scout CT of the chest was performed for planning purposes. Once angle of approach was determined, the skin and subcutaneous tissues this scan was prepped and draped in the usual sterile fashion, and a sterile drape was applied covering the operative field. A sterile gown and sterile gloves were used for the procedure. Local anesthesia was provided with 1% Lidocaine. The skin and subcutaneous tissues were infiltrated 1% lidocaine for local anesthesia, and a small stab incision was made with an 11 blade scalpel. Using CT guidance, a 17 gauge trocar needle was advanced into the right upper lobetarget. After confirmation of the tip, separate 18 gauge core biopsies were performed. These were placed into solution for transportation to the lab. A final CT image was performed. Patient tolerated the procedure well and remained hemodynamically stable throughout. No complications were encountered and no significant blood loss was encounter IMPRESSION: Status post CT-guided biopsy of right upper lobe  mass. Tissue specimen sent to pathology for complete histopathologic analysis. Signed, Dulcy Fanny. Earleen Newport, DO [facility] Radiology Electronically Signed   By: Corrie Mckusick D.O.   On: 02/27/2017 13:25   Dg Chest Port 1 View  Result Date: 02/27/2017 CLINICAL DATA:  83 year old female status post CT-guided right upper lobe lung biopsy today. EXAM: PORTABLE CHEST 1 VIEW COMPARISON:  CT-guided lung biopsy images 1227 hours today. Chest radiographs 12/04/2017 FINDINGS: Portable AP upright view at 1344 hours. Stable cardiac size and mediastinal contours. Medial right upper lung masslike opacity appears stable compared to  February. No pneumothorax. Increased pulmonary vascularity without overt edema. No pleural effusion or new pulmonary opacity. Stable left chest cardiac pacemaker. Stable left axillary surgical clips. IMPRESSION: No acute or adverse features status post CT-guided right upper lung mass biopsy today. Electronically Signed   By: Genevie Ann M.D.   On: 02/27/2017 14:35   Ir Fluoro Guide Port Insertion Right  Result Date: 03/12/2017 INDICATION: 83 year old female with right-sided squamous cell carcinoma. EXAM: IMPLANTED PORT A CATH PLACEMENT WITH ULTRASOUND AND FLUOROSCOPIC GUIDANCE MEDICATIONS: Vancomycin 1 gm IV; The antibiotic was administered within an appropriate time interval prior to skin puncture. ANESTHESIA/SEDATION: Moderate (conscious) sedation was employed during this procedure. A total of Versed 2.0 mg and Fentanyl 75 mcg was administered intravenously. Moderate Sedation Time: 16 minutes. The patient's level of consciousness and vital signs were monitored continuously by radiology nursing throughout the procedure under my direct supervision. FLUOROSCOPY TIME:  Zero minutes, 6 seconds (3 mGy) COMPLICATIONS: None PROCEDURE: The procedure, risks, benefits, and alternatives were explained to the patient. Questions regarding the procedure were encouraged and answered. The patient understands and consents to the procedure. Ultrasound survey was performed with images stored and sent to PACs. The catheterization neck and chest was prepped with chlorhexidine, and draped in the usual sterile fashion using maximum barrier technique (cap and mask, sterile gown, sterile gloves, large sterile sheet, hand hygiene and cutaneous antiseptic). Antibiotic prophylaxis was provided with 2.0g Ancef administered IV one hour prior to skin incision. Local anesthesia was attained by infiltration with 1% lidocaine without epinephrine. Ultrasound demonstrated patency of the right internal jugular vein, and this was documented with an  image. Under real-time ultrasound guidance, this vein was accessed with a 21 gauge micropuncture needle and image documentation was performed. A small dermatotomy was made at the access site with an 11 scalpel. A 0.018" wire was advanced into the SVC and used to estimate the length of the internal catheter. The access needle exchanged for a 39F micropuncture vascular sheath. The 0.018" wire was then removed and a 0.035" wire advanced into the IVC. An appropriate location for the subcutaneous reservoir was selected below the clavicle and an incision was made through the skin and underlying soft tissues. The subcutaneous tissues were then dissected using a combination of blunt and sharp surgical technique and a pocket was formed. A single lumen power injectable portacatheter was then tunneled through the subcutaneous tissues from the pocket to the dermatotomy and the port reservoir placed within the subcutaneous pocket. The venous access site was then serially dilated and a peel away vascular sheath placed over the wire. The wire was removed and the port catheter advanced into position under fluoroscopic guidance. The catheter tip is positioned in the cavoatrial junction. This was documented with a spot image. The portacatheter was then tested and found to flush and aspirate well. The port was flushed with saline followed by 100 units/mL heparinized  saline. The pocket was then closed in two layers using first subdermal inverted interrupted absorbable sutures followed by a running subcuticular suture. The epidermis was then sealed with Dermabond. The dermatotomy at the venous access site was also seal with Dermabond. Patient tolerated the procedure well and remained hemodynamically stable throughout. No complications encountered and no significant blood loss encountered IMPRESSION: Status post right IJ port catheter placement. Catheter ready for use. Signed, Dulcy Fanny. Earleen Newport, DO [facility] Radiology Electronically Signed   By: Corrie Mckusick D.O.   On: 03/12/2017 15:48    ASSESSMENT AND PLAN:  This is a very pleasant 83 years old white female with a stage IV non-small cell lung cancer, squamous cell carcinoma with PDL 1 expression 5% and presented with bilateral pulmonary masses in addition to mediastinal lymphadenopathy diagnosed in March 2018. I have a lengthy discussion with the patient and her daughters about her current condition and treatment options. Unfortunately the patient is not a good candidate for frontline immunotherapy with Nat Math (pembrolizumab) because of the low PDL 1 expression. I discussed with her other treatment options including systemic chemotherapy with reduced dose carboplatin for AUC of 5 and paclitaxel 175 MG/M2 every 3 weeks. I discussed with the patient adverse effect of this treatment including but not limited to alopecia, myelosuppression, nausea and vomiting, peripheral neuropathy, liver or renal dysfunction. She was also given him the option of palliative care and hospice but the patient is interested in treatment. She is expected to start the first cycle of this treatment on 03/24/2017. I will arrange for the patient to have a chemotherapy education class before the first dose of her treatment. She will come back for follow-up visit in 2 weeks for evaluation and management of any adverse effect of her treatment. For the congestive heart failure the patient will continue treatment with Cardizem and Lasix. For the hypothyroidism, she will continue treatment with Synthroid. For the atrial fibrillation, she will continue her current treatment with Eliquis and Cardizem. The patient was advised to call immediately if she has any concerning symptoms in the interval. The patient voices understanding of current disease status and treatment options and is in agreement with the current care plan. All questions were answered. The patient  knows to call the clinic with any problems, questions or concerns. We can certainly see the patient much sooner if necessary. Disclaimer: This note was dictated with voice recognition software. Similar sounding words can inadvertently be transcribed and may not be corrected upon review.

## 2017-03-17 NOTE — Progress Notes (Signed)
START ON PATHWAY REGIMEN - Non-Small Cell Lung     A cycle is every 21 days:     Paclitaxel      Carboplatin   **Always confirm dose/schedule in your pharmacy ordering system**    Patient Characteristics: Stage IV Metastatic, Squamous, PS = 0, 1, First Line, PD-L1 Expression Positive 1-49% (TPS) / Negative / Not Tested / Not a Candidate for Immunotherapy AJCC T Category: T2b Current Disease Status: Distant Metastases AJCC N Category: N3 AJCC M Category: M1a AJCC 8 Stage Grouping: IVA Histology: Squamous Cell Line of therapy: First Line PD-L1 Expression Status: PD-L1 Positive 1-49% (TPS) Performance Status: PS = 0, 1 Would you be surprised if this patient died  in the next year? I would NOT be surprised if this patient died in the next year  Intent of Therapy: Non-Curative / Palliative Intent, Discussed with Patient

## 2017-03-19 ENCOUNTER — Encounter: Payer: Self-pay | Admitting: *Deleted

## 2017-03-20 ENCOUNTER — Other Ambulatory Visit: Payer: Medicare Other

## 2017-03-21 ENCOUNTER — Telehealth: Payer: Self-pay | Admitting: Internal Medicine

## 2017-03-21 NOTE — Telephone Encounter (Signed)
Called to confirm appt with patient for 4/20. Patient aware and will pick up new schedule the next time she comes in .

## 2017-03-24 ENCOUNTER — Telehealth: Payer: Self-pay | Admitting: Internal Medicine

## 2017-03-24 NOTE — Telephone Encounter (Signed)
Scheduled additional appts per 4/11 los. Patient to receive new schedule next visit.

## 2017-03-28 ENCOUNTER — Other Ambulatory Visit: Payer: Self-pay | Admitting: *Deleted

## 2017-03-28 ENCOUNTER — Telehealth: Payer: Self-pay | Admitting: Internal Medicine

## 2017-03-28 ENCOUNTER — Ambulatory Visit (HOSPITAL_BASED_OUTPATIENT_CLINIC_OR_DEPARTMENT_OTHER): Payer: Medicare Other

## 2017-03-28 ENCOUNTER — Ambulatory Visit: Payer: Medicare Other

## 2017-03-28 ENCOUNTER — Other Ambulatory Visit (HOSPITAL_BASED_OUTPATIENT_CLINIC_OR_DEPARTMENT_OTHER): Payer: Medicare Other

## 2017-03-28 VITALS — BP 157/74 | HR 68 | Temp 97.5°F | Resp 18

## 2017-03-28 DIAGNOSIS — C3411 Malignant neoplasm of upper lobe, right bronchus or lung: Secondary | ICD-10-CM | POA: Diagnosis not present

## 2017-03-28 DIAGNOSIS — C3491 Malignant neoplasm of unspecified part of right bronchus or lung: Secondary | ICD-10-CM

## 2017-03-28 DIAGNOSIS — Z5111 Encounter for antineoplastic chemotherapy: Secondary | ICD-10-CM

## 2017-03-28 LAB — CBC WITH DIFFERENTIAL/PLATELET
BASO%: 0.7 % (ref 0.0–2.0)
Basophils Absolute: 0.1 10*3/uL (ref 0.0–0.1)
EOS ABS: 0.3 10*3/uL (ref 0.0–0.5)
EOS%: 3.4 % (ref 0.0–7.0)
HEMATOCRIT: 37.4 % (ref 34.8–46.6)
HEMOGLOBIN: 12.3 g/dL (ref 11.6–15.9)
LYMPH#: 1.6 10*3/uL (ref 0.9–3.3)
LYMPH%: 21.5 % (ref 14.0–49.7)
MCH: 29.4 pg (ref 25.1–34.0)
MCHC: 32.9 g/dL (ref 31.5–36.0)
MCV: 89.5 fL (ref 79.5–101.0)
MONO#: 0.8 10*3/uL (ref 0.1–0.9)
MONO%: 10.6 % (ref 0.0–14.0)
NEUT#: 4.8 10*3/uL (ref 1.5–6.5)
NEUT%: 63.8 % (ref 38.4–76.8)
Platelets: 197 10*3/uL (ref 145–400)
RBC: 4.18 10*6/uL (ref 3.70–5.45)
RDW: 14.5 % (ref 11.2–14.5)
WBC: 7.4 10*3/uL (ref 3.9–10.3)

## 2017-03-28 LAB — COMPREHENSIVE METABOLIC PANEL
ALBUMIN: 3.5 g/dL (ref 3.5–5.0)
ALK PHOS: 75 U/L (ref 40–150)
ALT: 10 U/L (ref 0–55)
AST: 12 U/L (ref 5–34)
Anion Gap: 12 mEq/L — ABNORMAL HIGH (ref 3–11)
BUN: 17.6 mg/dL (ref 7.0–26.0)
CALCIUM: 9.8 mg/dL (ref 8.4–10.4)
CO2: 27 mEq/L (ref 22–29)
Chloride: 104 mEq/L (ref 98–109)
Creatinine: 1 mg/dL (ref 0.6–1.1)
EGFR: 50 mL/min/{1.73_m2} — ABNORMAL LOW (ref >90)
Glucose: 97 mg/dl (ref 70–140)
POTASSIUM: 3.4 meq/L — AB (ref 3.5–5.1)
Sodium: 143 mEq/L (ref 136–145)
Total Bilirubin: 0.36 mg/dL (ref 0.20–1.20)
Total Protein: 7.4 g/dL (ref 6.4–8.3)

## 2017-03-28 MED ORDER — PALONOSETRON HCL INJECTION 0.25 MG/5ML
0.2500 mg | Freq: Once | INTRAVENOUS | Status: AC
  Administered 2017-03-28: 0.25 mg via INTRAVENOUS

## 2017-03-28 MED ORDER — SODIUM CHLORIDE 0.9% FLUSH
10.0000 mL | INTRAVENOUS | Status: DC | PRN
  Administered 2017-03-28: 10 mL
  Filled 2017-03-28: qty 10

## 2017-03-28 MED ORDER — SODIUM CHLORIDE 0.9 % IV SOLN
358.0000 mg | Freq: Once | INTRAVENOUS | Status: AC
  Administered 2017-03-28: 360 mg via INTRAVENOUS
  Filled 2017-03-28: qty 36

## 2017-03-28 MED ORDER — FAMOTIDINE IN NACL 20-0.9 MG/50ML-% IV SOLN
20.0000 mg | Freq: Once | INTRAVENOUS | Status: AC
  Administered 2017-03-28: 20 mg via INTRAVENOUS

## 2017-03-28 MED ORDER — DIPHENHYDRAMINE HCL 50 MG/ML IJ SOLN
INTRAMUSCULAR | Status: AC
  Filled 2017-03-28: qty 1

## 2017-03-28 MED ORDER — SODIUM CHLORIDE 0.9 % IV SOLN
Freq: Once | INTRAVENOUS | Status: AC
  Administered 2017-03-28: 11:00:00 via INTRAVENOUS

## 2017-03-28 MED ORDER — FAMOTIDINE IN NACL 20-0.9 MG/50ML-% IV SOLN
INTRAVENOUS | Status: AC
  Filled 2017-03-28: qty 50

## 2017-03-28 MED ORDER — DEXAMETHASONE SODIUM PHOSPHATE 100 MG/10ML IJ SOLN
20.0000 mg | Freq: Once | INTRAMUSCULAR | Status: AC
  Administered 2017-03-28: 20 mg via INTRAVENOUS
  Filled 2017-03-28: qty 2

## 2017-03-28 MED ORDER — DEXTROSE 5 % IV SOLN
175.0000 mg/m2 | Freq: Once | INTRAVENOUS | Status: AC
  Administered 2017-03-28: 306 mg via INTRAVENOUS
  Filled 2017-03-28: qty 51

## 2017-03-28 MED ORDER — PALONOSETRON HCL INJECTION 0.25 MG/5ML
INTRAVENOUS | Status: AC
  Filled 2017-03-28: qty 5

## 2017-03-28 MED ORDER — HEPARIN SOD (PORK) LOCK FLUSH 100 UNIT/ML IV SOLN
500.0000 [IU] | Freq: Once | INTRAVENOUS | Status: AC | PRN
  Administered 2017-03-28: 500 [IU]
  Filled 2017-03-28: qty 5

## 2017-03-28 MED ORDER — DIPHENHYDRAMINE HCL 50 MG/ML IJ SOLN
50.0000 mg | Freq: Once | INTRAMUSCULAR | Status: AC
  Administered 2017-03-28: 50 mg via INTRAVENOUS

## 2017-03-28 NOTE — Telephone Encounter (Signed)
Patient's daughter came to scheduling during treatment to retrieve patient scheduled appointments up to June.

## 2017-03-28 NOTE — Patient Instructions (Signed)

## 2017-03-28 NOTE — Patient Instructions (Addendum)
[facility] Discharge Instructions for Patients Receiving Chemotherapy  Today you received the following chemotherapy agents: Taxol and Carboplatin.  To help prevent nausea and vomiting after your treatment, we encourage you to take your nausea medication: Prochlorperazine (Compazine) 10 mg every 6 hours as needed for nausea.  If you develop nausea and vomiting that is not controlled by your nausea medication, call the clinic.   BELOW ARE SYMPTOMS THAT SHOULD BE REPORTED IMMEDIATELY:  *FEVER GREATER THAN 100.5 F  *CHILLS WITH OR WITHOUT FEVER  NAUSEA AND VOMITING THAT IS NOT CONTROLLED WITH YOUR NAUSEA MEDICATION  *UNUSUAL SHORTNESS OF BREATH  *UNUSUAL BRUISING OR BLEEDING  TENDERNESS IN MOUTH AND THROAT WITH OR WITHOUT PRESENCE OF ULCERS  *URINARY PROBLEMS  *BOWEL PROBLEMS  UNUSUAL RASH Items with * indicate a potential emergency and should be followed up as soon as possible.  Feel free to call the clinic you have any questions or concerns. The clinic phone number is [phone].  Please show the [facility] at check-in to the Emergency Department and triage nurse.  Paclitaxel injection What is this medicine? PACLITAXEL (PAK li TAX el) is a chemotherapy drug. It targets fast dividing cells, like cancer cells, and causes these cells to die. This medicine is used to treat ovarian cancer, breast cancer, and other cancers. This medicine may be used for other purposes; ask your health care provider or pharmacist if you have questions. COMMON BRAND NAME(S): Onxol, Taxol What should I tell my health care provider before I take this medicine? They need to know if you have any of these conditions: -blood disorders -irregular heartbeat -infection (especially a virus infection such as chickenpox, cold sores, or herpes) -liver disease -previous or ongoing radiation therapy -an unusual or allergic reaction to paclitaxel, alcohol, polyoxyethylated castor oil,  other chemotherapy agents, other medicines, foods, dyes, or preservatives -pregnant or trying to get pregnant -breast-feeding How should I use this medicine? This drug is given as an infusion into a vein. It is administered in a hospital or clinic by a specially trained health care professional. Talk to your pediatrician regarding the use of this medicine in children. Special care may be needed. Overdosage: If you think you have taken too much of this medicine contact a poison control center or emergency room at once. NOTE: This medicine is only for you. Do not share this medicine with others. What if I miss a dose? It is important not to miss your dose. Call your doctor or health care professional if you are unable to keep an appointment. What may interact with this medicine? Do not take this medicine with any of the following medications: -disulfiram -metronidazole This medicine may also interact with the following medications: -cyclosporine -diazepam -ketoconazole -medicines to increase blood counts like filgrastim, pegfilgrastim, sargramostim -other chemotherapy drugs like cisplatin, doxorubicin, epirubicin, etoposide, teniposide, vincristine -quinidine -testosterone -vaccines -verapamil Talk to your doctor or health care professional before taking any of these medicines: -acetaminophen -aspirin -ibuprofen -ketoprofen -naproxen This list may not describe all possible interactions. Give your health care provider a list of all the medicines, herbs, non-prescription drugs, or dietary supplements you use. Also tell them if you smoke, drink alcohol, or use illegal drugs. Some items may interact with your medicine. What should I watch for while using this medicine? Your condition will be monitored carefully while you are receiving this medicine. You will need important blood work done while you are taking this medicine. This medicine can cause serious allergic reactions.  To reduce your  risk you will need to take other medicine(s) before treatment with this medicine. If you experience allergic reactions like skin rash, itching or hives, swelling of the face, lips, or tongue, tell your doctor or health care professional right away. In some cases, you may be given additional medicines to help with side effects. Follow all directions for their use. This drug may make you feel generally unwell. This is not uncommon, as chemotherapy can affect healthy cells as well as cancer cells. Report any side effects. Continue your course of treatment even though you feel ill unless your doctor tells you to stop. Call your doctor or health care professional for advice if you get a fever, chills or sore throat, or other symptoms of a cold or flu. Do not treat yourself. This drug decreases your body's ability to fight infections. Try to avoid being around people who are sick. This medicine may increase your risk to bruise or bleed. Call your doctor or health care professional if you notice any unusual bleeding. Be careful brushing and flossing your teeth or using a toothpick because you may get an infection or bleed more easily. If you have any dental work done, tell your dentist you are receiving this medicine. Avoid taking products that contain aspirin, acetaminophen, ibuprofen, naproxen, or ketoprofen unless instructed by your doctor. These medicines may hide a fever. Do not become pregnant while taking this medicine. Women should inform their doctor if they wish to become pregnant or think they might be pregnant. There is a potential for serious side effects to an unborn child. Talk to your health care professional or pharmacist for more information. Do not breast-feed an infant while taking this medicine. Men are advised not to father a child while receiving this medicine. This product may contain alcohol. Ask your pharmacist or healthcare provider if this medicine contains alcohol. Be sure to tell all  healthcare providers you are taking this medicine. Certain medicines, like metronidazole and disulfiram, can cause an unpleasant reaction when taken with alcohol. The reaction includes flushing, headache, nausea, vomiting, sweating, and increased thirst. The reaction can last from 30 minutes to several hours. What side effects may I notice from receiving this medicine? Side effects that you should report to your doctor or health care professional as soon as possible: -allergic reactions like skin rash, itching or hives, swelling of the face, lips, or tongue -low blood counts - This drug may decrease the number of white blood cells, red blood cells and platelets. You may be at increased risk for infections and bleeding. -signs of infection - fever or chills, cough, sore throat, pain or difficulty passing urine -signs of decreased platelets or bleeding - bruising, pinpoint red spots on the skin, black, tarry stools, nosebleeds -signs of decreased red blood cells - unusually weak or tired, fainting spells, lightheadedness -breathing problems -chest pain -high or low blood pressure -mouth sores -nausea and vomiting -pain, swelling, redness or irritation at the injection site -pain, tingling, numbness in the hands or feet -slow or irregular heartbeat -swelling of the ankle, feet, hands Side effects that usually do not require medical attention (report to your doctor or health care professional if they continue or are bothersome): -bone pain -complete hair loss including hair on your head, underarms, pubic hair, eyebrows, and eyelashes -changes in the color of fingernails -diarrhea -loosening of the fingernails -loss of appetite -muscle or joint pain -red flush to skin -sweating This list may not describe all possible side  effects. Call your doctor for medical advice about side effects. You may report side effects to FDA at 1-800-FDA-1088. Where should I keep my medicine? This drug is given in  a hospital or clinic and will not be stored at home. NOTE: This sheet is a summary. It may not cover all possible information. If you have questions about this medicine, talk to your doctor, pharmacist, or health care provider.  2018 [employer] Standard (2015-09-26 19:58:00)  Carboplatin injection What is this medicine? CARBOPLATIN (KAR boe pla tin) is a chemotherapy drug. It targets fast dividing cells, like cancer cells, and causes these cells to die. This medicine is used to treat ovarian cancer and many other cancers. This medicine may be used for other purposes; ask your health care provider or pharmacist if you have questions. COMMON BRAND NAME(S): Paraplatin What should I tell my health care provider before I take this medicine? They need to know if you have any of these conditions: -blood disorders -hearing problems -kidney disease -recent or ongoing radiation therapy -an unusual or allergic reaction to carboplatin, cisplatin, other chemotherapy, other medicines, foods, dyes, or preservatives -pregnant or trying to get pregnant -breast-feeding How should I use this medicine? This drug is usually given as an infusion into a vein. It is administered in a hospital or clinic by a specially trained health care professional. Talk to your pediatrician regarding the use of this medicine in children. Special care may be needed. Overdosage: If you think you have taken too much of this medicine contact a poison control center or emergency room at once. NOTE: This medicine is only for you. Do not share this medicine with others. What if I miss a dose? It is important not to miss a dose. Call your doctor or health care professional if you are unable to keep an appointment. What may interact with this medicine? -medicines for seizures -medicines to increase blood counts like filgrastim, pegfilgrastim, sargramostim -some antibiotics like amikacin, gentamicin, neomycin, streptomycin,  tobramycin -vaccines Talk to your doctor or health care professional before taking any of these medicines: -acetaminophen -aspirin -ibuprofen -ketoprofen -naproxen This list may not describe all possible interactions. Give your health care provider a list of all the medicines, herbs, non-prescription drugs, or dietary supplements you use. Also tell them if you smoke, drink alcohol, or use illegal drugs. Some items may interact with your medicine. What should I watch for while using this medicine? Your condition will be monitored carefully while you are receiving this medicine. You will need important blood work done while you are taking this medicine. This drug may make you feel generally unwell. This is not uncommon, as chemotherapy can affect healthy cells as well as cancer cells. Report any side effects. Continue your course of treatment even though you feel ill unless your doctor tells you to stop. In some cases, you may be given additional medicines to help with side effects. Follow all directions for their use. Call your doctor or health care professional for advice if you get a fever, chills or sore throat, or other symptoms of a cold or flu. Do not treat yourself. This drug decreases your body's ability to fight infections. Try to avoid being around people who are sick. This medicine may increase your risk to bruise or bleed. Call your doctor or health care professional if you notice any unusual bleeding. Be careful brushing and flossing your teeth or using a toothpick because you may get an infection or bleed more easily. If you have  any dental work done, tell your dentist you are receiving this medicine. Avoid taking products that contain aspirin, acetaminophen, ibuprofen, naproxen, or ketoprofen unless instructed by your doctor. These medicines may hide a fever. Do not become pregnant while taking this medicine. Women should inform their doctor if they wish to become pregnant or think  they might be pregnant. There is a potential for serious side effects to an unborn child. Talk to your health care professional or pharmacist for more information. Do not breast-feed an infant while taking this medicine. What side effects may I notice from receiving this medicine? Side effects that you should report to your doctor or health care professional as soon as possible: -allergic reactions like skin rash, itching or hives, swelling of the face, lips, or tongue -signs of infection - fever or chills, cough, sore throat, pain or difficulty passing urine -signs of decreased platelets or bleeding - bruising, pinpoint red spots on the skin, black, tarry stools, nosebleeds -signs of decreased red blood cells - unusually weak or tired, fainting spells, lightheadedness -breathing problems -changes in hearing -changes in vision -chest pain -high blood pressure -low blood counts - This drug may decrease the number of white blood cells, red blood cells and platelets. You may be at increased risk for infections and bleeding. -nausea and vomiting -pain, swelling, redness or irritation at the injection site -pain, tingling, numbness in the hands or feet -problems with balance, talking, walking -trouble passing urine or change in the amount of urine Side effects that usually do not require medical attention (report to your doctor or health care professional if they continue or are bothersome): -hair loss -loss of appetite -metallic taste in the mouth or changes in taste This list may not describe all possible side effects. Call your doctor for medical advice about side effects. You may report side effects to FDA at 1-800-FDA-1088. Where should I keep my medicine? This drug is given in a hospital or clinic and will not be stored at home. NOTE: This sheet is a summary. It may not cover all possible information. If you have questions about this medicine, talk to your doctor, pharmacist, or health care  provider.  2018 [employer] Standard (2008-03-01 14:38:05)

## 2017-03-28 NOTE — Telephone Encounter (Signed)
Patient's daughter came to scheduling to have flush appointments added to appointments.

## 2017-03-31 ENCOUNTER — Telehealth: Payer: Self-pay | Admitting: Medical Oncology

## 2017-03-31 NOTE — Telephone Encounter (Signed)
"   I m doing okay. I am a little weak . She ate a sausage biscuit , protein drink and water today. She is not sleeping. I told her to increase water to 64 oz a day and to call back if symptoms do not resolve in the next 24 hours.

## 2017-04-04 ENCOUNTER — Other Ambulatory Visit: Payer: Self-pay | Admitting: Medical Oncology

## 2017-04-04 ENCOUNTER — Other Ambulatory Visit: Payer: Medicare Other

## 2017-04-04 DIAGNOSIS — C3491 Malignant neoplasm of unspecified part of right bronchus or lung: Secondary | ICD-10-CM

## 2017-04-07 ENCOUNTER — Ambulatory Visit (HOSPITAL_BASED_OUTPATIENT_CLINIC_OR_DEPARTMENT_OTHER): Payer: Medicare Other

## 2017-04-07 ENCOUNTER — Other Ambulatory Visit (HOSPITAL_BASED_OUTPATIENT_CLINIC_OR_DEPARTMENT_OTHER): Payer: Medicare Other

## 2017-04-07 ENCOUNTER — Ambulatory Visit (HOSPITAL_BASED_OUTPATIENT_CLINIC_OR_DEPARTMENT_OTHER): Payer: Medicare Other | Admitting: Internal Medicine

## 2017-04-07 ENCOUNTER — Other Ambulatory Visit: Payer: Medicare Other

## 2017-04-07 ENCOUNTER — Encounter: Payer: Self-pay | Admitting: Internal Medicine

## 2017-04-07 VITALS — BP 148/80 | HR 88 | Temp 97.7°F | Resp 18 | Ht 60.0 in | Wt 159.3 lb

## 2017-04-07 DIAGNOSIS — C3411 Malignant neoplasm of upper lobe, right bronchus or lung: Secondary | ICD-10-CM

## 2017-04-07 DIAGNOSIS — C3491 Malignant neoplasm of unspecified part of right bronchus or lung: Secondary | ICD-10-CM

## 2017-04-07 DIAGNOSIS — Z452 Encounter for adjustment and management of vascular access device: Secondary | ICD-10-CM | POA: Diagnosis not present

## 2017-04-07 DIAGNOSIS — G4700 Insomnia, unspecified: Secondary | ICD-10-CM | POA: Diagnosis not present

## 2017-04-07 DIAGNOSIS — R197 Diarrhea, unspecified: Secondary | ICD-10-CM | POA: Diagnosis not present

## 2017-04-07 DIAGNOSIS — Z95828 Presence of other vascular implants and grafts: Secondary | ICD-10-CM

## 2017-04-07 DIAGNOSIS — Z5111 Encounter for antineoplastic chemotherapy: Secondary | ICD-10-CM

## 2017-04-07 LAB — CBC WITH DIFFERENTIAL/PLATELET
BASO%: 2.9 % — AB (ref 0.0–2.0)
BASOS ABS: 0.1 10*3/uL (ref 0.0–0.1)
EOS%: 5.4 % (ref 0.0–7.0)
Eosinophils Absolute: 0.2 10*3/uL (ref 0.0–0.5)
HEMATOCRIT: 34.6 % — AB (ref 34.8–46.6)
HEMOGLOBIN: 11.6 g/dL (ref 11.6–15.9)
LYMPH#: 1.2 10*3/uL (ref 0.9–3.3)
LYMPH%: 40.2 % (ref 14.0–49.7)
MCH: 29.7 pg (ref 25.1–34.0)
MCHC: 33.5 g/dL (ref 31.5–36.0)
MCV: 88.7 fL (ref 79.5–101.0)
MONO#: 0.3 10*3/uL (ref 0.1–0.9)
MONO%: 10.5 % (ref 0.0–14.0)
NEUT#: 1.2 10*3/uL — ABNORMAL LOW (ref 1.5–6.5)
NEUT%: 41 % (ref 38.4–76.8)
Platelets: 170 10*3/uL (ref 145–400)
RBC: 3.9 10*6/uL (ref 3.70–5.45)
RDW: 14.4 % (ref 11.2–14.5)
WBC: 3 10*3/uL — AB (ref 3.9–10.3)

## 2017-04-07 LAB — COMPREHENSIVE METABOLIC PANEL
ALBUMIN: 3.6 g/dL (ref 3.5–5.0)
ALT: 15 U/L (ref 0–55)
AST: 14 U/L (ref 5–34)
Alkaline Phosphatase: 68 U/L (ref 40–150)
Anion Gap: 10 mEq/L (ref 3–11)
BILIRUBIN TOTAL: 0.33 mg/dL (ref 0.20–1.20)
BUN: 14.3 mg/dL (ref 7.0–26.0)
CO2: 23 meq/L (ref 22–29)
CREATININE: 0.8 mg/dL (ref 0.6–1.1)
Calcium: 9.1 mg/dL (ref 8.4–10.4)
Chloride: 111 mEq/L — ABNORMAL HIGH (ref 98–109)
EGFR: 65 mL/min/{1.73_m2} — ABNORMAL LOW (ref >90)
Glucose: 113 mg/dl (ref 70–140)
Potassium: 3.5 mEq/L (ref 3.5–5.1)
SODIUM: 144 meq/L (ref 136–145)
TOTAL PROTEIN: 6.9 g/dL (ref 6.4–8.3)

## 2017-04-07 MED ORDER — SODIUM CHLORIDE 0.9% FLUSH
10.0000 mL | Freq: Once | INTRAVENOUS | Status: AC
  Administered 2017-04-07: 10 mL
  Filled 2017-04-07: qty 10

## 2017-04-07 MED ORDER — HEPARIN SOD (PORK) LOCK FLUSH 100 UNIT/ML IV SOLN
500.0000 [IU] | Freq: Once | INTRAVENOUS | Status: AC
  Administered 2017-04-07: 500 [IU]
  Filled 2017-04-07: qty 5

## 2017-04-07 NOTE — Progress Notes (Signed)
[place] [phone]   [phone]  OFFICE PROGRESS NOTE  Mathews Argyle, MD [address] [place] [address]  DIAGNOSIS: Stage IV (T2b, N3, M1 a) non-small cell lung cancer, squamous cell carcinoma presented with large right upper lobe lung mass in addition to bilateral mediastinal lymphadenopathy as well as left upper lobe lung nodule diagnosed in March 2018. PDL 1 expression is 5%.   PRIOR THERAPY: None  CURRENT THERAPY: Systemic chemotherapy with carboplatin for AUC of 5 and paclitaxel 175 MG/M2 every 3 weeks. First dose 03/24/2017. Status post one cycle.  INTERVAL HISTORY: LOVELYN SHEERAN 83 y.o. female returns to the clinic today for follow-up visit accompanied by her daughter. The patient receive the first cycle of her systemic chemotherapy last week. She tolerated it well except for increasing fatigue and weakness. She also has few episodes of diarrhea after the treatment. She denied having any nausea or vomiting. She has occasional cough with blood-tinged sputum mainly in the morning. She denied having any chest pain or shortness of breath. She has no fever or chills. She is here today for evaluation and repeat blood work.  MEDICAL HISTORY: Past Medical History:  Diagnosis Date  . Aortic atherosclerosis ([facility]) 02/10/2017  . Arthritis   . CAD (coronary artery disease), native coronary artery    3 vessel calcification noted on CT scan   . Cardiac pacemaker in situ 12/05/2011  . Chronic diastolic congestive heart failure ([place])   . COPD (chronic obstructive pulmonary disease) ([facility])    spot on lung  . Encounter for antineoplastic chemotherapy 03/04/2017  . Goals of care, counseling/discussion 03/17/2017  . History of breast cancer    Treated with lumpectomy, radiation, and chemo sees Dr. Ralene Ok   . Hypertensive heart disease without CHF   . Hypothyroid   . Mitral regurgitation   . Paroxysmal atrial fibrillation ([facility])     CHA2DS2VASC score 5  . Second degree heart block   . Stage IV squamous cell carcinoma of right lung ([place]) 03/04/2017    ALLERGIES:  is allergic to clindamycin/lincomycin and penicillins.  MEDICATIONS:  Current Outpatient Prescriptions  Medication Sig Dispense Refill  . alendronate (FOSAMAX) 70 MG tablet Take 70 mg by mouth every 7 (seven) days. Monday    . apixaban (ELIQUIS) 5 MG TABS tablet Take 1 tablet (5 mg total) by mouth 2 (two) times daily. 60 tablet 3  . BIOTIN 5000 PO Take 5,000 mcg by mouth daily.     . calcium-vitamin D (OSCAL WITH D) 500-200 MG-UNIT per tablet Take 1 tablet by mouth 2 (two) times daily.      . cholecalciferol (VITAMIN D) 1000 units tablet Take 1 capsule by mouth daily.    Marland Kitchen diltiazem (CARDIZEM CD) 180 MG 24 hr capsule Take 180 mg by mouth daily.    . folic acid (FOLVITE) 1 MG tablet Take 1 mg by mouth daily.      . furosemide (LASIX) 40 MG tablet Take 40 mg by mouth daily.     Marland Kitchen levothyroxine (SYNTHROID, LEVOTHROID) 137 MCG tablet Take 137 mcg by mouth daily before breakfast.    . lidocaine-prilocaine (EMLA) cream Apply 1 application topically as needed. 30 g 0  . Omega-3 Fatty Acids (FISH OIL) 1200 MG CAPS Take 1,200 mg by mouth 2 (two) times daily.     . potassium chloride SA (K-DUR,KLOR-CON) 20 MEQ tablet Take 20-40 mEq by mouth See admin instructions. 2 tabs in the  morning, and 1 tab in the evening    . prochlorperazine (COMPAZINE) 10 MG tablet Take 1 tablet (10 mg total) by mouth every 6 (six) hours as needed for nausea or vomiting. 30 tablet 0  . vitamin B-12 (CYANOCOBALAMIN) 1000 MCG tablet Take 1,000 mcg by mouth daily.       No current facility-administered medications for this visit.     SURGICAL HISTORY:  Past Surgical History:  Procedure Laterality Date  . ABDOMINAL HYSTERECTOMY    . BLADDER SUSPENSION    . BREAST LUMPECTOMY     lumpectomy  . CARPAL TUNNEL RELEASE     left wrist  . CHOLECYSTECTOMY    . FINGER SURGERY    . IR FLUORO  GUIDE PORT INSERTION RIGHT  03/12/2017  . IR US GUIDE VASC ACCESS RIGHT  03/12/2017  . PERMANENT PACEMAKER INSERTION N/A 12/04/2011   Procedure: PERMANENT PACEMAKER INSERTION;  Surgeon: Evans Lance, MD;  Location: [facility] CATH LAB;  Service: Cardiovascular;  Laterality: N/A;  . SHOULDER SURGERY    . TONSILLECTOMY AND ADENOIDECTOMY      REVIEW OF SYSTEMS:  A comprehensive review of systems was negative except for: Constitutional: positive for fatigue Respiratory: positive for cough, hemoptysis and sputum Musculoskeletal: positive for muscle weakness   PHYSICAL EXAMINATION: General appearance: alert, cooperative, fatigued and no distress Head: Normocephalic, without obvious abnormality, atraumatic Neck: no adenopathy, no JVD, supple, symmetrical, trachea midline and thyroid not enlarged, symmetric, no tenderness/mass/nodules Lymph nodes: Cervical, supraclavicular, and axillary nodes normal. Resp: clear to auscultation bilaterally Back: symmetric, no curvature. ROM normal. No CVA tenderness. Cardio: regular rate and rhythm, S1, S2 normal, no murmur, click, rub or gallop GI: soft, non-tender; bowel sounds normal; no masses,  no organomegaly Extremities: extremities normal, atraumatic, no cyanosis or edema  ECOG PERFORMANCE STATUS: 1 - Symptomatic but completely ambulatory  Blood pressure (!) 148/80, pulse 88, temperature 97.7 F (36.5 C), temperature source Oral, resp. rate 18, height 5' (1.524 m), weight 159 lb 4.8 oz (72.3 kg), SpO2 100 %.  LABORATORY DATA: Lab Results  Component Value Date   WBC 3.0 (L) 04/07/2017   HGB 11.6 04/07/2017   HCT 34.6 (L) 04/07/2017   MCV 88.7 04/07/2017   PLT 170 04/07/2017      Chemistry      Component Value Date/Time   NA 144 04/07/2017 0859   K 3.5 04/07/2017 0859   CL 99 (L) 09/29/2015 0544   CO2 23 04/07/2017 0859   BUN 14.3 04/07/2017 0859   CREATININE 0.8 04/07/2017 0859      Component Value Date/Time   CALCIUM 9.1 04/07/2017 0859    ALKPHOS 68 04/07/2017 0859   AST 14 04/07/2017 0859   ALT 15 04/07/2017 0859   BILITOT 0.33 04/07/2017 0859       RADIOGRAPHIC STUDIES: Ir US Guide Vasc Access Right  Result Date: 03/12/2017 INDICATION: 83 year old female with right-sided squamous cell carcinoma. EXAM: IMPLANTED PORT A CATH PLACEMENT WITH ULTRASOUND AND FLUOROSCOPIC GUIDANCE MEDICATIONS: Vancomycin 1 gm IV; The antibiotic was administered within an appropriate time interval prior to skin puncture. ANESTHESIA/SEDATION: Moderate (conscious) sedation was employed during this procedure. A total of Versed 2.0 mg and Fentanyl 75 mcg was administered intravenously. Moderate Sedation Time: 16 minutes. The patient's level of consciousness and vital signs were monitored continuously by radiology nursing throughout the procedure under my direct supervision. FLUOROSCOPY TIME:  Zero minutes, 6 seconds (3 mGy) COMPLICATIONS: None PROCEDURE: The procedure, risks, benefits, and alternatives were explained to the patient.  Questions regarding the procedure were encouraged and answered. The patient understands and consents to the procedure. Ultrasound survey was performed with images stored and sent to PACs. The catheterization neck and chest was prepped with chlorhexidine, and draped in the usual sterile fashion using maximum barrier technique (cap and mask, sterile gown, sterile gloves, large sterile sheet, hand hygiene and cutaneous antiseptic). Antibiotic prophylaxis was provided with 2.0g Ancef administered IV one hour prior to skin incision. Local anesthesia was attained by infiltration with 1% lidocaine without epinephrine. Ultrasound demonstrated patency of the right internal jugular vein, and this was documented with an image. Under real-time ultrasound guidance, this vein was accessed with a 21 gauge micropuncture needle and image documentation was performed. A small dermatotomy was made at the access site with an 11 scalpel. A 0.018" wire was  advanced into the SVC and used to estimate the length of the internal catheter. The access needle exchanged for a 78F micropuncture vascular sheath. The 0.018" wire was then removed and a 0.035" wire advanced into the IVC. An appropriate location for the subcutaneous reservoir was selected below the clavicle and an incision was made through the skin and underlying soft tissues. The subcutaneous tissues were then dissected using a combination of blunt and sharp surgical technique and a pocket was formed. A single lumen power injectable portacatheter was then tunneled through the subcutaneous tissues from the pocket to the dermatotomy and the port reservoir placed within the subcutaneous pocket. The venous access site was then serially dilated and a peel away vascular sheath placed over the wire. The wire was removed and the port catheter advanced into position under fluoroscopic guidance. The catheter tip is positioned in the cavoatrial junction. This was documented with a spot image. The portacatheter was then tested and found to flush and aspirate well. The port was flushed with saline followed by 100 units/mL heparinized saline. The pocket was then closed in two layers using first subdermal inverted interrupted absorbable sutures followed by a running subcuticular suture. The epidermis was then sealed with Dermabond. The dermatotomy at the venous access site was also seal with Dermabond. Patient tolerated the procedure well and remained hemodynamically stable throughout. No complications encountered and no significant blood loss encountered IMPRESSION: Status post right IJ port catheter placement. Catheter ready for use. Signed, Dulcy Fanny. Earleen Newport, DO [facility] Radiology Electronically Signed   By: Corrie Mckusick D.O.   On: 03/12/2017 15:48   Ir Fluoro Guide Port Insertion Right  Result Date: 03/12/2017 INDICATION: 83 year old female with right-sided squamous cell  carcinoma. EXAM: IMPLANTED PORT A CATH PLACEMENT WITH ULTRASOUND AND FLUOROSCOPIC GUIDANCE MEDICATIONS: Vancomycin 1 gm IV; The antibiotic was administered within an appropriate time interval prior to skin puncture. ANESTHESIA/SEDATION: Moderate (conscious) sedation was employed during this procedure. A total of Versed 2.0 mg and Fentanyl 75 mcg was administered intravenously. Moderate Sedation Time: 16 minutes. The patient's level of consciousness and vital signs were monitored continuously by radiology nursing throughout the procedure under my direct supervision. FLUOROSCOPY TIME:  Zero minutes, 6 seconds (3 mGy) COMPLICATIONS: None PROCEDURE: The procedure, risks, benefits, and alternatives were explained to the patient. Questions regarding the procedure were encouraged and answered. The patient understands and consents to the procedure. Ultrasound survey was performed with images stored and sent to PACs. The catheterization neck and chest was prepped with chlorhexidine, and draped in the usual sterile fashion using maximum barrier technique (cap and mask, sterile gown, sterile gloves, large sterile sheet, hand hygiene  and cutaneous antiseptic). Antibiotic prophylaxis was provided with 2.0g Ancef administered IV one hour prior to skin incision. Local anesthesia was attained by infiltration with 1% lidocaine without epinephrine. Ultrasound demonstrated patency of the right internal jugular vein, and this was documented with an image. Under real-time ultrasound guidance, this vein was accessed with a 21 gauge micropuncture needle and image documentation was performed. A small dermatotomy was made at the access site with an 11 scalpel. A 0.018" wire was advanced into the SVC and used to estimate the length of the internal catheter. The access needle exchanged for a 55F micropuncture vascular sheath. The 0.018" wire was then removed and a 0.035" wire advanced into the IVC. An appropriate location for the subcutaneous  reservoir was selected below the clavicle and an incision was made through the skin and underlying soft tissues. The subcutaneous tissues were then dissected using a combination of blunt and sharp surgical technique and a pocket was formed. A single lumen power injectable portacatheter was then tunneled through the subcutaneous tissues from the pocket to the dermatotomy and the port reservoir placed within the subcutaneous pocket. The venous access site was then serially dilated and a peel away vascular sheath placed over the wire. The wire was removed and the port catheter advanced into position under fluoroscopic guidance. The catheter tip is positioned in the cavoatrial junction. This was documented with a spot image. The portacatheter was then tested and found to flush and aspirate well. The port was flushed with saline followed by 100 units/mL heparinized saline. The pocket was then closed in two layers using first subdermal inverted interrupted absorbable sutures followed by a running subcuticular suture. The epidermis was then sealed with Dermabond. The dermatotomy at the venous access site was also seal with Dermabond. Patient tolerated the procedure well and remained hemodynamically stable throughout. No complications encountered and no significant blood loss encountered IMPRESSION: Status post right IJ port catheter placement. Catheter ready for use. Signed, Dulcy Fanny. Earleen Newport, DO [facility] Radiology Electronically Signed   By: Corrie Mckusick D.O.   On: 03/12/2017 15:48    ASSESSMENT AND PLAN:  This is a very pleasant 83 years old white female with a stage IV non-small cell lung cancer, squamous cell carcinoma with PDL 1 expression 5%. The patient is currently undergoing systemic chemotherapy with carboplatin and paclitaxel is status post 1 cycle. She tolerated the first week of her treatment well except for fatigue and few episodes of diarrhea. I  recommended for the patient to continue her treatment as a scheduled. She is expected to start cycle #2 and 2 weeks. I will see her back for follow-up visit at that time. For the diarrhea, she was advised to use Imodium as needed. For the insomnia, she will try Tylenol PM and if no improvement we will consider her for treatment with Restoril. The patient was advised to call immediately if she has any concerning symptoms in the interval. The patient voices understanding of current disease status and treatment options and is in agreement with the current care plan. All questions were answered. The patient knows to call the clinic with any problems, questions or concerns. We can certainly see the patient much sooner if necessary. I spent 10 minutes counseling the patient face to face. The total time spent in the appointment was 15 minutes.  Disclaimer: This note was dictated with voice recognition software. Similar sounding words can inadvertently be transcribed and may not be corrected upon review.

## 2017-04-11 ENCOUNTER — Telehealth: Payer: Self-pay | Admitting: Internal Medicine

## 2017-04-11 NOTE — Telephone Encounter (Signed)
Per 4/30 los. - treatments and f/u already scheduled- no additional appts needed.

## 2017-04-17 ENCOUNTER — Other Ambulatory Visit: Payer: Medicare Other

## 2017-04-17 ENCOUNTER — Ambulatory Visit (HOSPITAL_BASED_OUTPATIENT_CLINIC_OR_DEPARTMENT_OTHER): Payer: Medicare Other | Admitting: Nurse Practitioner

## 2017-04-17 ENCOUNTER — Other Ambulatory Visit: Payer: Self-pay | Admitting: Medical Oncology

## 2017-04-17 ENCOUNTER — Ambulatory Visit (HOSPITAL_BASED_OUTPATIENT_CLINIC_OR_DEPARTMENT_OTHER): Payer: Medicare Other

## 2017-04-17 ENCOUNTER — Ambulatory Visit: Payer: Medicare Other

## 2017-04-17 VITALS — BP 134/62 | HR 88 | Temp 97.7°F | Resp 17 | Ht 60.0 in | Wt 164.3 lb

## 2017-04-17 DIAGNOSIS — E876 Hypokalemia: Secondary | ICD-10-CM

## 2017-04-17 DIAGNOSIS — Z95828 Presence of other vascular implants and grafts: Secondary | ICD-10-CM

## 2017-04-17 DIAGNOSIS — C3491 Malignant neoplasm of unspecified part of right bronchus or lung: Secondary | ICD-10-CM

## 2017-04-17 DIAGNOSIS — C3411 Malignant neoplasm of upper lobe, right bronchus or lung: Secondary | ICD-10-CM | POA: Diagnosis not present

## 2017-04-17 DIAGNOSIS — D6959 Other secondary thrombocytopenia: Secondary | ICD-10-CM

## 2017-04-17 DIAGNOSIS — Z5111 Encounter for antineoplastic chemotherapy: Secondary | ICD-10-CM

## 2017-04-17 LAB — CBC WITH DIFFERENTIAL/PLATELET
BASO%: 0.5 % (ref 0.0–2.0)
Basophils Absolute: 0 10*3/uL (ref 0.0–0.1)
EOS ABS: 0.2 10*3/uL (ref 0.0–0.5)
EOS%: 2.8 % (ref 0.0–7.0)
HCT: 33.7 % — ABNORMAL LOW (ref 34.8–46.6)
HGB: 11.1 g/dL — ABNORMAL LOW (ref 11.6–15.9)
LYMPH%: 20 % (ref 14.0–49.7)
MCH: 30.2 pg (ref 25.1–34.0)
MCHC: 32.9 g/dL (ref 31.5–36.0)
MCV: 91.6 fL (ref 79.5–101.0)
MONO#: 0.8 10*3/uL (ref 0.1–0.9)
MONO%: 13 % (ref 0.0–14.0)
NEUT%: 63.7 % (ref 38.4–76.8)
NEUTROS ABS: 3.9 10*3/uL (ref 1.5–6.5)
PLATELETS: 112 10*3/uL — AB (ref 145–400)
RBC: 3.68 10*6/uL — AB (ref 3.70–5.45)
RDW: 16.3 % — ABNORMAL HIGH (ref 11.2–14.5)
WBC: 6.1 10*3/uL (ref 3.9–10.3)
lymph#: 1.2 10*3/uL (ref 0.9–3.3)

## 2017-04-17 LAB — COMPREHENSIVE METABOLIC PANEL
ALBUMIN: 2.7 g/dL — AB (ref 3.5–5.0)
ALK PHOS: 54 U/L (ref 40–150)
ALT: 13 U/L (ref 0–55)
AST: 10 U/L (ref 5–34)
Anion Gap: 9 mEq/L (ref 3–11)
BILIRUBIN TOTAL: 0.29 mg/dL (ref 0.20–1.20)
BUN: 12.9 mg/dL (ref 7.0–26.0)
CO2: 23 meq/L (ref 22–29)
Calcium: 6.9 mg/dL — ABNORMAL LOW (ref 8.4–10.4)
Chloride: 115 mEq/L — ABNORMAL HIGH (ref 98–109)
Creatinine: 0.8 mg/dL (ref 0.6–1.1)
EGFR: 72 mL/min/{1.73_m2} — AB (ref >90)
Glucose: 145 mg/dl — ABNORMAL HIGH (ref 70–140)
Potassium: 2.6 mEq/L — CL (ref 3.5–5.1)
Sodium: 147 mEq/L — ABNORMAL HIGH (ref 136–145)
TOTAL PROTEIN: 5.2 g/dL — AB (ref 6.4–8.3)

## 2017-04-17 MED ORDER — PALONOSETRON HCL INJECTION 0.25 MG/5ML
0.2500 mg | Freq: Once | INTRAVENOUS | Status: AC
Start: 2017-04-17 — End: 2017-04-17
  Administered 2017-04-17: 0.25 mg via INTRAVENOUS

## 2017-04-17 MED ORDER — SODIUM CHLORIDE 0.9 % IV SOLN: INTRAVENOUS | Status: DC

## 2017-04-17 MED ORDER — POTASSIUM CHLORIDE 10 MEQ/100ML IV SOLN
10.0000 meq | INTRAVENOUS | Status: AC
  Administered 2017-04-17 (×4): 10 meq via INTRAVENOUS
  Filled 2017-04-17: qty 100

## 2017-04-17 MED ORDER — FAMOTIDINE IN NACL 20-0.9 MG/50ML-% IV SOLN
20.0000 mg | Freq: Once | INTRAVENOUS | Status: AC
Start: 2017-04-17 — End: 2017-04-17
  Administered 2017-04-17: 20 mg via INTRAVENOUS

## 2017-04-17 MED ORDER — SODIUM CHLORIDE 0.9 % IV SOLN
358.0000 mg | Freq: Once | INTRAVENOUS | Status: AC
  Administered 2017-04-17: 360 mg via INTRAVENOUS
  Filled 2017-04-17: qty 36

## 2017-04-17 MED ORDER — SODIUM CHLORIDE 0.9 % IV SOLN
Freq: Once | INTRAVENOUS | Status: AC
  Administered 2017-04-17: 10:00:00 via INTRAVENOUS

## 2017-04-17 MED ORDER — DIPHENHYDRAMINE HCL 50 MG/ML IJ SOLN
50.0000 mg | Freq: Once | INTRAMUSCULAR | Status: AC
  Administered 2017-04-17: 50 mg via INTRAVENOUS

## 2017-04-17 MED ORDER — FAMOTIDINE IN NACL 20-0.9 MG/50ML-% IV SOLN
INTRAVENOUS | Status: AC
  Filled 2017-04-17: qty 50

## 2017-04-17 MED ORDER — SODIUM CHLORIDE 0.9% FLUSH
10.0000 mL | Freq: Once | INTRAVENOUS | Status: AC
  Administered 2017-04-17: 10 mL
  Filled 2017-04-17: qty 10

## 2017-04-17 MED ORDER — SODIUM CHLORIDE 0.9 % IV SOLN
20.0000 mg | Freq: Once | INTRAVENOUS | Status: AC
  Administered 2017-04-17: 20 mg via INTRAVENOUS
  Filled 2017-04-17: qty 2

## 2017-04-17 MED ORDER — PACLITAXEL CHEMO INJECTION 300 MG/50ML
175.0000 mg/m2 | Freq: Once | INTRAVENOUS | Status: AC
  Administered 2017-04-17: 306 mg via INTRAVENOUS
  Filled 2017-04-17: qty 51

## 2017-04-17 MED ORDER — SODIUM CHLORIDE 0.9% FLUSH
10.0000 mL | INTRAVENOUS | Status: DC | PRN
  Administered 2017-04-17: 10 mL
  Filled 2017-04-17: qty 10

## 2017-04-17 MED ORDER — DIPHENHYDRAMINE HCL 50 MG/ML IJ SOLN
INTRAMUSCULAR | Status: AC
  Filled 2017-04-17: qty 1

## 2017-04-17 MED ORDER — PALONOSETRON HCL INJECTION 0.25 MG/5ML
INTRAVENOUS | Status: AC
  Filled 2017-04-17: qty 5

## 2017-04-17 MED ORDER — HEPARIN SOD (PORK) LOCK FLUSH 100 UNIT/ML IV SOLN
500.0000 [IU] | Freq: Once | INTRAVENOUS | Status: AC | PRN
  Administered 2017-04-17: 500 [IU]
  Filled 2017-04-17: qty 5

## 2017-04-17 NOTE — Patient Instructions (Signed)
[facility] Discharge Instructions for Patients Receiving Chemotherapy  Today you received the following chemotherapy agents: Taxol and Carboplatin.  To help prevent nausea and vomiting after your treatment, we encourage you to take your nausea medication: Prochlorperazine (Compazine) 10 mg every 6 hours as needed for nausea.  If you develop nausea and vomiting that is not controlled by your nausea medication, call the clinic.   BELOW ARE SYMPTOMS THAT SHOULD BE REPORTED IMMEDIATELY:  *FEVER GREATER THAN 100.5 F  *CHILLS WITH OR WITHOUT FEVER  NAUSEA AND VOMITING THAT IS NOT CONTROLLED WITH YOUR NAUSEA MEDICATION  *UNUSUAL SHORTNESS OF BREATH  *UNUSUAL BRUISING OR BLEEDING  TENDERNESS IN MOUTH AND THROAT WITH OR WITHOUT PRESENCE OF ULCERS  *URINARY PROBLEMS  *BOWEL PROBLEMS  UNUSUAL RASH Items with * indicate a potential emergency and should be followed up as soon as possible.  Feel free to call the clinic you have any questions or concerns. The clinic phone number is [phone].  Please show the [facility] at check-in to the Emergency Department and triage nurse.  Paclitaxel injection What is this medicine? PACLITAXEL (PAK li TAX el) is a chemotherapy drug. It targets fast dividing cells, like cancer cells, and causes these cells to die. This medicine is used to treat ovarian cancer, breast cancer, and other cancers. This medicine may be used for other purposes; ask your health care provider or pharmacist if you have questions. COMMON BRAND NAME(S): Onxol, Taxol What should I tell my health care provider before I take this medicine? They need to know if you have any of these conditions: -blood disorders -irregular heartbeat -infection (especially a virus infection such as chickenpox, cold sores, or herpes) -liver disease -previous or ongoing radiation therapy -an unusual or allergic reaction to paclitaxel, alcohol, polyoxyethylated castor oil,  other chemotherapy agents, other medicines, foods, dyes, or preservatives -pregnant or trying to get pregnant -breast-feeding How should I use this medicine? This drug is given as an infusion into a vein. It is administered in a hospital or clinic by a specially trained health care professional. Talk to your pediatrician regarding the use of this medicine in children. Special care may be needed. Overdosage: If you think you have taken too much of this medicine contact a poison control center or emergency room at once. NOTE: This medicine is only for you. Do not share this medicine with others. What if I miss a dose? It is important not to miss your dose. Call your doctor or health care professional if you are unable to keep an appointment. What may interact with this medicine? Do not take this medicine with any of the following medications: -disulfiram -metronidazole This medicine may also interact with the following medications: -cyclosporine -diazepam -ketoconazole -medicines to increase blood counts like filgrastim, pegfilgrastim, sargramostim -other chemotherapy drugs like cisplatin, doxorubicin, epirubicin, etoposide, teniposide, vincristine -quinidine -testosterone -vaccines -verapamil Talk to your doctor or health care professional before taking any of these medicines: -acetaminophen -aspirin -ibuprofen -ketoprofen -naproxen This list may not describe all possible interactions. Give your health care provider a list of all the medicines, herbs, non-prescription drugs, or dietary supplements you use. Also tell them if you smoke, drink alcohol, or use illegal drugs. Some items may interact with your medicine. What should I watch for while using this medicine? Your condition will be monitored carefully while you are receiving this medicine. You will need important blood work done while you are taking this medicine. This medicine can cause serious allergic reactions.  To reduce your  risk you will need to take other medicine(s) before treatment with this medicine. If you experience allergic reactions like skin rash, itching or hives, swelling of the face, lips, or tongue, tell your doctor or health care professional right away. In some cases, you may be given additional medicines to help with side effects. Follow all directions for their use. This drug may make you feel generally unwell. This is not uncommon, as chemotherapy can affect healthy cells as well as cancer cells. Report any side effects. Continue your course of treatment even though you feel ill unless your doctor tells you to stop. Call your doctor or health care professional for advice if you get a fever, chills or sore throat, or other symptoms of a cold or flu. Do not treat yourself. This drug decreases your body's ability to fight infections. Try to avoid being around people who are sick. This medicine may increase your risk to bruise or bleed. Call your doctor or health care professional if you notice any unusual bleeding. Be careful brushing and flossing your teeth or using a toothpick because you may get an infection or bleed more easily. If you have any dental work done, tell your dentist you are receiving this medicine. Avoid taking products that contain aspirin, acetaminophen, ibuprofen, naproxen, or ketoprofen unless instructed by your doctor. These medicines may hide a fever. Do not become pregnant while taking this medicine. Women should inform their doctor if they wish to become pregnant or think they might be pregnant. There is a potential for serious side effects to an unborn child. Talk to your health care professional or pharmacist for more information. Do not breast-feed an infant while taking this medicine. Men are advised not to father a child while receiving this medicine. This product may contain alcohol. Ask your pharmacist or healthcare provider if this medicine contains alcohol. Be sure to tell all  healthcare providers you are taking this medicine. Certain medicines, like metronidazole and disulfiram, can cause an unpleasant reaction when taken with alcohol. The reaction includes flushing, headache, nausea, vomiting, sweating, and increased thirst. The reaction can last from 30 minutes to several hours. What side effects may I notice from receiving this medicine? Side effects that you should report to your doctor or health care professional as soon as possible: -allergic reactions like skin rash, itching or hives, swelling of the face, lips, or tongue -low blood counts - This drug may decrease the number of white blood cells, red blood cells and platelets. You may be at increased risk for infections and bleeding. -signs of infection - fever or chills, cough, sore throat, pain or difficulty passing urine -signs of decreased platelets or bleeding - bruising, pinpoint red spots on the skin, black, tarry stools, nosebleeds -signs of decreased red blood cells - unusually weak or tired, fainting spells, lightheadedness -breathing problems -chest pain -high or low blood pressure -mouth sores -nausea and vomiting -pain, swelling, redness or irritation at the injection site -pain, tingling, numbness in the hands or feet -slow or irregular heartbeat -swelling of the ankle, feet, hands Side effects that usually do not require medical attention (report to your doctor or health care professional if they continue or are bothersome): -bone pain -complete hair loss including hair on your head, underarms, pubic hair, eyebrows, and eyelashes -changes in the color of fingernails -diarrhea -loosening of the fingernails -loss of appetite -muscle or joint pain -red flush to skin -sweating This list may not describe all possible side  effects. Call your doctor for medical advice about side effects. You may report side effects to FDA at 1-800-FDA-1088. Where should I keep my medicine? This drug is given in  a hospital or clinic and will not be stored at home. NOTE: This sheet is a summary. It may not cover all possible information. If you have questions about this medicine, talk to your doctor, pharmacist, or health care provider.  2018 Elsevier/Gold Standard (2015-09-26 19:58:00)  Carboplatin injection What is this medicine? CARBOPLATIN (KAR boe pla tin) is a chemotherapy drug. It targets fast dividing cells, like cancer cells, and causes these cells to die. This medicine is used to treat ovarian cancer and many other cancers. This medicine may be used for other purposes; ask your health care provider or pharmacist if you have questions. COMMON BRAND NAME(S): Paraplatin What should I tell my health care provider before I take this medicine? They need to know if you have any of these conditions: -blood disorders -hearing problems -kidney disease -recent or ongoing radiation therapy -an unusual or allergic reaction to carboplatin, cisplatin, other chemotherapy, other medicines, foods, dyes, or preservatives -pregnant or trying to get pregnant -breast-feeding How should I use this medicine? This drug is usually given as an infusion into a vein. It is administered in a hospital or clinic by a specially trained health care professional. Talk to your pediatrician regarding the use of this medicine in children. Special care may be needed. Overdosage: If you think you have taken too much of this medicine contact a poison control center or emergency room at once. NOTE: This medicine is only for you. Do not share this medicine with others. What if I miss a dose? It is important not to miss a dose. Call your doctor or health care professional if you are unable to keep an appointment. What may interact with this medicine? -medicines for seizures -medicines to increase blood counts like filgrastim, pegfilgrastim, sargramostim -some antibiotics like amikacin, gentamicin, neomycin, streptomycin,  tobramycin -vaccines Talk to your doctor or health care professional before taking any of these medicines: -acetaminophen -aspirin -ibuprofen -ketoprofen -naproxen This list may not describe all possible interactions. Give your health care provider a list of all the medicines, herbs, non-prescription drugs, or dietary supplements you use. Also tell them if you smoke, drink alcohol, or use illegal drugs. Some items may interact with your medicine. What should I watch for while using this medicine? Your condition will be monitored carefully while you are receiving this medicine. You will need important blood work done while you are taking this medicine. This drug may make you feel generally unwell. This is not uncommon, as chemotherapy can affect healthy cells as well as cancer cells. Report any side effects. Continue your course of treatment even though you feel ill unless your doctor tells you to stop. In some cases, you may be given additional medicines to help with side effects. Follow all directions for their use. Call your doctor or health care professional for advice if you get a fever, chills or sore throat, or other symptoms of a cold or flu. Do not treat yourself. This drug decreases your body's ability to fight infections. Try to avoid being around people who are sick. This medicine may increase your risk to bruise or bleed. Call your doctor or health care professional if you notice any unusual bleeding. Be careful brushing and flossing your teeth or using a toothpick because you may get an infection or bleed more easily. If you have  any dental work done, tell your dentist you are receiving this medicine. Avoid taking products that contain aspirin, acetaminophen, ibuprofen, naproxen, or ketoprofen unless instructed by your doctor. These medicines may hide a fever. Do not become pregnant while taking this medicine. Women should inform their doctor if they wish to become pregnant or think  they might be pregnant. There is a potential for serious side effects to an unborn child. Talk to your health care professional or pharmacist for more information. Do not breast-feed an infant while taking this medicine. What side effects may I notice from receiving this medicine? Side effects that you should report to your doctor or health care professional as soon as possible: -allergic reactions like skin rash, itching or hives, swelling of the face, lips, or tongue -signs of infection - fever or chills, cough, sore throat, pain or difficulty passing urine -signs of decreased platelets or bleeding - bruising, pinpoint red spots on the skin, black, tarry stools, nosebleeds -signs of decreased red blood cells - unusually weak or tired, fainting spells, lightheadedness -breathing problems -changes in hearing -changes in vision -chest pain -high blood pressure -low blood counts - This drug may decrease the number of white blood cells, red blood cells and platelets. You may be at increased risk for infections and bleeding. -nausea and vomiting -pain, swelling, redness or irritation at the injection site -pain, tingling, numbness in the hands or feet -problems with balance, talking, walking -trouble passing urine or change in the amount of urine Side effects that usually do not require medical attention (report to your doctor or health care professional if they continue or are bothersome): -hair loss -loss of appetite -metallic taste in the mouth or changes in taste This list may not describe all possible side effects. Call your doctor for medical advice about side effects. You may report side effects to FDA at 1-800-FDA-1088. Where should I keep my medicine? This drug is given in a hospital or clinic and will not be stored at home. NOTE: This sheet is a summary. It may not cover all possible information. If you have questions about this medicine, talk to your doctor, pharmacist, or health care  provider.  2018 Elsevier/Gold Standard (2008-03-01 14:38:05)   Hypokalemia Hypokalemia means that the amount of potassium in the blood is lower than normal.Potassium is a chemical that helps regulate the amount of fluid in the body (electrolyte). It also stimulates muscle tightening (contraction) and helps nerves work properly.Normally, most of the body's potassium is inside of cells, and only a very small amount is in the blood. Because the amount in the blood is so small, minor changes to potassium levels in the blood can be life-threatening. What are the causes? This condition may be caused by:  Antibiotic medicine.  Diarrhea or vomiting. Taking too much of a medicine that helps you have a bowel movement (laxative) can cause diarrhea and lead to hypokalemia.  Chronic kidney disease (CKD).  Medicines that help the body get rid of excess fluid (diuretics).  Eating disorders, such as bulimia.  Low magnesium levels in the body.  Sweating a lot. What are the signs or symptoms? Symptoms of this condition include:  Weakness.  Constipation.  Fatigue.  Muscle cramps.  Mental confusion.  Skipped heartbeats or irregular heartbeat (palpitations).  Tingling or numbness. How is this diagnosed? This condition is diagnosed with a blood test. How is this treated? Hypokalemia can be treated by taking potassium supplements by mouth or adjusting the medicines that you take.  Treatment may also include eating more foods that contain a lot of potassium. If your potassium level is very low, you may need to get potassium through an IV tube in one of your veins and be monitored in the hospital. Follow these instructions at home:  Take over-the-counter and prescription medicines only as told by your health care provider. This includes vitamins and supplements.  Eat a healthy diet. A healthy diet includes fresh fruits and vegetables, whole grains, healthy fats, and lean proteins.  If  instructed, eat more foods that contain a lot of potassium, such as:  Nuts, such as peanuts and pistachios.  Seeds, such as sunflower seeds and pumpkin seeds.  Peas, lentils, and lima beans.  Whole grain and bran cereals and breads.  Fresh fruits and vegetables, such as apricots, avocado, bananas, cantaloupe, kiwi, oranges, tomatoes, asparagus, and potatoes.  Orange juice.  Tomato juice.  Red meats.  Yogurt.  Keep all follow-up visits as told by your health care provider. This is important. Contact a health care provider if:  You have weakness that gets worse.  You feel your heart pounding or racing.  You vomit.  You have diarrhea.  You have diabetes (diabetes mellitus) and you have trouble keeping your blood sugar (glucose) in your target range. Get help right away if:  You have chest pain.  You have shortness of breath.  You have vomiting or diarrhea that lasts for more than 2 days.  You faint. This information is not intended to replace advice given to you by your health care provider. Make sure you discuss any questions you have with your health care provider. Document Released: 11/25/2005 Document Revised: 07/13/2016 Document Reviewed: 07/13/2016 Elsevier Interactive Patient Education  2017 [employer].

## 2017-04-17 NOTE — Progress Notes (Signed)
Noted potassium of 2.6 per lab draw today. Reviewed with pt who states she is taking 40 mEq in am and 20 mEq in pm with " a few missed doses ".   Pt states she does not have difficulty swallowing them when compliance discussed.  Per MD review order given for 40 mEq IV with treatment today.  Discussed with pharmacy and informed above compatible with taxol per Y site.  Above discussed with assigned nurse.

## 2017-04-17 NOTE — Progress Notes (Addendum)
[facility] OFFICE PROGRESS NOTE   DIAGNOSIS: Stage IV (T2b, N3, M1 a) non-small cell lung cancer, squamous cell carcinoma presented with large right upper lobe lung mass in addition to bilateral mediastinal lymphadenopathy as well as left upper lobe lung nodule diagnosed in March 2018. PDL 1 expression is 5%.   PRIOR THERAPY: None  CURRENT THERAPY: Systemic chemotherapy with carboplatin for AUC of 5 and paclitaxel 175 MG/M2 every 3 weeks. First dose 03/24/2017. Status post one cycle.    INTERVAL HISTORY:   Ms. Lefeber returns as scheduled. She completed cycle 1 carboplatin/Taxol 03/28/2017. At present she feels well. She had diffuse achy pain for about 2-3 days after the chemotherapy. She noted an increase in loose stools from baseline for 2 or 3 days as well. She denies significant nausea/vomiting. No increase in numbness/tingling hands or feet. She has stable dyspnea on exertion. No fever. She has a cough. She has noted hair loss.  Objective:  Vital signs in last 24 hours:  Blood pressure 134/62, pulse 88, temperature 97.7 F (36.5 C), temperature source Oral, resp. rate 17, height 5' (1.524 m), weight 164 lb 4.8 oz (74.5 kg), SpO2 98 %.    HEENT: No thrush or ulcers. Resp: Lungs clear bilaterally. Cardio: Regular rate and rhythm. GI: Abdomen soft and nontender. No hepatomegaly. Vascular: Trace lower leg edema bilaterally. Calves soft and nontender. Neuro: Alert and oriented.  Skin: A few ecchymoses scattered over the forearms bilaterally. Port-A-Cath without erythema.    Lab Results:  Lab Results  Component Value Date   WBC 6.1 04/17/2017   HGB 11.1 (L) 04/17/2017   HCT 33.7 (L) 04/17/2017   MCV 91.6 04/17/2017   PLT 112 (L) 04/17/2017   NEUTROABS 3.9 04/17/2017    Imaging:  No results found.  Medications: I have reviewed the patient's current medications.  Assessment/Plan: 1. Stage IV non-small cell lung cancer, squamous cell carcinoma with PDL  1 expression 5%, currently undergoing systemic chemotherapy with carboplatin and paclitaxel status post 1 cycle. 2. Arthralgias following cycle 1 carboplatin/Taxol. Likely related to the Taxol. 3. Mild thrombocytopenia secondary to chemotherapy.   Disposition: Ms. Calderone appears stable. She has completed 1 cycle of carboplatin/Taxol. Plan to proceed with cycle 2 today as scheduled.  We reviewed today's labs and discussed the mildly decreased platelet count. She understands to contact the office with bruising/bleeding.  She will continue weekly labs. She will return for a follow-up visit and cycle 3 carboplatin/Taxol in 3 weeks. She will contact the office in the interim as outlined above or with any other problems.  Patient seen with Dr. Julien Nordmann.    Ned Card ANP/GNP-BC   04/17/2017  9:51 AM   ADDENDUM: Hematology/Oncology Attending: I had a face to face encounter with the patient. I recommended her care plan. This is a very pleasant 83 years old white female with a stage IV non-small cell lung cancer, squamous cell carcinoma. She is currently undergoing systemic chemotherapy with carboplatin and paclitaxel is status post 1 cycle. She tolerated the first cycle of her treatment well except for fatigue and weakness after the first dose of her treatment for several days. She denied having any significant fever or chills. She has no nausea or vomiting. Unfortunately her daughter was diagnosed with lung cancer recently and she is expected to come to our clinic in the next 3 weeks. I recommended for the patient to continue her current treatment with carboplatin and paclitaxel as a scheduled. She will receive cycle #2 today.  For the hypokalemia, we will give the patient potassium chloride IV in the clinic and we will increase her dose of potassium chloride to 20 mg twice a day at home. She will come back for follow-up visit in 3 weeks for reevaluation before the next dose of her  treatment.  Disclaimer: This note was dictated with voice recognition software. Similar sounding words can inadvertently be transcribed and may be missed upon review. Eilleen Kempf., MD 04/18/17

## 2017-04-17 NOTE — Patient Instructions (Signed)
Implanted Port Home Guide An implanted port is a type of central line that is placed under the skin. Central lines are used to provide IV access when treatment or nutrition needs to be given through a person's veins. Implanted ports are used for long-term IV access. An implanted port may be placed because:  You need IV medicine that would be irritating to the small veins in your hands or arms.  You need long-term IV medicines, such as antibiotics.  You need IV nutrition for a long period.  You need frequent blood draws for lab tests.  You need dialysis.  Implanted ports are usually placed in the chest area, but they can also be placed in the upper arm, the abdomen, or the leg. An implanted port has two main parts:  Reservoir. The reservoir is round and will appear as a small, raised area under your skin. The reservoir is the part where a needle is inserted to give medicines or draw blood.  Catheter. The catheter is a thin, flexible tube that extends from the reservoir. The catheter is placed into a large vein. Medicine that is inserted into the reservoir goes into the catheter and then into the vein.  How will I care for my incision site? Do not get the incision site wet. Bathe or shower as directed by your health care provider. How is my port accessed? Special steps must be taken to access the port:  Before the port is accessed, a numbing cream can be placed on the skin. This helps numb the skin over the port site.  Your health care provider uses a sterile technique to access the port. ? Your health care provider must put on a mask and sterile gloves. ? The skin over your port is cleaned carefully with an antiseptic and allowed to dry. ? The port is gently pinched between sterile gloves, and a needle is inserted into the port.  Only "non-coring" port needles should be used to access the port. Once the port is accessed, a blood return should be checked. This helps ensure that the port  is in the vein and is not clogged.  If your port needs to remain accessed for a constant infusion, a clear (transparent) bandage will be placed over the needle site. The bandage and needle will need to be changed every week, or as directed by your health care provider.  Keep the bandage covering the needle clean and dry. Do not get it wet. Follow your health care provider's instructions on how to take a shower or bath while the port is accessed.  If your port does not need to stay accessed, no bandage is needed over the port.  What is flushing? Flushing helps keep the port from getting clogged. Follow your health care provider's instructions on how and when to flush the port. Ports are usually flushed with saline solution or a medicine called heparin. The need for flushing will depend on how the port is used.  If the port is used for intermittent medicines or blood draws, the port will need to be flushed: ? After medicines have been given. ? After blood has been drawn. ? As part of routine maintenance.  If a constant infusion is running, the port may not need to be flushed.  How long will my port stay implanted? The port can stay in for as long as your health care provider thinks it is needed. When it is time for the port to come out, surgery will be   done to remove it. The procedure is similar to the one performed when the port was put in. When should I seek immediate medical care? When you have an implanted port, you should seek immediate medical care if:  You notice a bad smell coming from the incision site.  You have swelling, redness, or drainage at the incision site.  You have more swelling or pain at the port site or the surrounding area.  You have a fever that is not controlled with medicine.  This information is not intended to replace advice given to you by your health care provider. Make sure you discuss any questions you have with your health care provider. Document  Released: 11/25/2005 Document Revised: 05/02/2016 Document Reviewed: 08/02/2013 Elsevier Interactive Patient Education  2017 Elsevier Inc.  

## 2017-04-18 ENCOUNTER — Encounter: Payer: Self-pay | Admitting: Nurse Practitioner

## 2017-04-18 ENCOUNTER — Telehealth: Payer: Self-pay | Admitting: Nurse Practitioner

## 2017-04-18 DIAGNOSIS — E876 Hypokalemia: Secondary | ICD-10-CM

## 2017-04-18 HISTORY — DX: Hypokalemia: E87.6

## 2017-04-18 NOTE — Telephone Encounter (Signed)
Appts already scheduled per 5/10 los.

## 2017-04-21 ENCOUNTER — Ambulatory Visit: Payer: Medicare Other | Admitting: Internal Medicine

## 2017-04-21 ENCOUNTER — Ambulatory Visit: Payer: Medicare Other

## 2017-04-21 ENCOUNTER — Other Ambulatory Visit: Payer: Medicare Other

## 2017-04-24 ENCOUNTER — Other Ambulatory Visit (HOSPITAL_BASED_OUTPATIENT_CLINIC_OR_DEPARTMENT_OTHER): Payer: Medicare Other

## 2017-04-24 ENCOUNTER — Other Ambulatory Visit: Payer: Self-pay | Admitting: Medical Oncology

## 2017-04-24 ENCOUNTER — Ambulatory Visit (HOSPITAL_BASED_OUTPATIENT_CLINIC_OR_DEPARTMENT_OTHER): Payer: Medicare Other

## 2017-04-24 ENCOUNTER — Other Ambulatory Visit: Payer: Medicare Other

## 2017-04-24 VITALS — BP 149/74 | HR 80 | Temp 97.0°F | Resp 18

## 2017-04-24 DIAGNOSIS — C3491 Malignant neoplasm of unspecified part of right bronchus or lung: Secondary | ICD-10-CM

## 2017-04-24 DIAGNOSIS — Z95828 Presence of other vascular implants and grafts: Secondary | ICD-10-CM

## 2017-04-24 DIAGNOSIS — C3411 Malignant neoplasm of upper lobe, right bronchus or lung: Secondary | ICD-10-CM | POA: Diagnosis not present

## 2017-04-24 LAB — COMPREHENSIVE METABOLIC PANEL
ALBUMIN: 3.8 g/dL (ref 3.5–5.0)
ALT: 18 U/L (ref 0–55)
AST: 15 U/L (ref 5–34)
Alkaline Phosphatase: 68 U/L (ref 40–150)
Anion Gap: 7 mEq/L (ref 3–11)
BILIRUBIN TOTAL: 0.7 mg/dL (ref 0.20–1.20)
BUN: 27.1 mg/dL — ABNORMAL HIGH (ref 7.0–26.0)
CO2: 21 meq/L — AB (ref 22–29)
CREATININE: 0.9 mg/dL (ref 0.6–1.1)
Calcium: 10 mg/dL (ref 8.4–10.4)
Chloride: 110 mEq/L — ABNORMAL HIGH (ref 98–109)
EGFR: 57 mL/min/{1.73_m2} — ABNORMAL LOW (ref >90)
Glucose: 126 mg/dl (ref 70–140)
Potassium: 4.8 mEq/L (ref 3.5–5.1)
Sodium: 138 mEq/L (ref 136–145)
TOTAL PROTEIN: 7.4 g/dL (ref 6.4–8.3)

## 2017-04-24 LAB — CBC WITH DIFFERENTIAL/PLATELET
BASO%: 0.9 % (ref 0.0–2.0)
Basophils Absolute: 0 10*3/uL (ref 0.0–0.1)
EOS%: 2.7 % (ref 0.0–7.0)
Eosinophils Absolute: 0.1 10*3/uL (ref 0.0–0.5)
HEMATOCRIT: 35 % (ref 34.8–46.6)
HEMOGLOBIN: 11.7 g/dL (ref 11.6–15.9)
LYMPH#: 1.1 10*3/uL (ref 0.9–3.3)
LYMPH%: 29.2 % (ref 14.0–49.7)
MCH: 30.4 pg (ref 25.1–34.0)
MCHC: 33.4 g/dL (ref 31.5–36.0)
MCV: 90.9 fL (ref 79.5–101.0)
MONO#: 0.1 10*3/uL (ref 0.1–0.9)
MONO%: 1.6 % (ref 0.0–14.0)
NEUT%: 65.6 % (ref 38.4–76.8)
NEUTROS ABS: 2.6 10*3/uL (ref 1.5–6.5)
PLATELETS: 121 10*3/uL — AB (ref 145–400)
RBC: 3.86 10*6/uL (ref 3.70–5.45)
RDW: 16.6 % — AB (ref 11.2–14.5)
WBC: 3.9 10*3/uL (ref 3.9–10.3)

## 2017-04-24 MED ORDER — HEPARIN SOD (PORK) LOCK FLUSH 100 UNIT/ML IV SOLN
500.0000 [IU] | Freq: Once | INTRAVENOUS | Status: AC
  Administered 2017-04-24: 500 [IU]
  Filled 2017-04-24: qty 5

## 2017-04-24 MED ORDER — SODIUM CHLORIDE 0.9% FLUSH
10.0000 mL | Freq: Once | INTRAVENOUS | Status: AC
  Administered 2017-04-24: 10 mL
  Filled 2017-04-24: qty 10

## 2017-04-24 NOTE — Patient Instructions (Signed)
Implanted Port Home Guide An implanted port is a type of central line that is placed under the skin. Central lines are used to provide IV access when treatment or nutrition needs to be given through a person's veins. Implanted ports are used for long-term IV access. An implanted port may be placed because:  You need IV medicine that would be irritating to the small veins in your hands or arms.  You need long-term IV medicines, such as antibiotics.  You need IV nutrition for a long period.  You need frequent blood draws for lab tests.  You need dialysis.  Implanted ports are usually placed in the chest area, but they can also be placed in the upper arm, the abdomen, or the leg. An implanted port has two main parts:  Reservoir. The reservoir is round and will appear as a small, raised area under your skin. The reservoir is the part where a needle is inserted to give medicines or draw blood.  Catheter. The catheter is a thin, flexible tube that extends from the reservoir. The catheter is placed into a large vein. Medicine that is inserted into the reservoir goes into the catheter and then into the vein.  How will I care for my incision site? Do not get the incision site wet. Bathe or shower as directed by your health care provider. How is my port accessed? Special steps must be taken to access the port:  Before the port is accessed, a numbing cream can be placed on the skin. This helps numb the skin over the port site.  Your health care provider uses a sterile technique to access the port. ? Your health care provider must put on a mask and sterile gloves. ? The skin over your port is cleaned carefully with an antiseptic and allowed to dry. ? The port is gently pinched between sterile gloves, and a needle is inserted into the port.  Only "non-coring" port needles should be used to access the port. Once the port is accessed, a blood return should be checked. This helps ensure that the port  is in the vein and is not clogged.  If your port needs to remain accessed for a constant infusion, a clear (transparent) bandage will be placed over the needle site. The bandage and needle will need to be changed every week, or as directed by your health care provider.  Keep the bandage covering the needle clean and dry. Do not get it wet. Follow your health care provider's instructions on how to take a shower or bath while the port is accessed.  If your port does not need to stay accessed, no bandage is needed over the port.  What is flushing? Flushing helps keep the port from getting clogged. Follow your health care provider's instructions on how and when to flush the port. Ports are usually flushed with saline solution or a medicine called heparin. The need for flushing will depend on how the port is used.  If the port is used for intermittent medicines or blood draws, the port will need to be flushed: ? After medicines have been given. ? After blood has been drawn. ? As part of routine maintenance.  If a constant infusion is running, the port may not need to be flushed.  How long will my port stay implanted? The port can stay in for as long as your health care provider thinks it is needed. When it is time for the port to come out, surgery will be   done to remove it. The procedure is similar to the one performed when the port was put in. When should I seek immediate medical care? When you have an implanted port, you should seek immediate medical care if:  You notice a bad smell coming from the incision site.  You have swelling, redness, or drainage at the incision site.  You have more swelling or pain at the port site or the surrounding area.  You have a fever that is not controlled with medicine.  This information is not intended to replace advice given to you by your health care provider. Make sure you discuss any questions you have with your health care provider. Document  Released: 11/25/2005 Document Revised: 05/02/2016 Document Reviewed: 08/02/2013 Elsevier Interactive Patient Education  2017 Elsevier Inc.  

## 2017-04-29 ENCOUNTER — Telehealth: Payer: Self-pay | Admitting: Internal Medicine

## 2017-04-29 NOTE — Telephone Encounter (Signed)
Left message - additional appts added, patient to pick up schedule next visit

## 2017-04-30 ENCOUNTER — Other Ambulatory Visit: Payer: Self-pay | Admitting: Medical Oncology

## 2017-04-30 DIAGNOSIS — C3491 Malignant neoplasm of unspecified part of right bronchus or lung: Secondary | ICD-10-CM

## 2017-05-01 ENCOUNTER — Ambulatory Visit (HOSPITAL_BASED_OUTPATIENT_CLINIC_OR_DEPARTMENT_OTHER): Payer: Medicare Other

## 2017-05-01 ENCOUNTER — Other Ambulatory Visit: Payer: Medicare Other

## 2017-05-01 ENCOUNTER — Other Ambulatory Visit (HOSPITAL_BASED_OUTPATIENT_CLINIC_OR_DEPARTMENT_OTHER): Payer: Medicare Other

## 2017-05-01 DIAGNOSIS — C3491 Malignant neoplasm of unspecified part of right bronchus or lung: Secondary | ICD-10-CM

## 2017-05-01 DIAGNOSIS — C3411 Malignant neoplasm of upper lobe, right bronchus or lung: Secondary | ICD-10-CM

## 2017-05-01 DIAGNOSIS — Z452 Encounter for adjustment and management of vascular access device: Secondary | ICD-10-CM | POA: Diagnosis not present

## 2017-05-01 DIAGNOSIS — Z95828 Presence of other vascular implants and grafts: Secondary | ICD-10-CM

## 2017-05-01 LAB — CBC WITH DIFFERENTIAL/PLATELET
BASO%: 1.1 % (ref 0.0–2.0)
BASOS ABS: 0 10*3/uL (ref 0.0–0.1)
EOS ABS: 0 10*3/uL (ref 0.0–0.5)
EOS%: 1.4 % (ref 0.0–7.0)
HCT: 32.3 % — ABNORMAL LOW (ref 34.8–46.6)
HEMOGLOBIN: 11 g/dL — AB (ref 11.6–15.9)
LYMPH%: 49.4 % (ref 14.0–49.7)
MCH: 31.4 pg (ref 25.1–34.0)
MCHC: 34.1 g/dL (ref 31.5–36.0)
MCV: 92.2 fL (ref 79.5–101.0)
MONO#: 0.6 10*3/uL (ref 0.1–0.9)
MONO%: 19.9 % — AB (ref 0.0–14.0)
NEUT%: 28.2 % — ABNORMAL LOW (ref 38.4–76.8)
NEUTROS ABS: 0.8 10*3/uL — AB (ref 1.5–6.5)
PLATELETS: 171 10*3/uL (ref 145–400)
RBC: 3.51 10*6/uL — AB (ref 3.70–5.45)
RDW: 17.1 % — ABNORMAL HIGH (ref 11.2–14.5)
WBC: 2.9 10*3/uL — AB (ref 3.9–10.3)
lymph#: 1.4 10*3/uL (ref 0.9–3.3)

## 2017-05-01 LAB — COMPREHENSIVE METABOLIC PANEL
ALBUMIN: 3.6 g/dL (ref 3.5–5.0)
ALK PHOS: 70 U/L (ref 40–150)
ALT: 19 U/L (ref 0–55)
ANION GAP: 11 meq/L (ref 3–11)
AST: 13 U/L (ref 5–34)
BILIRUBIN TOTAL: 0.37 mg/dL (ref 0.20–1.20)
BUN: 14 mg/dL (ref 7.0–26.0)
CO2: 20 mEq/L — ABNORMAL LOW (ref 22–29)
Calcium: 8.5 mg/dL (ref 8.4–10.4)
Chloride: 111 mEq/L — ABNORMAL HIGH (ref 98–109)
Creatinine: 0.9 mg/dL (ref 0.6–1.1)
EGFR: 56 mL/min/{1.73_m2} — AB (ref >90)
Glucose: 205 mg/dl — ABNORMAL HIGH (ref 70–140)
POTASSIUM: 3.2 meq/L — AB (ref 3.5–5.1)
Sodium: 142 mEq/L (ref 136–145)
TOTAL PROTEIN: 6.8 g/dL (ref 6.4–8.3)

## 2017-05-01 MED ORDER — HEPARIN SOD (PORK) LOCK FLUSH 100 UNIT/ML IV SOLN
500.0000 [IU] | Freq: Once | INTRAVENOUS | Status: AC
  Administered 2017-05-01: 500 [IU]
  Filled 2017-05-01: qty 5

## 2017-05-01 MED ORDER — SODIUM CHLORIDE 0.9% FLUSH
10.0000 mL | Freq: Once | INTRAVENOUS | Status: AC
  Administered 2017-05-01: 10 mL
  Filled 2017-05-01: qty 10

## 2017-05-07 ENCOUNTER — Other Ambulatory Visit: Payer: Medicare Other

## 2017-05-07 ENCOUNTER — Ambulatory Visit (HOSPITAL_BASED_OUTPATIENT_CLINIC_OR_DEPARTMENT_OTHER): Payer: Medicare Other | Admitting: Internal Medicine

## 2017-05-07 ENCOUNTER — Telehealth: Payer: Self-pay | Admitting: Internal Medicine

## 2017-05-07 ENCOUNTER — Encounter: Payer: Self-pay | Admitting: Internal Medicine

## 2017-05-07 ENCOUNTER — Ambulatory Visit (HOSPITAL_BASED_OUTPATIENT_CLINIC_OR_DEPARTMENT_OTHER): Payer: Medicare Other

## 2017-05-07 ENCOUNTER — Ambulatory Visit: Payer: Medicare Other

## 2017-05-07 ENCOUNTER — Other Ambulatory Visit (HOSPITAL_BASED_OUTPATIENT_CLINIC_OR_DEPARTMENT_OTHER): Payer: Medicare Other

## 2017-05-07 VITALS — BP 100/56 | HR 85 | Temp 97.8°F | Resp 18 | Ht 60.0 in | Wt 161.9 lb

## 2017-05-07 DIAGNOSIS — C3411 Malignant neoplasm of upper lobe, right bronchus or lung: Secondary | ICD-10-CM | POA: Diagnosis not present

## 2017-05-07 DIAGNOSIS — C3491 Malignant neoplasm of unspecified part of right bronchus or lung: Secondary | ICD-10-CM

## 2017-05-07 DIAGNOSIS — Z5111 Encounter for antineoplastic chemotherapy: Secondary | ICD-10-CM

## 2017-05-07 DIAGNOSIS — Z95828 Presence of other vascular implants and grafts: Secondary | ICD-10-CM

## 2017-05-07 LAB — COMPREHENSIVE METABOLIC PANEL
ALT: 18 U/L (ref 0–55)
AST: 16 U/L (ref 5–34)
Albumin: 3.4 g/dL — ABNORMAL LOW (ref 3.5–5.0)
Alkaline Phosphatase: 62 U/L (ref 40–150)
Anion Gap: 9 mEq/L (ref 3–11)
BILIRUBIN TOTAL: 0.45 mg/dL (ref 0.20–1.20)
BUN: 13.9 mg/dL (ref 7.0–26.0)
CHLORIDE: 107 meq/L (ref 98–109)
CO2: 26 meq/L (ref 22–29)
CREATININE: 0.9 mg/dL (ref 0.6–1.1)
Calcium: 8.7 mg/dL (ref 8.4–10.4)
EGFR: 60 mL/min/{1.73_m2} — AB (ref >90)
Glucose: 109 mg/dl (ref 70–140)
Potassium: 3.3 mEq/L — ABNORMAL LOW (ref 3.5–5.1)
Sodium: 143 mEq/L (ref 136–145)
TOTAL PROTEIN: 6.7 g/dL (ref 6.4–8.3)

## 2017-05-07 LAB — CBC WITH DIFFERENTIAL/PLATELET
BASO%: 0.4 % (ref 0.0–2.0)
Basophils Absolute: 0 10*3/uL (ref 0.0–0.1)
EOS%: 0.6 % (ref 0.0–7.0)
Eosinophils Absolute: 0 10*3/uL (ref 0.0–0.5)
HCT: 32.1 % — ABNORMAL LOW (ref 34.8–46.6)
HGB: 10.5 g/dL — ABNORMAL LOW (ref 11.6–15.9)
LYMPH%: 29.9 % (ref 14.0–49.7)
MCH: 30.6 pg (ref 25.1–34.0)
MCHC: 32.7 g/dL (ref 31.5–36.0)
MCV: 93.6 fL (ref 79.5–101.0)
MONO#: 0.6 10*3/uL (ref 0.1–0.9)
MONO%: 11.9 % (ref 0.0–14.0)
NEUT%: 57.2 % (ref 38.4–76.8)
NEUTROS ABS: 2.7 10*3/uL (ref 1.5–6.5)
Platelets: 128 10*3/uL — ABNORMAL LOW (ref 145–400)
RBC: 3.43 10*6/uL — AB (ref 3.70–5.45)
RDW: 19.8 % — ABNORMAL HIGH (ref 11.2–14.5)
WBC: 4.8 10*3/uL (ref 3.9–10.3)
lymph#: 1.4 10*3/uL (ref 0.9–3.3)

## 2017-05-07 MED ORDER — DIPHENHYDRAMINE HCL 50 MG/ML IJ SOLN
INTRAMUSCULAR | Status: AC
  Filled 2017-05-07: qty 1

## 2017-05-07 MED ORDER — HEPARIN SOD (PORK) LOCK FLUSH 100 UNIT/ML IV SOLN
500.0000 [IU] | Freq: Once | INTRAVENOUS | Status: AC | PRN
  Administered 2017-05-07: 500 [IU]
  Filled 2017-05-07: qty 5

## 2017-05-07 MED ORDER — SODIUM CHLORIDE 0.9% FLUSH
10.0000 mL | Freq: Once | INTRAVENOUS | Status: AC
  Administered 2017-05-07: 10 mL
  Filled 2017-05-07: qty 10

## 2017-05-07 MED ORDER — SODIUM CHLORIDE 0.9% FLUSH
10.0000 mL | INTRAVENOUS | Status: DC | PRN
  Administered 2017-05-07: 10 mL
  Filled 2017-05-07: qty 10

## 2017-05-07 MED ORDER — PACLITAXEL CHEMO INJECTION 300 MG/50ML
175.0000 mg/m2 | Freq: Once | INTRAVENOUS | Status: AC
  Administered 2017-05-07: 306 mg via INTRAVENOUS
  Filled 2017-05-07: qty 51

## 2017-05-07 MED ORDER — SODIUM CHLORIDE 0.9 % IV SOLN
20.0000 mg | Freq: Once | INTRAVENOUS | Status: AC
  Administered 2017-05-07: 20 mg via INTRAVENOUS
  Filled 2017-05-07: qty 2

## 2017-05-07 MED ORDER — FAMOTIDINE IN NACL 20-0.9 MG/50ML-% IV SOLN
INTRAVENOUS | Status: AC
  Filled 2017-05-07: qty 50

## 2017-05-07 MED ORDER — PALONOSETRON HCL INJECTION 0.25 MG/5ML
INTRAVENOUS | Status: AC
  Filled 2017-05-07: qty 5

## 2017-05-07 MED ORDER — SODIUM CHLORIDE 0.9 % IV SOLN
Freq: Once | INTRAVENOUS | Status: AC
  Administered 2017-05-07: 13:00:00 via INTRAVENOUS

## 2017-05-07 MED ORDER — SODIUM CHLORIDE 0.9 % IV SOLN
358.0000 mg | Freq: Once | INTRAVENOUS | Status: AC
  Administered 2017-05-07: 360 mg via INTRAVENOUS
  Filled 2017-05-07: qty 36

## 2017-05-07 MED ORDER — PALONOSETRON HCL INJECTION 0.25 MG/5ML
0.2500 mg | Freq: Once | INTRAVENOUS | Status: AC
  Administered 2017-05-07: 0.25 mg via INTRAVENOUS

## 2017-05-07 MED ORDER — DIPHENHYDRAMINE HCL 50 MG/ML IJ SOLN
50.0000 mg | Freq: Once | INTRAMUSCULAR | Status: AC
  Administered 2017-05-07: 50 mg via INTRAVENOUS

## 2017-05-07 MED ORDER — FAMOTIDINE IN NACL 20-0.9 MG/50ML-% IV SOLN
20.0000 mg | Freq: Once | INTRAVENOUS | Status: AC
  Administered 2017-05-07: 20 mg via INTRAVENOUS

## 2017-05-07 NOTE — Progress Notes (Signed)
[facility] [phone]   [phone]  OFFICE PROGRESS NOTE  Lajean Manes, MD [address] [place] [address]  DIAGNOSIS: Stage IV (T2b, N3, M1 a) non-small cell lung cancer, squamous cell carcinoma presented with large right upper lobe lung mass in addition to bilateral mediastinal lymphadenopathy as well as left upper lobe lung nodule diagnosed in March 2018. PDL 1 expression is 5%.   PRIOR THERAPY: None  CURRENT THERAPY: Systemic chemotherapy with carboplatin for AUC of 5 and paclitaxel 175 MG/M2 every 3 weeks. First dose 03/24/2017. Status post 2 cycles.  INTERVAL HISTORY: Maria Washington 83 y.o. female returns to clinic today for follow-up visit accompanied by her daughter. The patient tolerated the last cycle of her treatment well except for a few days of fatigue and weakness. She denied having any current peripheral neuropathy. She denied having any fever or chills. She has mild skin rash in the lower extremities. The patient denied having any chest pain but continues to have shortness of breath with exertion with mild cough and no hemoptysis. She denied having any weight loss or night sweats. She is here today for evaluation before starting cycle #3.   MEDICAL HISTORY: Past Medical History:  Diagnosis Date  . Aortic atherosclerosis ([facility]) 02/10/2017  . Arthritis   . CAD (coronary artery disease), native coronary artery    3 vessel calcification noted on CT scan   . Cardiac pacemaker in situ 12/05/2011  . Chronic diastolic congestive heart failure ([facility])   . COPD (chronic obstructive pulmonary disease) ([facility])    spot on lung  . Encounter for antineoplastic chemotherapy 03/04/2017  . Goals of care, counseling/discussion 03/17/2017  . History of breast cancer    Treated with lumpectomy, radiation, and chemo sees Dr. Ralene Ok   . Hypertensive heart disease without CHF   . Hypokalemia 04/18/2017  . Hypothyroid   . Mitral  regurgitation   . Paroxysmal atrial fibrillation ([facility])    CHA2DS2VASC score 5  . Second degree heart block   . Stage IV squamous cell carcinoma of right lung ([place]) 03/04/2017    ALLERGIES:  is allergic to clindamycin/lincomycin and penicillins.  MEDICATIONS:  Current Outpatient Prescriptions  Medication Sig Dispense Refill  . alendronate (FOSAMAX) 70 MG tablet Take 70 mg by mouth every 7 (seven) days. Monday    . apixaban (ELIQUIS) 5 MG TABS tablet Take 1 tablet (5 mg total) by mouth 2 (two) times daily. 60 tablet 3  . BIOTIN 5000 PO Take 5,000 mcg by mouth daily.     . calcium-vitamin D (OSCAL WITH D) 500-200 MG-UNIT per tablet Take 1 tablet by mouth 2 (two) times daily.      . cholecalciferol (VITAMIN D) 1000 units tablet Take 1 capsule by mouth daily.    Marland Kitchen diltiazem (CARDIZEM CD) 180 MG 24 hr capsule Take 180 mg by mouth daily.    . folic acid (FOLVITE) 1 MG tablet Take 1 mg by mouth daily.      . furosemide (LASIX) 40 MG tablet Take 40 mg by mouth daily.     Marland Kitchen levothyroxine (SYNTHROID, LEVOTHROID) 137 MCG tablet Take 137 mcg by mouth daily before breakfast.    . lidocaine-prilocaine (EMLA) cream Apply 1 application topically as needed. 30 g 0  . Omega-3 Fatty Acids (FISH OIL) 1200 MG CAPS Take 1,200 mg by mouth 2 (two) times daily.     . potassium chloride SA (K-DUR,KLOR-CON) 20 MEQ tablet Take  20-40 mEq by mouth See admin instructions. 2 tabs in the morning, and 1 tab in the evening    . prochlorperazine (COMPAZINE) 10 MG tablet Take 1 tablet (10 mg total) by mouth every 6 (six) hours as needed for nausea or vomiting. 30 tablet 0  . vitamin B-12 (CYANOCOBALAMIN) 1000 MCG tablet Take 1,000 mcg by mouth daily.       No current facility-administered medications for this visit.     SURGICAL HISTORY:  Past Surgical History:  Procedure Laterality Date  . ABDOMINAL HYSTERECTOMY    . BLADDER SUSPENSION    . BREAST LUMPECTOMY     lumpectomy  . CARPAL TUNNEL RELEASE     left wrist  .  CHOLECYSTECTOMY    . FINGER SURGERY    . IR FLUORO GUIDE PORT INSERTION RIGHT  03/12/2017  . IR US GUIDE VASC ACCESS RIGHT  03/12/2017  . PERMANENT PACEMAKER INSERTION N/A 12/04/2011   Procedure: PERMANENT PACEMAKER INSERTION;  Surgeon: Evans Lance, MD;  [facility] C[facility] LAB;  Service: Cardiovascular;  Laterality: N/A;  . SHOULDER SURGERY    . TONSILLECTOMY AND ADENOIDECTOMY      REVIEW OF SYSTEMS:  A comprehensive review of systems was negative except for: Constitutional: positive for fatigue Respiratory: positive for cough Musculoskeletal: positive for muscle weakness   PHYSICAL EXAMINATION: General appearance: alert, cooperative, fatigued and no distress Head: Normocephalic, without obvious abnormality, atraumatic Neck: no adenopathy, no JVD, supple, symmetrical, trachea midline and thyroid not enlarged, symmetric, no tenderness/mass/nodules Lymph nodes: Cervical, supraclavicular, and axillary nodes normal. Resp: clear to auscultation bilaterally Back: symmetric, no curvature. ROM normal. No CVA tenderness. Cardio: regular rate and rhythm, S1, S2 normal, no murmur, click, rub or gallop GI: soft, non-tender; bowel sounds normal; no masses,  no organomegaly Extremities: extremities normal, atraumatic, no cyanosis or edema  ECOG PERFORMANCE STATUS: 1 - Symptomatic but completely ambulatory  Blood pressure (!) 100/56, pulse 85, temperature 97.8 F (36.6 C), temperature source Oral, resp. rate 18, height 5' (1.524 m), weight 161 lb 14.4 oz (73.4 kg), SpO2 98 %.  LABORATORY DATA: Lab Results  Component Value Date   WBC 4.8 05/07/2017   HGB 10.5 (L) 05/07/2017   HCT 32.1 (L) 05/07/2017   MCV 93.6 05/07/2017   PLT 128 (L) 05/07/2017      Chemistry      Component Value Date/Time   NA 142 05/01/2017 0842   K 3.2 (L) 05/01/2017 0842   CL 99 (L) 09/29/2015 0544   CO2 20 (L) 05/01/2017 0842   BUN 14.0 05/01/2017 0842   CREATININE 0.9 05/01/2017 0842      Component Value  Date/Time   CALCIUM 8.5 05/01/2017 0842   ALKPHOS 70 05/01/2017 0842   AST 13 05/01/2017 0842   ALT 19 05/01/2017 0842   BILITOT 0.37 05/01/2017 0842       RADIOGRAPHIC STUDIES: No results found.  ASSESSMENT AND PLAN:  This is a very pleasant 83 years old white female with a stage IV non-small cell lung cancer, squamous cell carcinoma with PDL 1 expression 5%. The patient is currently undergoing systemic chemotherapy with carboplatin and paclitaxel, status post 2 cycles. She tolerated the second cycle fairly well except for fatigue. I recommended for the patient to proceed with cycle #3 today as a scheduled. I will see her back for follow-up visit in 3 weeks for reevaluation and repeat CT scan of the chest, abdomen and pelvis for restaging of her disease. The patient was advised to call  immediately if she has any concerning symptoms in the interval. The patient voices understanding of current disease status and treatment options and is in agreement with the current care plan. All questions were answered. The patient knows to call the clinic with any problems, questions or concerns. We can certainly see the patient much sooner if necessary. I spent 10 minutes counseling the patient face to face. The total time spent in the appointment was 15 minutes. Disclaimer: This note was dictated with voice recognition software. Similar sounding words can inadvertently be transcribed and may not be corrected upon review.

## 2017-05-07 NOTE — Patient Instructions (Signed)

## 2017-05-07 NOTE — Patient Instructions (Signed)
[facility] Discharge Instructions for Patients Receiving Chemotherapy  Today you received the following chemotherapy agents Taxol and Carboplatin  To help prevent nausea and vomiting after your treatment, we encourage you to take your nausea medication as directed. No Zofran for 3 days. Take Compazine instead.   If you develop nausea and vomiting that is not controlled by your nausea medication, call the clinic.   BELOW ARE SYMPTOMS THAT SHOULD BE REPORTED IMMEDIATELY:  *FEVER GREATER THAN 100.5 F  *CHILLS WITH OR WITHOUT FEVER  NAUSEA AND VOMITING THAT IS NOT CONTROLLED WITH YOUR NAUSEA MEDICATION  *UNUSUAL SHORTNESS OF BREATH  *UNUSUAL BRUISING OR BLEEDING  TENDERNESS IN MOUTH AND THROAT WITH OR WITHOUT PRESENCE OF ULCERS  *URINARY PROBLEMS  *BOWEL PROBLEMS  UNUSUAL RASH Items with * indicate a potential emergency and should be followed up as soon as possible.  Feel free to call the clinic you have any questions or concerns. The clinic phone number is [phone].  Please show the [facility] at check-in to the Emergency Department and triage nurse.

## 2017-05-07 NOTE — Telephone Encounter (Signed)
Gave patient AVS and calender per 5/30 los. [facility] to contact patient with CT schedule.

## 2017-05-12 ENCOUNTER — Telehealth: Payer: Self-pay | Admitting: Internal Medicine

## 2017-05-12 ENCOUNTER — Ambulatory Visit (HOSPITAL_BASED_OUTPATIENT_CLINIC_OR_DEPARTMENT_OTHER): Payer: Medicare Other

## 2017-05-12 ENCOUNTER — Telehealth: Payer: Self-pay | Admitting: *Deleted

## 2017-05-12 ENCOUNTER — Other Ambulatory Visit: Payer: Self-pay | Admitting: Medical Oncology

## 2017-05-12 DIAGNOSIS — C3491 Malignant neoplasm of unspecified part of right bronchus or lung: Secondary | ICD-10-CM

## 2017-05-12 DIAGNOSIS — C3411 Malignant neoplasm of upper lobe, right bronchus or lung: Secondary | ICD-10-CM | POA: Diagnosis not present

## 2017-05-12 DIAGNOSIS — R112 Nausea with vomiting, unspecified: Secondary | ICD-10-CM

## 2017-05-12 LAB — COMPREHENSIVE METABOLIC PANEL
ALBUMIN: 4.1 g/dL (ref 3.5–5.0)
ALT: 21 U/L (ref 0–55)
ANION GAP: 14 meq/L — AB (ref 3–11)
AST: 15 U/L (ref 5–34)
Alkaline Phosphatase: 71 U/L (ref 40–150)
BILIRUBIN TOTAL: 1.05 mg/dL (ref 0.20–1.20)
BUN: 35.2 mg/dL — ABNORMAL HIGH (ref 7.0–26.0)
CO2: 23 mEq/L (ref 22–29)
Calcium: 10.2 mg/dL (ref 8.4–10.4)
Chloride: 102 mEq/L (ref 98–109)
Creatinine: 1.4 mg/dL — ABNORMAL HIGH (ref 0.6–1.1)
EGFR: 34 mL/min/{1.73_m2} — AB (ref >90)
Glucose: 153 mg/dl — ABNORMAL HIGH (ref 70–140)
Potassium: 4.5 mEq/L (ref 3.5–5.1)
Sodium: 138 mEq/L (ref 136–145)
TOTAL PROTEIN: 7.9 g/dL (ref 6.4–8.3)

## 2017-05-12 LAB — CBC WITH DIFFERENTIAL/PLATELET
BASO%: 0.6 % (ref 0.0–2.0)
BASOS ABS: 0 10*3/uL (ref 0.0–0.1)
EOS%: 0.2 % (ref 0.0–7.0)
Eosinophils Absolute: 0 10*3/uL (ref 0.0–0.5)
HCT: 36.4 % (ref 34.8–46.6)
HEMOGLOBIN: 12.3 g/dL (ref 11.6–15.9)
LYMPH%: 20.5 % (ref 14.0–49.7)
MCH: 31.6 pg (ref 25.1–34.0)
MCHC: 33.8 g/dL (ref 31.5–36.0)
MCV: 93.6 fL (ref 79.5–101.0)
MONO#: 0.1 10*3/uL (ref 0.1–0.9)
MONO%: 1.5 % (ref 0.0–14.0)
NEUT#: 3.6 10*3/uL (ref 1.5–6.5)
NEUT%: 77.2 % — AB (ref 38.4–76.8)
NRBC: 0 % (ref 0–0)
PLATELETS: 143 10*3/uL — AB (ref 145–400)
RBC: 3.89 10*6/uL (ref 3.70–5.45)
RDW: 18.6 % — AB (ref 11.2–14.5)
WBC: 4.6 10*3/uL (ref 3.9–10.3)
lymph#: 1 10*3/uL (ref 0.9–3.3)

## 2017-05-12 MED ORDER — SODIUM CHLORIDE 0.9 % IJ SOLN
10.0000 mL | Freq: Once | INTRAMUSCULAR | Status: AC
  Administered 2017-05-12: 10 mL
  Filled 2017-05-12: qty 10

## 2017-05-12 MED ORDER — HEPARIN SOD (PORK) LOCK FLUSH 100 UNIT/ML IV SOLN
500.0000 [IU] | Freq: Once | INTRAVENOUS | Status: AC
  Administered 2017-05-12: 500 [IU] via INTRAVENOUS
  Filled 2017-05-12: qty 5

## 2017-05-12 MED ORDER — SODIUM CHLORIDE 0.9 % IV SOLN: Freq: Once | INTRAVENOUS | Status: DC

## 2017-05-12 MED ORDER — SODIUM CHLORIDE 0.9 % IV SOLN
Freq: Once | INTRAVENOUS | Status: AC
  Administered 2017-05-12: 16:00:00 via INTRAVENOUS

## 2017-05-12 MED ORDER — ONDANSETRON HCL 4 MG/2ML IJ SOLN
INTRAMUSCULAR | Status: AC
  Filled 2017-05-12: qty 4

## 2017-05-12 MED ORDER — ONDANSETRON HCL 4 MG/2ML IJ SOLN
8.0000 mg | Freq: Once | INTRAMUSCULAR | Status: AC
  Administered 2017-05-12: 8 mg via INTRAVENOUS

## 2017-05-12 NOTE — Patient Instructions (Signed)
Dehydration, Adult Dehydration is when there is not enough fluid or water in your body. This happens when you lose more fluids than you take in. Dehydration can range from mild to very bad. It should be treated right away to keep it from getting very bad. Symptoms of mild dehydration may include:  Thirst.  Dry lips.  Slightly dry mouth.  Dry, warm skin.  Dizziness. Symptoms of moderate dehydration may include:  Very dry mouth.  Muscle cramps.  Dark pee (urine). Pee may be the color of tea.  Your body making less pee.  Your eyes making fewer tears.  Heartbeat that is uneven or faster than normal (palpitations).  Headache.  Light-headedness, especially when you stand up from sitting.  Fainting (syncope). Symptoms of very bad dehydration may include:  Changes in skin, such as: ? Cold and clammy skin. ? Blotchy (mottled) or pale skin. ? Skin that does not quickly return to normal after being lightly pinched and let go (poor skin turgor).  Changes in body fluids, such as: ? Feeling very thirsty. ? Your eyes making fewer tears. ? Not sweating when body temperature is high, such as in hot weather. ? Your body making very little pee.  Changes in vital signs, such as: ? Weak pulse. ? Pulse that is more than 100 beats a minute when you are sitting still. ? Fast breathing. ? Low blood pressure.  Other changes, such as: ? Sunken eyes. ? Cold hands and feet. ? Confusion. ? Lack of energy (lethargy). ? Trouble waking up from sleep. ? Short-term weight loss. ? Unconsciousness. Follow these instructions at home:  If told by your doctor, drink an ORS: ? Make an ORS by using instructions on the package. ? Start by drinking small amounts, about  cup (120 mL) every 5-10 minutes. ? Slowly drink more until you have had the amount that your doctor said to have.  Drink enough clear fluid to keep your pee clear or pale yellow. If you were told to drink an ORS, finish the ORS  first, then start slowly drinking clear fluids. Drink fluids such as: ? Water. Do not drink only water by itself. Doing that can make the salt (sodium) level in your body get too low (hyponatremia). ? Ice chips. ? Fruit juice that you have added water to (diluted). ? Low-calorie sports drinks.  Avoid: ? Alcohol. ? Drinks that have a lot of sugar. These include high-calorie sports drinks, fruit juice that does not have water added, and soda. ? Caffeine. ? Foods that are greasy or have a lot of fat or sugar.  Take over-the-counter and prescription medicines only as told by your doctor.  Do not take salt tablets. Doing that can make the salt level in your body get too high (hypernatremia).  Eat foods that have minerals (electrolytes). Examples include bananas, oranges, potatoes, tomatoes, and spinach.  Keep all follow-up visits as told by your doctor. This is important. Contact a doctor if:  You have belly (abdominal) pain that: ? Gets worse. ? Stays in one area (localizes).  You have a rash.  You have a stiff neck.  You get angry or annoyed more easily than normal (irritability).  You are more sleepy than normal.  You have a harder time waking up than normal.  You feel: ? Weak. ? Dizzy. ? Very thirsty.  You have peed (urinated) only a small amount of very dark pee during 6-8 hours. Get help right away if:  You have symptoms of   very bad dehydration.  You cannot drink fluids without throwing up (vomiting).  Your symptoms get worse with treatment.  You have a fever.  You have a very bad headache.  You are throwing up or having watery poop (diarrhea) and it: ? Gets worse. ? Does not go away.  You have blood or something green (bile) in your throw-up.  You have blood in your poop (stool). This may cause poop to look black and tarry.  You have not peed in 6-8 hours.  You pass out (faint).  Your heart rate when you are sitting still is more than 100 beats a  minute.  You have trouble breathing. This information is not intended to replace advice given to you by your health care provider. Make sure you discuss any questions you have with your health care provider. Document Released: 09/21/2009 Document Revised: 06/14/2016 Document Reviewed: 01/19/2016 Elsevier Interactive Patient Education  2018 Elsevier Inc.  

## 2017-05-12 NOTE — Telephone Encounter (Signed)
Per Julien Nordmann , I sent schedule request for IVF today.

## 2017-05-12 NOTE — Telephone Encounter (Signed)
Scheduled appt per sch message - patient aware

## 2017-05-12 NOTE — Telephone Encounter (Signed)
"  This is Maria Washington calling for my mother.  She receuved chemotherapy Wednesday.  Still very nauseated. Has not eaten in three days and very little to drink.  Weak, legs and hips are very painful, sore.  need to know what to do.  Please call [phone].."  Returned call.  "Started compazine today.  Were told to wait three days before taking the nausea pills.  Has thrown up once today after drinking water.  Small amount of emesis that looked like water or phlegmn.  She's coughing and the phlegm is what she's throwing up at least once a day that we're aware.  Has not eaten since Saturday morning.  Has lost six pounds.  Temp checked at 1:27 pm = 94.0."   Will notify provider.

## 2017-05-13 ENCOUNTER — Telehealth: Payer: Self-pay | Admitting: Medical Oncology

## 2017-05-13 ENCOUNTER — Telehealth: Payer: Self-pay | Admitting: Internal Medicine

## 2017-05-13 NOTE — Telephone Encounter (Addendum)
Pt feels better today after IVF yesterday . I reviewed labs for yesterday with pt and instructed daughter to push oral fluids on her mom. Daughter reports that pt has a new knot , "size of a small orange near the crack  right buttock" . It is uncomfortable for her to sit .

## 2017-05-13 NOTE — Telephone Encounter (Addendum)
1:45 tomorrow for Mrs. Norenberg.

## 2017-05-13 NOTE — Telephone Encounter (Signed)
Confirmed 6/6 appt at 1345 per sch msg

## 2017-05-14 ENCOUNTER — Telehealth: Payer: Self-pay | Admitting: Internal Medicine

## 2017-05-14 ENCOUNTER — Ambulatory Visit (HOSPITAL_BASED_OUTPATIENT_CLINIC_OR_DEPARTMENT_OTHER): Payer: Medicare Other | Admitting: Internal Medicine

## 2017-05-14 ENCOUNTER — Other Ambulatory Visit: Payer: Medicare Other

## 2017-05-14 ENCOUNTER — Encounter: Payer: Self-pay | Admitting: Internal Medicine

## 2017-05-14 ENCOUNTER — Ambulatory Visit (HOSPITAL_BASED_OUTPATIENT_CLINIC_OR_DEPARTMENT_OTHER): Payer: Medicare Other

## 2017-05-14 VITALS — BP 126/61 | HR 61 | Temp 98.2°F | Resp 16

## 2017-05-14 VITALS — BP 115/70 | HR 77 | Temp 97.5°F | Resp 18 | Ht 60.0 in | Wt 150.5 lb

## 2017-05-14 DIAGNOSIS — E860 Dehydration: Secondary | ICD-10-CM

## 2017-05-14 DIAGNOSIS — C3411 Malignant neoplasm of upper lobe, right bronchus or lung: Secondary | ICD-10-CM | POA: Diagnosis not present

## 2017-05-14 DIAGNOSIS — C3491 Malignant neoplasm of unspecified part of right bronchus or lung: Secondary | ICD-10-CM

## 2017-05-14 DIAGNOSIS — Z95828 Presence of other vascular implants and grafts: Secondary | ICD-10-CM

## 2017-05-14 MED ORDER — SODIUM CHLORIDE 0.9 % IV SOLN: Freq: Once | INTRAVENOUS | Status: DC

## 2017-05-14 MED ORDER — HEPARIN SOD (PORK) LOCK FLUSH 100 UNIT/ML IV SOLN
500.0000 [IU] | Freq: Once | INTRAVENOUS | Status: AC
  Administered 2017-05-14: 500 [IU]
  Filled 2017-05-14: qty 5

## 2017-05-14 MED ORDER — SODIUM CHLORIDE 0.9% FLUSH
10.0000 mL | Freq: Once | INTRAVENOUS | Status: AC
  Administered 2017-05-14: 10 mL
  Filled 2017-05-14: qty 10

## 2017-05-14 MED ORDER — ONDANSETRON HCL 4 MG/2ML IJ SOLN
INTRAMUSCULAR | Status: AC
  Filled 2017-05-14: qty 4

## 2017-05-14 MED ORDER — SODIUM CHLORIDE 0.9 % IV SOLN
Freq: Once | INTRAVENOUS | Status: AC
  Administered 2017-05-14: 15:00:00 via INTRAVENOUS

## 2017-05-14 MED ORDER — ONDANSETRON HCL 4 MG/2ML IJ SOLN
8.0000 mg | Freq: Once | INTRAMUSCULAR | Status: AC
  Administered 2017-05-14: 8 mg via INTRAVENOUS

## 2017-05-14 NOTE — Patient Instructions (Signed)
Dehydration, Adult Dehydration is a condition in which there is not enough fluid or water in the body. This happens when you lose more fluids than you take in. Important organs, such as the kidneys, brain, and heart, cannot function without a proper amount of fluids. Any loss of fluids from the body can lead to dehydration. Dehydration can range from mild to severe. This condition should be treated right away to prevent it from becoming severe. What are the causes? This condition may be caused by:  Vomiting.  Diarrhea.  Excessive sweating, such as from heat exposure or exercise.  Not drinking enough fluid, especially: ? When ill. ? While doing activity that requires a lot of energy.  Excessive urination.  Fever.  Infection.  Certain medicines, such as medicines that cause the body to lose excess fluid (diuretics).  Inability to access safe drinking water.  Reduced physical ability to get adequate water and food.  What increases the risk? This condition is more likely to develop in people:  Who have a poorly controlled long-term (chronic) illness, such as diabetes, heart disease, or kidney disease.  Who are age 65 or older.  Who are disabled.  Who live in a place with high altitude.  Who play endurance sports.  What are the signs or symptoms? Symptoms of mild dehydration may include:  Thirst.  Dry lips.  Slightly dry mouth.  Dry, warm skin.  Dizziness. Symptoms of moderate dehydration may include:  Very dry mouth.  Muscle cramps.  Dark urine. Urine may be the color of tea.  Decreased urine production.  Decreased tear production.  Heartbeat that is irregular or faster than normal (palpitations).  Headache.  Light-headedness, especially when you stand up from a sitting position.  Fainting (syncope). Symptoms of severe dehydration may include:  Changes in skin, such as: ? Cold and clammy skin. ? Blotchy (mottled) or pale skin. ? Skin that does  not quickly return to normal after being lightly pinched and released (poor skin turgor).  Changes in body fluids, such as: ? Extreme thirst. ? No tear production. ? Inability to sweat when body temperature is high, such as in hot weather. ? Very little urine production.  Changes in vital signs, such as: ? Weak pulse. ? Pulse that is more than 100 beats a minute when sitting still. ? Rapid breathing. ? Low blood pressure.  Other changes, such as: ? Sunken eyes. ? Cold hands and feet. ? Confusion. ? Lack of energy (lethargy). ? Difficulty waking up from sleep. ? Short-term weight loss. ? Unconsciousness. How is this diagnosed? This condition is diagnosed based on your symptoms and a physical exam. Blood and urine tests may be done to help confirm the diagnosis. How is this treated? Treatment for this condition depends on the severity. Mild or moderate dehydration can often be treated at home. Treatment should be started right away. Do not wait until dehydration becomes severe. Severe dehydration is an emergency and it needs to be treated in a hospital. Treatment for mild dehydration may include:  Drinking more fluids.  Replacing salts and minerals in your blood (electrolytes) that you may have lost. Treatment for moderate dehydration may include:  Drinking an oral rehydration solution (ORS). This is a drink that helps you replace fluids and electrolytes (rehydrate). It can be found at pharmacies and retail stores. Treatment for severe dehydration may include:  Receiving fluids through an IV tube.  Receiving an electrolyte solution through a feeding tube that is passed through your nose   and into your stomach (nasogastric tube, or NG tube).  Correcting any abnormalities in electrolytes.  Treating the underlying cause of dehydration. Follow these instructions at home:  If directed by your health care provider, drink an ORS: ? Make an ORS by following instructions on the  package. ? Start by drinking small amounts, about  cup (120 mL) every 5-10 minutes. ? Slowly increase how much you drink until you have taken the amount recommended by your health care provider.  Drink enough clear fluid to keep your urine clear or pale yellow. If you were told to drink an ORS, finish the ORS first, then start slowly drinking other clear fluids. Drink fluids such as: ? Water. Do not drink only water. Doing that can lead to having too little salt (sodium) in the body (hyponatremia). ? Ice chips. ? Fruit juice that you have added water to (diluted fruit juice). ? Low-calorie sports drinks.  Avoid: ? Alcohol. ? Drinks that contain a lot of sugar. These include high-calorie sports drinks, fruit juice that is not diluted, and soda. ? Caffeine. ? Foods that are greasy or contain a lot of fat or sugar.  Take over-the-counter and prescription medicines only as told by your health care provider.  Do not take sodium tablets. This can lead to having too much sodium in the body (hypernatremia).  Eat foods that contain a healthy balance of electrolytes, such as bananas, oranges, potatoes, tomatoes, and spinach.  Keep all follow-up visits as told by your health care provider. This is important. Contact a health care provider if:  You have abdominal pain that: ? Gets worse. ? Stays in one area (localizes).  You have a rash.  You have a stiff neck.  You are more irritable than usual.  You are sleepier or more difficult to wake up than usual.  You feel weak or dizzy.  You feel very thirsty.  You have urinated only a small amount of very dark urine over 6-8 hours. Get help right away if:  You have symptoms of severe dehydration.  You cannot drink fluids without vomiting.  Your symptoms get worse with treatment.  You have a fever.  You have a severe headache.  You have vomiting or diarrhea that: ? Gets worse. ? Does not go away.  You have blood or green matter  (bile) in your vomit.  You have blood in your stool. This may cause stool to look black and tarry.  You have not urinated in 6-8 hours.  You faint.  Your heart rate while sitting still is over 100 beats a minute.  You have trouble breathing. This information is not intended to replace advice given to you by your health care provider. Make sure you discuss any questions you have with your health care provider. Document Released: 11/25/2005 Document Revised: 06/21/2016 Document Reviewed: 01/19/2016 Elsevier Interactive Patient Education  2018 Elsevier Inc.  

## 2017-05-14 NOTE — Telephone Encounter (Signed)
No LOS per 6/6

## 2017-05-14 NOTE — Progress Notes (Signed)
[place] [phone]   [phone]  OFFICE PROGRESS NOTE  Lajean Manes, MD [address] [place] [address]  DIAGNOSIS: Stage IV (T2b, N3, M1 a) non-small cell lung cancer, squamous cell carcinoma presented with large right upper lobe lung mass in addition to bilateral mediastinal lymphadenopathy as well as left upper lobe lung nodule diagnosed in March 2018. PDL 1 expression is 5%.   PRIOR THERAPY: None  CURRENT THERAPY: Systemic chemotherapy with carboplatin for AUC of 5 and paclitaxel 175 MG/M2 every 3 weeks. First dose 03/24/2017. Status post 2 cycles.  INTERVAL HISTORY: Maria Washington 83 y.o. female came to the clinic today for symptom management visit. The patient has been complaining of increasing fatigue and weakness as well as dehydration and poor by mouth intake. Her daughter noticed a lump in the right buttock area and she wanted the patient to be evaluated. The patient denied having any recent fever or chills. She has no nausea, vomiting, diarrhea or constipation. She denied having any chest pain but continues to have shortness of breath at baseline and increased with exertion with no cough or hemoptysis. She is here today for evaluation and management of her symptoms.  MEDICAL HISTORY: Past Medical History:  Diagnosis Date  . Aortic atherosclerosis ([facility]) 02/10/2017  . Arthritis   . CAD (coronary artery disease), native coronary artery    3 vessel calcification noted on CT scan   . Cardiac pacemaker in situ 12/05/2011  . Chronic diastolic congestive heart failure ([facility])   . COPD (chronic obstructive pulmonary disease) ([facility])    spot on lung  . Encounter for antineoplastic chemotherapy 03/04/2017  . Goals of care, counseling/discussion 03/17/2017  . History of breast cancer    Treated with lumpectomy, radiation, and chemo sees Dr. Ralene Ok   . Hypertensive heart disease without CHF   . Hypokalemia 04/18/2017  .  Hypothyroid   . Mitral regurgitation   . Paroxysmal atrial fibrillation ([facility])    CHA2DS2VASC score 5  . Second degree heart block   . Stage IV squamous cell carcinoma of right lung ([facility]) 03/04/2017    ALLERGIES:  is allergic to clindamycin/lincomycin and penicillins.  MEDICATIONS:  Current Outpatient Prescriptions  Medication Sig Dispense Refill  . alendronate (FOSAMAX) 70 MG tablet Take 70 mg by mouth every 7 (seven) days. Monday    . apixaban (ELIQUIS) 5 MG TABS tablet Take 1 tablet (5 mg total) by mouth 2 (two) times daily. 60 tablet 3  . BIOTIN 5000 PO Take 5,000 mcg by mouth daily.     . calcium-vitamin D (OSCAL WITH D) 500-200 MG-UNIT per tablet Take 1 tablet by mouth 2 (two) times daily.      . cholecalciferol (VITAMIN D) 1000 units tablet Take 1 capsule by mouth daily.    Marland Kitchen diltiazem (CARDIZEM CD) 180 MG 24 hr capsule Take 180 mg by mouth daily.    . folic acid (FOLVITE) 1 MG tablet Take 1 mg by mouth daily.      . furosemide (LASIX) 40 MG tablet Take 40 mg by mouth daily.     Marland Kitchen levothyroxine (SYNTHROID, LEVOTHROID) 137 MCG tablet Take 137 mcg by mouth daily before breakfast.    . lidocaine-prilocaine (EMLA) cream Apply 1 application topically as needed. 30 g 0  . Omega-3 Fatty Acids (FISH OIL) 1200 MG CAPS Take 1,200 mg by mouth 2 (two) times daily.     . potassium chloride SA (K-DUR,KLOR-CON) 20  MEQ tablet Take 20-40 mEq by mouth See admin instructions. 2 tabs in the morning, and 1 tab in the evening    . prochlorperazine (COMPAZINE) 10 MG tablet Take 1 tablet (10 mg total) by mouth every 6 (six) hours as needed for nausea or vomiting. 30 tablet 0  . vitamin B-12 (CYANOCOBALAMIN) 1000 MCG tablet Take 1,000 mcg by mouth daily.       No current facility-administered medications for this visit.     SURGICAL HISTORY:  Past Surgical History:  Procedure Laterality Date  . ABDOMINAL HYSTERECTOMY    . BLADDER SUSPENSION    . BREAST LUMPECTOMY     lumpectomy  . CARPAL TUNNEL  RELEASE     left wrist  . CHOLECYSTECTOMY    . FINGER SURGERY    . IR FLUORO GUIDE PORT INSERTION RIGHT  03/12/2017  . IR US GUIDE VASC ACCESS RIGHT  03/12/2017  . PERMANENT PACEMAKER INSERTION N/A 12/04/2011   Procedure: PERMANENT PACEMAKER INSERTION;  Surgeon: Evans Lance, MD;  Location: [facility] CATH LAB;  Service: Cardiovascular;  Laterality: N/A;  . SHOULDER SURGERY    . TONSILLECTOMY AND ADENOIDECTOMY      REVIEW OF SYSTEMS:  A comprehensive review of systems was negative except for: Constitutional: positive for fatigue Respiratory: positive for cough Musculoskeletal: positive for muscle weakness   PHYSICAL EXAMINATION: General appearance: alert, cooperative, fatigued and no distress Head: Normocephalic, without obvious abnormality, atraumatic Neck: no adenopathy, no JVD, supple, symmetrical, trachea midline and thyroid not enlarged, symmetric, no tenderness/mass/nodules Lymph nodes: Cervical, supraclavicular, and axillary nodes normal. Resp: clear to auscultation bilaterally Back: symmetric, no curvature. ROM normal. No CVA tenderness. Cardio: regular rate and rhythm, S1, S2 normal, no murmur, click, rub or gallop GI: soft, non-tender; bowel sounds normal; no masses,  no organomegaly Extremities: extremities normal, atraumatic, no cyanosis or edema  ECOG PERFORMANCE STATUS: 1 - Symptomatic but completely ambulatory  Blood pressure 115/70, pulse 77, temperature 97.5 F (36.4 C), temperature source Oral, resp. rate 18, height 5' (1.524 m), weight 150 lb 8 oz (68.3 kg), SpO2 100 %.  LABORATORY DATA: Lab Results  Component Value Date   WBC 4.6 05/12/2017   HGB 12.3 05/12/2017   HCT 36.4 05/12/2017   MCV 93.6 05/12/2017   PLT 143 (L) 05/12/2017      Chemistry      Component Value Date/Time   NA 138 05/12/2017 1532   K 4.5 05/12/2017 1532   CL 99 (L) 09/29/2015 0544   CO2 23 05/12/2017 1532   BUN 35.2 (H) 05/12/2017 1532   CREATININE 1.4 (H) 05/12/2017 1532      Component  Value Date/Time   CALCIUM 10.2 05/12/2017 1532   ALKPHOS 71 05/12/2017 1532   AST 15 05/12/2017 1532   ALT 21 05/12/2017 1532   BILITOT 1.05 05/12/2017 1532       RADIOGRAPHIC STUDIES: No results found.  ASSESSMENT AND PLAN:  This is a very pleasant 83 years old white female with a stage IV non-small cell lung cancer, squamous cell carcinoma and PDL 1 expression of 5%. She is currently undergoing treatment with systemic chemotherapy with carboplatin and paclitaxel is status post 3 cycles. She continues to complain of increasing fatigue and weakness as well as lack of appetite and poor by mouth intake. The concerning lesion in the right buttock area was not palpable on the today's exam and probably the daughter just felt her muscle and bone in that area. For the dehydration, I will arrange for  the patient to receive 1 L of normal saline with 8 mg IV of Zofran today. I will see her back for follow-up visit as previously schedule after repeating CT scan of the chest, abdomen and pelvis for restaging of her disease. The patient was advised to call immediately if she has any concerning symptoms in the interval. The patient voices understanding of current disease status and treatment options and is in agreement with the current care plan. All questions were answered. The patient knows to call the clinic with any problems, questions or concerns. We can certainly see the patient much sooner if necessary. I spent 10 minutes counseling the patient face to face. The total time spent in the appointment was 15 minutes. Disclaimer: This note was dictated with voice recognition software. Similar sounding words can inadvertently be transcribed and may not be corrected upon review.

## 2017-05-16 ENCOUNTER — Telehealth: Payer: Self-pay | Admitting: Medical Oncology

## 2017-05-16 NOTE — Telephone Encounter (Signed)
Pt thought she was supposed to get a PET scan not CT scan.

## 2017-05-17 NOTE — Telephone Encounter (Signed)
CT scan

## 2017-05-20 NOTE — Telephone Encounter (Signed)
I left message for pt that [facility] ordered CT scan and that is what he wants.

## 2017-05-21 ENCOUNTER — Other Ambulatory Visit (HOSPITAL_BASED_OUTPATIENT_CLINIC_OR_DEPARTMENT_OTHER): Payer: Medicare Other

## 2017-05-21 ENCOUNTER — Ambulatory Visit (HOSPITAL_BASED_OUTPATIENT_CLINIC_OR_DEPARTMENT_OTHER): Payer: Medicare Other

## 2017-05-21 ENCOUNTER — Other Ambulatory Visit: Payer: Self-pay | Admitting: Emergency Medicine

## 2017-05-21 ENCOUNTER — Other Ambulatory Visit: Payer: Self-pay | Admitting: Internal Medicine

## 2017-05-21 VITALS — BP 131/64 | HR 70 | Temp 97.2°F | Resp 18

## 2017-05-21 DIAGNOSIS — Z5189 Encounter for other specified aftercare: Secondary | ICD-10-CM | POA: Diagnosis not present

## 2017-05-21 DIAGNOSIS — T451X5A Adverse effect of antineoplastic and immunosuppressive drugs, initial encounter: Secondary | ICD-10-CM

## 2017-05-21 DIAGNOSIS — C3411 Malignant neoplasm of upper lobe, right bronchus or lung: Secondary | ICD-10-CM | POA: Diagnosis not present

## 2017-05-21 DIAGNOSIS — C3491 Malignant neoplasm of unspecified part of right bronchus or lung: Secondary | ICD-10-CM

## 2017-05-21 DIAGNOSIS — D701 Agranulocytosis secondary to cancer chemotherapy: Secondary | ICD-10-CM

## 2017-05-21 DIAGNOSIS — Z452 Encounter for adjustment and management of vascular access device: Secondary | ICD-10-CM | POA: Diagnosis not present

## 2017-05-21 DIAGNOSIS — Z95828 Presence of other vascular implants and grafts: Secondary | ICD-10-CM

## 2017-05-21 LAB — COMPREHENSIVE METABOLIC PANEL
ALK PHOS: 60 U/L (ref 40–150)
ALT: 16 U/L (ref 0–55)
ANION GAP: 9 meq/L (ref 3–11)
AST: 13 U/L (ref 5–34)
Albumin: 3.3 g/dL — ABNORMAL LOW (ref 3.5–5.0)
BUN: 11.6 mg/dL (ref 7.0–26.0)
CO2: 19 meq/L — AB (ref 22–29)
Calcium: 8.3 mg/dL — ABNORMAL LOW (ref 8.4–10.4)
Chloride: 116 mEq/L — ABNORMAL HIGH (ref 98–109)
Creatinine: 0.9 mg/dL (ref 0.6–1.1)
EGFR: 59 mL/min/{1.73_m2} — AB (ref >90)
GLUCOSE: 139 mg/dL (ref 70–140)
POTASSIUM: 3.4 meq/L — AB (ref 3.5–5.1)
SODIUM: 144 meq/L (ref 136–145)
Total Bilirubin: 0.37 mg/dL (ref 0.20–1.20)
Total Protein: 6.3 g/dL — ABNORMAL LOW (ref 6.4–8.3)

## 2017-05-21 LAB — CBC WITH DIFFERENTIAL/PLATELET
BASO%: 1.3 % (ref 0.0–2.0)
BASOS ABS: 0 10*3/uL (ref 0.0–0.1)
EOS ABS: 0 10*3/uL (ref 0.0–0.5)
EOS%: 1.3 % (ref 0.0–7.0)
HCT: 28.9 % — ABNORMAL LOW (ref 34.8–46.6)
HGB: 9.7 g/dL — ABNORMAL LOW (ref 11.6–15.9)
LYMPH%: 51.3 % — AB (ref 14.0–49.7)
MCH: 31.8 pg (ref 25.1–34.0)
MCHC: 33.6 g/dL (ref 31.5–36.0)
MCV: 94.8 fL (ref 79.5–101.0)
MONO#: 0.7 10*3/uL (ref 0.1–0.9)
MONO%: 27.3 % — AB (ref 0.0–14.0)
NEUT#: 0.5 10*3/uL — CL (ref 1.5–6.5)
NEUT%: 18.8 % — AB (ref 38.4–76.8)
PLATELETS: 132 10*3/uL — AB (ref 145–400)
RBC: 3.05 10*6/uL — AB (ref 3.70–5.45)
RDW: 21.4 % — ABNORMAL HIGH (ref 11.2–14.5)
WBC: 2.4 10*3/uL — ABNORMAL LOW (ref 3.9–10.3)
lymph#: 1.2 10*3/uL (ref 0.9–3.3)
nRBC: 0 % (ref 0–0)

## 2017-05-21 MED ORDER — HEPARIN SOD (PORK) LOCK FLUSH 100 UNIT/ML IV SOLN
500.0000 [IU] | Freq: Once | INTRAVENOUS | Status: AC
  Administered 2017-05-21: 500 [IU]
  Filled 2017-05-21: qty 5

## 2017-05-21 MED ORDER — SODIUM CHLORIDE 0.9% FLUSH
10.0000 mL | Freq: Once | INTRAVENOUS | Status: AC
  Administered 2017-05-21: 10 mL
  Filled 2017-05-21: qty 10

## 2017-05-21 MED ORDER — TBO-FILGRASTIM 300 MCG/0.5ML ~~LOC~~ SOSY
300.0000 ug | PREFILLED_SYRINGE | Freq: Every morning | SUBCUTANEOUS | Status: DC
  Administered 2017-05-21: 300 ug via SUBCUTANEOUS
  Filled 2017-05-21: qty 0.5

## 2017-05-21 NOTE — Patient Instructions (Signed)
Tbo-Filgrastim injection What is this medicine? TBO-FILGRASTIM (T B O fil GRA stim) is a granulocyte colony-stimulating factor that stimulates the growth of neutrophils, a type of white blood cell important in the body's fight against infection. It is used to reduce the incidence of fever and infection in patients with certain types of cancer who are receiving chemotherapy that affects the bone marrow. This medicine may be used for other purposes; ask your health care provider or pharmacist if you have questions. COMMON BRAND NAME(S): Granix What should I tell my health care provider before I take this medicine? They need to know if you have any of these conditions: -bone scan or tests planned -kidney disease -sickle cell anemia -an unusual or allergic reaction to tbo-filgrastim, filgrastim, pegfilgrastim, other medicines, foods, dyes, or preservatives -pregnant or trying to get pregnant -breast-feeding How should I use this medicine? This medicine is for injection under the skin. If you get this medicine at home, you will be taught how to prepare and give this medicine. Refer to the Instructions for Use that come with your medication packaging. Use exactly as directed. Take your medicine at regular intervals. Do not take your medicine more often than directed. It is important that you put your used needles and syringes in a special sharps container. Do not put them in a trash can. If you do not have a sharps container, call your pharmacist or healthcare provider to get one. Talk to your pediatrician regarding the use of this medicine in children. Special care may be needed. Overdosage: If you think you have taken too much of this medicine contact a poison control center or emergency room at once. NOTE: This medicine is only for you. Do not share this medicine with others. What if I miss a dose? It is important not to miss your dose. Call your doctor or health care professional if you miss a  dose. What may interact with this medicine? This medicine may interact with the following medications: -medicines that may cause a release of neutrophils, such as lithium This list may not describe all possible interactions. Give your health care provider a list of all the medicines, herbs, non-prescription drugs, or dietary supplements you use. Also tell them if you smoke, drink alcohol, or use illegal drugs. Some items may interact with your medicine. What should I watch for while using this medicine? You may need blood work done while you are taking this medicine. What side effects may I notice from receiving this medicine? Side effects that you should report to your doctor or health care professional as soon as possible: -allergic reactions like skin rash, itching or hives, swelling of the face, lips, or tongue -blood in the urine -dark urine -dizziness -fast heartbeat -feeling faint -shortness of breath or breathing problems -signs and symptoms of infection like fever or chills; cough; or sore throat -signs and symptoms of kidney injury like trouble passing urine or change in the amount of urine -stomach or side pain, or pain at the shoulder -sweating -swelling of the legs, ankles, or abdomen -tiredness Side effects that usually do not require medical attention (report to your doctor or health care professional if they continue or are bothersome): -bone pain -headache -muscle pain -vomiting This list may not describe all possible side effects. Call your doctor for medical advice about side effects. You may report side effects to FDA at 1-800-FDA-1088. Where should I keep my medicine? Keep out of the reach of children. Store in a refrigerator between   2 and 8 degrees C (36 and 46 degrees F). Keep in carton to protect from light. Throw away this medicine if it is left out of the refrigerator for more than 5 consecutive days. Throw away any unused medicine after the expiration  date. NOTE: This sheet is a summary. It may not cover all possible information. If you have questions about this medicine, talk to your doctor, pharmacist, or health care provider.  2018 Elsevier/Gold Standard (2016-01-15 19:07:04)  

## 2017-05-21 NOTE — Patient Instructions (Signed)

## 2017-05-22 ENCOUNTER — Ambulatory Visit (HOSPITAL_BASED_OUTPATIENT_CLINIC_OR_DEPARTMENT_OTHER): Payer: Medicare Other

## 2017-05-22 VITALS — BP 151/69 | HR 88 | Temp 97.4°F | Resp 20

## 2017-05-22 DIAGNOSIS — C3491 Malignant neoplasm of unspecified part of right bronchus or lung: Secondary | ICD-10-CM

## 2017-05-22 DIAGNOSIS — Z95828 Presence of other vascular implants and grafts: Secondary | ICD-10-CM

## 2017-05-22 DIAGNOSIS — C3411 Malignant neoplasm of upper lobe, right bronchus or lung: Secondary | ICD-10-CM | POA: Diagnosis not present

## 2017-05-22 DIAGNOSIS — Z5189 Encounter for other specified aftercare: Secondary | ICD-10-CM

## 2017-05-22 MED ORDER — TBO-FILGRASTIM 300 MCG/0.5ML ~~LOC~~ SOSY
300.0000 ug | PREFILLED_SYRINGE | Freq: Every morning | SUBCUTANEOUS | Status: DC
  Administered 2017-05-22: 300 ug via SUBCUTANEOUS
  Filled 2017-05-22: qty 0.5

## 2017-05-22 NOTE — Patient Instructions (Signed)
Tbo-Filgrastim injection What is this medicine? TBO-FILGRASTIM (T B O fil GRA stim) is a granulocyte colony-stimulating factor that stimulates the growth of neutrophils, a type of white blood cell important in the body's fight against infection. It is used to reduce the incidence of fever and infection in patients with certain types of cancer who are receiving chemotherapy that affects the bone marrow. This medicine may be used for other purposes; ask your health care provider or pharmacist if you have questions. COMMON BRAND NAME(S): Granix What should I tell my health care provider before I take this medicine? They need to know if you have any of these conditions: -bone scan or tests planned -kidney disease -sickle cell anemia -an unusual or allergic reaction to tbo-filgrastim, filgrastim, pegfilgrastim, other medicines, foods, dyes, or preservatives -pregnant or trying to get pregnant -breast-feeding How should I use this medicine? This medicine is for injection under the skin. If you get this medicine at home, you will be taught how to prepare and give this medicine. Refer to the Instructions for Use that come with your medication packaging. Use exactly as directed. Take your medicine at regular intervals. Do not take your medicine more often than directed. It is important that you put your used needles and syringes in a special sharps container. Do not put them in a trash can. If you do not have a sharps container, call your pharmacist or healthcare provider to get one. Talk to your pediatrician regarding the use of this medicine in children. Special care may be needed. Overdosage: If you think you have taken too much of this medicine contact a poison control center or emergency room at once. NOTE: This medicine is only for you. Do not share this medicine with others. What if I miss a dose? It is important not to miss your dose. Call your doctor or health care professional if you miss a  dose. What may interact with this medicine? This medicine may interact with the following medications: -medicines that may cause a release of neutrophils, such as lithium This list may not describe all possible interactions. Give your health care provider a list of all the medicines, herbs, non-prescription drugs, or dietary supplements you use. Also tell them if you smoke, drink alcohol, or use illegal drugs. Some items may interact with your medicine. What should I watch for while using this medicine? You may need blood work done while you are taking this medicine. What side effects may I notice from receiving this medicine? Side effects that you should report to your doctor or health care professional as soon as possible: -allergic reactions like skin rash, itching or hives, swelling of the face, lips, or tongue -blood in the urine -dark urine -dizziness -fast heartbeat -feeling faint -shortness of breath or breathing problems -signs and symptoms of infection like fever or chills; cough; or sore throat -signs and symptoms of kidney injury like trouble passing urine or change in the amount of urine -stomach or side pain, or pain at the shoulder -sweating -swelling of the legs, ankles, or abdomen -tiredness Side effects that usually do not require medical attention (report to your doctor or health care professional if they continue or are bothersome): -bone pain -headache -muscle pain -vomiting This list may not describe all possible side effects. Call your doctor for medical advice about side effects. You may report side effects to FDA at 1-800-FDA-1088. Where should I keep my medicine? Keep out of the reach of children. Store in a refrigerator between   2 and 8 degrees C (36 and 46 degrees F). Keep in carton to protect from light. Throw away this medicine if it is left out of the refrigerator for more than 5 consecutive days. Throw away any unused medicine after the expiration  date. NOTE: This sheet is a summary. It may not cover all possible information. If you have questions about this medicine, talk to your doctor, pharmacist, or health care provider.  2018 Elsevier/Gold Standard (2016-01-15 19:07:04)  

## 2017-05-26 ENCOUNTER — Encounter (HOSPITAL_COMMUNITY): Payer: Self-pay

## 2017-05-26 ENCOUNTER — Ambulatory Visit (HOSPITAL_COMMUNITY)
Admission: RE | Admit: 2017-05-26 | Discharge: 2017-05-26 | Disposition: A | Discharge status: Home / Self Care | Payer: Medicare Other | Source: Ambulatory Visit | Attending: Internal Medicine | Admitting: Internal Medicine

## 2017-05-26 DIAGNOSIS — I700 Atherosclerosis of aorta: Secondary | ICD-10-CM | POA: Insufficient documentation

## 2017-05-26 DIAGNOSIS — Z90710 Acquired absence of both cervix and uterus: Secondary | ICD-10-CM | POA: Insufficient documentation

## 2017-05-26 DIAGNOSIS — K4090 Unilateral inguinal hernia, without obstruction or gangrene, not specified as recurrent: Secondary | ICD-10-CM | POA: Insufficient documentation

## 2017-05-26 DIAGNOSIS — Z5111 Encounter for antineoplastic chemotherapy: Secondary | ICD-10-CM

## 2017-05-26 DIAGNOSIS — Z950 Presence of cardiac pacemaker: Secondary | ICD-10-CM | POA: Diagnosis not present

## 2017-05-26 DIAGNOSIS — C3491 Malignant neoplasm of unspecified part of right bronchus or lung: Secondary | ICD-10-CM

## 2017-05-26 DIAGNOSIS — I2510 Atherosclerotic heart disease of native coronary artery without angina pectoris: Secondary | ICD-10-CM | POA: Diagnosis not present

## 2017-05-26 DIAGNOSIS — N281 Cyst of kidney, acquired: Secondary | ICD-10-CM | POA: Diagnosis not present

## 2017-05-26 MED ORDER — IOPAMIDOL (ISOVUE-300) INJECTION 61%
100.0000 mL | Freq: Once | INTRAVENOUS | Status: AC | PRN
  Administered 2017-05-26: 100 mL via INTRAVENOUS

## 2017-05-28 ENCOUNTER — Ambulatory Visit (HOSPITAL_BASED_OUTPATIENT_CLINIC_OR_DEPARTMENT_OTHER): Payer: Medicare Other | Admitting: Nurse Practitioner

## 2017-05-28 ENCOUNTER — Other Ambulatory Visit (HOSPITAL_BASED_OUTPATIENT_CLINIC_OR_DEPARTMENT_OTHER): Payer: Medicare Other

## 2017-05-28 ENCOUNTER — Ambulatory Visit: Payer: Medicare Other

## 2017-05-28 ENCOUNTER — Ambulatory Visit (HOSPITAL_BASED_OUTPATIENT_CLINIC_OR_DEPARTMENT_OTHER): Payer: Medicare Other

## 2017-05-28 ENCOUNTER — Encounter: Payer: Self-pay | Admitting: Nurse Practitioner

## 2017-05-28 VITALS — BP 141/62 | HR 78 | Temp 97.7°F | Resp 18 | Ht 60.0 in | Wt 155.4 lb

## 2017-05-28 DIAGNOSIS — C3411 Malignant neoplasm of upper lobe, right bronchus or lung: Secondary | ICD-10-CM

## 2017-05-28 DIAGNOSIS — Z5111 Encounter for antineoplastic chemotherapy: Secondary | ICD-10-CM

## 2017-05-28 DIAGNOSIS — Z95828 Presence of other vascular implants and grafts: Secondary | ICD-10-CM

## 2017-05-28 DIAGNOSIS — J449 Chronic obstructive pulmonary disease, unspecified: Secondary | ICD-10-CM

## 2017-05-28 DIAGNOSIS — I480 Paroxysmal atrial fibrillation: Secondary | ICD-10-CM

## 2017-05-28 DIAGNOSIS — C3491 Malignant neoplasm of unspecified part of right bronchus or lung: Secondary | ICD-10-CM

## 2017-05-28 DIAGNOSIS — Z7901 Long term (current) use of anticoagulants: Secondary | ICD-10-CM

## 2017-05-28 LAB — COMPREHENSIVE METABOLIC PANEL
ALBUMIN: 3.3 g/dL — AB (ref 3.5–5.0)
ALK PHOS: 73 U/L (ref 40–150)
ALT: 16 U/L (ref 0–55)
AST: 11 U/L (ref 5–34)
Anion Gap: 15 mEq/L — ABNORMAL HIGH (ref 3–11)
BUN: 13.2 mg/dL (ref 7.0–26.0)
CO2: 22 meq/L (ref 22–29)
CREATININE: 0.9 mg/dL (ref 0.6–1.1)
Calcium: 8.8 mg/dL (ref 8.4–10.4)
Chloride: 107 mEq/L (ref 98–109)
EGFR: 56 mL/min/{1.73_m2} — AB (ref >90)
GLUCOSE: 232 mg/dL — AB (ref 70–140)
Potassium: 3.3 mEq/L — ABNORMAL LOW (ref 3.5–5.1)
SODIUM: 143 meq/L (ref 136–145)
TOTAL PROTEIN: 6.9 g/dL (ref 6.4–8.3)
Total Bilirubin: 0.63 mg/dL (ref 0.20–1.20)

## 2017-05-28 LAB — CBC WITH DIFFERENTIAL/PLATELET
BASO%: 0.6 % (ref 0.0–2.0)
Basophils Absolute: 0 10*3/uL (ref 0.0–0.1)
EOS ABS: 0 10*3/uL (ref 0.0–0.5)
EOS%: 0.3 % (ref 0.0–7.0)
HCT: 31.3 % — ABNORMAL LOW (ref 34.8–46.6)
HEMOGLOBIN: 10.6 g/dL — AB (ref 11.6–15.9)
LYMPH%: 21.2 % (ref 14.0–49.7)
MCH: 33.2 pg (ref 25.1–34.0)
MCHC: 34 g/dL (ref 31.5–36.0)
MCV: 97.8 fL (ref 79.5–101.0)
MONO#: 0.5 10*3/uL (ref 0.1–0.9)
MONO%: 12.3 % (ref 0.0–14.0)
NEUT#: 2.9 10*3/uL (ref 1.5–6.5)
NEUT%: 65.6 % (ref 38.4–76.8)
PLATELETS: 134 10*3/uL — AB (ref 145–400)
RBC: 3.2 10*6/uL — AB (ref 3.70–5.45)
RDW: 23 % — AB (ref 11.2–14.5)
WBC: 4.4 10*3/uL (ref 3.9–10.3)
lymph#: 0.9 10*3/uL (ref 0.9–3.3)

## 2017-05-28 MED ORDER — SODIUM CHLORIDE 0.9 % IV SOLN
20.0000 mg | Freq: Once | INTRAVENOUS | Status: AC
  Administered 2017-05-28: 20 mg via INTRAVENOUS
  Filled 2017-05-28: qty 2

## 2017-05-28 MED ORDER — PACLITAXEL CHEMO INJECTION 300 MG/50ML
150.0000 mg/m2 | Freq: Once | INTRAVENOUS | Status: AC
  Administered 2017-05-28: 264 mg via INTRAVENOUS
  Filled 2017-05-28: qty 44

## 2017-05-28 MED ORDER — HEPARIN SOD (PORK) LOCK FLUSH 100 UNIT/ML IV SOLN
500.0000 [IU] | Freq: Once | INTRAVENOUS | Status: AC | PRN
  Administered 2017-05-28: 500 [IU]
  Filled 2017-05-28: qty 5

## 2017-05-28 MED ORDER — FAMOTIDINE IN NACL 20-0.9 MG/50ML-% IV SOLN
INTRAVENOUS | Status: AC
  Filled 2017-05-28: qty 50

## 2017-05-28 MED ORDER — FAMOTIDINE IN NACL 20-0.9 MG/50ML-% IV SOLN
20.0000 mg | Freq: Once | INTRAVENOUS | Status: AC
  Administered 2017-05-28: 20 mg via INTRAVENOUS

## 2017-05-28 MED ORDER — SODIUM CHLORIDE 0.9% FLUSH
10.0000 mL | Freq: Once | INTRAVENOUS | Status: AC
  Administered 2017-05-28: 10 mL
  Filled 2017-05-28: qty 10

## 2017-05-28 MED ORDER — SODIUM CHLORIDE 0.9% FLUSH
10.0000 mL | INTRAVENOUS | Status: DC | PRN
  Administered 2017-05-28: 10 mL
  Filled 2017-05-28: qty 10

## 2017-05-28 MED ORDER — DIPHENHYDRAMINE HCL 50 MG/ML IJ SOLN
50.0000 mg | Freq: Once | INTRAMUSCULAR | Status: AC
  Administered 2017-05-28: 50 mg via INTRAVENOUS

## 2017-05-28 MED ORDER — SODIUM CHLORIDE 0.9 % IV SOLN
Freq: Once | INTRAVENOUS | Status: AC
  Administered 2017-05-28: 12:00:00 via INTRAVENOUS

## 2017-05-28 MED ORDER — DIPHENHYDRAMINE HCL 50 MG/ML IJ SOLN
INTRAMUSCULAR | Status: AC
  Filled 2017-05-28: qty 1

## 2017-05-28 MED ORDER — SODIUM CHLORIDE 0.9 % IV SOLN
286.4000 mg | Freq: Once | INTRAVENOUS | Status: AC
  Administered 2017-05-28: 290 mg via INTRAVENOUS
  Filled 2017-05-28: qty 29

## 2017-05-28 MED ORDER — PALONOSETRON HCL INJECTION 0.25 MG/5ML
INTRAVENOUS | Status: AC
  Filled 2017-05-28: qty 5

## 2017-05-28 MED ORDER — PALONOSETRON HCL INJECTION 0.25 MG/5ML
0.2500 mg | Freq: Once | INTRAVENOUS | Status: AC
  Administered 2017-05-28: 0.25 mg via INTRAVENOUS

## 2017-05-28 NOTE — Patient Instructions (Signed)
[facility] Discharge Instructions for Patients Receiving Chemotherapy  Today you received the following chemotherapy agents Taxol/Carboplatin  To help prevent nausea and vomiting after your treatment, we encourage you to take your nausea medication    If you develop nausea and vomiting that is not controlled by your nausea medication, call the clinic.   BELOW ARE SYMPTOMS THAT SHOULD BE REPORTED IMMEDIATELY:  *FEVER GREATER THAN 100.5 F  *CHILLS WITH OR WITHOUT FEVER  NAUSEA AND VOMITING THAT IS NOT CONTROLLED WITH YOUR NAUSEA MEDICATION  *UNUSUAL SHORTNESS OF BREATH  *UNUSUAL BRUISING OR BLEEDING  TENDERNESS IN MOUTH AND THROAT WITH OR WITHOUT PRESENCE OF ULCERS  *URINARY PROBLEMS  *BOWEL PROBLEMS  UNUSUAL RASH Items with * indicate a potential emergency and should be followed up as soon as possible.  Feel free to call the clinic you have any questions or concerns. The clinic phone number is [phone].  Please show the CHEMO ALERT CARD at check-in to the Emergency Department and triage nurse.   

## 2017-05-28 NOTE — Progress Notes (Addendum)
[place] OFFICE PROGRESS NOTE    DIAGNOSIS: Stage IV (T2b, N3, M1 a) non-small cell lung cancer, squamous cell carcinoma presented with large right upper lobe lung mass in addition to bilateral mediastinal lymphadenopathy as well as left upper lobe lung nodule diagnosed in March 2018. PDL 1 expression is 5%.   PRIOR THERAPY: None  CURRENT THERAPY: Systemic chemotherapy with carboplatin for AUC of 5 and paclitaxel 175 MG/M2 every 3 weeks. First dose 03/24/2017. Status post 3 cycles.  INTERVAL HISTORY:   Maria Washington returns as scheduled. She completed cycle 3 carboplatin/Taxol 05/07/2017. She denies nausea/vomiting. No mouth sores. No diarrhea. She notes fatigue for 2-3 days after each treatment. No numbness or tingling in her hands or feet. She does note that the fingertips and toes feel "different". This does not interfere with activity. She has stable dyspnea on exertion. No cough. No fever. She has recently noted a "nose drip and watery eyes" and a slight decrease in hearing.  Objective:  Vital signs in last 24 hours:  There were no vitals taken for this visit.    HEENT: No thrush or ulcers. Mild conjunctival erythema bilaterally. Resp: Lungs clear bilaterally. Cardio: Regular rate and rhythm. GI: Abdomen soft and nontender. No hepatomegaly. Vascular: No leg edema. Neuro: Alert and oriented.  Port-A-Cath without erythema.  Lab Results:  Lab Results  Component Value Date   WBC 4.4 05/28/2017   HGB 10.6 (L) 05/28/2017   HCT 31.3 (L) 05/28/2017   MCV 97.8 05/28/2017   PLT 134 (L) 05/28/2017   NEUTROABS 2.9 05/28/2017    Imaging:  No results found.  Medications: I have reviewed the patient's current medications.  Assessment/Plan: 1. Stage IV non-small cell lung cancer, squamous cell carcinoma with PDL1 expression 5%, currently undergoing systemic chemotherapy with carboplatin and paclitaxel status post 3 cycles. Restaging CT evaluation 05/26/2017 with  interval decrease in the right upper lobe mass, left upper lobe pulmonary nodule and prevascular metastatic lymph nodes. 2. "Nose drip and watery eyes". Question related to chemotherapy. Chemotherapy doses adjusted beginning with cycle 4.   Disposition: Maria Washington appears stable. She has completed 3 cycles of carboplatin/Taxol. The recent restaging CT evaluation shows improvement. Dr. Julien Nordmann recommends continuation of carboplatin/Taxol with cycle 4 today.  Due to complaints of "nose drip and watery eyes" the chemotherapy doses will be reduced beginning today.  She will return for a follow-up visit and cycle 5 carboplatin/Taxol in 3 weeks. She will contact the office in the interim with any problems.  Patient seen with Dr. Julien Nordmann.    Ned Card ANP/GNP-BC   05/28/2017  11:21 AM  ADDENDUM: Hematology/Oncology Attending: Had a face to face encounter with the patient today. I recommended her care plan. This is a very pleasant 83 years old white female with a stage IV non-small cell lung cancer, squamous cell carcinoma currently undergoing systemic chemotherapy with carboplatin and paclitaxel is status post 3 cycles. She has been tolerating her treatment well except for fatigue and weakness. She had repeat CT scan of the chest, abdomen and pelvis performed recently. I personally and independently reviewed the scan images and discuss the results and showed the images to the patient and her daughter today. Her scan showed significant improvement in her disease. I recommended for the patient to continue her current treatment with carboplatin and paclitaxel but I will reduce the dose of carboplatin to AUC of 4 and paclitaxel to 150 MG/M2 every 3 weeks. I recommended for the patient to proceed with  cycle #4 today as scheduled. I would see her back for follow-up visit in 3 weeks for reevaluation before starting cycle #6. The patient will continue her other home medications. She was advised to call  immediately if she has any concerning symptoms in the interval. Disclaimer: This note was dictated with voice recognition software. Similar sounding words can inadvertently be transcribed and may be missed upon review. Maria Washington., MD 05/28/17

## 2017-06-04 ENCOUNTER — Telehealth: Payer: Self-pay | Admitting: Internal Medicine

## 2017-06-04 ENCOUNTER — Other Ambulatory Visit: Payer: Self-pay | Admitting: *Deleted

## 2017-06-04 ENCOUNTER — Other Ambulatory Visit (HOSPITAL_BASED_OUTPATIENT_CLINIC_OR_DEPARTMENT_OTHER): Payer: Medicare Other

## 2017-06-04 ENCOUNTER — Encounter: Payer: Self-pay | Admitting: Internal Medicine

## 2017-06-04 ENCOUNTER — Ambulatory Visit (HOSPITAL_BASED_OUTPATIENT_CLINIC_OR_DEPARTMENT_OTHER): Payer: Medicare Other | Admitting: Internal Medicine

## 2017-06-04 ENCOUNTER — Ambulatory Visit (HOSPITAL_BASED_OUTPATIENT_CLINIC_OR_DEPARTMENT_OTHER): Payer: Medicare Other

## 2017-06-04 ENCOUNTER — Telehealth: Payer: Self-pay | Admitting: Medical Oncology

## 2017-06-04 ENCOUNTER — Ambulatory Visit (HOSPITAL_COMMUNITY)
Admission: RE | Admit: 2017-06-04 | Discharge: 2017-06-04 | Disposition: A | Discharge status: Home / Self Care | Payer: Medicare Other | Source: Ambulatory Visit | Attending: Internal Medicine | Admitting: Internal Medicine

## 2017-06-04 VITALS — BP 93/70 | HR 80 | Temp 98.0°F | Resp 18 | Ht 60.0 in | Wt 144.9 lb

## 2017-06-04 DIAGNOSIS — Z95828 Presence of other vascular implants and grafts: Secondary | ICD-10-CM

## 2017-06-04 DIAGNOSIS — E860 Dehydration: Secondary | ICD-10-CM | POA: Diagnosis not present

## 2017-06-04 DIAGNOSIS — C3491 Malignant neoplasm of unspecified part of right bronchus or lung: Secondary | ICD-10-CM

## 2017-06-04 DIAGNOSIS — Z452 Encounter for adjustment and management of vascular access device: Secondary | ICD-10-CM | POA: Diagnosis not present

## 2017-06-04 DIAGNOSIS — R5383 Other fatigue: Secondary | ICD-10-CM

## 2017-06-04 DIAGNOSIS — C3411 Malignant neoplasm of upper lobe, right bronchus or lung: Secondary | ICD-10-CM

## 2017-06-04 DIAGNOSIS — R630 Anorexia: Secondary | ICD-10-CM

## 2017-06-04 HISTORY — DX: Dehydration: E86.0

## 2017-06-04 LAB — CBC WITH DIFFERENTIAL/PLATELET
BASO%: 0.8 % (ref 0.0–2.0)
BASOS ABS: 0 10*3/uL (ref 0.0–0.1)
EOS%: 0.5 % (ref 0.0–7.0)
Eosinophils Absolute: 0 10*3/uL (ref 0.0–0.5)
HEMATOCRIT: 34.8 % (ref 34.8–46.6)
HEMOGLOBIN: 11.7 g/dL (ref 11.6–15.9)
LYMPH#: 0.8 10*3/uL — AB (ref 0.9–3.3)
LYMPH%: 20.6 % (ref 14.0–49.7)
MCH: 32.6 pg (ref 25.1–34.0)
MCHC: 33.6 g/dL (ref 31.5–36.0)
MCV: 96.9 fL (ref 79.5–101.0)
MONO#: 0.1 10*3/uL (ref 0.1–0.9)
MONO%: 3.2 % (ref 0.0–14.0)
NEUT#: 2.8 10*3/uL (ref 1.5–6.5)
NEUT%: 74.9 % (ref 38.4–76.8)
Platelets: 128 10*3/uL — ABNORMAL LOW (ref 145–400)
RBC: 3.59 10*6/uL — ABNORMAL LOW (ref 3.70–5.45)
RDW: 19.1 % — AB (ref 11.2–14.5)
WBC: 3.8 10*3/uL — AB (ref 3.9–10.3)

## 2017-06-04 LAB — COMPREHENSIVE METABOLIC PANEL
ALBUMIN: 4.1 g/dL (ref 3.5–5.0)
ALT: 24 U/L (ref 0–55)
AST: 15 U/L (ref 5–34)
Alkaline Phosphatase: 71 U/L (ref 40–150)
Anion Gap: 16 mEq/L — ABNORMAL HIGH (ref 3–11)
BUN: 55.4 mg/dL — AB (ref 7.0–26.0)
CHLORIDE: 102 meq/L (ref 98–109)
CO2: 20 mEq/L — ABNORMAL LOW (ref 22–29)
CREATININE: 1.7 mg/dL — AB (ref 0.6–1.1)
Calcium: 10.7 mg/dL — ABNORMAL HIGH (ref 8.4–10.4)
EGFR: 27 mL/min/{1.73_m2} — ABNORMAL LOW (ref >90)
GLUCOSE: 156 mg/dL — AB (ref 70–140)
Potassium: 4.2 mEq/L (ref 3.5–5.1)
SODIUM: 138 meq/L (ref 136–145)
Total Bilirubin: 0.87 mg/dL (ref 0.20–1.20)
Total Protein: 8 g/dL (ref 6.4–8.3)

## 2017-06-04 MED ORDER — HEPARIN SOD (PORK) LOCK FLUSH 100 UNIT/ML IV SOLN
500.0000 [IU] | INTRAVENOUS | Status: AC | PRN
  Administered 2017-06-04: 500 [IU]
  Filled 2017-06-04: qty 5

## 2017-06-04 MED ORDER — HEPARIN SOD (PORK) LOCK FLUSH 100 UNIT/ML IV SOLN
500.0000 [IU] | Freq: Once | INTRAVENOUS | Status: DC
  Filled 2017-06-04: qty 5

## 2017-06-04 MED ORDER — SODIUM CHLORIDE 0.9% FLUSH
10.0000 mL | Freq: Once | INTRAVENOUS | Status: AC
  Administered 2017-06-04: 10 mL
  Filled 2017-06-04: qty 10

## 2017-06-04 MED ORDER — SODIUM CHLORIDE 0.9% FLUSH
10.0000 mL | INTRAVENOUS | Status: AC | PRN
  Administered 2017-06-04: 10 mL

## 2017-06-04 MED ORDER — SODIUM CHLORIDE 0.9 % IV SOLN
Freq: Once | INTRAVENOUS | Status: AC
  Administered 2017-06-04: 14:00:00 via INTRAVENOUS
  Filled 2017-06-04: qty 4

## 2017-06-04 MED ORDER — SODIUM CHLORIDE 0.9 % IV SOLN
INTRAVENOUS | Status: AC
  Administered 2017-06-04: 13:00:00 via INTRAVENOUS

## 2017-06-04 NOTE — Telephone Encounter (Signed)
Pt here for labs -reports very weak and fatigues, legs shaky and cannot stand alone, sob, unable to eat x 4 days, urine very dark per family report. No relief from antiemetics. appt given for today.

## 2017-06-04 NOTE — Progress Notes (Signed)
[place] [phone]   [phone]) [phone]  OFFICE PROGRESS NOTE  Lajean Manes, MD [address] [place] [address]  DIAGNOSIS: Stage IV (T2b, N3, M1 a) non-small cell lung cancer, squamous cell carcinoma presented with large right upper lobe lung mass in addition to bilateral mediastinal lymphadenopathy as well as left upper lobe lung nodule diagnosed in March 2018. PDL 1 expression is 5%.   PRIOR THERAPY: None  CURRENT THERAPY: Systemic chemotherapy with carboplatin for AUC of 5 and paclitaxel 175 MG/M2 every 3 weeks. First dose 03/24/2017. Status post 4 cycles.  INTERVAL HISTORY: Maria Washington 83 y.o. female returns to the clinic today for follow-up visit. The patient came for symptom management today complaining of increasing fatigue and weakness, lack of appetite and dehydration. She denied having any chest pain or shortness breath. She has no cough or hemoptysis. She lost few pounds since her last visit. She has occasional nausea with no vomiting. She denied having any fever or chills. She is here today for evaluation and management of her condition.  MEDICAL HISTORY: Past Medical History:  Diagnosis Date  . Aortic atherosclerosis ([place]) 02/10/2017  . Arthritis   . CAD (coronary artery disease), native coronary artery    3 vessel calcification noted on CT scan   . Cardiac pacemaker in situ 12/05/2011  . Chronic diastolic congestive heart failure ([place])   . COPD (chronic obstructive pulmonary disease) (HCC)    spot on lung  . Encounter for antineoplastic chemotherapy 03/04/2017  . Goals of care, counseling/discussion 03/17/2017  . History of breast cancer    Treated with lumpectomy, radiation, and chemo sees Dr. Ralene Ok   . Hypertensive heart disease without CHF   . Hypokalemia 04/18/2017  . Hypothyroid   . Mitral regurgitation   . Paroxysmal atrial fibrillation (HCC)    CHA2DS2VASC score 5  . Second degree heart block   .  Stage IV squamous cell carcinoma of right lung ([place]) 03/04/2017    ALLERGIES:  is allergic to clindamycin/lincomycin and penicillins.  MEDICATIONS:  Current Outpatient Prescriptions  Medication Sig Dispense Refill  . alendronate (FOSAMAX) 70 MG tablet Take 70 mg by mouth every 7 (seven) days. Monday    . apixaban (ELIQUIS) 5 MG TABS tablet Take 1 tablet (5 mg total) by mouth 2 (two) times daily. 60 tablet 3  . BIOTIN 5000 PO Take 5,000 mcg by mouth daily.     . calcium-vitamin D (OSCAL WITH D) 500-200 MG-UNIT per tablet Take 1 tablet by mouth 2 (two) times daily.      . cholecalciferol (VITAMIN D) 1000 units tablet Take 1 capsule by mouth daily.    Marland Kitchen diltiazem (CARDIZEM CD) 120 MG 24 hr capsule Take 120 mg by mouth daily.  3  . folic acid (FOLVITE) 1 MG tablet Take 1 mg by mouth daily.      . furosemide (LASIX) 40 MG tablet Take 40 mg by mouth daily.     Marland Kitchen levothyroxine (SYNTHROID, LEVOTHROID) 137 MCG tablet Take 137 mcg by mouth daily before breakfast.    . lidocaine-prilocaine (EMLA) cream Apply 1 application topically as needed. 30 g 0  . Omega-3 Fatty Acids (FISH OIL) 1200 MG CAPS Take 1,200 mg by mouth 2 (two) times daily.     . potassium chloride SA (K-DUR,KLOR-CON) 20 MEQ tablet Take 20-40 mEq by mouth See admin instructions. 2 tabs in the morning, and 1 tab in the evening    .  prochlorperazine (COMPAZINE) 10 MG tablet Take 1 tablet (10 mg total) by mouth every 6 (six) hours as needed for nausea or vomiting. 30 tablet 0  . vitamin B-12 (CYANOCOBALAMIN) 1000 MCG tablet Take 1,000 mcg by mouth daily.       No current facility-administered medications for this visit.     SURGICAL HISTORY:  Past Surgical History:  Procedure Laterality Date  . ABDOMINAL HYSTERECTOMY    . BLADDER SUSPENSION    . BREAST LUMPECTOMY     lumpectomy  . CARPAL TUNNEL RELEASE     left wrist  . CHOLECYSTECTOMY    . FINGER SURGERY    . IR FLUORO GUIDE PORT INSERTION RIGHT  03/12/2017  . IR US GUIDE VASC  ACCESS RIGHT  03/12/2017  . PERMANENT PACEMAKER INSERTION N/A 12/04/2011   Procedure: PERMANENT PACEMAKER INSERTION;  Surgeon: Evans Lance, MD;  Location[facility] C[facility] LAB;  Service: Cardiovascular;  Laterality: N/A;  . SHOULDER SURGERY    . TONSILLECTOMY AND ADENOIDECTOMY      REVIEW OF SYSTEMS:  Constitutional: positive for anorexia, fatigue and weight loss Eyes: negative Ears, nose, mouth, throat, and face: negative Respiratory: negative Cardiovascular: negative Gastrointestinal: positive for nausea Genitourinary:negative Integument/breast: negative Hematologic/lymphatic: negative Musculoskeletal:negative Neurological: negative Behavioral/Psych: negative Endocrine: negative Allergic/Immunologic: negative   PHYSICAL EXAMINATION: General appearance: alert, cooperative, fatigued and no distress Head: Normocephalic, without obvious abnormality, atraumatic Neck: no adenopathy, no JVD, supple, symmetrical, trachea midline and thyroid not enlarged, symmetric, no tenderness/mass/nodules Lymph nodes: Cervical, supraclavicular, and axillary nodes normal. Resp: clear to auscultation bilaterally Back: symmetric, no curvature. ROM normal. No CVA tenderness. Cardio: regular rate and rhythm, S1, S2 normal, no murmur, click, rub or gallop GI: soft, non-tender; bowel sounds normal; no masses,  no organomegaly Extremities: extremities normal, atraumatic, no cyanosis or edema Neurologic: Alert and oriented X 3, normal strength and tone. Normal symmetric reflexes. Normal coordination and gait  ECOG PERFORMANCE STATUS: 1 - Symptomatic but completely ambulatory  Blood pressure 93/70, pulse 80, temperature 98 F (36.7 C), resp. rate 18, height 5' (1.524 m), weight 144 lb 14.4 oz (65.7 kg), SpO2 100 %.  LABORATORY DATA: Lab Results  Component Value Date   WBC 3.8 (L) 06/04/2017   HGB 11.7 06/04/2017   HCT 34.8 06/04/2017   MCV 96.9 06/04/2017   PLT 128 (L) 06/04/2017      Chemistry        Component Value Date/Time   NA 138 06/04/2017 1003   K 4.2 06/04/2017 1003   CL 99 (L) 09/29/2015 0544   CO2 20 (L) 06/04/2017 1003   BUN 55.4 (H) 06/04/2017 1003   CREATININE 1.7 (H) 06/04/2017 1003      Component Value Date/Time   CALCIUM 10.7 (H) 06/04/2017 1003   ALKPHOS 71 06/04/2017 1003   AST 15 06/04/2017 1003   ALT 24 06/04/2017 1003   BILITOT 0.87 06/04/2017 1003       RADIOGRAPHIC STUDIES: Ct Chest W Contrast  Result Date: 05/26/2017 CLINICAL DATA:  Stage IVA squamous cell lung carcinoma diagnosed March 2018. Ongoing chemotherapy. Remote history of LEFT breast cancer. EXAM: CT CHEST, ABDOMEN, AND PELVIS WITH CONTRAST TECHNIQUE: Multidetector CT imaging of the chest, abdomen and pelvis was performed following the standard protocol during bolus administration of intravenous contrast. CONTRAST:  190mL ISOVUE-300 IOPAMIDOL (ISOVUE-300) INJECTION 61% COMPARISON:  PET-CT scan 02/26/2017 FINDINGS: CT CHEST FINDINGS Cardiovascular: Coronary artery calcification and aortic atherosclerotic calcification. No pericardial effusion. No central pulmonary embolism. Mediastinum/Nodes: LEFT axillary nodal dissection. Port in  the RIGHT chest wall. Pacemaker in LEFT chest wall. No supraclavicular adenopathy. Prevascular lymph nodes are decreased in size. For example 16 mm short axis lymph node compares to 27 mm. No new mediastinal adenopathy. No hilar adenopathy. Lungs/Pleura: Mass in the RIGHT upper lobe decrease in size and now central cavitation. Lesion measures 1.9 cm decreased from 5.1 cm (image 49, series 4. Nodule lesion in the LEFT upper lobe also decreased in size measuring 0.8 cm decreased from 2.3 cm. No new pulmonary nodules. Musculoskeletal: No aggressive osseous lesion. CT ABDOMEN AND PELVIS FINDINGS Hepatobiliary: No focal hepatic lesion. No biliary ductal dilatation. Gallbladder is normal. Common bile duct is normal. Pancreas: Pancreas is normal. No ductal dilatation. No pancreatic  inflammation. Spleen: Normal spleen Adrenals/urinary tract: Adrenal glands are normal. Benign cysts of the LEFT kidney. Bilateral renal cortical thinning. Ureters and bladder normal. Stomach/Bowel: Stomach, small bowel, appendix, and cecum are normal. The colon and rectosigmoid colon are normal. Vascular/Lymphatic: Abdominal aorta is normal caliber with atherosclerotic calcification. There is no retroperitoneal or periportal lymphadenopathy. No pelvic lymphadenopathy. Reproductive: Post hysterectomy. Bilateral fat filled inguinal hernias. Other: No free fluid. Musculoskeletal: No aggressive osseous lesion. IMPRESSION: Chest Impression: 1. Interval decrease in size of RIGHT upper lobe mass. Mass now with central cavitation. 2. Interval decrease in size of LEFT upper lobe pulmonary nodule. 3. Interval decrease in size of prevascular metastatic lymph nodes. 4. No evidence of disease progression in the thorax. Abdomen / Pelvis Impression: 1. No evidence of metastatic disease in the abdomen pelvis. 2.  Aortic Atherosclerosis (ICD10-I70.0). Electronically Signed   By: Suzy Bouchard M.D.   On: 05/26/2017 11:28   Ct Abdomen Pelvis W Contrast  Result Date: 05/26/2017 CLINICAL DATA:  Stage IVA squamous cell lung carcinoma diagnosed March 2018. Ongoing chemotherapy. Remote history of LEFT breast cancer. EXAM: CT CHEST, ABDOMEN, AND PELVIS WITH CONTRAST TECHNIQUE: Multidetector CT imaging of the chest, abdomen and pelvis was performed following the standard protocol during bolus administration of intravenous contrast. CONTRAST:  122mL ISOVUE-300 IOPAMIDOL (ISOVUE-300) INJECTION 61% COMPARISON:  PET-CT scan 02/26/2017 FINDINGS: CT CHEST FINDINGS Cardiovascular: Coronary artery calcification and aortic atherosclerotic calcification. No pericardial effusion. No central pulmonary embolism. Mediastinum/Nodes: LEFT axillary nodal dissection. Port in the RIGHT chest wall. Pacemaker in LEFT chest wall. No supraclavicular  adenopathy. Prevascular lymph nodes are decreased in size. For example 16 mm short axis lymph node compares to 27 mm. No new mediastinal adenopathy. No hilar adenopathy. Lungs/Pleura: Mass in the RIGHT upper lobe decrease in size and now central cavitation. Lesion measures 1.9 cm decreased from 5.1 cm (image 49, series 4. Nodule lesion in the LEFT upper lobe also decreased in size measuring 0.8 cm decreased from 2.3 cm. No new pulmonary nodules. Musculoskeletal: No aggressive osseous lesion. CT ABDOMEN AND PELVIS FINDINGS Hepatobiliary: No focal hepatic lesion. No biliary ductal dilatation. Gallbladder is normal. Common bile duct is normal. Pancreas: Pancreas is normal. No ductal dilatation. No pancreatic inflammation. Spleen: Normal spleen Adrenals/urinary tract: Adrenal glands are normal. Benign cysts of the LEFT kidney. Bilateral renal cortical thinning. Ureters and bladder normal. Stomach/Bowel: Stomach, small bowel, appendix, and cecum are normal. The colon and rectosigmoid colon are normal. Vascular/Lymphatic: Abdominal aorta is normal caliber with atherosclerotic calcification. There is no retroperitoneal or periportal lymphadenopathy. No pelvic lymphadenopathy. Reproductive: Post hysterectomy. Bilateral fat filled inguinal hernias. Other: No free fluid. Musculoskeletal: No aggressive osseous lesion. IMPRESSION: Chest Impression: 1. Interval decrease in size of RIGHT upper lobe mass. Mass now with central cavitation. 2. Interval decrease  in size of LEFT upper lobe pulmonary nodule. 3. Interval decrease in size of prevascular metastatic lymph nodes. 4. No evidence of disease progression in the thorax. Abdomen / Pelvis Impression: 1. No evidence of metastatic disease in the abdomen pelvis. 2.  Aortic Atherosclerosis (ICD10-I70.0). Electronically Signed   By: Suzy Bouchard M.D.   On: 05/26/2017 11:28    ASSESSMENT AND PLAN:  This is a very pleasant 83 years old white female with a stage IV non-small cell  lung cancer, adenocarcinoma with PDL 1 expression of 5%. She is currently undergoing treatment with systemic chemotherapy was carboplatin and paclitaxel is status post 4 cycles with significant improvement in her disease after cycle #3. She will receive cycle #4 last week. She presented today with significant fatigue and weakness, lack of appetite, poor by mouth intake and dehydration. I will arrange for the patient to receive IV hydration today. I will also arrange for the patient to receive IV hydration on daily basis for the next 2 days. I also encouraged her to increase her oral intake of fluid and frequent snacks. She will come back for follow-up visit in 2 weeks for evaluation before starting cycle #5. She was advised to call immediately if she has any concerning symptoms in the interval. The patient voices understanding of current disease status and treatment options and is in agreement with the current care plan. All questions were answered. The patient knows to call the clinic with any problems, questions or concerns. We can certainly see the patient much sooner if necessary. I spent 15 minutes counseling the patient face to face. The total time spent in the appointment was 25 minutes.  Disclaimer: This note was dictated with voice recognition software. Similar sounding words can inadvertently be transcribed and may not be corrected upon review.

## 2017-06-04 NOTE — Discharge Instructions (Signed)
Pt received 1000cc normal saline over 2 hours and a dose of Zofan today.

## 2017-06-04 NOTE — Progress Notes (Signed)
Diagnosis Association: Stage IV squamous cell carcinoma of right lung (HCC) (C34.91); Dehydration (E86.0)  Provider: Dr. Julien Nordmann  Procedure: Pt received 1000cc normal saline and a dose of Zofran.  Post procedure: Pt tolerated well. She was alert, oriented and ambulatory to wheelchair at discharge. Family member with patient.

## 2017-06-04 NOTE — Telephone Encounter (Signed)
Scheduled appt per 6/27 los - patient is aware of appt date and time.

## 2017-06-05 ENCOUNTER — Ambulatory Visit (HOSPITAL_BASED_OUTPATIENT_CLINIC_OR_DEPARTMENT_OTHER): Payer: Medicare Other

## 2017-06-05 VITALS — BP 113/50 | HR 68 | Temp 97.7°F | Resp 18 | Ht 61.0 in | Wt 146.4 lb

## 2017-06-05 DIAGNOSIS — Z95828 Presence of other vascular implants and grafts: Secondary | ICD-10-CM

## 2017-06-05 DIAGNOSIS — E860 Dehydration: Secondary | ICD-10-CM | POA: Diagnosis not present

## 2017-06-05 DIAGNOSIS — C3491 Malignant neoplasm of unspecified part of right bronchus or lung: Secondary | ICD-10-CM

## 2017-06-05 DIAGNOSIS — C3411 Malignant neoplasm of upper lobe, right bronchus or lung: Secondary | ICD-10-CM | POA: Diagnosis not present

## 2017-06-05 MED ORDER — SODIUM CHLORIDE 0.9% FLUSH
10.0000 mL | Freq: Once | INTRAVENOUS | Status: AC
  Administered 2017-06-05: 10 mL
  Filled 2017-06-05: qty 10

## 2017-06-05 MED ORDER — SODIUM CHLORIDE 0.9 % IV SOLN
INTRAVENOUS | Status: DC
  Administered 2017-06-05: 14:00:00 via INTRAVENOUS

## 2017-06-05 MED ORDER — HEPARIN SOD (PORK) LOCK FLUSH 100 UNIT/ML IV SOLN
250.0000 [IU] | Freq: Once | INTRAVENOUS | Status: AC
  Administered 2017-06-05: 500 [IU]
  Filled 2017-06-05: qty 5

## 2017-06-05 NOTE — Patient Instructions (Signed)
Dehydration, Adult Dehydration is a condition in which there is not enough fluid or water in the body. This happens when you lose more fluids than you take in. Important organs, such as the kidneys, brain, and heart, cannot function without a proper amount of fluids. Any loss of fluids from the body can lead to dehydration. Dehydration can range from mild to severe. This condition should be treated right away to prevent it from becoming severe. What are the causes? This condition may be caused by:  Vomiting.  Diarrhea.  Excessive sweating, such as from heat exposure or exercise.  Not drinking enough fluid, especially: ? When ill. ? While doing activity that requires a lot of energy.  Excessive urination.  Fever.  Infection.  Certain medicines, such as medicines that cause the body to lose excess fluid (diuretics).  Inability to access safe drinking water.  Reduced physical ability to get adequate water and food.  What increases the risk? This condition is more likely to develop in people:  Who have a poorly controlled long-term (chronic) illness, such as diabetes, heart disease, or kidney disease.  Who are age 65 or older.  Who are disabled.  Who live in a place with high altitude.  Who play endurance sports.  What are the signs or symptoms? Symptoms of mild dehydration may include:  Thirst.  Dry lips.  Slightly dry mouth.  Dry, warm skin.  Dizziness. Symptoms of moderate dehydration may include:  Very dry mouth.  Muscle cramps.  Dark urine. Urine may be the color of tea.  Decreased urine production.  Decreased tear production.  Heartbeat that is irregular or faster than normal (palpitations).  Headache.  Light-headedness, especially when you stand up from a sitting position.  Fainting (syncope). Symptoms of severe dehydration may include:  Changes in skin, such as: ? Cold and clammy skin. ? Blotchy (mottled) or pale skin. ? Skin that does  not quickly return to normal after being lightly pinched and released (poor skin turgor).  Changes in body fluids, such as: ? Extreme thirst. ? No tear production. ? Inability to sweat when body temperature is high, such as in hot weather. ? Very little urine production.  Changes in vital signs, such as: ? Weak pulse. ? Pulse that is more than 100 beats a minute when sitting still. ? Rapid breathing. ? Low blood pressure.  Other changes, such as: ? Sunken eyes. ? Cold hands and feet. ? Confusion. ? Lack of energy (lethargy). ? Difficulty waking up from sleep. ? Short-term weight loss. ? Unconsciousness. How is this diagnosed? This condition is diagnosed based on your symptoms and a physical exam. Blood and urine tests may be done to help confirm the diagnosis. How is this treated? Treatment for this condition depends on the severity. Mild or moderate dehydration can often be treated at home. Treatment should be started right away. Do not wait until dehydration becomes severe. Severe dehydration is an emergency and it needs to be treated in a hospital. Treatment for mild dehydration may include:  Drinking more fluids.  Replacing salts and minerals in your blood (electrolytes) that you may have lost. Treatment for moderate dehydration may include:  Drinking an oral rehydration solution (ORS). This is a drink that helps you replace fluids and electrolytes (rehydrate). It can be found at pharmacies and retail stores. Treatment for severe dehydration may include:  Receiving fluids through an IV tube.  Receiving an electrolyte solution through a feeding tube that is passed through your nose   and into your stomach (nasogastric tube, or NG tube).  Correcting any abnormalities in electrolytes.  Treating the underlying cause of dehydration. Follow these instructions at home:  If directed by your health care provider, drink an ORS: ? Make an ORS by following instructions on the  package. ? Start by drinking small amounts, about  cup (120 mL) every 5-10 minutes. ? Slowly increase how much you drink until you have taken the amount recommended by your health care provider.  Drink enough clear fluid to keep your urine clear or pale yellow. If you were told to drink an ORS, finish the ORS first, then start slowly drinking other clear fluids. Drink fluids such as: ? Water. Do not drink only water. Doing that can lead to having too little salt (sodium) in the body (hyponatremia). ? Ice chips. ? Fruit juice that you have added water to (diluted fruit juice). ? Low-calorie sports drinks.  Avoid: ? Alcohol. ? Drinks that contain a lot of sugar. These include high-calorie sports drinks, fruit juice that is not diluted, and soda. ? Caffeine. ? Foods that are greasy or contain a lot of fat or sugar.  Take over-the-counter and prescription medicines only as told by your health care provider.  Do not take sodium tablets. This can lead to having too much sodium in the body (hypernatremia).  Eat foods that contain a healthy balance of electrolytes, such as bananas, oranges, potatoes, tomatoes, and spinach.  Keep all follow-up visits as told by your health care provider. This is important. Contact a health care provider if:  You have abdominal pain that: ? Gets worse. ? Stays in one area (localizes).  You have a rash.  You have a stiff neck.  You are more irritable than usual.  You are sleepier or more difficult to wake up than usual.  You feel weak or dizzy.  You feel very thirsty.  You have urinated only a small amount of very dark urine over 6-8 hours. Get help right away if:  You have symptoms of severe dehydration.  You cannot drink fluids without vomiting.  Your symptoms get worse with treatment.  You have a fever.  You have a severe headache.  You have vomiting or diarrhea that: ? Gets worse. ? Does not go away.  You have blood or green matter  (bile) in your vomit.  You have blood in your stool. This may cause stool to look black and tarry.  You have not urinated in 6-8 hours.  You faint.  Your heart rate while sitting still is over 100 beats a minute.  You have trouble breathing. This information is not intended to replace advice given to you by your health care provider. Make sure you discuss any questions you have with your health care provider. Document Released: 11/25/2005 Document Revised: 06/21/2016 Document Reviewed: 01/19/2016 Elsevier Interactive Patient Education  2018 Elsevier Inc.  

## 2017-06-05 NOTE — Progress Notes (Signed)
Pt received IV fluids yesterday. She states she feels a bit better today. Denies nausea/fever/chills. States she still has diarrhea(some of this is baseline for her as she states she has IBS)  She states the diarrhea is small amounts, watery about every 2-3 hours. Though she does sleep through most of the night. Pt states she picked up Imodium from the drugstore today but has not started it. Reviewed instructions for this medication with her. She voiced understanding. She states she ate pretty well at dinner last night-chicken, a biscuit with gravy . Drinking fluids well. Having ice water and ice cream during IV fluid infusion. Daughter Blanch Media is with her today.

## 2017-06-06 ENCOUNTER — Ambulatory Visit (HOSPITAL_BASED_OUTPATIENT_CLINIC_OR_DEPARTMENT_OTHER): Payer: Medicare Other

## 2017-06-06 VITALS — BP 115/46 | HR 74 | Temp 97.8°F | Resp 18 | Ht 61.0 in | Wt 149.6 lb

## 2017-06-06 DIAGNOSIS — C3411 Malignant neoplasm of upper lobe, right bronchus or lung: Secondary | ICD-10-CM

## 2017-06-06 DIAGNOSIS — E860 Dehydration: Secondary | ICD-10-CM | POA: Diagnosis not present

## 2017-06-06 DIAGNOSIS — Z95828 Presence of other vascular implants and grafts: Secondary | ICD-10-CM

## 2017-06-06 DIAGNOSIS — C3491 Malignant neoplasm of unspecified part of right bronchus or lung: Secondary | ICD-10-CM

## 2017-06-06 MED ORDER — SODIUM CHLORIDE 0.9 % IV SOLN
INTRAVENOUS | Status: DC
  Administered 2017-06-06: 14:00:00 via INTRAVENOUS

## 2017-06-06 MED ORDER — HEPARIN SOD (PORK) LOCK FLUSH 100 UNIT/ML IV SOLN
250.0000 [IU] | Freq: Once | INTRAVENOUS | Status: AC
  Administered 2017-06-06: 500 [IU]
  Filled 2017-06-06: qty 5

## 2017-06-06 MED ORDER — SODIUM CHLORIDE 0.9% FLUSH
10.0000 mL | Freq: Once | INTRAVENOUS | Status: AC
  Administered 2017-06-06: 10 mL
  Filled 2017-06-06: qty 10

## 2017-06-06 NOTE — Progress Notes (Signed)
Pt up to the bathroom -had 1 episode of diarrhea. Pt took 1 imodium (home meds) when she returned to her room. Denies any discomfort.

## 2017-06-06 NOTE — Patient Instructions (Signed)
Dehydration, Adult Dehydration is a condition in which there is not enough fluid or water in the body. This happens when you lose more fluids than you take in. Important organs, such as the kidneys, brain, and heart, cannot function without a proper amount of fluids. Any loss of fluids from the body can lead to dehydration. Dehydration can range from mild to severe. This condition should be treated right away to prevent it from becoming severe. What are the causes? This condition may be caused by:  Vomiting.  Diarrhea.  Excessive sweating, such as from heat exposure or exercise.  Not drinking enough fluid, especially: ? When ill. ? While doing activity that requires a lot of energy.  Excessive urination.  Fever.  Infection.  Certain medicines, such as medicines that cause the body to lose excess fluid (diuretics).  Inability to access safe drinking water.  Reduced physical ability to get adequate water and food.  What increases the risk? This condition is more likely to develop in people:  Who have a poorly controlled long-term (chronic) illness, such as diabetes, heart disease, or kidney disease.  Who are age 65 or older.  Who are disabled.  Who live in a place with high altitude.  Who play endurance sports.  What are the signs or symptoms? Symptoms of mild dehydration may include:  Thirst.  Dry lips.  Slightly dry mouth.  Dry, warm skin.  Dizziness. Symptoms of moderate dehydration may include:  Very dry mouth.  Muscle cramps.  Dark urine. Urine may be the color of tea.  Decreased urine production.  Decreased tear production.  Heartbeat that is irregular or faster than normal (palpitations).  Headache.  Light-headedness, especially when you stand up from a sitting position.  Fainting (syncope). Symptoms of severe dehydration may include:  Changes in skin, such as: ? Cold and clammy skin. ? Blotchy (mottled) or pale skin. ? Skin that does  not quickly return to normal after being lightly pinched and released (poor skin turgor).  Changes in body fluids, such as: ? Extreme thirst. ? No tear production. ? Inability to sweat when body temperature is high, such as in hot weather. ? Very little urine production.  Changes in vital signs, such as: ? Weak pulse. ? Pulse that is more than 100 beats a minute when sitting still. ? Rapid breathing. ? Low blood pressure.  Other changes, such as: ? Sunken eyes. ? Cold hands and feet. ? Confusion. ? Lack of energy (lethargy). ? Difficulty waking up from sleep. ? Short-term weight loss. ? Unconsciousness. How is this diagnosed? This condition is diagnosed based on your symptoms and a physical exam. Blood and urine tests may be done to help confirm the diagnosis. How is this treated? Treatment for this condition depends on the severity. Mild or moderate dehydration can often be treated at home. Treatment should be started right away. Do not wait until dehydration becomes severe. Severe dehydration is an emergency and it needs to be treated in a hospital. Treatment for mild dehydration may include:  Drinking more fluids.  Replacing salts and minerals in your blood (electrolytes) that you may have lost. Treatment for moderate dehydration may include:  Drinking an oral rehydration solution (ORS). This is a drink that helps you replace fluids and electrolytes (rehydrate). It can be found at pharmacies and retail stores. Treatment for severe dehydration may include:  Receiving fluids through an IV tube.  Receiving an electrolyte solution through a feeding tube that is passed through your nose   and into your stomach (nasogastric tube, or NG tube).  Correcting any abnormalities in electrolytes.  Treating the underlying cause of dehydration. Follow these instructions at home:  If directed by your health care provider, drink an ORS: ? Make an ORS by following instructions on the  package. ? Start by drinking small amounts, about  cup (120 mL) every 5-10 minutes. ? Slowly increase how much you drink until you have taken the amount recommended by your health care provider.  Drink enough clear fluid to keep your urine clear or pale yellow. If you were told to drink an ORS, finish the ORS first, then start slowly drinking other clear fluids. Drink fluids such as: ? Water. Do not drink only water. Doing that can lead to having too little salt (sodium) in the body (hyponatremia). ? Ice chips. ? Fruit juice that you have added water to (diluted fruit juice). ? Low-calorie sports drinks.  Avoid: ? Alcohol. ? Drinks that contain a lot of sugar. These include high-calorie sports drinks, fruit juice that is not diluted, and soda. ? Caffeine. ? Foods that are greasy or contain a lot of fat or sugar.  Take over-the-counter and prescription medicines only as told by your health care provider.  Do not take sodium tablets. This can lead to having too much sodium in the body (hypernatremia).  Eat foods that contain a healthy balance of electrolytes, such as bananas, oranges, potatoes, tomatoes, and spinach.  Keep all follow-up visits as told by your health care provider. This is important. Contact a health care provider if:  You have abdominal pain that: ? Gets worse. ? Stays in one area (localizes).  You have a rash.  You have a stiff neck.  You are more irritable than usual.  You are sleepier or more difficult to wake up than usual.  You feel weak or dizzy.  You feel very thirsty.  You have urinated only a small amount of very dark urine over 6-8 hours. Get help right away if:  You have symptoms of severe dehydration.  You cannot drink fluids without vomiting.  Your symptoms get worse with treatment.  You have a fever.  You have a severe headache.  You have vomiting or diarrhea that: ? Gets worse. ? Does not go away.  You have blood or green matter  (bile) in your vomit.  You have blood in your stool. This may cause stool to look black and tarry.  You have not urinated in 6-8 hours.  You faint.  Your heart rate while sitting still is over 100 beats a minute.  You have trouble breathing. This information is not intended to replace advice given to you by your health care provider. Make sure you discuss any questions you have with your health care provider. Document Released: 11/25/2005 Document Revised: 06/21/2016 Document Reviewed: 01/19/2016 Elsevier Interactive Patient Education  2018 Elsevier Inc.  

## 2017-06-10 ENCOUNTER — Telehealth: Payer: Self-pay | Admitting: *Deleted

## 2017-06-10 NOTE — Telephone Encounter (Signed)
Returned call to pt at home and cell # regarding neuropathy concern. Unable to reach pt. LMOVM

## 2017-06-12 ENCOUNTER — Ambulatory Visit (HOSPITAL_BASED_OUTPATIENT_CLINIC_OR_DEPARTMENT_OTHER): Payer: Medicare Other

## 2017-06-12 ENCOUNTER — Other Ambulatory Visit (HOSPITAL_BASED_OUTPATIENT_CLINIC_OR_DEPARTMENT_OTHER): Payer: Medicare Other

## 2017-06-12 DIAGNOSIS — C3411 Malignant neoplasm of upper lobe, right bronchus or lung: Secondary | ICD-10-CM

## 2017-06-12 DIAGNOSIS — Z452 Encounter for adjustment and management of vascular access device: Secondary | ICD-10-CM

## 2017-06-12 DIAGNOSIS — Z95828 Presence of other vascular implants and grafts: Secondary | ICD-10-CM

## 2017-06-12 DIAGNOSIS — C3491 Malignant neoplasm of unspecified part of right bronchus or lung: Secondary | ICD-10-CM

## 2017-06-12 LAB — CBC WITH DIFFERENTIAL/PLATELET
BASO%: 0.9 % (ref 0.0–2.0)
Basophils Absolute: 0 10*3/uL (ref 0.0–0.1)
EOS%: 0.4 % (ref 0.0–7.0)
Eosinophils Absolute: 0 10*3/uL (ref 0.0–0.5)
HCT: 28.8 % — ABNORMAL LOW (ref 34.8–46.6)
HEMOGLOBIN: 9.7 g/dL — AB (ref 11.6–15.9)
LYMPH%: 43 % (ref 14.0–49.7)
MCH: 33.4 pg (ref 25.1–34.0)
MCHC: 33.7 g/dL (ref 31.5–36.0)
MCV: 99.3 fL (ref 79.5–101.0)
MONO#: 0.5 10*3/uL (ref 0.1–0.9)
MONO%: 21.1 % — AB (ref 0.0–14.0)
NEUT%: 34.6 % — ABNORMAL LOW (ref 38.4–76.8)
NEUTROS ABS: 0.8 10*3/uL — AB (ref 1.5–6.5)
Platelets: 152 10*3/uL (ref 145–400)
RBC: 2.9 10*6/uL — AB (ref 3.70–5.45)
RDW: 21.1 % — AB (ref 11.2–14.5)
WBC: 2.3 10*3/uL — AB (ref 3.9–10.3)
lymph#: 1 10*3/uL (ref 0.9–3.3)

## 2017-06-12 LAB — COMPREHENSIVE METABOLIC PANEL
ALBUMIN: 3.3 g/dL — AB (ref 3.5–5.0)
ALK PHOS: 66 U/L (ref 40–150)
ALT: 20 U/L (ref 0–55)
AST: 17 U/L (ref 5–34)
Anion Gap: 12 mEq/L — ABNORMAL HIGH (ref 3–11)
BILIRUBIN TOTAL: 0.34 mg/dL (ref 0.20–1.20)
BUN: 11.3 mg/dL (ref 7.0–26.0)
CO2: 22 meq/L (ref 22–29)
Calcium: 8 mg/dL — ABNORMAL LOW (ref 8.4–10.4)
Chloride: 110 mEq/L — ABNORMAL HIGH (ref 98–109)
Creatinine: 0.8 mg/dL (ref 0.6–1.1)
EGFR: 68 mL/min/{1.73_m2} — ABNORMAL LOW (ref >90)
GLUCOSE: 133 mg/dL (ref 70–140)
Potassium: 3 mEq/L — CL (ref 3.5–5.1)
SODIUM: 144 meq/L (ref 136–145)
TOTAL PROTEIN: 6.6 g/dL (ref 6.4–8.3)

## 2017-06-12 MED ORDER — HEPARIN SOD (PORK) LOCK FLUSH 100 UNIT/ML IV SOLN
500.0000 [IU] | Freq: Once | INTRAVENOUS | Status: AC
  Administered 2017-06-12: 500 [IU]
  Filled 2017-06-12: qty 5

## 2017-06-12 MED ORDER — SODIUM CHLORIDE 0.9% FLUSH
10.0000 mL | Freq: Once | INTRAVENOUS | Status: AC
  Administered 2017-06-12: 10 mL
  Filled 2017-06-12: qty 10

## 2017-06-12 NOTE — Patient Instructions (Signed)

## 2017-06-13 ENCOUNTER — Telehealth: Payer: Self-pay | Admitting: Emergency Medicine

## 2017-06-13 NOTE — Telephone Encounter (Signed)
Spoke with patient; patient states she is taking 2 potassium tablets twice a day; advised her to continue taking them. Patient denies any diarrhea at this time. Patient is complaining about neuropathy in her hands and feet. States her hands feel very cold. Patient denies dropping objects but states she does have difficulties zipping up clothes and buttoning buttons.   Advised patient to discuss these concerns with Dr Julien Nordmann in the office on 7/11 and to call this office if symptoms worsen.

## 2017-06-18 ENCOUNTER — Encounter: Payer: Medicare Other | Admitting: Nutrition

## 2017-06-18 ENCOUNTER — Ambulatory Visit (HOSPITAL_BASED_OUTPATIENT_CLINIC_OR_DEPARTMENT_OTHER): Payer: Medicare Other | Admitting: Internal Medicine

## 2017-06-18 ENCOUNTER — Ambulatory Visit (HOSPITAL_BASED_OUTPATIENT_CLINIC_OR_DEPARTMENT_OTHER): Payer: Medicare Other

## 2017-06-18 ENCOUNTER — Ambulatory Visit: Payer: Medicare Other

## 2017-06-18 ENCOUNTER — Other Ambulatory Visit (HOSPITAL_BASED_OUTPATIENT_CLINIC_OR_DEPARTMENT_OTHER): Payer: Medicare Other

## 2017-06-18 ENCOUNTER — Telehealth: Payer: Self-pay | Admitting: Internal Medicine

## 2017-06-18 ENCOUNTER — Encounter: Payer: Self-pay | Admitting: Internal Medicine

## 2017-06-18 VITALS — BP 142/57 | HR 66 | Temp 98.5°F | Resp 17 | Ht 61.0 in | Wt 155.9 lb

## 2017-06-18 DIAGNOSIS — Z95828 Presence of other vascular implants and grafts: Secondary | ICD-10-CM

## 2017-06-18 DIAGNOSIS — C3491 Malignant neoplasm of unspecified part of right bronchus or lung: Secondary | ICD-10-CM

## 2017-06-18 DIAGNOSIS — G620 Drug-induced polyneuropathy: Secondary | ICD-10-CM

## 2017-06-18 DIAGNOSIS — I10 Essential (primary) hypertension: Secondary | ICD-10-CM

## 2017-06-18 DIAGNOSIS — G629 Polyneuropathy, unspecified: Secondary | ICD-10-CM | POA: Insufficient documentation

## 2017-06-18 DIAGNOSIS — Z5111 Encounter for antineoplastic chemotherapy: Secondary | ICD-10-CM

## 2017-06-18 DIAGNOSIS — C3411 Malignant neoplasm of upper lobe, right bronchus or lung: Secondary | ICD-10-CM | POA: Diagnosis not present

## 2017-06-18 DIAGNOSIS — J449 Chronic obstructive pulmonary disease, unspecified: Secondary | ICD-10-CM

## 2017-06-18 LAB — CBC WITH DIFFERENTIAL/PLATELET
BASO%: 0.4 % (ref 0.0–2.0)
Basophils Absolute: 0 10*3/uL (ref 0.0–0.1)
EOS%: 0.5 % (ref 0.0–7.0)
Eosinophils Absolute: 0 10*3/uL (ref 0.0–0.5)
HEMATOCRIT: 28.6 % — AB (ref 34.8–46.6)
HGB: 9.6 g/dL — ABNORMAL LOW (ref 11.6–15.9)
LYMPH#: 1 10*3/uL (ref 0.9–3.3)
LYMPH%: 26.3 % (ref 14.0–49.7)
MCH: 34.2 pg — ABNORMAL HIGH (ref 25.1–34.0)
MCHC: 33.7 g/dL (ref 31.5–36.0)
MCV: 101.4 fL — ABNORMAL HIGH (ref 79.5–101.0)
MONO#: 0.6 10*3/uL (ref 0.1–0.9)
MONO%: 14.8 % — AB (ref 0.0–14.0)
NEUT#: 2.2 10*3/uL (ref 1.5–6.5)
NEUT%: 58 % (ref 38.4–76.8)
Platelets: 139 10*3/uL — ABNORMAL LOW (ref 145–400)
RBC: 2.82 10*6/uL — AB (ref 3.70–5.45)
RDW: 21.1 % — ABNORMAL HIGH (ref 11.2–14.5)
WBC: 3.9 10*3/uL (ref 3.9–10.3)

## 2017-06-18 LAB — COMPREHENSIVE METABOLIC PANEL
ALT: 18 U/L (ref 0–55)
AST: 12 U/L (ref 5–34)
Albumin: 3.2 g/dL — ABNORMAL LOW (ref 3.5–5.0)
Alkaline Phosphatase: 58 U/L (ref 40–150)
Anion Gap: 12 mEq/L — ABNORMAL HIGH (ref 3–11)
BUN: 11.4 mg/dL (ref 7.0–26.0)
CHLORIDE: 107 meq/L (ref 98–109)
CO2: 23 meq/L (ref 22–29)
CREATININE: 0.8 mg/dL (ref 0.6–1.1)
Calcium: 8.7 mg/dL (ref 8.4–10.4)
EGFR: 67 mL/min/{1.73_m2} — ABNORMAL LOW (ref >90)
GLUCOSE: 165 mg/dL — AB (ref 70–140)
Potassium: 3.4 mEq/L — ABNORMAL LOW (ref 3.5–5.1)
SODIUM: 141 meq/L (ref 136–145)
Total Bilirubin: 0.32 mg/dL (ref 0.20–1.20)
Total Protein: 6.4 g/dL (ref 6.4–8.3)

## 2017-06-18 MED ORDER — SODIUM CHLORIDE 0.9% FLUSH
10.0000 mL | INTRAVENOUS | Status: DC | PRN
  Administered 2017-06-18: 10 mL
  Filled 2017-06-18: qty 10

## 2017-06-18 MED ORDER — SODIUM CHLORIDE 0.9 % IV SOLN
Freq: Once | INTRAVENOUS | Status: AC
  Administered 2017-06-18: 11:00:00 via INTRAVENOUS

## 2017-06-18 MED ORDER — SODIUM CHLORIDE 0.9 % IV SOLN
286.4000 mg | Freq: Once | INTRAVENOUS | Status: AC
  Administered 2017-06-18: 290 mg via INTRAVENOUS
  Filled 2017-06-18: qty 29

## 2017-06-18 MED ORDER — SODIUM CHLORIDE 0.9 % IV SOLN
20.0000 mg | Freq: Once | INTRAVENOUS | Status: AC
  Administered 2017-06-18: 20 mg via INTRAVENOUS
  Filled 2017-06-18: qty 2

## 2017-06-18 MED ORDER — FAMOTIDINE IN NACL 20-0.9 MG/50ML-% IV SOLN
INTRAVENOUS | Status: AC
  Filled 2017-06-18: qty 50

## 2017-06-18 MED ORDER — DIPHENHYDRAMINE HCL 50 MG/ML IJ SOLN
50.0000 mg | Freq: Once | INTRAMUSCULAR | Status: AC
  Administered 2017-06-18: 50 mg via INTRAVENOUS

## 2017-06-18 MED ORDER — GABAPENTIN 100 MG PO CAPS: 100.0000 mg | ORAL_CAPSULE | Freq: Three times a day (TID) | ORAL | 2 refills | Status: DC

## 2017-06-18 MED ORDER — HEPARIN SOD (PORK) LOCK FLUSH 100 UNIT/ML IV SOLN
500.0000 [IU] | Freq: Once | INTRAVENOUS | Status: AC | PRN
  Administered 2017-06-18: 500 [IU]
  Filled 2017-06-18: qty 5

## 2017-06-18 MED ORDER — PALONOSETRON HCL INJECTION 0.25 MG/5ML
INTRAVENOUS | Status: AC
  Filled 2017-06-18: qty 5

## 2017-06-18 MED ORDER — FAMOTIDINE IN NACL 20-0.9 MG/50ML-% IV SOLN
20.0000 mg | Freq: Once | INTRAVENOUS | Status: AC
  Administered 2017-06-18: 20 mg via INTRAVENOUS

## 2017-06-18 MED ORDER — DIPHENHYDRAMINE HCL 50 MG/ML IJ SOLN
INTRAMUSCULAR | Status: AC
  Filled 2017-06-18: qty 1

## 2017-06-18 MED ORDER — SODIUM CHLORIDE 0.9% FLUSH
10.0000 mL | Freq: Once | INTRAVENOUS | Status: AC
  Administered 2017-06-18: 10 mL
  Filled 2017-06-18: qty 10

## 2017-06-18 MED ORDER — PALONOSETRON HCL INJECTION 0.25 MG/5ML
0.2500 mg | Freq: Once | INTRAVENOUS | Status: AC
  Administered 2017-06-18: 0.25 mg via INTRAVENOUS

## 2017-06-18 MED ORDER — DEXTROSE 5 % IV SOLN
150.0000 mg/m2 | Freq: Once | INTRAVENOUS | Status: AC
  Administered 2017-06-18: 264 mg via INTRAVENOUS
  Filled 2017-06-18: qty 44

## 2017-06-18 NOTE — Telephone Encounter (Signed)
Scheduled appt per 7/11 los - Patient is aware and will pick up new next visit. [facility] to contact patient with appt for ct.

## 2017-06-18 NOTE — Progress Notes (Signed)
[facility] [phone]   [phone]) [phone]  OFFICE PROGRESS NOTE  Lajean Manes, MD [address] [place] [address]  DIAGNOSIS: Stage IV (T2b, N3, M1 a) non-small cell lung cancer, squamous cell carcinoma presented with large right upper lobe lung mass in addition to bilateral mediastinal lymphadenopathy as well as left upper lobe lung nodule diagnosed in March 2018. PDL 1 expression is 5%.   PRIOR THERAPY: None  CURRENT THERAPY: Systemic chemotherapy with carboplatin for AUC of 5 and paclitaxel 175 MG/M2 every 3 weeks. First dose 03/24/2017. Status post 4 cycles.  INTERVAL HISTORY: Maria Washington 83 y.o. female returns to the clinic today for follow-up visit accompanied by her 3 daughters. The patient is feeling fine today except for the generalized fatigue and weakness as well as peripheral neuropathy mainly on the fingers. She has rough time with the last cycle of the chemotherapy with increasing fatigue and weakness. She denied having any nausea or vomiting. She denied having any fever or chills. She has no weight loss or night sweats. She has no chest pain, shortness breath, cough or hemoptysis. She is here today for evaluation before starting cycle #5.   MEDICAL HISTORY: Past Medical History:  Diagnosis Date  . Aortic atherosclerosis ([facility]) 02/10/2017  . Arthritis   . CAD (coronary artery disease), native coronary artery    3 vessel calcification noted on CT scan   . Cardiac pacemaker in situ 12/05/2011  . Chronic diastolic congestive heart failure ([facility])   . COPD (chronic obstructive pulmonary disease) ([facility])    spot on lung  . Dehydration 06/04/2017  . Encounter for antineoplastic chemotherapy 03/04/2017  . Goals of care, counseling/discussion 03/17/2017  . History of breast cancer    Treated with lumpectomy, radiation, and chemo sees Dr. Ralene Ok   . Hypertensive heart disease without CHF   . Hypokalemia 04/18/2017  .  Hypothyroid   . Mitral regurgitation   . Paroxysmal atrial fibrillation ([facility])    CHA2DS2VASC score 5  . Second degree heart block   . Stage IV squamous cell carcinoma of right lung ([facility]) 03/04/2017    ALLERGIES:  is allergic to clindamycin/lincomycin and penicillins.  MEDICATIONS:  Current Outpatient Prescriptions  Medication Sig Dispense Refill  . alendronate (FOSAMAX) 70 MG tablet Take 70 mg by mouth every 7 (seven) days. Monday    . apixaban (ELIQUIS) 5 MG TABS tablet Take 1 tablet (5 mg total) by mouth 2 (two) times daily. 60 tablet 3  . BIOTIN 5000 PO Take 5,000 mcg by mouth daily.     . calcium-vitamin D (OSCAL WITH D) 500-200 MG-UNIT per tablet Take 1 tablet by mouth 2 (two) times daily.      . cholecalciferol (VITAMIN D) 1000 units tablet Take 1 capsule by mouth daily.    Marland Kitchen diltiazem (CARDIZEM CD) 120 MG 24 hr capsule Take 120 mg by mouth daily.  3  . folic acid (FOLVITE) 1 MG tablet Take 1 mg by mouth daily.      . furosemide (LASIX) 40 MG tablet Take 40 mg by mouth daily.     Marland Kitchen levothyroxine (SYNTHROID, LEVOTHROID) 137 MCG tablet Take 137 mcg by mouth daily before breakfast.    . lidocaine-prilocaine (EMLA) cream Apply 1 application topically as needed. 30 g 0  . loperamide (IMODIUM) 2 MG capsule Take 2 mg by mouth as needed for diarrhea or loose stools.    . Omega-3 Fatty Acids (FISH OIL) 1200  MG CAPS Take 1,200 mg by mouth 2 (two) times daily.     . potassium chloride SA (K-DUR,KLOR-CON) 20 MEQ tablet Take 20-40 mEq by mouth See admin instructions. 2 tabs in the morning, and 1 tab in the evening    . prochlorperazine (COMPAZINE) 10 MG tablet Take 1 tablet (10 mg total) by mouth every 6 (six) hours as needed for nausea or vomiting. 30 tablet 0  . vitamin B-12 (CYANOCOBALAMIN) 1000 MCG tablet Take 1,000 mcg by mouth daily.       No current facility-administered medications for this visit.     SURGICAL HISTORY:  Past Surgical History:  Procedure Laterality Date  . ABDOMINAL  HYSTERECTOMY    . BLADDER SUSPENSION    . BREAST LUMPECTOMY     lumpectomy  . CARPAL TUNNEL RELEASE     left wrist  . CHOLECYSTECTOMY    . FINGER SURGERY    . IR FLUORO GUIDE PORT INSERTION RIGHT  03/12/2017  . IR US GUIDE VASC ACCESS RIGHT  03/12/2017  . PERMANENT PACEMAKER INSERTION N/A 12/04/2011   Procedure: PERMANENT PACEMAKER INSERTION;  Surgeon: Evans Lance, MD;  Location: [facility] CATH LAB;  Service: Cardiovascular;  Laterality: N/A;  . SHOULDER SURGERY    . TONSILLECTOMY AND ADENOIDECTOMY      REVIEW OF SYSTEMS:  Constitutional: positive for anorexia and fatigue Eyes: negative Ears, nose, mouth, throat, and face: negative Respiratory: negative Cardiovascular: negative Gastrointestinal: negative Genitourinary:negative Integument/breast: negative Hematologic/lymphatic: negative Musculoskeletal:positive for muscle weakness Neurological: negative Behavioral/Psych: negative Endocrine: negative Allergic/Immunologic: negative   PHYSICAL EXAMINATION: General appearance: alert, cooperative, fatigued and no distress Head: Normocephalic, without obvious abnormality, atraumatic Neck: no adenopathy, no JVD, supple, symmetrical, trachea midline and thyroid not enlarged, symmetric, no tenderness/mass/nodules Lymph nodes: Cervical, supraclavicular, and axillary nodes normal. Resp: clear to auscultation bilaterally Back: symmetric, no curvature. ROM normal. No CVA tenderness. Cardio: regular rate and rhythm, S1, S2 normal, no murmur, click, rub or gallop GI: soft, non-tender; bowel sounds normal; no masses,  no organomegaly Extremities: extremities normal, atraumatic, no cyanosis or edema Neurologic: Alert and oriented X 3, normal strength and tone. Normal symmetric reflexes. Normal coordination and gait  ECOG PERFORMANCE STATUS: 2 - Symptomatic, <50% confined to bed  Blood pressure (!) 142/57, pulse 66, temperature 98.5 F (36.9 C), temperature source Oral, resp. rate 17, height 5\' 1"   (1.549 m), weight 155 lb 14.4 oz (70.7 kg), SpO2 100 %.  LABORATORY DATA: Lab Results  Component Value Date   WBC 3.9 06/18/2017   HGB 9.6 (L) 06/18/2017   HCT 28.6 (L) 06/18/2017   MCV 101.4 (H) 06/18/2017   PLT 139 (L) 06/18/2017      Chemistry      Component Value Date/Time   NA 144 06/12/2017 1116   K 3.0 (LL) 06/12/2017 1116   CL 99 (L) 09/29/2015 0544   CO2 22 06/12/2017 1116   BUN 11.3 06/12/2017 1116   CREATININE 0.8 06/12/2017 1116      Component Value Date/Time   CALCIUM 8.0 (L) 06/12/2017 1116   ALKPHOS 66 06/12/2017 1116   AST 17 06/12/2017 1116   ALT 20 06/12/2017 1116   BILITOT 0.34 06/12/2017 1116       RADIOGRAPHIC STUDIES: Ct Chest W Contrast  Result Date: 05/26/2017 CLINICAL DATA:  Stage IVA squamous cell lung carcinoma diagnosed March 2018. Ongoing chemotherapy. Remote history of LEFT breast cancer. EXAM: CT CHEST, ABDOMEN, AND PELVIS WITH CONTRAST TECHNIQUE: Multidetector CT imaging of the chest, abdomen and pelvis was  performed following the standard protocol during bolus administration of intravenous contrast. CONTRAST:  142mL ISOVUE-300 IOPAMIDOL (ISOVUE-300) INJECTION 61% COMPARISON:  PET-CT scan 02/26/2017 FINDINGS: CT CHEST FINDINGS Cardiovascular: Coronary artery calcification and aortic atherosclerotic calcification. No pericardial effusion. No central pulmonary embolism. Mediastinum/Nodes: LEFT axillary nodal dissection. Port in the RIGHT chest wall. Pacemaker in LEFT chest wall. No supraclavicular adenopathy. Prevascular lymph nodes are decreased in size. For example 16 mm short axis lymph node compares to 27 mm. No new mediastinal adenopathy. No hilar adenopathy. Lungs/Pleura: Mass in the RIGHT upper lobe decrease in size and now central cavitation. Lesion measures 1.9 cm decreased from 5.1 cm (image 49, series 4. Nodule lesion in the LEFT upper lobe also decreased in size measuring 0.8 cm decreased from 2.3 cm. No new pulmonary nodules.  Musculoskeletal: No aggressive osseous lesion. CT ABDOMEN AND PELVIS FINDINGS Hepatobiliary: No focal hepatic lesion. No biliary ductal dilatation. Gallbladder is normal. Common bile duct is normal. Pancreas: Pancreas is normal. No ductal dilatation. No pancreatic inflammation. Spleen: Normal spleen Adrenals/urinary tract: Adrenal glands are normal. Benign cysts of the LEFT kidney. Bilateral renal cortical thinning. Ureters and bladder normal. Stomach/Bowel: Stomach, small bowel, appendix, and cecum are normal. The colon and rectosigmoid colon are normal. Vascular/Lymphatic: Abdominal aorta is normal caliber with atherosclerotic calcification. There is no retroperitoneal or periportal lymphadenopathy. No pelvic lymphadenopathy. Reproductive: Post hysterectomy. Bilateral fat filled inguinal hernias. Other: No free fluid. Musculoskeletal: No aggressive osseous lesion. IMPRESSION: Chest Impression: 1. Interval decrease in size of RIGHT upper lobe mass. Mass now with central cavitation. 2. Interval decrease in size of LEFT upper lobe pulmonary nodule. 3. Interval decrease in size of prevascular metastatic lymph nodes. 4. No evidence of disease progression in the thorax. Abdomen / Pelvis Impression: 1. No evidence of metastatic disease in the abdomen pelvis. 2.  Aortic Atherosclerosis (ICD10-I70.0). Electronically Signed   By: Suzy Bouchard M.D.   On: 05/26/2017 11:28   Ct Abdomen Pelvis W Contrast  Result Date: 05/26/2017 CLINICAL DATA:  Stage IVA squamous cell lung carcinoma diagnosed March 2018. Ongoing chemotherapy. Remote history of LEFT breast cancer. EXAM: CT CHEST, ABDOMEN, AND PELVIS WITH CONTRAST TECHNIQUE: Multidetector CT imaging of the chest, abdomen and pelvis was performed following the standard protocol during bolus administration of intravenous contrast. CONTRAST:  136mL ISOVUE-300 IOPAMIDOL (ISOVUE-300) INJECTION 61% COMPARISON:  PET-CT scan 02/26/2017 FINDINGS: CT CHEST FINDINGS Cardiovascular:  Coronary artery calcification and aortic atherosclerotic calcification. No pericardial effusion. No central pulmonary embolism. Mediastinum/Nodes: LEFT axillary nodal dissection. Port in the RIGHT chest wall. Pacemaker in LEFT chest wall. No supraclavicular adenopathy. Prevascular lymph nodes are decreased in size. For example 16 mm short axis lymph node compares to 27 mm. No new mediastinal adenopathy. No hilar adenopathy. Lungs/Pleura: Mass in the RIGHT upper lobe decrease in size and now central cavitation. Lesion measures 1.9 cm decreased from 5.1 cm (image 49, series 4. Nodule lesion in the LEFT upper lobe also decreased in size measuring 0.8 cm decreased from 2.3 cm. No new pulmonary nodules. Musculoskeletal: No aggressive osseous lesion. CT ABDOMEN AND PELVIS FINDINGS Hepatobiliary: No focal hepatic lesion. No biliary ductal dilatation. Gallbladder is normal. Common bile duct is normal. Pancreas: Pancreas is normal. No ductal dilatation. No pancreatic inflammation. Spleen: Normal spleen Adrenals/urinary tract: Adrenal glands are normal. Benign cysts of the LEFT kidney. Bilateral renal cortical thinning. Ureters and bladder normal. Stomach/Bowel: Stomach, small bowel, appendix, and cecum are normal. The colon and rectosigmoid colon are normal. Vascular/Lymphatic: Abdominal aorta is normal caliber with  atherosclerotic calcification. There is no retroperitoneal or periportal lymphadenopathy. No pelvic lymphadenopathy. Reproductive: Post hysterectomy. Bilateral fat filled inguinal hernias. Other: No free fluid. Musculoskeletal: No aggressive osseous lesion. IMPRESSION: Chest Impression: 1. Interval decrease in size of RIGHT upper lobe mass. Mass now with central cavitation. 2. Interval decrease in size of LEFT upper lobe pulmonary nodule. 3. Interval decrease in size of prevascular metastatic lymph nodes. 4. No evidence of disease progression in the thorax. Abdomen / Pelvis Impression: 1. No evidence of  metastatic disease in the abdomen pelvis. 2.  Aortic Atherosclerosis (ICD10-I70.0). Electronically Signed   By: Suzy Bouchard M.D.   On: 05/26/2017 11:28    ASSESSMENT AND PLAN:  This is a very pleasant 83 years old white female with a stage IV non-small cell lung cancer, adenocarcinoma with PDL 1 expression of 5%. The patient is currently undergoing systemic chemotherapy with carboplatin and paclitaxel, status post 4 cycles and has been tolerating her treatment well except for increasing fatigue and weakness as well as development of peripheral neuropathy. I had a lengthy discussion with the patient and her family today about her condition and treatment options. I gave the patient the option of discontinuing her treatment at this point versus proceeding with 1 more cycles with reduced dose of carboplatin for AUC of 4 and paclitaxel 150 MG/M2. The patient would like to proceed with one more cycle of her treatment and she will proceed with cycle #5 today. We will not complete cycle #6. I will arrange for her to come back for follow-up visit in 6 weeks for reevaluation with repeat CT scan of the chest, abdomen and pelvis for restaging of her disease. For the peripheral neuropathy, I will start the patient on Neurontin 100 mg by mouth 3 times a day. For hypertension, she will continue with her current blood pressure medication. She was advised to call immediately if she has any concerning symptoms in the interval. The patient voices understanding of current disease status and treatment options and is in agreement with the current care plan. All questions were answered. The patient knows to call the clinic with any problems, questions or concerns. We can certainly see the patient much sooner if necessary. I spent 15 minutes counseling the patient face to face. The total time spent in the appointment was 25 minutes.  Disclaimer: This note was dictated with voice recognition software. Similar sounding  words can inadvertently be transcribed and may not be corrected upon review.

## 2017-06-18 NOTE — Patient Instructions (Signed)
[facility] Discharge Instructions for Patients Receiving Chemotherapy  Today you received the following chemotherapy agents Taxol/Carboplatin  To help prevent nausea and vomiting after your treatment, we encourage you to take your nausea medication    If you develop nausea and vomiting that is not controlled by your nausea medication, call the clinic.   BELOW ARE SYMPTOMS THAT SHOULD BE REPORTED IMMEDIATELY:  *FEVER GREATER THAN 100.5 F  *CHILLS WITH OR WITHOUT FEVER  NAUSEA AND VOMITING THAT IS NOT CONTROLLED WITH YOUR NAUSEA MEDICATION  *UNUSUAL SHORTNESS OF BREATH  *UNUSUAL BRUISING OR BLEEDING  TENDERNESS IN MOUTH AND THROAT WITH OR WITHOUT PRESENCE OF ULCERS  *URINARY PROBLEMS  *BOWEL PROBLEMS  UNUSUAL RASH Items with * indicate a potential emergency and should be followed up as soon as possible.  Feel free to call the clinic you have any questions or concerns. The clinic phone number is [phone].  Please show the CHEMO ALERT CARD at check-in to the Emergency Department and triage nurse.   

## 2017-06-19 ENCOUNTER — Other Ambulatory Visit: Payer: Self-pay | Admitting: Medical Oncology

## 2017-06-22 ENCOUNTER — Other Ambulatory Visit: Payer: Self-pay

## 2017-06-22 ENCOUNTER — Emergency Department (HOSPITAL_COMMUNITY): Payer: Medicare Other

## 2017-06-22 ENCOUNTER — Encounter (HOSPITAL_COMMUNITY): Payer: Self-pay

## 2017-06-22 ENCOUNTER — Emergency Department (HOSPITAL_COMMUNITY)
Admission: EM | Admit: 2017-06-22 | Discharge: 2017-06-23 | Disposition: A | Discharge status: Home / Self Care | Payer: Medicare Other | Attending: Emergency Medicine | Admitting: Emergency Medicine

## 2017-06-22 DIAGNOSIS — Z7901 Long term (current) use of anticoagulants: Secondary | ICD-10-CM | POA: Insufficient documentation

## 2017-06-22 DIAGNOSIS — E860 Dehydration: Secondary | ICD-10-CM

## 2017-06-22 DIAGNOSIS — Z853 Personal history of malignant neoplasm of breast: Secondary | ICD-10-CM | POA: Insufficient documentation

## 2017-06-22 DIAGNOSIS — I2510 Atherosclerotic heart disease of native coronary artery without angina pectoris: Secondary | ICD-10-CM | POA: Diagnosis not present

## 2017-06-22 DIAGNOSIS — I5032 Chronic diastolic (congestive) heart failure: Secondary | ICD-10-CM | POA: Insufficient documentation

## 2017-06-22 DIAGNOSIS — C3490 Malignant neoplasm of unspecified part of unspecified bronchus or lung: Secondary | ICD-10-CM | POA: Insufficient documentation

## 2017-06-22 DIAGNOSIS — Z87891 Personal history of nicotine dependence: Secondary | ICD-10-CM | POA: Insufficient documentation

## 2017-06-22 DIAGNOSIS — Z950 Presence of cardiac pacemaker: Secondary | ICD-10-CM | POA: Insufficient documentation

## 2017-06-22 DIAGNOSIS — J449 Chronic obstructive pulmonary disease, unspecified: Secondary | ICD-10-CM | POA: Insufficient documentation

## 2017-06-22 DIAGNOSIS — E039 Hypothyroidism, unspecified: Secondary | ICD-10-CM | POA: Diagnosis not present

## 2017-06-22 DIAGNOSIS — R0602 Shortness of breath: Secondary | ICD-10-CM | POA: Diagnosis present

## 2017-06-22 DIAGNOSIS — J3489 Other specified disorders of nose and nasal sinuses: Secondary | ICD-10-CM | POA: Insufficient documentation

## 2017-06-22 DIAGNOSIS — Z79899 Other long term (current) drug therapy: Secondary | ICD-10-CM | POA: Diagnosis not present

## 2017-06-22 DIAGNOSIS — I110 Hypertensive heart disease with heart failure: Secondary | ICD-10-CM | POA: Diagnosis not present

## 2017-06-22 DIAGNOSIS — R110 Nausea: Secondary | ICD-10-CM | POA: Insufficient documentation

## 2017-06-22 LAB — BASIC METABOLIC PANEL
Anion gap: 11 (ref 5–15)
BUN: 28 mg/dL — AB (ref 6–20)
CHLORIDE: 100 mmol/L — AB (ref 101–111)
CO2: 27 mmol/L (ref 22–32)
Calcium: 9.7 mg/dL (ref 8.9–10.3)
Creatinine, Ser: 1.09 mg/dL — ABNORMAL HIGH (ref 0.44–1.00)
GFR calc Af Amer: 52 mL/min — ABNORMAL LOW (ref >60)
GFR calc non Af Amer: 45 mL/min — ABNORMAL LOW (ref >60)
GLUCOSE: 143 mg/dL — AB (ref 65–99)
POTASSIUM: 4.4 mmol/L (ref 3.5–5.1)
Sodium: 138 mmol/L (ref 135–145)

## 2017-06-22 LAB — GLUCOSE, CAPILLARY: Glucose-Capillary: 152 mg/dL — ABNORMAL HIGH (ref 65–99)

## 2017-06-22 MED ORDER — SODIUM CHLORIDE 0.9 % IV BOLUS (SEPSIS)
1000.0000 mL | Freq: Once | INTRAVENOUS | Status: AC
  Administered 2017-06-22: 1000 mL via INTRAVENOUS

## 2017-06-22 MED ORDER — LIDOCAINE-PRILOCAINE 2.5-2.5 % EX CREA
TOPICAL_CREAM | Freq: Once | CUTANEOUS | Status: AC
Start: 2017-06-22 — End: 2017-06-22
  Administered 2017-06-22: 23:00:00 via TOPICAL
  Filled 2017-06-22: qty 5

## 2017-06-22 NOTE — ED Notes (Signed)
Patient request lab draw from PORT. 

## 2017-06-22 NOTE — ED Notes (Signed)
No respiratory or acute distress noted alert and oriented x 3 call light in reach no reaction to medication noted able to speak in full sentences family at bedside.

## 2017-06-22 NOTE — ED Notes (Signed)
No respiratory or acute distress noted alert and oriented x 3 able to speak in full sentences family at bedside call light in reach.

## 2017-06-22 NOTE — ED Triage Notes (Addendum)
Pt had chemo on Wednesday and states that since then she has been experiencing SOB, nausea, neuropathy in her legs and difficulty ambulating. She denies vomiting or fever at home. A&Ox4. No distress in triage. Also states that she was hypotensive at home today.

## 2017-06-22 NOTE — ED Provider Notes (Signed)
[place] DEPT Provider Note   CSN: [id number] Arrival date & time: 06/22/17  2115     History   Chief Complaint Chief Complaint  Patient presents with  . Shortness of Breath    cancer pt  . Weakness  . Nausea    HPI Maria Washington is a 83 y.o. female.  The history is provided by the patient.  Shortness of Breath  This is a recurrent problem. The average episode lasts 3 days. The problem occurs continuously.The problem has been gradually worsening. Associated symptoms include rhinorrhea (chronic since starting chemoTx) and leg pain (chronic bilteral; from neuropathy). Pertinent negatives include no fever, no cough, no hemoptysis, no wheezing, no orthopnea, no abdominal pain and no leg swelling. Precipitated by: Activity. Treatments tried: Rest. The treatment provided moderate relief. Associated medical issues do not include asthma or COPD.   Patient reports that she always gets similar symptoms 2-3 days following chemotherapy was last anywhere from several days to 1 week. Per patient and this is consistent with her response to chemotherapy, which she last had 5 days ago.  Past Medical History:  Diagnosis Date  . Aortic atherosclerosis ([place]) 02/10/2017  . Arthritis   . CAD (coronary artery disease), native coronary artery    3 vessel calcification noted on CT scan   . Cardiac pacemaker in situ 12/05/2011  . Chronic diastolic congestive heart failure ([place])   . COPD (chronic obstructive pulmonary disease) (HCC)    spot on lung  . Dehydration 06/04/2017  . Encounter for antineoplastic chemotherapy 03/04/2017  . Goals of care, counseling/discussion 03/17/2017  . History of breast cancer    Treated with lumpectomy, radiation, and chemo sees Dr. Ralene Ok   . Hypertensive heart disease without CHF   . Hypokalemia 04/18/2017  . Hypothyroid   . Mitral regurgitation   . Paroxysmal atrial fibrillation (HCC)    CHA2DS2VASC score 5  . Second degree heart block   . Stage IV squamous  cell carcinoma of right lung ([place]) 03/04/2017    Patient Active Problem List   Diagnosis Date Noted  . Peripheral neuropathy 06/18/2017  . Port catheter in place 04/07/2017  . Goals of care, counseling/discussion 03/17/2017  . Stage IV squamous cell carcinoma of right lung ([place]) 03/04/2017  . Encounter for antineoplastic chemotherapy 03/04/2017  . Lung mass 02/10/2017  . Aortic atherosclerosis ([place]) 02/10/2017  . Obesity (BMI 30.0-34.9) 02/10/2017  . Long term current use of anticoagulant therapy   . Mitral regurgitation   . CAD (coronary artery disease), native coronary artery   . Chronic diastolic congestive heart failure ([place])   . Paroxysmal atrial fibrillation (HCC)   . Cardiac pacemaker in situ 12/04/2011  . COPD (chronic obstructive pulmonary disease) ([place]) 12/02/2011  . History of breast cancer   . Hypertensive heart disease without CHF   . Hypothyroidism     Past Surgical History:  Procedure Laterality Date  . ABDOMINAL HYSTERECTOMY    . BLADDER SUSPENSION    . BREAST LUMPECTOMY     lumpectomy  . CARPAL TUNNEL RELEASE     left wrist  . CHOLECYSTECTOMY    . FINGER SURGERY    . IR FLUORO GUIDE PORT INSERTION RIGHT  03/12/2017  . IR US GUIDE VASC ACCESS RIGHT  03/12/2017  . PERMANENT PACEMAKER INSERTION N/A 12/04/2011   Procedure: PERMANENT PACEMAKER INSERTION;  Surgeon: Evans Lance, MD;  Location: [facility] CATH LAB;  Service: Cardiovascular;  Laterality: N/A;  . SHOULDER SURGERY    . TONSILLECTOMY AND  ADENOIDECTOMY      OB History    No data available       Home Medications    Prior to Admission medications   Medication Sig Start Date End Date Taking? Authorizing Provider  alendronate (FOSAMAX) 70 MG tablet Take 70 mg by mouth every 7 (seven) days. Monday 03/21/14   [provider]  apixaban (ELIQUIS) 5 MG TABS tablet Take 1 tablet (5 mg total) by mouth 2 (two) times daily. 05/03/14   York, Marianne L, PA-C  BIOTIN 5000 PO Take 5,000 mcg by mouth daily.      [provider]  calcium-vitamin D (OSCAL WITH D) 500-200 MG-UNIT per tablet Take 1 tablet by mouth 2 (two) times daily.      [provider]  cholecalciferol (VITAMIN D) 1000 units tablet Take 1 capsule by mouth daily.    [provider]  diltiazem (CARDIZEM CD) 120 MG 24 hr capsule Take 120 mg by mouth daily. 05/14/17   [provider]  folic acid (FOLVITE) 1 MG tablet Take 1 mg by mouth daily.      [provider]  furosemide (LASIX) 40 MG tablet Take 40 mg by mouth daily.     [provider]  gabapentin (NEURONTIN) 100 MG capsule Take 1 capsule (100 mg total) by mouth 3 (three) times daily. 06/18/17   Curt Bears, MD  levothyroxine (SYNTHROID, LEVOTHROID) 137 MCG tablet Take 137 mcg by mouth daily before breakfast.    [provider]  lidocaine-prilocaine (EMLA) cream Apply 1 application topically as needed. 03/04/17   Curt Bears, MD  loperamide (IMODIUM) 2 MG capsule Take 2 mg by mouth as needed for diarrhea or loose stools.    [provider]  Omega-3 Fatty Acids (FISH OIL) 1200 MG CAPS Take 1,200 mg by mouth 2 (two) times daily.     [provider]  potassium chloride SA (K-DUR,KLOR-CON) 20 MEQ tablet Take 20-40 mEq by mouth See admin instructions. 2 tabs in the morning, and 1 tab in the evening    [provider]  prochlorperazine (COMPAZINE) 10 MG tablet Take 1 tablet (10 mg total) by mouth every 6 (six) hours as needed for nausea or vomiting. Patient not taking: Reported on 06/18/2017 03/04/17   Curt Bears, MD  vitamin B-12 (CYANOCOBALAMIN) 1000 MCG tablet Take 1,000 mcg by mouth daily.      [provider]    Family History Family History  Problem Relation Age of Onset  . Cancer Father   . Heart attack Mother   . Cancer Brother     Social History Social History  Substance Use Topics  . Smoking status: Former Smoker    Packs/day: 1.00    Years: 60.00    Types:  Cigarettes    Quit date: 06/07/2013  . Smokeless tobacco: Never Used  . Alcohol use No     Allergies   Clindamycin/lincomycin and Penicillins   Review of Systems Review of Systems  Constitutional: Negative for fever.  HENT: Positive for rhinorrhea (chronic since starting chemoTx).   Respiratory: Positive for shortness of breath. Negative for cough, hemoptysis and wheezing.   Cardiovascular: Negative for orthopnea and leg swelling.  Gastrointestinal: Negative for abdominal pain.  All other systems are reviewed and are negative for acute change except as noted in the HPI    Physical Exam Updated Vital Signs BP 108/74 (BP Location: Right Arm)   Pulse 94   Temp (!) 97.5 F (36.4 C) (Oral)  Resp 18   SpO2 100%   Physical Exam  Constitutional: She is oriented to person, place, and time. She appears well-developed and well-nourished. No distress.  HENT:  Head: Normocephalic and atraumatic.  Nose: Nose normal.  Mouth/Throat: Mucous membranes are dry.  Alopecia  Eyes: Pupils are equal, round, and reactive to light. Conjunctivae and EOM are normal. Right eye exhibits no discharge. Left eye exhibits no discharge. No scleral icterus.  Neck: Normal range of motion. Neck supple.  Cardiovascular: Normal rate and regular rhythm.  Exam reveals no gallop and no friction rub.   No murmur heard. Pulmonary/Chest: Effort normal and breath sounds normal. No stridor. No respiratory distress. She has no rales.  Abdominal: Soft. She exhibits no distension. There is no tenderness.  Musculoskeletal: She exhibits no edema or tenderness.  Neurological: She is alert and oriented to person, place, and time.  Skin: Skin is warm and dry. No rash noted. She is not diaphoretic. No erythema.  Psychiatric: She has a normal mood and affect.  Vitals reviewed.    ED Treatments / Results  Labs (all labs ordered are listed, but only abnormal results are displayed) Labs Reviewed  GLUCOSE, CAPILLARY -  Abnormal; Notable for the following:       Result Value   Glucose-Capillary 152 (*)    All other components within normal limits  BASIC METABOLIC PANEL  CBC  TROPONIN I  CBG MONITORING, ED    EKG  EKG Interpretation None       Radiology Dg Chest 2 View  Result Date: 06/22/2017 CLINICAL DATA:  Shortness of breath, nausea after chemotherapy for lung cancer. EXAM: CHEST  2 VIEW COMPARISON:  Chest CT May 26, 2017 FINDINGS: Cardiomediastinal silhouette is normal. RIGHT upper lobe consolidation corresponding to known neoplasm. No pleural effusion. No pneumothorax. Surgical clips project in LEFT axilla. Dual lead LEFT cardiac pacemaker in situ knee. Soft tissue planes and included osseous structures are nonacute. IMPRESSION: No acute cardiopulmonary process. Re- demonstration of RIGHT upper lobe neoplasm. Electronically Signed   By: Elon Alas M.D.   On: 06/22/2017 22:25    Procedures Procedures (including critical care time)  Medications Ordered in ED Medications  sodium chloride 0.9 % bolus 1,000 mL (not administered)  lidocaine-prilocaine (EMLA) cream ( Topical Given 06/22/17 2242)     Initial Impression / Assessment and Plan / ED Course  I have reviewed the triage vital signs and the nursing notes.  Pertinent labs & imaging results that were available during my care of the patient were reviewed by me and considered in my medical decision making (see chart for details).     Chronically ill-appearing. Stable vital signs. No respiratory distress. Lungs clear to auscultation.   Appears to be most consistent with response to chemotherapy.  Patient is denying any chest pain. I have low suspicion for cardiac etiology. Patient does have his for pulmonary embolism however do not feel that her clinical presentation consistent with a pulmonary embolism.  Chest x-ray without evidence of pneumonia, pulmonary edema, pleural effusions.  Will get screening labs and provide patient  with IVF given the clinical signs of dehydration.  Patient care turned over to Dr Betsey Holiday at 2330. Patient case and results discussed in detail; please see their note for further ED managment.       Fatima Blank, MD 06/22/17 [phone]

## 2017-06-23 LAB — CBC
HEMATOCRIT: 30.7 % — AB (ref 36.0–46.0)
Hemoglobin: 10.4 g/dL — ABNORMAL LOW (ref 12.0–15.0)
MCH: 33.8 pg (ref 26.0–34.0)
MCHC: 33.9 g/dL (ref 30.0–36.0)
MCV: 99.7 fL (ref 78.0–100.0)
Platelets: 106 10*3/uL — ABNORMAL LOW (ref 150–400)
RBC: 3.08 MIL/uL — ABNORMAL LOW (ref 3.87–5.11)
RDW: 17.5 % — AB (ref 11.5–15.5)
WBC: 3.7 10*3/uL — AB (ref 4.0–10.5)

## 2017-06-23 LAB — TROPONIN I

## 2017-06-23 MED ORDER — SODIUM CHLORIDE 0.9 % IV BOLUS (SEPSIS)
1000.0000 mL | Freq: Once | INTRAVENOUS | Status: AC
  Administered 2017-06-23: 1000 mL via INTRAVENOUS

## 2017-06-23 MED ORDER — HEPARIN SOD (PORK) LOCK FLUSH 100 UNIT/ML IV SOLN
500.0000 [IU] | Freq: Once | INTRAVENOUS | Status: AC
  Administered 2017-06-23: 500 [IU]
  Filled 2017-06-23: qty 5

## 2017-06-23 NOTE — ED Notes (Signed)
No respiratory or acute distress noted resting in bed with eyes closed call light in reach family at bedside.

## 2017-06-23 NOTE — ED Provider Notes (Signed)
Patient signed out to me to follow-up on her improvement with IV fluids. Patient currently undergoing chemotherapy, has required IV fluids after administration of chemotherapy in the past. She was feeling weak and dizzy earlier today. She was noted to be orthostatic at arrival. She was given a liter of fluids, felt symptomatically improved but still was orthostatic. After a second liter, however, she is no longer orthostatic and feels much improvement. Her lab work was otherwise unremarkable. She is appropriate for discharge, follow-up with primary care and oncology.   Orpah Greek, MD 06/23/17 [phone]

## 2017-06-23 NOTE — ED Notes (Signed)
Ambulated pt with some assist no respiratory or acute distress noted alert and oriented x 3 no respiratory or acute distress noted family with pt.

## 2017-06-25 ENCOUNTER — Ambulatory Visit (HOSPITAL_BASED_OUTPATIENT_CLINIC_OR_DEPARTMENT_OTHER): Payer: Medicare Other

## 2017-06-25 ENCOUNTER — Other Ambulatory Visit (HOSPITAL_BASED_OUTPATIENT_CLINIC_OR_DEPARTMENT_OTHER): Payer: Medicare Other

## 2017-06-25 VITALS — BP 124/56 | HR 87 | Temp 97.7°F | Resp 18

## 2017-06-25 DIAGNOSIS — C3411 Malignant neoplasm of upper lobe, right bronchus or lung: Secondary | ICD-10-CM

## 2017-06-25 DIAGNOSIS — Z452 Encounter for adjustment and management of vascular access device: Secondary | ICD-10-CM

## 2017-06-25 DIAGNOSIS — Z95828 Presence of other vascular implants and grafts: Secondary | ICD-10-CM

## 2017-06-25 DIAGNOSIS — C3491 Malignant neoplasm of unspecified part of right bronchus or lung: Secondary | ICD-10-CM

## 2017-06-25 LAB — COMPREHENSIVE METABOLIC PANEL
ALBUMIN: 3.6 g/dL (ref 3.5–5.0)
ALK PHOS: 68 U/L (ref 40–150)
ALT: 26 U/L (ref 0–55)
AST: 18 U/L (ref 5–34)
Anion Gap: 13 mEq/L — ABNORMAL HIGH (ref 3–11)
BUN: 21 mg/dL (ref 7.0–26.0)
CO2: 20 mEq/L — ABNORMAL LOW (ref 22–29)
Calcium: 8.6 mg/dL (ref 8.4–10.4)
Chloride: 107 mEq/L (ref 98–109)
Creatinine: 1 mg/dL (ref 0.6–1.1)
EGFR: 54 mL/min/{1.73_m2} — AB (ref >90)
GLUCOSE: 174 mg/dL — AB (ref 70–140)
POTASSIUM: 3.7 meq/L (ref 3.5–5.1)
SODIUM: 140 meq/L (ref 136–145)
Total Bilirubin: 0.62 mg/dL (ref 0.20–1.20)
Total Protein: 6.9 g/dL (ref 6.4–8.3)

## 2017-06-25 LAB — CBC WITH DIFFERENTIAL/PLATELET
BASO%: 0.5 % (ref 0.0–2.0)
BASOS ABS: 0 10*3/uL (ref 0.0–0.1)
EOS ABS: 0 10*3/uL (ref 0.0–0.5)
EOS%: 1.1 % (ref 0.0–7.0)
HCT: 27.3 % — ABNORMAL LOW (ref 34.8–46.6)
HEMOGLOBIN: 9 g/dL — AB (ref 11.6–15.9)
LYMPH%: 40.4 % (ref 14.0–49.7)
MCH: 33.6 pg (ref 25.1–34.0)
MCHC: 33 g/dL (ref 31.5–36.0)
MCV: 101.9 fL — AB (ref 79.5–101.0)
MONO#: 0 10*3/uL — ABNORMAL LOW (ref 0.1–0.9)
MONO%: 1.6 % (ref 0.0–14.0)
NEUT#: 1.1 10*3/uL — ABNORMAL LOW (ref 1.5–6.5)
NEUT%: 56.4 % (ref 38.4–76.8)
Platelets: 93 10*3/uL — ABNORMAL LOW (ref 145–400)
RBC: 2.68 10*6/uL — ABNORMAL LOW (ref 3.70–5.45)
RDW: 17 % — ABNORMAL HIGH (ref 11.2–14.5)
WBC: 1.9 10*3/uL — ABNORMAL LOW (ref 3.9–10.3)
lymph#: 0.8 10*3/uL — ABNORMAL LOW (ref 0.9–3.3)

## 2017-06-25 MED ORDER — HEPARIN SOD (PORK) LOCK FLUSH 100 UNIT/ML IV SOLN
500.0000 [IU] | Freq: Once | INTRAVENOUS | Status: AC
  Administered 2017-06-25: 500 [IU]
  Filled 2017-06-25: qty 5

## 2017-06-25 MED ORDER — SODIUM CHLORIDE 0.9% FLUSH
10.0000 mL | Freq: Once | INTRAVENOUS | Status: AC
  Administered 2017-06-25: 10 mL
  Filled 2017-06-25: qty 10

## 2017-06-25 NOTE — Patient Instructions (Signed)
Implanted Port Home Guide An implanted port is a type of central line that is placed under the skin. Central lines are used to provide IV access when treatment or nutrition needs to be given through a person's veins. Implanted ports are used for long-term IV access. An implanted port may be placed because:  You need IV medicine that would be irritating to the small veins in your hands or arms.  You need long-term IV medicines, such as antibiotics.  You need IV nutrition for a long period.  You need frequent blood draws for lab tests.  You need dialysis.  Implanted ports are usually placed in the chest area, but they can also be placed in the upper arm, the abdomen, or the leg. An implanted port has two main parts:  Reservoir. The reservoir is round and will appear as a small, raised area under your skin. The reservoir is the part where a needle is inserted to give medicines or draw blood.  Catheter. The catheter is a thin, flexible tube that extends from the reservoir. The catheter is placed into a large vein. Medicine that is inserted into the reservoir goes into the catheter and then into the vein.  How will I care for my incision site? Do not get the incision site wet. Bathe or shower as directed by your health care provider. How is my port accessed? Special steps must be taken to access the port:  Before the port is accessed, a numbing cream can be placed on the skin. This helps numb the skin over the port site.  Your health care provider uses a sterile technique to access the port. ? Your health care provider must put on a mask and sterile gloves. ? The skin over your port is cleaned carefully with an antiseptic and allowed to dry. ? The port is gently pinched between sterile gloves, and a needle is inserted into the port.  Only "non-coring" port needles should be used to access the port. Once the port is accessed, a blood return should be checked. This helps ensure that the port  is in the vein and is not clogged.  If your port needs to remain accessed for a constant infusion, a clear (transparent) bandage will be placed over the needle site. The bandage and needle will need to be changed every week, or as directed by your health care provider.  Keep the bandage covering the needle clean and dry. Do not get it wet. Follow your health care provider's instructions on how to take a shower or bath while the port is accessed.  If your port does not need to stay accessed, no bandage is needed over the port.  What is flushing? Flushing helps keep the port from getting clogged. Follow your health care provider's instructions on how and when to flush the port. Ports are usually flushed with saline solution or a medicine called heparin. The need for flushing will depend on how the port is used.  If the port is used for intermittent medicines or blood draws, the port will need to be flushed: ? After medicines have been given. ? After blood has been drawn. ? As part of routine maintenance.  If a constant infusion is running, the port may not need to be flushed.  How long will my port stay implanted? The port can stay in for as long as your health care provider thinks it is needed. When it is time for the port to come out, surgery will be   done to remove it. The procedure is similar to the one performed when the port was put in. When should I seek immediate medical care? When you have an implanted port, you should seek immediate medical care if:  You notice a bad smell coming from the incision site.  You have swelling, redness, or drainage at the incision site.  You have more swelling or pain at the port site or the surrounding area.  You have a fever that is not controlled with medicine.  This information is not intended to replace advice given to you by your health care provider. Make sure you discuss any questions you have with your health care provider. Document  Released: 11/25/2005 Document Revised: 05/02/2016 Document Reviewed: 08/02/2013 Elsevier Interactive Patient Education  2017 Elsevier Inc.  

## 2017-06-27 ENCOUNTER — Other Ambulatory Visit: Payer: Self-pay

## 2017-06-27 ENCOUNTER — Ambulatory Visit (HOSPITAL_COMMUNITY)
Admission: RE | Admit: 2017-06-27 | Discharge: 2017-06-27 | Disposition: A | Discharge status: Home / Self Care | Payer: Medicare Other | Source: Ambulatory Visit | Attending: Internal Medicine | Admitting: Internal Medicine

## 2017-06-27 ENCOUNTER — Telehealth: Payer: Self-pay | Admitting: Internal Medicine

## 2017-06-27 ENCOUNTER — Telehealth: Payer: Self-pay

## 2017-06-27 DIAGNOSIS — Z452 Encounter for adjustment and management of vascular access device: Secondary | ICD-10-CM | POA: Insufficient documentation

## 2017-06-27 MED ORDER — SODIUM CHLORIDE 0.9% FLUSH
10.0000 mL | INTRAVENOUS | Status: AC | PRN
  Administered 2017-06-27: 10 mL

## 2017-06-27 MED ORDER — HEPARIN SOD (PORK) LOCK FLUSH 100 UNIT/ML IV SOLN
500.0000 [IU] | INTRAVENOUS | Status: AC | PRN
  Administered 2017-06-27: 500 [IU]
  Filled 2017-06-27: qty 5

## 2017-06-27 MED ORDER — SODIUM CHLORIDE 0.9 % IV SOLN
Freq: Once | INTRAVENOUS | Status: AC
  Administered 2017-06-27: 11:00:00 via INTRAVENOUS

## 2017-06-27 NOTE — Procedures (Signed)
[facility]  Procedure Note  Maria Washington GBM:[id number] DOB: [birth year] DOA: 06/27/2017   Dr. Lebron Conners  Associated Diagnosis: diarrhea  Procedure Note: port accessed, I liter of NS infused over 2 hours.  Port flushed and de-accessed   Condition During Procedure: patient stable during procedure   Condition at Discharge:  Stable.  Patient daughter is present    Roberto Scales, RN  [facility]

## 2017-06-27 NOTE — Telephone Encounter (Signed)
Daughter called for fluids b/c pt has diarrhea Called back and pt stated it was not fluids but neuropathy pain that is bothering her. In her legs and hands. She is using her gabapentin 1 TID as directed and it is not helping. In 2014 she had nerve damage and had 2 shots (one was dilaudid) that took her pain away. She is asking if she can get a pain shot.   She is having diarrhea, it is very soft loose, greater than 8 times yesterday but very small amounts. She is drinking 12 oz water, 8 oz coffee, 12 oz mountain dew. Day before was 38 fluid oz. She is feeling weak and woozy. Instructed pt to attempt to drink more water with a goal of 64 oz.   S/w Dr Lebron Conners (Dr Julien Nordmann off and Dr Irene Limbo not present), no change to gabapentin and no shot at present. The neuropathy is d/t the chemo and a shot won't help. We will arrange for pt to receive 1 liter of NS fluid. Told daughter to wait for a call with the time.  Dr Lebron Conners also ordered appt Mon, Wed, Fri next week for fluids. inbasket sent.  S/w Beth in [facility] and [facility] and called daughter. 1 option is to use her imodium more frequently and drink gatorade and water, not mountain dew and see how she does. 2nd option receive 1 liter of fluids at [facility] today at 1030. Daughter and pt chose to come to [facility] today. This RN stressed need to drink more fluid and use imodium over the weekend.

## 2017-06-27 NOTE — Progress Notes (Signed)
Patient C/O swelling in the legs and lower extremity edema.  There is a rash noted to bilateral lower legs as well as 1+ edema.  Notified triage nurse Juliann Pulse at [facility].  Instructed patient to follow up with Dr. Felipa Eth, or if symptoms worsen, go to ED.  RN will continue to monitor patient while she is here getting fluids.

## 2017-06-27 NOTE — Telephone Encounter (Signed)
sw pt to confirm ivf next week per sch msg

## 2017-06-27 NOTE — Discharge Instructions (Signed)
Pt received 1036mls normal saline today.  Patient and daughter education on signs/symptoms of fluid overload.  Patient/daughter instructed to follow up with PCP as needed.

## 2017-06-30 ENCOUNTER — Ambulatory Visit (HOSPITAL_BASED_OUTPATIENT_CLINIC_OR_DEPARTMENT_OTHER): Payer: Medicare Other

## 2017-06-30 ENCOUNTER — Other Ambulatory Visit: Payer: Self-pay | Admitting: Medical Oncology

## 2017-06-30 ENCOUNTER — Other Ambulatory Visit: Payer: Self-pay | Admitting: *Deleted

## 2017-06-30 VITALS — BP 134/57 | HR 70 | Temp 97.8°F | Resp 16 | Ht 63.0 in | Wt 155.9 lb

## 2017-06-30 DIAGNOSIS — C3491 Malignant neoplasm of unspecified part of right bronchus or lung: Secondary | ICD-10-CM

## 2017-06-30 DIAGNOSIS — R197 Diarrhea, unspecified: Secondary | ICD-10-CM

## 2017-06-30 DIAGNOSIS — Z95828 Presence of other vascular implants and grafts: Secondary | ICD-10-CM

## 2017-06-30 DIAGNOSIS — C3411 Malignant neoplasm of upper lobe, right bronchus or lung: Secondary | ICD-10-CM | POA: Diagnosis not present

## 2017-06-30 LAB — COMPREHENSIVE METABOLIC PANEL
ALBUMIN: 3.5 g/dL (ref 3.5–5.0)
ALK PHOS: 63 U/L (ref 40–150)
ALT: 23 U/L (ref 0–55)
ANION GAP: 11 meq/L (ref 3–11)
AST: 18 U/L (ref 5–34)
BUN: 6.2 mg/dL — AB (ref 7.0–26.0)
CALCIUM: 8.1 mg/dL — AB (ref 8.4–10.4)
CO2: 20 mEq/L — ABNORMAL LOW (ref 22–29)
CREATININE: 0.8 mg/dL (ref 0.6–1.1)
Chloride: 113 mEq/L — ABNORMAL HIGH (ref 98–109)
EGFR: 64 mL/min/{1.73_m2} — ABNORMAL LOW (ref >90)
Glucose: 161 mg/dl — ABNORMAL HIGH (ref 70–140)
POTASSIUM: 2.8 meq/L — AB (ref 3.5–5.1)
Sodium: 144 mEq/L (ref 136–145)
Total Bilirubin: 0.46 mg/dL (ref 0.20–1.20)
Total Protein: 6.8 g/dL (ref 6.4–8.3)

## 2017-06-30 LAB — CBC WITH DIFFERENTIAL/PLATELET
BASO%: 0.6 % (ref 0.0–2.0)
BASOS ABS: 0 10*3/uL (ref 0.0–0.1)
EOS ABS: 0 10*3/uL (ref 0.0–0.5)
EOS%: 1.1 % (ref 0.0–7.0)
HEMATOCRIT: 26.6 % — AB (ref 34.8–46.6)
HEMOGLOBIN: 9 g/dL — AB (ref 11.6–15.9)
LYMPH#: 0.9 10*3/uL (ref 0.9–3.3)
LYMPH%: 48.1 % (ref 14.0–49.7)
MCH: 34.8 pg — AB (ref 25.1–34.0)
MCHC: 33.8 g/dL (ref 31.5–36.0)
MCV: 102.9 fL — ABNORMAL HIGH (ref 79.5–101.0)
MONO#: 0.4 10*3/uL (ref 0.1–0.9)
MONO%: 18.7 % — ABNORMAL HIGH (ref 0.0–14.0)
NEUT#: 0.6 10*3/uL — ABNORMAL LOW (ref 1.5–6.5)
NEUT%: 31.5 % — AB (ref 38.4–76.8)
PLATELETS: 121 10*3/uL — AB (ref 145–400)
RBC: 2.59 10*6/uL — ABNORMAL LOW (ref 3.70–5.45)
RDW: 18.1 % — AB (ref 11.2–14.5)
WBC: 2 10*3/uL — ABNORMAL LOW (ref 3.9–10.3)

## 2017-06-30 LAB — MAGNESIUM: Magnesium: 0.9 mg/dl — CL (ref 1.5–2.5)

## 2017-06-30 MED ORDER — DIPHENOXYLATE-ATROPINE 2.5-0.025 MG PO TABS: 1.0000 | ORAL_TABLET | Freq: Four times a day (QID) | ORAL | 0 refills | Status: AC | PRN

## 2017-06-30 MED ORDER — MAGNESIUM SULFATE 50 % IJ SOLN
Freq: Once | INTRAMUSCULAR | Status: AC
  Administered 2017-06-30: 12:00:00 via INTRAVENOUS
  Filled 2017-06-30: qty 1000

## 2017-06-30 MED ORDER — MAGNESIUM OXIDE 400 (241.3 MG) MG PO TABS: 400.0000 mg | ORAL_TABLET | Freq: Three times a day (TID) | ORAL | 0 refills | Status: DC

## 2017-06-30 MED ORDER — SODIUM CHLORIDE 0.9 % IV SOLN
INTRAVENOUS | Status: DC
  Administered 2017-06-30: 11:00:00 via INTRAVENOUS

## 2017-06-30 MED ORDER — LOPERAMIDE HCL 2 MG PO CAPS
ORAL_CAPSULE | ORAL | Status: AC
  Filled 2017-06-30: qty 1

## 2017-06-30 MED ORDER — HEPARIN SOD (PORK) LOCK FLUSH 100 UNIT/ML IV SOLN
500.0000 [IU] | Freq: Once | INTRAVENOUS | Status: AC
  Administered 2017-06-30: 500 [IU]
  Filled 2017-06-30: qty 5

## 2017-06-30 MED ORDER — LOPERAMIDE HCL 2 MG PO CAPS
2.0000 mg | ORAL_CAPSULE | Freq: Once | ORAL | Status: AC
  Administered 2017-06-30: 2 mg via ORAL

## 2017-06-30 MED ORDER — SODIUM CHLORIDE 0.9% FLUSH
10.0000 mL | Freq: Once | INTRAVENOUS | Status: AC
  Administered 2017-06-30: 10 mL
  Filled 2017-06-30: qty 10

## 2017-06-30 NOTE — Patient Instructions (Signed)
Hypokalemia Hypokalemia means that the amount of potassium in the blood is lower than normal.Potassium is a chemical that helps regulate the amount of fluid in the body (electrolyte). It also stimulates muscle tightening (contraction) and helps nerves work properly.Normally, most of the body's potassium is inside of cells, and only a very small amount is in the blood. Because the amount in the blood is so small, minor changes to potassium levels in the blood can be life-threatening. What are the causes? This condition may be caused by:  Antibiotic medicine.  Diarrhea or vomiting. Taking too much of a medicine that helps you have a bowel movement (laxative) can cause diarrhea and lead to hypokalemia.  Chronic kidney disease (CKD).  Medicines that help the body get rid of excess fluid (diuretics).  Eating disorders, such as bulimia.  Low magnesium levels in the body.  Sweating a lot.  What are the signs or symptoms? Symptoms of this condition include:  Weakness.  Constipation.  Fatigue.  Muscle cramps.  Mental confusion.  Skipped heartbeats or irregular heartbeat (palpitations).  Tingling or numbness.  How is this diagnosed? This condition is diagnosed with a blood test. How is this treated? Hypokalemia can be treated by taking potassium supplements by mouth or adjusting the medicines that you take. Treatment may also include eating more foods that contain a lot of potassium. If your potassium level is very low, you may need to get potassium through an IV tube in one of your veins and be monitored in the hospital. Follow these instructions at home:  Take over-the-counter and prescription medicines only as told by your health care provider. This includes vitamins and supplements.  Eat a healthy diet. A healthy diet includes fresh fruits and vegetables, whole grains, healthy fats, and lean proteins.  If instructed, eat more foods that contain a lot of potassium, such  as: ? Nuts, such as peanuts and pistachios. ? Seeds, such as sunflower seeds and pumpkin seeds. ? Peas, lentils, and lima beans. ? Whole grain and bran cereals and breads. ? Fresh fruits and vegetables, such as apricots, avocado, bananas, cantaloupe, kiwi, oranges, tomatoes, asparagus, and potatoes. ? Orange juice. ? Tomato juice. ? Red meats. ? Yogurt.  Keep all follow-up visits as told by your health care provider. This is important. Contact a health care provider if:  You have weakness that gets worse.  You feel your heart pounding or racing.  You vomit.  You have diarrhea.  You have diabetes (diabetes mellitus) and you have trouble keeping your blood sugar (glucose) in your target range. Get help right away if:  You have chest pain.  You have shortness of breath.  You have vomiting or diarrhea that lasts for more than 2 days.  You faint. This information is not intended to replace advice given to you by your health care provider. Make sure you discuss any questions you have with your health care provider. Document Released: 11/25/2005 Document Revised: 07/13/2016 Document Reviewed: 07/13/2016 Elsevier Interactive Patient Education  2018 [employer].  Hypomagnesemia Hypomagnesemia is a condition in which the level of magnesium in the blood is low. Magnesium is a mineral that is found in many foods. It is used in many different processes in the body. Hypomagnesemia can affect every organ in the body. It can cause life-threatening problems. What are the causes? Causes of hypomagnesemia include:  Not getting enough magnesium in your diet.  Malnutrition.  Problems with absorbing magnesium from the intestines.  Dehydration.  Alcohol abuse.  Vomiting.  Severe diarrhea.  Some medicines, including medicines that make you urinate more.  Certain diseases, such as kidney disease, diabetes, and overactive thyroid.  What are the signs or symptoms?  Involuntary  shaking or trembling of a body part (tremor).  Confusion.  Muscle weakness.  Sensitivity to light, sound, and touch.  Psychiatric issues, such as depression, irritability, or psychosis.  Sudden tightening of muscles (muscle spasms).  Tingling in the arms and legs.  A feeling of fluttering of the heart. These symptoms are more severe if magnesium levels drop suddenly. How is this diagnosed? To make a diagnosis, your health care provider will do a physical exam and order blood and urine tests. How is this treated? Treatment will depend on the cause and the severity of your condition. It may involve:  A magnesium supplement. This can be taken in pill form. It can also be given through an IV tube. This is usually done if the condition is severe.  Changes to your diet. You may be directed to eat foods that have a lot of magnesium, such as green leafy vegetables, peas, beans, and nuts.  Eliminating alcohol from your diet.  Follow these instructions at home:  Include foods with magnesium in your diet. Foods that are rich in magnesium include green vegetables, beans, nuts and seeds, and whole grains.  Take medicines only as directed by your health care provider.  Take magnesium supplements if your health care provider instructs you to do that. Take them as directed.  Have your magnesium levels monitored as directed by your health care provider.  When you are active, drink fluids that contain electrolytes.  Keep all follow-up visits as directed by your health care provider. This is important. Contact a health care provider if:  You get worse instead of better.  Your symptoms return. Get help right away if:  Your symptoms are severe. This information is not intended to replace advice given to you by your health care provider. Make sure you discuss any questions you have with your health care provider. Document Released: 08/21/2005 Document Revised: 05/02/2016 Document Reviewed:  07/11/2014 Elsevier Interactive Patient Education  2018 [employer].

## 2017-06-30 NOTE — Progress Notes (Signed)
Pt arrived to [facility] stating she was feeling quite weak, dizzy, legs aching-especially at night. She states the diarrhea continued throughout the weekend-4-6 x a day.Appetite is ok. Denies nausea or vomiting.  Drinking fair amount of fluids, Tongue a bit dry  Labs obtained. Found to have low magnesium and low potassium. IV fluids adjusted accordingly withKCL 40 meq and Magnesium 4 gms to infuse over 4 hours.  Pt and daughter aware of the above.

## 2017-07-02 ENCOUNTER — Other Ambulatory Visit: Payer: Medicare Other

## 2017-07-02 ENCOUNTER — Ambulatory Visit (HOSPITAL_BASED_OUTPATIENT_CLINIC_OR_DEPARTMENT_OTHER): Payer: Medicare Other

## 2017-07-02 VITALS — BP 129/66 | HR 70 | Temp 97.6°F | Resp 18 | Ht 63.0 in | Wt 160.5 lb

## 2017-07-02 DIAGNOSIS — C3411 Malignant neoplasm of upper lobe, right bronchus or lung: Secondary | ICD-10-CM | POA: Diagnosis not present

## 2017-07-02 DIAGNOSIS — E860 Dehydration: Secondary | ICD-10-CM | POA: Diagnosis not present

## 2017-07-02 DIAGNOSIS — R197 Diarrhea, unspecified: Secondary | ICD-10-CM

## 2017-07-02 DIAGNOSIS — Z95828 Presence of other vascular implants and grafts: Secondary | ICD-10-CM

## 2017-07-02 DIAGNOSIS — C3491 Malignant neoplasm of unspecified part of right bronchus or lung: Secondary | ICD-10-CM

## 2017-07-02 MED ORDER — SODIUM CHLORIDE 0.9 % IV SOLN
Freq: Once | INTRAVENOUS | Status: AC
  Administered 2017-07-02: 11:00:00 via INTRAVENOUS

## 2017-07-02 MED ORDER — SODIUM CHLORIDE 0.9 % IV SOLN
Freq: Once | INTRAVENOUS | Status: AC
  Administered 2017-07-02: 11:00:00 via INTRAVENOUS
  Filled 2017-07-02: qty 1000

## 2017-07-02 MED ORDER — HEPARIN SOD (PORK) LOCK FLUSH 100 UNIT/ML IV SOLN
500.0000 [IU] | Freq: Once | INTRAVENOUS | Status: AC
  Administered 2017-07-02: 500 [IU]
  Filled 2017-07-02: qty 5

## 2017-07-02 MED ORDER — SODIUM CHLORIDE 0.9% FLUSH
10.0000 mL | Freq: Once | INTRAVENOUS | Status: AC
  Administered 2017-07-02: 10 mL
  Filled 2017-07-02: qty 10

## 2017-07-02 NOTE — Patient Instructions (Signed)
Dehydration, Adult Dehydration is a condition in which there is not enough fluid or water in the body. This happens when you lose more fluids than you take in. Important organs, such as the kidneys, brain, and heart, cannot function without a proper amount of fluids. Any loss of fluids from the body can lead to dehydration. Dehydration can range from mild to severe. This condition should be treated right away to prevent it from becoming severe. What are the causes? This condition may be caused by:  Vomiting.  Diarrhea.  Excessive sweating, such as from heat exposure or exercise.  Not drinking enough fluid, especially: ? When ill. ? While doing activity that requires a lot of energy.  Excessive urination.  Fever.  Infection.  Certain medicines, such as medicines that cause the body to lose excess fluid (diuretics).  Inability to access safe drinking water.  Reduced physical ability to get adequate water and food.  What increases the risk? This condition is more likely to develop in people:  Who have a poorly controlled long-term (chronic) illness, such as diabetes, heart disease, or kidney disease.  Who are age 65 or older.  Who are disabled.  Who live in a place with high altitude.  Who play endurance sports.  What are the signs or symptoms? Symptoms of mild dehydration may include:  Thirst.  Dry lips.  Slightly dry mouth.  Dry, warm skin.  Dizziness. Symptoms of moderate dehydration may include:  Very dry mouth.  Muscle cramps.  Dark urine. Urine may be the color of tea.  Decreased urine production.  Decreased tear production.  Heartbeat that is irregular or faster than normal (palpitations).  Headache.  Light-headedness, especially when you stand up from a sitting position.  Fainting (syncope). Symptoms of severe dehydration may include:  Changes in skin, such as: ? Cold and clammy skin. ? Blotchy (mottled) or pale skin. ? Skin that does  not quickly return to normal after being lightly pinched and released (poor skin turgor).  Changes in body fluids, such as: ? Extreme thirst. ? No tear production. ? Inability to sweat when body temperature is high, such as in hot weather. ? Very little urine production.  Changes in vital signs, such as: ? Weak pulse. ? Pulse that is more than 100 beats a minute when sitting still. ? Rapid breathing. ? Low blood pressure.  Other changes, such as: ? Sunken eyes. ? Cold hands and feet. ? Confusion. ? Lack of energy (lethargy). ? Difficulty waking up from sleep. ? Short-term weight loss. ? Unconsciousness. How is this diagnosed? This condition is diagnosed based on your symptoms and a physical exam. Blood and urine tests may be done to help confirm the diagnosis. How is this treated? Treatment for this condition depends on the severity. Mild or moderate dehydration can often be treated at home. Treatment should be started right away. Do not wait until dehydration becomes severe. Severe dehydration is an emergency and it needs to be treated in a hospital. Treatment for mild dehydration may include:  Drinking more fluids.  Replacing salts and minerals in your blood (electrolytes) that you may have lost. Treatment for moderate dehydration may include:  Drinking an oral rehydration solution (ORS). This is a drink that helps you replace fluids and electrolytes (rehydrate). It can be found at pharmacies and retail stores. Treatment for severe dehydration may include:  Receiving fluids through an IV tube.  Receiving an electrolyte solution through a feeding tube that is passed through your nose   and into your stomach (nasogastric tube, or NG tube).  Correcting any abnormalities in electrolytes.  Treating the underlying cause of dehydration. Follow these instructions at home:  If directed by your health care provider, drink an ORS: ? Make an ORS by following instructions on the  package. ? Start by drinking small amounts, about  cup (120 mL) every 5-10 minutes. ? Slowly increase how much you drink until you have taken the amount recommended by your health care provider.  Drink enough clear fluid to keep your urine clear or pale yellow. If you were told to drink an ORS, finish the ORS first, then start slowly drinking other clear fluids. Drink fluids such as: ? Water. Do not drink only water. Doing that can lead to having too little salt (sodium) in the body (hyponatremia). ? Ice chips. ? Fruit juice that you have added water to (diluted fruit juice). ? Low-calorie sports drinks.  Avoid: ? Alcohol. ? Drinks that contain a lot of sugar. These include high-calorie sports drinks, fruit juice that is not diluted, and soda. ? Caffeine. ? Foods that are greasy or contain a lot of fat or sugar.  Take over-the-counter and prescription medicines only as told by your health care provider.  Do not take sodium tablets. This can lead to having too much sodium in the body (hypernatremia).  Eat foods that contain a healthy balance of electrolytes, such as bananas, oranges, potatoes, tomatoes, and spinach.  Keep all follow-up visits as told by your health care provider. This is important. Contact a health care provider if:  You have abdominal pain that: ? Gets worse. ? Stays in one area (localizes).  You have a rash.  You have a stiff neck.  You are more irritable than usual.  You are sleepier or more difficult to wake up than usual.  You feel weak or dizzy.  You feel very thirsty.  You have urinated only a small amount of very dark urine over 6-8 hours. Get help right away if:  You have symptoms of severe dehydration.  You cannot drink fluids without vomiting.  Your symptoms get worse with treatment.  You have a fever.  You have a severe headache.  You have vomiting or diarrhea that: ? Gets worse. ? Does not go away.  You have blood or green matter  (bile) in your vomit.  You have blood in your stool. This may cause stool to look black and tarry.  You have not urinated in 6-8 hours.  You faint.  Your heart rate while sitting still is over 100 beats a minute.  You have trouble breathing. This information is not intended to replace advice given to you by your health care provider. Make sure you discuss any questions you have with your health care provider. Document Released: 11/25/2005 Document Revised: 06/21/2016 Document Reviewed: 01/19/2016 Elsevier Interactive Patient Education  2018 [employer].    Hypokalemia Hypokalemia means that the amount of potassium in the blood is lower than normal.Potassium is a chemical that helps regulate the amount of fluid in the body (electrolyte). It also stimulates muscle tightening (contraction) and helps nerves work properly.Normally, most of the body's potassium is inside of cells, and only a very small amount is in the blood. Because the amount in the blood is so small, minor changes to potassium levels in the blood can be life-threatening. What are the causes? This condition may be caused by:  Antibiotic medicine.  Diarrhea or vomiting. Taking too much of a medicine  that helps you have a bowel movement (laxative) can cause diarrhea and lead to hypokalemia.  Chronic kidney disease (CKD).  Medicines that help the body get rid of excess fluid (diuretics).  Eating disorders, such as bulimia.  Low magnesium levels in the body.  Sweating a lot.  What are the signs or symptoms? Symptoms of this condition include:  Weakness.  Constipation.  Fatigue.  Muscle cramps.  Mental confusion.  Skipped heartbeats or irregular heartbeat (palpitations).  Tingling or numbness.  How is this diagnosed? This condition is diagnosed with a blood test. How is this treated? Hypokalemia can be treated by taking potassium supplements by mouth or adjusting the medicines that you take.  Treatment may also include eating more foods that contain a lot of potassium. If your potassium level is very low, you may need to get potassium through an IV tube in one of your veins and be monitored in the hospital. Follow these instructions at home:  Take over-the-counter and prescription medicines only as told by your health care provider. This includes vitamins and supplements.  Eat a healthy diet. A healthy diet includes fresh fruits and vegetables, whole grains, healthy fats, and lean proteins.  If instructed, eat more foods that contain a lot of potassium, such as: ? Nuts, such as peanuts and pistachios. ? Seeds, such as sunflower seeds and pumpkin seeds. ? Peas, lentils, and lima beans. ? Whole grain and bran cereals and breads. ? Fresh fruits and vegetables, such as apricots, avocado, bananas, cantaloupe, kiwi, oranges, tomatoes, asparagus, and potatoes. ? Orange juice. ? Tomato juice. ? Red meats. ? Yogurt.  Keep all follow-up visits as told by your health care provider. This is important. Contact a health care provider if:  You have weakness that gets worse.  You feel your heart pounding or racing.  You vomit.  You have diarrhea.  You have diabetes (diabetes mellitus) and you have trouble keeping your blood sugar (glucose) in your target range. Get help right away if:  You have chest pain.  You have shortness of breath.  You have vomiting or diarrhea that lasts for more than 2 days.  You faint. This information is not intended to replace advice given to you by your health care provider. Make sure you discuss any questions you have with your health care provider. Document Released: 11/25/2005 Document Revised: 07/13/2016 Document Reviewed: 07/13/2016 Elsevier Interactive Patient Education  2018 [employer].

## 2017-07-04 ENCOUNTER — Ambulatory Visit (HOSPITAL_BASED_OUTPATIENT_CLINIC_OR_DEPARTMENT_OTHER): Payer: Medicare Other

## 2017-07-04 ENCOUNTER — Other Ambulatory Visit: Payer: Self-pay | Admitting: Medical Oncology

## 2017-07-04 VITALS — BP 130/54 | HR 70 | Temp 98.0°F | Resp 19 | Ht 63.0 in | Wt 160.6 lb

## 2017-07-04 DIAGNOSIS — C3491 Malignant neoplasm of unspecified part of right bronchus or lung: Secondary | ICD-10-CM

## 2017-07-04 DIAGNOSIS — R197 Diarrhea, unspecified: Secondary | ICD-10-CM

## 2017-07-04 DIAGNOSIS — Z95828 Presence of other vascular implants and grafts: Secondary | ICD-10-CM

## 2017-07-04 DIAGNOSIS — C3411 Malignant neoplasm of upper lobe, right bronchus or lung: Secondary | ICD-10-CM

## 2017-07-04 DIAGNOSIS — E860 Dehydration: Secondary | ICD-10-CM

## 2017-07-04 MED ORDER — SODIUM CHLORIDE 0.9 % IV SOLN
INTRAVENOUS | Status: DC
  Administered 2017-07-04: 11:00:00 via INTRAVENOUS

## 2017-07-04 MED ORDER — HEPARIN SOD (PORK) LOCK FLUSH 100 UNIT/ML IV SOLN
500.0000 [IU] | Freq: Once | INTRAVENOUS | Status: AC
  Administered 2017-07-04: 500 [IU]
  Filled 2017-07-04: qty 5

## 2017-07-04 MED ORDER — SODIUM CHLORIDE 0.9% FLUSH
10.0000 mL | Freq: Once | INTRAVENOUS | Status: AC
  Administered 2017-07-04: 10 mL
  Filled 2017-07-04: qty 10

## 2017-07-04 MED ORDER — SODIUM CHLORIDE 0.9 % IV SOLN
Freq: Once | INTRAVENOUS | Status: AC
  Administered 2017-07-04: 12:00:00 via INTRAVENOUS
  Filled 2017-07-04: qty 1000

## 2017-07-04 NOTE — Patient Instructions (Signed)
Dehydration, Adult Dehydration is a condition in which there is not enough fluid or water in the body. This happens when you lose more fluids than you take in. Important organs, such as the kidneys, brain, and heart, cannot function without a proper amount of fluids. Any loss of fluids from the body can lead to dehydration. Dehydration can range from mild to severe. This condition should be treated right away to prevent it from becoming severe. What are the causes? This condition may be caused by:  Vomiting.  Diarrhea.  Excessive sweating, such as from heat exposure or exercise.  Not drinking enough fluid, especially: ? When ill. ? While doing activity that requires a lot of energy.  Excessive urination.  Fever.  Infection.  Certain medicines, such as medicines that cause the body to lose excess fluid (diuretics).  Inability to access safe drinking water.  Reduced physical ability to get adequate water and food.  What increases the risk? This condition is more likely to develop in people:  Who have a poorly controlled long-term (chronic) illness, such as diabetes, heart disease, or kidney disease.  Who are age 65 or older.  Who are disabled.  Who live in a place with high altitude.  Who play endurance sports.  What are the signs or symptoms? Symptoms of mild dehydration may include:  Thirst.  Dry lips.  Slightly dry mouth.  Dry, warm skin.  Dizziness. Symptoms of moderate dehydration may include:  Very dry mouth.  Muscle cramps.  Dark urine. Urine may be the color of tea.  Decreased urine production.  Decreased tear production.  Heartbeat that is irregular or faster than normal (palpitations).  Headache.  Light-headedness, especially when you stand up from a sitting position.  Fainting (syncope). Symptoms of severe dehydration may include:  Changes in skin, such as: ? Cold and clammy skin. ? Blotchy (mottled) or pale skin. ? Skin that does  not quickly return to normal after being lightly pinched and released (poor skin turgor).  Changes in body fluids, such as: ? Extreme thirst. ? No tear production. ? Inability to sweat when body temperature is high, such as in hot weather. ? Very little urine production.  Changes in vital signs, such as: ? Weak pulse. ? Pulse that is more than 100 beats a minute when sitting still. ? Rapid breathing. ? Low blood pressure.  Other changes, such as: ? Sunken eyes. ? Cold hands and feet. ? Confusion. ? Lack of energy (lethargy). ? Difficulty waking up from sleep. ? Short-term weight loss. ? Unconsciousness. How is this diagnosed? This condition is diagnosed based on your symptoms and a physical exam. Blood and urine tests may be done to help confirm the diagnosis. How is this treated? Treatment for this condition depends on the severity. Mild or moderate dehydration can often be treated at home. Treatment should be started right away. Do not wait until dehydration becomes severe. Severe dehydration is an emergency and it needs to be treated in a hospital. Treatment for mild dehydration may include:  Drinking more fluids.  Replacing salts and minerals in your blood (electrolytes) that you may have lost. Treatment for moderate dehydration may include:  Drinking an oral rehydration solution (ORS). This is a drink that helps you replace fluids and electrolytes (rehydrate). It can be found at pharmacies and retail stores. Treatment for severe dehydration may include:  Receiving fluids through an IV tube.  Receiving an electrolyte solution through a feeding tube that is passed through your nose   and into your stomach (nasogastric tube, or NG tube).  Correcting any abnormalities in electrolytes.  Treating the underlying cause of dehydration. Follow these instructions at home:  If directed by your health care provider, drink an ORS: ? Make an ORS by following instructions on the  package. ? Start by drinking small amounts, about  cup (120 mL) every 5-10 minutes. ? Slowly increase how much you drink until you have taken the amount recommended by your health care provider.  Drink enough clear fluid to keep your urine clear or pale yellow. If you were told to drink an ORS, finish the ORS first, then start slowly drinking other clear fluids. Drink fluids such as: ? Water. Do not drink only water. Doing that can lead to having too little salt (sodium) in the body (hyponatremia). ? Ice chips. ? Fruit juice that you have added water to (diluted fruit juice). ? Low-calorie sports drinks.  Avoid: ? Alcohol. ? Drinks that contain a lot of sugar. These include high-calorie sports drinks, fruit juice that is not diluted, and soda. ? Caffeine. ? Foods that are greasy or contain a lot of fat or sugar.  Take over-the-counter and prescription medicines only as told by your health care provider.  Do not take sodium tablets. This can lead to having too much sodium in the body (hypernatremia).  Eat foods that contain a healthy balance of electrolytes, such as bananas, oranges, potatoes, tomatoes, and spinach.  Keep all follow-up visits as told by your health care provider. This is important. Contact a health care provider if:  You have abdominal pain that: ? Gets worse. ? Stays in one area (localizes).  You have a rash.  You have a stiff neck.  You are more irritable than usual.  You are sleepier or more difficult to wake up than usual.  You feel weak or dizzy.  You feel very thirsty.  You have urinated only a small amount of very dark urine over 6-8 hours. Get help right away if:  You have symptoms of severe dehydration.  You cannot drink fluids without vomiting.  Your symptoms get worse with treatment.  You have a fever.  You have a severe headache.  You have vomiting or diarrhea that: ? Gets worse. ? Does not go away.  You have blood or green matter  (bile) in your vomit.  You have blood in your stool. This may cause stool to look black and tarry.  You have not urinated in 6-8 hours.  You faint.  Your heart rate while sitting still is over 100 beats a minute.  You have trouble breathing. This information is not intended to replace advice given to you by your health care provider. Make sure you discuss any questions you have with your health care provider. Document Released: 11/25/2005 Document Revised: 06/21/2016 Document Reviewed: 01/19/2016 Elsevier Interactive Patient Education  2018 Elsevier Inc.  

## 2017-07-04 NOTE — Progress Notes (Signed)
Pt still feeling very fatigued, gets short of breath with most any activity. States her peripheral neuropathy is keeping her awake at night. Spoke with Arnoldo Lenis., Dr. Worthy Flank nurse. And agreed that we should  move up her lab appt to Monday, 07/07/17 and include type and hold as pt's last HGB was only 9.0 Time for lab appt is 11am on 07/07/17   Pt and daughter aware of appt on Monday.

## 2017-07-07 ENCOUNTER — Other Ambulatory Visit: Payer: Self-pay | Admitting: *Deleted

## 2017-07-07 ENCOUNTER — Ambulatory Visit (HOSPITAL_BASED_OUTPATIENT_CLINIC_OR_DEPARTMENT_OTHER): Payer: Medicare Other

## 2017-07-07 ENCOUNTER — Other Ambulatory Visit (HOSPITAL_BASED_OUTPATIENT_CLINIC_OR_DEPARTMENT_OTHER): Payer: Medicare Other

## 2017-07-07 ENCOUNTER — Ambulatory Visit: Payer: Medicare Other

## 2017-07-07 DIAGNOSIS — E860 Dehydration: Secondary | ICD-10-CM | POA: Diagnosis not present

## 2017-07-07 DIAGNOSIS — R300 Dysuria: Secondary | ICD-10-CM

## 2017-07-07 DIAGNOSIS — Z452 Encounter for adjustment and management of vascular access device: Secondary | ICD-10-CM | POA: Diagnosis not present

## 2017-07-07 DIAGNOSIS — C3411 Malignant neoplasm of upper lobe, right bronchus or lung: Secondary | ICD-10-CM

## 2017-07-07 DIAGNOSIS — C3491 Malignant neoplasm of unspecified part of right bronchus or lung: Secondary | ICD-10-CM

## 2017-07-07 DIAGNOSIS — Z95828 Presence of other vascular implants and grafts: Secondary | ICD-10-CM

## 2017-07-07 LAB — CBC WITH DIFFERENTIAL/PLATELET
BASO%: 0.7 % (ref 0.0–2.0)
BASOS ABS: 0 10*3/uL (ref 0.0–0.1)
EOS ABS: 0 10*3/uL (ref 0.0–0.5)
EOS%: 0.3 % (ref 0.0–7.0)
HCT: 27.9 % — ABNORMAL LOW (ref 34.8–46.6)
HEMOGLOBIN: 9 g/dL — AB (ref 11.6–15.9)
LYMPH%: 28.8 % (ref 14.0–49.7)
MCH: 34.7 pg — AB (ref 25.1–34.0)
MCHC: 32.3 g/dL (ref 31.5–36.0)
MCV: 107.7 fL — AB (ref 79.5–101.0)
MONO#: 0.4 10*3/uL (ref 0.1–0.9)
MONO%: 14.6 % — AB (ref 0.0–14.0)
NEUT#: 1.6 10*3/uL (ref 1.5–6.5)
NEUT%: 55.6 % (ref 38.4–76.8)
Platelets: 141 10*3/uL — ABNORMAL LOW (ref 145–400)
RBC: 2.59 10*6/uL — ABNORMAL LOW (ref 3.70–5.45)
RDW: 18.1 % — AB (ref 11.2–14.5)
WBC: 3 10*3/uL — ABNORMAL LOW (ref 3.9–10.3)
lymph#: 0.9 10*3/uL (ref 0.9–3.3)

## 2017-07-07 LAB — COMPREHENSIVE METABOLIC PANEL
ALBUMIN: 3.4 g/dL — AB (ref 3.5–5.0)
ALK PHOS: 64 U/L (ref 40–150)
ALT: 15 U/L (ref 0–55)
AST: 15 U/L (ref 5–34)
Anion Gap: 12 mEq/L — ABNORMAL HIGH (ref 3–11)
BUN: 10.4 mg/dL (ref 7.0–26.0)
CALCIUM: 9.5 mg/dL (ref 8.4–10.4)
CHLORIDE: 105 meq/L (ref 98–109)
CO2: 27 mEq/L (ref 22–29)
Creatinine: 0.8 mg/dL (ref 0.6–1.1)
EGFR: 63 mL/min/{1.73_m2} — AB (ref >90)
Glucose: 232 mg/dl — ABNORMAL HIGH (ref 70–140)
POTASSIUM: 3.7 meq/L (ref 3.5–5.1)
Sodium: 143 mEq/L (ref 136–145)
Total Bilirubin: 0.35 mg/dL (ref 0.20–1.20)
Total Protein: 6.8 g/dL (ref 6.4–8.3)

## 2017-07-07 LAB — URINALYSIS, MICROSCOPIC - CHCC
BILIRUBIN (URINE): NEGATIVE
BLOOD: NEGATIVE
Glucose: NEGATIVE mg/dL
Ketones: NEGATIVE mg/dL
Nitrite: NEGATIVE
PH: 6.5 (ref 4.6–8.0)
Protein: NEGATIVE mg/dL
RBC / HPF: NEGATIVE (ref 0–2)
SPECIFIC GRAVITY, URINE: 1.01 (ref 1.003–1.035)
Urobilinogen, UR: 0.2 mg/dL (ref 0.2–1)

## 2017-07-07 MED ORDER — SODIUM CHLORIDE 0.9% FLUSH
10.0000 mL | Freq: Once | INTRAVENOUS | Status: AC
  Administered 2017-07-07: 10 mL
  Filled 2017-07-07: qty 10

## 2017-07-07 MED ORDER — HEPARIN SOD (PORK) LOCK FLUSH 100 UNIT/ML IV SOLN
500.0000 [IU] | Freq: Once | INTRAVENOUS | Status: DC
  Filled 2017-07-07: qty 5

## 2017-07-07 MED ORDER — HEPARIN SOD (PORK) LOCK FLUSH 100 UNIT/ML IV SOLN
500.0000 [IU] | Freq: Once | INTRAVENOUS | Status: AC
  Administered 2017-07-07: 500 [IU]
  Filled 2017-07-07: qty 5

## 2017-07-07 NOTE — Patient Instructions (Signed)

## 2017-07-07 NOTE — Progress Notes (Signed)
Reviewed labs with Maria Washington.  No need for IV fluids today. Maria Washington states she is doing better over all. Diarrhea has all but stopped. She is eating and drinking fairly well. Still not a lot of energy. HGB remains at9  Maria Washington tried 2 gabapentin @ night and this has helped her legs be less painful at night. He rhands remain very cold and tingly. She is asking if she can take 2 gabapentin in the morning. Will forward this question on to Dr. Julien Nordmann. Maria Washington is also asking if she can see Dr. Julien Nordmann before the 22nd regarding hand pains and fatigue and general f/u.  Maria Washington also c/o of dysuria and fould odor to urine. Order placed for u/a and culture.  Maria Washington to get specimen to lab before she leaves today.  Port de-accessed.

## 2017-07-09 ENCOUNTER — Ambulatory Visit: Payer: Medicare Other | Admitting: Internal Medicine

## 2017-07-09 ENCOUNTER — Telehealth: Payer: Self-pay | Admitting: *Deleted

## 2017-07-09 ENCOUNTER — Telehealth: Payer: Self-pay | Admitting: Medical Oncology

## 2017-07-09 ENCOUNTER — Other Ambulatory Visit: Payer: Medicare Other

## 2017-07-09 ENCOUNTER — Ambulatory Visit: Payer: Medicare Other

## 2017-07-09 ENCOUNTER — Other Ambulatory Visit: Payer: Self-pay | Admitting: *Deleted

## 2017-07-09 ENCOUNTER — Other Ambulatory Visit: Payer: Self-pay | Admitting: Medical Oncology

## 2017-07-09 DIAGNOSIS — N390 Urinary tract infection, site not specified: Secondary | ICD-10-CM

## 2017-07-09 DIAGNOSIS — G609 Hereditary and idiopathic neuropathy, unspecified: Secondary | ICD-10-CM

## 2017-07-09 DIAGNOSIS — C3491 Malignant neoplasm of unspecified part of right bronchus or lung: Secondary | ICD-10-CM

## 2017-07-09 LAB — URINE CULTURE

## 2017-07-09 MED ORDER — OXYCODONE-ACETAMINOPHEN 5-325 MG PO TABS: 1.0000 | ORAL_TABLET | Freq: Four times a day (QID) | ORAL | 0 refills | Status: DC | PRN

## 2017-07-09 MED ORDER — CIPROFLOXACIN HCL 500 MG PO TABS: 500.0000 mg | ORAL_TABLET | Freq: Two times a day (BID) | ORAL | 0 refills | Status: DC

## 2017-07-09 MED ORDER — GABAPENTIN 100 MG PO CAPS: 200.0000 mg | ORAL_CAPSULE | Freq: Two times a day (BID) | ORAL | 2 refills | Status: DC

## 2017-07-09 NOTE — Telephone Encounter (Signed)
TCT patient to inform her that Dr. Julien Nordmann ok's her gabapentin to be increased to 2 capsules in the morning and and 2 at night. Adjusted refill escribed to [facility] on [address]. Pt also asked about u/a results. Copy of report given to Dr. Julien Nordmann. Advised pt that we would call her if results of u/a required antibiotics..  Pt voiced understanding to all of the above.

## 2017-07-09 NOTE — Telephone Encounter (Signed)
Returned call. Daughter reports pt cannot get pain relief in her legs from neurontin. Per [facility] rx ordered.  Daughter notified.

## 2017-07-09 NOTE — Telephone Encounter (Signed)
Reviewed U/a results with Dr. Julien Nordmann. Received order for Cipro 500mg  1 BID for 5 days. This was escribed to [facility] on [address].   TCT patient's home. Spoke with daughter, Maria Washington and advised of the above. She voiced understanding and will pick up antibiotic prescription as well as gabapentin today.   She voices understanding to call with any questions or concerns.

## 2017-07-12 ENCOUNTER — Encounter (HOSPITAL_COMMUNITY): Payer: Self-pay | Admitting: Emergency Medicine

## 2017-07-12 ENCOUNTER — Emergency Department (HOSPITAL_COMMUNITY)
Admission: EM | Admit: 2017-07-12 | Discharge: 2017-07-13 | Disposition: A | Discharge status: Home / Self Care | Payer: Medicare Other | Attending: Emergency Medicine | Admitting: Emergency Medicine

## 2017-07-12 ENCOUNTER — Other Ambulatory Visit: Payer: Self-pay

## 2017-07-12 DIAGNOSIS — J449 Chronic obstructive pulmonary disease, unspecified: Secondary | ICD-10-CM | POA: Insufficient documentation

## 2017-07-12 DIAGNOSIS — R531 Weakness: Secondary | ICD-10-CM

## 2017-07-12 DIAGNOSIS — I110 Hypertensive heart disease with heart failure: Secondary | ICD-10-CM | POA: Insufficient documentation

## 2017-07-12 DIAGNOSIS — Z7901 Long term (current) use of anticoagulants: Secondary | ICD-10-CM | POA: Insufficient documentation

## 2017-07-12 DIAGNOSIS — T451X5A Adverse effect of antineoplastic and immunosuppressive drugs, initial encounter: Secondary | ICD-10-CM

## 2017-07-12 DIAGNOSIS — Z950 Presence of cardiac pacemaker: Secondary | ICD-10-CM | POA: Insufficient documentation

## 2017-07-12 DIAGNOSIS — I2510 Atherosclerotic heart disease of native coronary artery without angina pectoris: Secondary | ICD-10-CM | POA: Diagnosis not present

## 2017-07-12 DIAGNOSIS — E039 Hypothyroidism, unspecified: Secondary | ICD-10-CM | POA: Insufficient documentation

## 2017-07-12 DIAGNOSIS — G620 Drug-induced polyneuropathy: Secondary | ICD-10-CM | POA: Insufficient documentation

## 2017-07-12 DIAGNOSIS — C3490 Malignant neoplasm of unspecified part of unspecified bronchus or lung: Secondary | ICD-10-CM | POA: Diagnosis not present

## 2017-07-12 DIAGNOSIS — I5032 Chronic diastolic (congestive) heart failure: Secondary | ICD-10-CM | POA: Diagnosis not present

## 2017-07-12 DIAGNOSIS — E860 Dehydration: Secondary | ICD-10-CM | POA: Diagnosis not present

## 2017-07-12 DIAGNOSIS — Z79899 Other long term (current) drug therapy: Secondary | ICD-10-CM | POA: Insufficient documentation

## 2017-07-12 DIAGNOSIS — T451X1A Poisoning by antineoplastic and immunosuppressive drugs, accidental (unintentional), initial encounter: Secondary | ICD-10-CM | POA: Insufficient documentation

## 2017-07-12 DIAGNOSIS — Z87891 Personal history of nicotine dependence: Secondary | ICD-10-CM | POA: Insufficient documentation

## 2017-07-12 LAB — CBC
HCT: 27.1 % — ABNORMAL LOW (ref 36.0–46.0)
Hemoglobin: 9.1 g/dL — ABNORMAL LOW (ref 12.0–15.0)
MCH: 35.1 pg — ABNORMAL HIGH (ref 26.0–34.0)
MCHC: 33.6 g/dL (ref 30.0–36.0)
MCV: 104.6 fL — ABNORMAL HIGH (ref 78.0–100.0)
Platelets: 133 10*3/uL — ABNORMAL LOW (ref 150–400)
RBC: 2.59 MIL/uL — ABNORMAL LOW (ref 3.87–5.11)
RDW: 17.6 % — AB (ref 11.5–15.5)
WBC: 4.3 10*3/uL (ref 4.0–10.5)

## 2017-07-12 LAB — BASIC METABOLIC PANEL
Anion gap: 8 (ref 5–15)
BUN: 18 mg/dL (ref 6–20)
CALCIUM: 8.8 mg/dL — AB (ref 8.9–10.3)
CO2: 25 mmol/L (ref 22–32)
Chloride: 108 mmol/L (ref 101–111)
Creatinine, Ser: 0.85 mg/dL (ref 0.44–1.00)
GFR calc Af Amer: >60 mL/min (ref >60)
GLUCOSE: 123 mg/dL — AB (ref 65–99)
Potassium: 4.2 mmol/L (ref 3.5–5.1)
SODIUM: 141 mmol/L (ref 135–145)

## 2017-07-12 LAB — URINALYSIS, ROUTINE W REFLEX MICROSCOPIC
BILIRUBIN URINE: NEGATIVE
Glucose, UA: NEGATIVE mg/dL
HGB URINE DIPSTICK: NEGATIVE
KETONES UR: NEGATIVE mg/dL
Leukocytes, UA: NEGATIVE
NITRITE: NEGATIVE
Protein, ur: NEGATIVE mg/dL
Specific Gravity, Urine: 1.015 (ref 1.005–1.030)
pH: 6 (ref 5.0–8.0)

## 2017-07-12 LAB — I-STAT CG4 LACTIC ACID, ED: Lactic Acid, Venous: 1.97 mmol/L — ABNORMAL HIGH (ref 0.5–1.9)

## 2017-07-12 MED ORDER — SODIUM CHLORIDE 0.9 % IV BOLUS (SEPSIS)
1000.0000 mL | Freq: Once | INTRAVENOUS | Status: AC
  Administered 2017-07-13: 1000 mL via INTRAVENOUS

## 2017-07-12 MED ORDER — SODIUM CHLORIDE 0.9 % IV BOLUS (SEPSIS)
1000.0000 mL | Freq: Once | INTRAVENOUS | Status: AC
  Administered 2017-07-12: 1000 mL via INTRAVENOUS

## 2017-07-12 NOTE — ED Provider Notes (Signed)
[facility] DEPT Provider Note   CSN: [id number] Arrival date & time: 07/12/17  2116  Time seen 23:04 PM    History   Chief Complaint Chief Complaint  Patient presents with  . Weakness    HPI Maria Washington is a 83 y.o. female.  HPI  patient has stage IV non-small cell lung cancer, squamous cell carcinoma diagnosed in March 2018. She is followed by Dr. Julien Nordmann. She states she got her last chemotherapy on July 11. She started getting peripheral neuropathy from the chemotherapy since about April. She states she feels "weak and woozy for a few days". She states all she does is laying around and sleep. She states her neuropathy is still bothering her a lot. She has had her gabapentin increased and most recently Dr. Julien Nordmann added Percocet. She states her legs are so weak she is unable to walk on her own without assistance. Normally she is able to walk independently. She reports she's had a normal appetite however her family shakes her head no. She denies cough, sore throat, sneezing, nausea, vomiting, fever, they do report chills. She states she has dyspnea on exertion which is chronic but she feels like it's getting worse. She denies chest pain, abdominal pain, nausea, vomiting. She does complain of chronic swelling of her legs which she feels like is getting worse. She also has been having diarrhea for quite some while and states she's taking Imodium right ear. She states she's having small amounts of loose diarrhea about 7 times a day. She denies any headache. She states she has been treated for something and felt this way before when she had dehydration. She also states last time she had a low potassium and magnesium.  PCP.pcp Oncology Dr Julien Nordmann  Past Medical History:  Diagnosis Date  . Aortic atherosclerosis ([place]) 02/10/2017  . Arthritis   . CAD (coronary artery disease), native coronary artery    3 vessel calcification noted on CT scan   . Cardiac pacemaker in situ 12/05/2011  .  Chronic diastolic congestive heart failure ([place])   . COPD (chronic obstructive pulmonary disease) (H[facility])    spot on lung  . Dehydration 06/04/2017  . Encounter for antineoplastic chemotherapy 03/04/2017  . Goals of care, counseling/discussion 03/17/2017  . History of breast cancer    Treated with lumpectomy, radiation, and chemo sees Dr. Ralene Ok   . Hypertensive heart disease without CHF   . Hypokalemia 04/18/2017  . Hypothyroid   . Mitral regurgitation   . Paroxysmal atrial fibrillation (H[facility])    CHA2DS2VASC score 5  . Second degree heart block   . Stage IV squamous cell carcinoma of right lung ([place]) 03/04/2017    Patient Active Problem List   Diagnosis Date Noted  . Hypomagnesemia 06/30/2017    Class: Acute  . Peripheral neuropathy 06/18/2017  . Port catheter in place 04/07/2017  . Goals of care, counseling/discussion 03/17/2017  . Stage IV squamous cell carcinoma of right lung ([place]) 03/04/2017  . Encounter for antineoplastic chemotherapy 03/04/2017  . Lung mass 02/10/2017  . Aortic atherosclerosis ([place]) 02/10/2017  . Obesity (BMI 30.0-34.9) 02/10/2017  . Long term current use of anticoagulant therapy   . Mitral regurgitation   . CAD (coronary artery disease), native coronary artery   . Chronic diastolic congestive heart failure ([place])   . Paroxysmal atrial fibrillation (HCC)   . Cardiac pacemaker in situ 12/04/2011  . COPD (chronic obstructive pulmonary disease) ([place]) 12/02/2011  . History of breast cancer   . Hypertensive heart  disease without CHF   . Hypothyroidism     Past Surgical History:  Procedure Laterality Date  . ABDOMINAL HYSTERECTOMY    . BLADDER SUSPENSION    . BREAST LUMPECTOMY     lumpectomy  . CARPAL TUNNEL RELEASE     left wrist  . CHOLECYSTECTOMY    . FINGER SURGERY    . IR FLUORO GUIDE PORT INSERTION RIGHT  03/12/2017  . IR US GUIDE VASC ACCESS RIGHT  03/12/2017  . PERMANENT PACEMAKER INSERTION N/A 12/04/2011   Procedure: PERMANENT PACEMAKER INSERTION;   Surgeon: Evans Lance, MD;  Location: [facility] CATH LAB;  Service: Cardiovascular;  Laterality: N/A;  . SHOULDER SURGERY    . TONSILLECTOMY AND ADENOIDECTOMY      OB History    No data available       Home Medications    Prior to Admission medications   Medication Sig Start Date End Date Taking? Authorizing Provider  alendronate (FOSAMAX) 70 MG tablet Take 70 mg by mouth every Monday.    Yes [provider]  apixaban (ELIQUIS) 5 MG TABS tablet Take 1 tablet (5 mg total) by mouth 2 (two) times daily. 05/03/14  Yes Dellinger, Bobby Rumpf, PA-C  Biotin 5000 MCG CAPS Take 5,000 mcg by mouth daily.   Yes [provider]  calcium-vitamin D (OSCAL WITH D) 500-200 MG-UNIT per tablet Take 1 tablet by mouth 2 (two) times daily.     Yes [provider]  cholecalciferol (VITAMIN D) 1000 units tablet Take 1,000 Units by mouth daily.    Yes [provider]  ciprofloxacin (CIPRO) 500 MG tablet Take 1 tablet (500 mg total) by mouth 2 (two) times daily. 07/09/17  Yes Curt Bears, MD  diltiazem (CARDIZEM CD) 120 MG 24 hr capsule Take 120 mg by mouth daily.   Yes [provider]  diphenoxylate-atropine (LOMOTIL) 2.5-0.025 MG tablet Take 1 tablet by mouth 4 (four) times daily as needed for diarrhea or loose stools. May take qid if imodium is not working. Patient taking differently: Take 1 tablet by mouth 4 (four) times daily as needed for diarrhea or loose stools.  06/30/17  Yes Curt Bears, MD  folic acid (FOLVITE) 1 MG tablet Take 1 mg by mouth daily.     Yes [provider]  furosemide (LASIX) 40 MG tablet Take 40 mg by mouth daily.    Yes [provider]  gabapentin (NEURONTIN) 100 MG capsule Take 2 capsules (200 mg total) by mouth 2 (two) times daily. 07/09/17  Yes Curt Bears, MD  levothyroxine (SYNTHROID, LEVOTHROID) 137 MCG tablet Take 137 mcg by mouth daily before breakfast.   Yes [provider]  lidocaine-prilocaine  (EMLA) cream Apply 1 application topically as needed. Patient taking differently: Apply 1 application topically once as needed (prior to port access).  03/04/17  Yes Curt Bears, MD  loperamide (IMODIUM) 2 MG capsule Take 2-4 mg by mouth 4 (four) times daily as needed for diarrhea or loose stools.    Yes [provider]  magnesium oxide (MAG-OX) 400 (241.3 Mg) MG tablet Take 1 tablet (400 mg total) by mouth 3 (three) times daily. 06/30/17  Yes Curt Bears, MD  Omega-3 Fatty Acids (FISH OIL) 1200 MG CAPS Take 1,200 mg by mouth 2 (two) times daily.    Yes [provider]  oxyCODONE-acetaminophen (PERCOCET/ROXICET) 5-325 MG tablet Take 1 tablet by mouth every 6 (six) hours as needed for severe pain. 07/09/17  Yes Curt Bears, MD  potassium chloride  SA (K-DUR,KLOR-CON) 20 MEQ tablet Take 40 mEq by mouth 2 (two) times daily.    Yes [provider]  prochlorperazine (COMPAZINE) 10 MG tablet Take 1 tablet (10 mg total) by mouth every 6 (six) hours as needed for nausea or vomiting. 03/04/17  Yes Curt Bears, MD  vitamin B-12 (CYANOCOBALAMIN) 1000 MCG tablet Take 1,000 mcg by mouth daily.     Yes [provider]    Family History Family History  Problem Relation Age of Onset  . Cancer Father   . Heart attack Mother   . Cancer Brother     Social History Social History  Substance Use Topics  . Smoking status: Former Smoker    Packs/day: 1.00    Years: 60.00    Types: Cigarettes    Quit date: 06/07/2013  . Smokeless tobacco: Never Used  . Alcohol use No  lives at home Lives with daughter No home oxygen   Allergies   Clindamycin/lincomycin and Penicillins   Review of Systems Review of Systems  All other systems reviewed and are negative.    Physical Exam Updated Vital Signs BP 122/80 (BP Location: Right Arm)   Pulse 80   Temp 98 F (36.7 C) (Oral)   Resp 15   Ht 5' (1.524 m)   Wt 68.9 kg (152 lb)   SpO2 97%   BMI 29.69 kg/m     Vital signs normal    Physical Exam  Constitutional: She is oriented to person, place, and time. She appears well-developed and well-nourished.  Non-toxic appearance. She does not appear ill. No distress.  Patient is laying on her side and appears weak. Patient has no facial hair or hair on her scalp.  HENT:  Head: Normocephalic and atraumatic.  Right Ear: External ear normal.  Left Ear: External ear normal.  Nose: Nose normal. No mucosal edema or rhinorrhea.  Mouth/Throat: Mucous membranes are dry. No dental abscesses or uvula swelling.  Eyes: Pupils are equal, round, and reactive to light. Conjunctivae and EOM are normal.  Neck: Normal range of motion and full passive range of motion without pain. Neck supple.  Cardiovascular: Normal rate, regular rhythm and normal heart sounds.  Exam reveals no gallop and no friction rub.   No murmur heard. Pulmonary/Chest: Effort normal and breath sounds normal. No respiratory distress. She has no wheezes. She has no rhonchi. She has no rales. She exhibits no tenderness and no crepitus.  Abdominal: Soft. Normal appearance and bowel sounds are normal. She exhibits no distension. There is no tenderness. There is no rebound and no guarding.  Musculoskeletal: Normal range of motion. She exhibits edema. She exhibits no tenderness.  Moves all extremities well. Patient has 1+ pitting edema up to her knees bilaterally and on the dorsum of her feet.  Neurological: She is alert and oriented to person, place, and time. She has normal strength. No cranial nerve deficit.  Skin: Skin is warm, dry and intact. No rash noted. No erythema. No pallor.  Psychiatric: She has a normal mood and affect. Her speech is normal and behavior is normal. Her mood appears not anxious.  Nursing note and vitals reviewed.    ED Treatments / Results  Labs (all labs ordered are listed, but only abnormal results are displayed) Results for orders placed or performed during the  hospital encounter of 16/10/96  Basic metabolic panel  Result Value Ref Range   Sodium 141 135 - 145 mmol/L   Potassium 4.2 3.5 - 5.1 mmol/L  Chloride 108 101 - 111 mmol/L   CO2 25 22 - 32 mmol/L   Glucose, Bld 123 (H) 65 - 99 mg/dL   BUN 18 6 - 20 mg/dL   Creatinine, Ser 0.85 0.44 - 1.00 mg/dL   Calcium 8.8 (L) 8.9 - 10.3 mg/dL   GFR calc non Af Amer >60 >60 mL/min   GFR calc Af Amer >60 >60 mL/min   Anion gap 8 5 - 15  CBC  Result Value Ref Range   WBC 4.3 4.0 - 10.5 K/uL   RBC 2.59 (L) 3.87 - 5.11 MIL/uL   Hemoglobin 9.1 (L) 12.0 - 15.0 g/dL   HCT 27.1 (L) 36.0 - 46.0 %   MCV 104.6 (H) 78.0 - 100.0 fL   MCH 35.1 (H) 26.0 - 34.0 pg   MCHC 33.6 30.0 - 36.0 g/dL   RDW 17.6 (H) 11.5 - 15.5 %   Platelets 133 (L) 150 - 400 K/uL  Urinalysis, Routine w reflex microscopic  Result Value Ref Range   Color, Urine YELLOW YELLOW   APPearance CLEAR CLEAR   Specific Gravity, Urine 1.015 1.005 - 1.030   pH 6.0 5.0 - 8.0   Glucose, UA NEGATIVE NEGATIVE mg/dL   Hgb urine dipstick NEGATIVE NEGATIVE   Bilirubin Urine NEGATIVE NEGATIVE   Ketones, ur NEGATIVE NEGATIVE mg/dL   Protein, ur NEGATIVE NEGATIVE mg/dL   Nitrite NEGATIVE NEGATIVE   Leukocytes, UA NEGATIVE NEGATIVE  Hepatic function panel  Result Value Ref Range   Total Protein 6.5 6.5 - 8.1 g/dL   Albumin 3.5 3.5 - 5.0 g/dL   AST 18 15 - 41 U/L   ALT 20 14 - 54 U/L   Alkaline Phosphatase 57 38 - 126 U/L   Total Bilirubin 0.3 0.3 - 1.2 mg/dL   Bilirubin, Direct <0.1 (L) 0.1 - 0.5 mg/dL   Indirect Bilirubin NOT CALCULATED 0.3 - 0.9 mg/dL  Troponin I  Result Value Ref Range   Troponin I <0.03 <0.03 ng/mL  Magnesium  Result Value Ref Range   Magnesium 1.7 1.7 - 2.4 mg/dL  I-Stat CG4 Lactic Acid, ED  Result Value Ref Range   Lactic Acid, Venous 1.97 (H) 0.5 - 1.9 mmol/L    Laboratory interpretation all normal except stable anemia since July 18     Component 5d ago  Urine Culture, Routine Final report    Organism ID,  Bacteria Escherichia coli    Comment: Greater than 100,000 colony forming units per mL  Cefazolin <=4 ug/mL  Cefazolin with an MIC <=16 predicts susceptibility to the oral agents  cefaclor, cefdinir, cefpodoxime, cefprozil, cefuroxime, cephalexin,  and loracarbef when used for therapy of uncomplicated urinary tract  infections due to E. coli, Klebsiella pneumoniae, and Proteus  mirabilis.   Antimicrobial Susceptibility Comment   Comment:   ** S = Susceptible; I = Intermediate; R = Resistant **           P = Positive; N = Negative        MICS are expressed in micrograms per mL   Antibiotic         RSLT#1  RSLT#2  RSLT#3  RSLT#4  Amoxicillin/Clavulanic Acid  S  Ampicillin           S  Cefepime            S  Ceftriaxone          S  Cefuroxime           S  Ciprofloxacin  S  Ertapenem           S  Gentamicin           S  Imipenem            S  Levofloxacin          S  Meropenem           S  Nitrofurantoin         S  Piperacillin/Tazobactam    S  Tetracycline          S  Tobramycin           S  Trimethoprim/Sulfa       S   Resulting Agency [facility]  Narrative   Testing performed at: [facility], [address] [place], [place], [address], Phone: [phone], Laboratory Director: Lindon Romp, MD Reported Source: Clean Catch Reported Clinical Info Relevant to Order: [id number] Catch          Specimen Collected: 07/07/17 12:41 Last Resulted: 07/09/17 12:38       EKG  EKG Interpretation  Date/Time:  Saturday July 12 2017 22:41:59 EDT Ventricular Rate:  74 PR Interval:    QRS Duration: 158 QT Interval:  464 QTC Calculation: 498 R Axis:   -78 Text Interpretation:  A-V dual-paced complexes w/ some inhibition No further analysis attempted due to paced rhythm No  significant change since last tracing 22 Jun 2017 Confirmed by Rolland Porter [phone]) on 07/12/2017 11:16:26 PM       Radiology No results found.   Dg Chest 2 View  Result Date: 06/22/2017 CLINICAL DATA:  Shortness of breath, nausea after chemotherapy for lung cancer. IMPRESSION: No acute cardiopulmonary process. Re- demonstration of RIGHT upper lobe neoplasm. Electronically Signed   By: Elon Alas M.D.   On: 06/22/2017 22:25   Procedures Procedures (including critical care time)  Medications Ordered in ED Medications  heparin lock flush 100 unit/mL (not administered)  heparin lock flush 100 UNIT/ML injection (not administered)  sodium chloride 0.9 % bolus 1,000 mL (0 mLs Intravenous Stopped 07/13/17 0031)  sodium chloride 0.9 % bolus 1,000 mL (0 mLs Intravenous Stopped 07/13/17 0110)     Initial Impression / Assessment and Plan / ED Course  I have reviewed the triage vital signs and the nursing notes.  Pertinent labs & imaging results that were available during my care of the patient were reviewed by me and considered in my medical decision making (see chart for details).   Patient received IV fluids. Additional laboratory testing was ordered.  Patient ED visit was prolonged due to delay in getting the additional bloodwork done however it is normal. When patient was rechecked she states she feels better as far as the weakness however she still having her peripheral neuropathy pain. She was advised that I did not have any medication at school to make her peripheral neuropathy feel better quickly. This is a hardy been addressed by her oncologist, Dr. Julien Nordmann.   Final Clinical Impressions(s) / ED Diagnoses   Final diagnoses:  Dehydration  Weakness  Peripheral neuropathy due to chemotherapy [facility])    Plan discharge  Rolland Porter, MD, Barbette Or, MD 07/13/17 [phone]

## 2017-07-12 NOTE — ED Triage Notes (Signed)
Pt is a cancer pt and states she feels lifeless and has no energy and has general weakness  Pt states she feels like she may be dehydrated  Pt states she just wants to lay around and is too weak to walk on her own  Pt states she had her last chemo on July 11th  Pt states she has been going to the cancer center and receiving IVF off and on for the past 2 weeks  Pt states her  Potassium and magnesium have been low   Pt has a port a cath and applied numbing cream to it around 2030

## 2017-07-12 NOTE — ED Notes (Signed)
Pt c/o feeling weak/lethargy and difficulty walking (usually ambulatory with assistance) onset Friday. Pt is being seen for lung cancer.

## 2017-07-13 LAB — HEPATIC FUNCTION PANEL
ALT: 20 U/L (ref 14–54)
AST: 18 U/L (ref 15–41)
Albumin: 3.5 g/dL (ref 3.5–5.0)
Alkaline Phosphatase: 57 U/L (ref 38–126)
BILIRUBIN TOTAL: 0.3 mg/dL (ref 0.3–1.2)
Total Protein: 6.5 g/dL (ref 6.5–8.1)

## 2017-07-13 LAB — TROPONIN I

## 2017-07-13 LAB — MAGNESIUM: MAGNESIUM: 1.7 mg/dL (ref 1.7–2.4)

## 2017-07-13 MED ORDER — HEPARIN SOD (PORK) LOCK FLUSH 100 UNIT/ML IV SOLN
500.0000 [IU] | Freq: Once | INTRAVENOUS | Status: AC
  Administered 2017-07-13: 500 [IU]

## 2017-07-13 MED ORDER — HEPARIN SOD (PORK) LOCK FLUSH 100 UNIT/ML IV SOLN
INTRAVENOUS | Status: AC
  Administered 2017-07-13: 03:00:00
  Filled 2017-07-13: qty 5

## 2017-07-13 NOTE — ED Notes (Signed)
Informed Dr Tomi Bamberger that pt wants something for legs cramping.

## 2017-07-13 NOTE — Discharge Instructions (Signed)
Your tests tonight do not show a reason for your weakness, your potassium and magnesium levels are normal. You feel better after the IV fluids. Unfortunately I do not have any medication that is going to make your peripheral neuropathy feel better quickly.  Follow up with Dr Julien Nordmann.

## 2017-07-13 NOTE — ED Notes (Signed)
No respiratory or acute distress noted alert and oriented x 3 call light in reach family at bedside.

## 2017-07-16 ENCOUNTER — Telehealth: Payer: Self-pay | Admitting: Medical Oncology

## 2017-07-16 NOTE — Telephone Encounter (Signed)
Pt left a message re Urine incontinence HS -" I wear pads at night and when I wake up in am pad is soaked and  had no knowledge or urinating ". I called and left message to return my call.

## 2017-07-17 ENCOUNTER — Telehealth: Payer: Self-pay | Admitting: *Deleted

## 2017-07-17 ENCOUNTER — Telehealth: Payer: Self-pay | Admitting: Medical Oncology

## 2017-07-17 ENCOUNTER — Ambulatory Visit (HOSPITAL_BASED_OUTPATIENT_CLINIC_OR_DEPARTMENT_OTHER): Payer: Medicare Other | Admitting: Oncology

## 2017-07-17 ENCOUNTER — Telehealth: Payer: Self-pay | Admitting: Internal Medicine

## 2017-07-17 ENCOUNTER — Other Ambulatory Visit (HOSPITAL_BASED_OUTPATIENT_CLINIC_OR_DEPARTMENT_OTHER): Payer: Medicare Other

## 2017-07-17 ENCOUNTER — Ambulatory Visit: Payer: Medicare Other

## 2017-07-17 ENCOUNTER — Other Ambulatory Visit: Payer: Medicare Other

## 2017-07-17 VITALS — BP 105/64 | HR 73 | Temp 98.2°F | Resp 18 | Ht 60.0 in | Wt 156.5 lb

## 2017-07-17 DIAGNOSIS — G629 Polyneuropathy, unspecified: Secondary | ICD-10-CM | POA: Diagnosis not present

## 2017-07-17 DIAGNOSIS — R32 Unspecified urinary incontinence: Secondary | ICD-10-CM | POA: Diagnosis not present

## 2017-07-17 DIAGNOSIS — C3411 Malignant neoplasm of upper lobe, right bronchus or lung: Secondary | ICD-10-CM

## 2017-07-17 DIAGNOSIS — J449 Chronic obstructive pulmonary disease, unspecified: Secondary | ICD-10-CM

## 2017-07-17 DIAGNOSIS — C3491 Malignant neoplasm of unspecified part of right bronchus or lung: Secondary | ICD-10-CM

## 2017-07-17 DIAGNOSIS — Z452 Encounter for adjustment and management of vascular access device: Secondary | ICD-10-CM

## 2017-07-17 DIAGNOSIS — Z95828 Presence of other vascular implants and grafts: Secondary | ICD-10-CM

## 2017-07-17 DIAGNOSIS — Z5111 Encounter for antineoplastic chemotherapy: Secondary | ICD-10-CM

## 2017-07-17 DIAGNOSIS — G609 Hereditary and idiopathic neuropathy, unspecified: Secondary | ICD-10-CM

## 2017-07-17 LAB — CBC WITH DIFFERENTIAL/PLATELET
BASO%: 0.3 % (ref 0.0–2.0)
Basophils Absolute: 0 10*3/uL (ref 0.0–0.1)
EOS%: 1.4 % (ref 0.0–7.0)
Eosinophils Absolute: 0.1 10*3/uL (ref 0.0–0.5)
HCT: 29.8 % — ABNORMAL LOW (ref 34.8–46.6)
HEMOGLOBIN: 9.8 g/dL — AB (ref 11.6–15.9)
LYMPH%: 37.2 % (ref 14.0–49.7)
MCH: 35 pg — ABNORMAL HIGH (ref 25.1–34.0)
MCHC: 32.9 g/dL (ref 31.5–36.0)
MCV: 106.4 fL — ABNORMAL HIGH (ref 79.5–101.0)
MONO#: 0.5 10*3/uL (ref 0.1–0.9)
MONO%: 12.5 % (ref 0.0–14.0)
NEUT%: 48.6 % (ref 38.4–76.8)
NEUTROS ABS: 1.8 10*3/uL (ref 1.5–6.5)
Platelets: 128 10*3/uL — ABNORMAL LOW (ref 145–400)
RBC: 2.8 10*6/uL — ABNORMAL LOW (ref 3.70–5.45)
RDW: 17 % — AB (ref 11.2–14.5)
WBC: 3.7 10*3/uL — AB (ref 3.9–10.3)
lymph#: 1.4 10*3/uL (ref 0.9–3.3)

## 2017-07-17 LAB — COMPREHENSIVE METABOLIC PANEL
ALBUMIN: 3.6 g/dL (ref 3.5–5.0)
ALK PHOS: 67 U/L (ref 40–150)
ALT: 20 U/L (ref 0–55)
AST: 20 U/L (ref 5–34)
Anion Gap: 8 mEq/L (ref 3–11)
BILIRUBIN TOTAL: 0.54 mg/dL (ref 0.20–1.20)
BUN: 16.1 mg/dL (ref 7.0–26.0)
CO2: 28 mEq/L (ref 22–29)
Calcium: 9.7 mg/dL (ref 8.4–10.4)
Chloride: 102 mEq/L (ref 98–109)
Creatinine: 0.8 mg/dL (ref 0.6–1.1)
EGFR: 66 mL/min/{1.73_m2} — ABNORMAL LOW (ref >90)
GLUCOSE: 97 mg/dL (ref 70–140)
Potassium: 4.1 mEq/L (ref 3.5–5.1)
SODIUM: 138 meq/L (ref 136–145)
TOTAL PROTEIN: 6.9 g/dL (ref 6.4–8.3)

## 2017-07-17 MED ORDER — HEPARIN SOD (PORK) LOCK FLUSH 100 UNIT/ML IV SOLN
500.0000 [IU] | Freq: Once | INTRAVENOUS | Status: AC
  Administered 2017-07-17: 500 [IU] via INTRAVENOUS
  Filled 2017-07-17: qty 5

## 2017-07-17 MED ORDER — SODIUM CHLORIDE 0.9% FLUSH
10.0000 mL | Freq: Once | INTRAVENOUS | Status: AC
  Administered 2017-07-17: 10 mL via INTRAVENOUS
  Filled 2017-07-17: qty 10

## 2017-07-17 MED ORDER — SODIUM CHLORIDE 0.9% FLUSH
10.0000 mL | Freq: Once | INTRAVENOUS | Status: AC
  Administered 2017-07-17: 10 mL
  Filled 2017-07-17: qty 10

## 2017-07-17 NOTE — Patient Instructions (Signed)

## 2017-07-17 NOTE — Telephone Encounter (Addendum)
"  My mom needs to be seen by Dr. Julien Nordmann today. Out of bed one time today.  She is getting worse. Her legs won't work or let her move.  From the waist down she feels lifeless.  Now using walker with my sister's help holding her up instead of the cane.  Every time she turns over in bed or tries to stand up, she pees.  No urinary track infection per ED.  Urinary leakage with laughter due to age but this is worse.  She's gained 2 lbs overnight.  Legs and feet are swollen.  When in hospital Saturday night her B/P = 180/101.   No changes in speech or orientation."  Routing to provider for review and any further communication thru collaborative.

## 2017-07-17 NOTE — Telephone Encounter (Signed)
sw pt to confirm 8/9 appt at 245 per sch msg

## 2017-07-17 NOTE — Telephone Encounter (Signed)
Triage note reviewed and per Julien Nordmann I sent schedule request for pt to have labs and see Kristen today.-dtr notified.

## 2017-07-18 ENCOUNTER — Encounter: Payer: Self-pay | Admitting: Oncology

## 2017-07-18 ENCOUNTER — Other Ambulatory Visit: Payer: Self-pay | Admitting: Medical Oncology

## 2017-07-18 ENCOUNTER — Telehealth: Payer: Self-pay

## 2017-07-18 DIAGNOSIS — C3491 Malignant neoplasm of unspecified part of right bronchus or lung: Secondary | ICD-10-CM

## 2017-07-18 DIAGNOSIS — R32 Unspecified urinary incontinence: Secondary | ICD-10-CM | POA: Insufficient documentation

## 2017-07-18 MED ORDER — GABAPENTIN 100 MG PO CAPS: 200.0000 mg | ORAL_CAPSULE | Freq: Three times a day (TID) | ORAL | 2 refills | Status: DC

## 2017-07-18 MED ORDER — OXYCODONE-ACETAMINOPHEN 5-325 MG PO TABS: 1.0000 | ORAL_TABLET | Freq: Four times a day (QID) | ORAL | 0 refills | Status: DC | PRN

## 2017-07-18 NOTE — Telephone Encounter (Signed)
Family called in for oxycodone refill. She has 6 left and needs for this weekend.

## 2017-07-18 NOTE — Assessment & Plan Note (Signed)
The patient has ongoing peripheral neuropathy. This is most notable in her fingertips as well as her left foot. The patient was instructed to increase her gabapentin to 200 mg 3 times a day. The patient's daughter states that they have enough gabapentin do not need a refill at this time.

## 2017-07-18 NOTE — Assessment & Plan Note (Signed)
This is a very pleasant 83 year old white female with a stage IV non-small cell lung cancer, adenocarcinoma with PDL 1 expression of 5%. The patient is currently undergoing systemic chemotherapy with carboplatin and paclitaxel, status post 5 cycles and has been tolerating her treatment well except for increasing fatigue and weakness as well as development of peripheral neuropathy. Treatment was stopped due to fatigue, weakness and peripheral neuropathy.  She is scheduled for restaging CT scan of the chest, abdomen and pelvis in the next one to 2 weeks. She will follow-up thereafter to review the results.

## 2017-07-18 NOTE — Progress Notes (Signed)
[facility] Follow up:    Maria Manes, MD [address] [place] [address]   DIAGNOSIS: Cancer Staging No matching staging information was found for the patient. Stage IV (T2b, N3, M1 a) non-small cell lung cancer, squamous cell carcinoma presented with large right upper lobe lung mass in addition to bilateral mediastinal lymphadenopathy as well as left upper lobe lung nodule diagnosed in March 2018. PDL 1 expression is 5%.   SUMMARY OF ONCOLOGIC HISTORY:  No history exists.    CURRENT THERAPY: Systemic chemotherapy with carboplatin for AUC of 5 and paclitaxel 175 MG/M2 every 3 weeks. First dose 03/24/2017. Status post 4 cycles.  INTERVAL HISTORY: Maria Washington 83 y.o. female returns for a work in appointment. She is here with her daughter. The patient requests to be seen because she is having difficulty walking and moving her left leg. She is also having increased urinary leakage and incontinence. Patient tells me today that she is having difficulty controlling her urine. His been going out about 2-3 weeks. She wears a pad and reports that it is saturated when she wakes up in the morning. She was treated for urinary tract infection several weeks ago. She was reevaluated in the emergency room on 07/12/2017. Urinalysis was normal at that time. There is no evidence of a UTI. Patient reports that the urine "gushes" out.denies hematuria and dysuria. Reports her bowels are moving without any difficulty. She also reports having neuropathy to her left lower leg as well as her fingertips. She is using a walker and having help from her daughters. She has not had any recent falls. Denies fevers and chills. Denies chest pain, shortness of breath, cough. Ninth abdominal pain, nausea, vomiting. The patient is here for evaluation of her symptoms.   Patient Active Problem List   Diagnosis Date Noted  . Urinary incontinence 07/18/2017  . Hypomagnesemia 06/30/2017     Class: Acute  . Peripheral neuropathy 06/18/2017  . Port catheter in place 04/07/2017  . Goals of care, counseling/discussion 03/17/2017  . Stage IV squamous cell carcinoma of right lung ([place]) 03/04/2017  . Encounter for antineoplastic chemotherapy 03/04/2017  . Lung mass 02/10/2017  . Aortic atherosclerosis ([facility]) 02/10/2017  . Obesity (BMI 30.0-34.9) 02/10/2017  . Long term current use of anticoagulant therapy   . Mitral regurgitation   . CAD (coronary artery disease), native coronary artery   . Chronic diastolic congestive heart failure ([facility])   . Paroxysmal atrial fibrillation ([facility])   . Cardiac pacemaker in situ 12/04/2011  . COPD (chronic obstructive pulmonary disease) ([facility]) 12/02/2011  . History of breast cancer   . Hypertensive heart disease without CHF   . Hypothyroidism     is allergic to clindamycin/lincomycin and penicillins.  MEDICAL HISTORY: Past Medical History:  Diagnosis Date  . Aortic atherosclerosis ([facility]) 02/10/2017  . Arthritis   . CAD (coronary artery disease), native coronary artery    3 vessel calcification noted on CT scan   . Cardiac pacemaker in situ 12/05/2011  . Chronic diastolic congestive heart failure ([facility])   . COPD (chronic obstructive pulmonary disease) ([facility])    spot on lung  . Dehydration 06/04/2017  . Encounter for antineoplastic chemotherapy 03/04/2017  . Goals of care, counseling/discussion 03/17/2017  . History of breast cancer    Treated with lumpectomy, radiation, and chemo sees Dr. Ralene Ok   . Hypertensive heart disease without CHF   . Hypokalemia 04/18/2017  . Hypothyroid   . Mitral regurgitation   .  Paroxysmal atrial fibrillation ([facility])    CHA2DS2VASC score 5  . Second degree heart block   . Stage IV squamous cell carcinoma of right lung ([place]) 03/04/2017    SURGICAL HISTORY: Past Surgical History:  Procedure Laterality Date  . ABDOMINAL HYSTERECTOMY    . BLADDER SUSPENSION    . BREAST LUMPECTOMY     lumpectomy  . CARPAL TUNNEL  RELEASE     left wrist  . CHOLECYSTECTOMY    . FINGER SURGERY    . IR FLUORO GUIDE PORT INSERTION RIGHT  03/12/2017  . IR US GUIDE VASC ACCESS RIGHT  03/12/2017  . PERMANENT PACEMAKER INSERTION N/A 12/04/2011   Procedure: PERMANENT PACEMAKER INSERTION;  Surgeon: Evans Lance, MD;  Location: [facility] CATH LAB;  Service: Cardiovascular;  Laterality: N/A;  . SHOULDER SURGERY    . TONSILLECTOMY AND ADENOIDECTOMY      SOCIAL HISTORY: Social History   Social History  . Marital status: Widowed    Spouse name: N/A  . Number of children: N/A  . Years of education: N/A   Occupational History  . Not on file.   Social History Main Topics  . Smoking status: Former Smoker    Packs/day: 1.00    Years: 60.00    Types: Cigarettes    Quit date: 06/07/2013  . Smokeless tobacco: Never Used  . Alcohol use No  . Drug use: No  . Sexual activity: Not Currently   Other Topics Concern  . Not on file   Social History Narrative   Widow.  Has 22 dogs.    FAMILY HISTORY: Family History  Problem Relation Age of Onset  . Cancer Father   . Heart attack Mother   . Cancer Brother     Review of Systems  Constitutional: Negative.   HENT:  Negative.   Eyes: Negative.   Respiratory: Negative.   Cardiovascular: Negative.   Gastrointestinal: Negative.   Endocrine: Negative.   Genitourinary: Positive for bladder incontinence. Negative for dysuria, frequency, hematuria, nocturia and pelvic pain.   Musculoskeletal: Positive for gait problem.  Skin: Negative.   Neurological: Positive for gait problem and numbness. Negative for dizziness, extremity weakness, headaches, light-headedness, seizures and speech difficulty.  Hematological: Negative.   Psychiatric/Behavioral: Negative.      PHYSICAL EXAMINATION  ECOG PERFORMANCE STATUS: 2 - Symptomatic, <50% confined to bed  Vitals:   07/17/17 1501  BP: 105/64  Pulse: 73  Resp: 18  Temp: 98.2 F (36.8 C)  SpO2: 100%    Physical Exam   Constitutional: She is oriented to person, place, and time and well-developed, well-nourished, and in no distress. No distress.  HENT:  Head: Normocephalic and atraumatic.  Mouth/Throat: Oropharynx is clear and moist. No oropharyngeal exudate.  Eyes: Conjunctivae are normal. Right eye exhibits no discharge. Left eye exhibits no discharge. No scleral icterus.  Neck: Neck supple. No thyromegaly present.  Cardiovascular: Normal rate, regular rhythm and normal heart sounds.   Pulmonary/Chest: Effort normal and breath sounds normal. No respiratory distress. She has no wheezes. She has no rales.  Abdominal: Soft. Bowel sounds are normal. She exhibits no distension and no mass. There is no tenderness.  Musculoskeletal: Normal range of motion. She exhibits no edema, tenderness or deformity.  Patient able to ambulate with assistance of one person. Patient unsure of her footing with her left foot.  Lymphadenopathy:    She has no cervical adenopathy.  Neurological: She is alert and oriented to person, place, and time. She exhibits normal muscle tone.  Coordination normal.  Skin: Skin is warm and dry. No rash noted. She is not diaphoretic. No erythema. No pallor.  Psychiatric: Mood, memory, affect and judgment normal.  Vitals reviewed.   LABORATORY DATA:  CBC    Component Value Date/Time   WBC 3.7 (L) 07/17/2017 1423   WBC 4.3 07/12/2017 2250   RBC 2.80 (L) 07/17/2017 1423   RBC 2.59 (L) 07/12/2017 2250   HGB 9.8 (L) 07/17/2017 1423   HCT 29.8 (L) 07/17/2017 1423   PLT 128 (L) 07/17/2017 1423   MCV 106.4 (H) 07/17/2017 1423   MCH 35.0 (H) 07/17/2017 1423   MCH 35.1 (H) 07/12/2017 2250   MCHC 32.9 07/17/2017 1423   MCHC 33.6 07/12/2017 2250   RDW 17.0 (H) 07/17/2017 1423   LYMPHSABS 1.4 07/17/2017 1423   MONOABS 0.5 07/17/2017 1423   EOSABS 0.1 07/17/2017 1423   BASOSABS 0.0 07/17/2017 1423    CMP     Component Value Date/Time   NA 138 07/17/2017 1423   K 4.1 07/17/2017 1423   CL  108 07/12/2017 2250   CO2 28 07/17/2017 1423   GLUCOSE 97 07/17/2017 1423   BUN 16.1 07/17/2017 1423   CREATININE 0.8 07/17/2017 1423   CALCIUM 9.7 07/17/2017 1423   PROT 6.9 07/17/2017 1423   ALBUMIN 3.6 07/17/2017 1423   AST 20 07/17/2017 1423   ALT 20 07/17/2017 1423   ALKPHOS 67 07/17/2017 1423   BILITOT 0.54 07/17/2017 1423   GFRNONAA >60 07/12/2017 2250   GFRAA >60 07/12/2017 2250     RADIOGRAPHIC STUDIES:  No results found.  ASSESSMENT and THERAPY PLAN:   Stage IV squamous cell carcinoma of right lung [facility]) This is a very pleasant 82 year old white female with a stage IV non-small cell lung cancer, adenocarcinoma with PDL 1 expression of 5%. The patient is currently undergoing systemic chemotherapy with carboplatin and paclitaxel, status post 5 cycles and has been tolerating her treatment well except for increasing fatigue and weakness as well as development of peripheral neuropathy. Treatment was stopped due to fatigue, weakness and peripheral neuropathy.  She is scheduled for restaging CT scan of the chest, abdomen and pelvis in the next one to 2 weeks. She will follow-up thereafter to review the results.  Peripheral neuropathy The patient has ongoing peripheral neuropathy. This is most notable in her fingertips as well as her left foot. The patient was instructed to increase her gabapentin to 200 mg 3 times a day. The patient's daughter states that they have enough gabapentin do not need a refill at this time.  Urinary incontinence The patient has worsening urinary incontinence. She has no difficulty controlling her bowels. Discussed proceeding with an MRI of the lumbar spine to evaluate for possible cancer affecting her spine. However, the patient has a pacemaker and cannot undergo an MRI. I had a lengthy conversation with neuroradiologist. We discussed proceeding with a CT myelogram of the lumbar spine versus paying closer attention to the lumbar spine on the restaging CT  scans that are already scheduled. He did not feel as though the CT myelogram would add a lot of value versus closer attention on the CT scans upcoming. We will keep scans as scheduled and attention to lumbar spine.  The patient was seen with Dr. Julien Nordmann.  Orders Placed This Encounter  Procedures  . Ambulatory referral to Urology    Referral Priority:   Routine    Referral Type:   Consultation    Referral Reason:  Specialty Services Required    Requested Specialty:   Urology    Number of Visits Requested:   1    All questions were answered. The patient knows to call the clinic with any problems, questions or concerns. We can certainly see the patient much sooner if necessary.  Mikey Bussing, NP 07/18/2017   ADDENDUM: Hematology/Oncology Attending: I had a face to face encounter with the patient. I recommended her care plan. This is a very pleasant 83 years old white female with metastatic non-small cell lung cancer, squamous cell carcinoma status post 5 cycles of systemic chemotherapy with carboplatin and paclitaxel. She tolerated the treatment well except for the generalized fatigue and peripheral neuropathy. She also had dehydration requiring IV hydration several times recently. She came today complaining of weakness of the lower extremities bilaterally secondary to peripheral neuropathy as well as urinary incontinence. She has feeling of the rash but she cannot control herself. She is currently on Neurontin 200 mg by mouth twice a day. I recommended for her to increase her dose of Neurontin to 200 mg by mouth 3 times a day. For the urinary incontinence, we will arrange for the patient to see urology for evaluation of her condition and recommendation for its management. She will come back for follow-up visit as previously scheduled. The patient was advised to call immediately if she has any concerning symptoms in the interval. Disclaimer: This note was dictated with voice recognition  software. Similar sounding words can inadvertently be transcribed and may be missed upon review. Eilleen Kempf., MD 07/19/17

## 2017-07-18 NOTE — Assessment & Plan Note (Signed)
The patient has worsening urinary incontinence. She has no difficulty controlling her bowels. Discussed proceeding with an MRI of the lumbar spine to evaluate for possible cancer affecting her spine. However, the patient has a pacemaker and cannot undergo an MRI. I had a lengthy conversation with neuroradiologist. We discussed proceeding with a CT myelogram of the lumbar spine versus paying closer attention to the lumbar spine on the restaging CT scans that are already scheduled. He did not feel as though the CT myelogram would add a lot of value versus closer attention on the CT scans upcoming. We will keep scans as scheduled and attention to lumbar spine.

## 2017-07-21 ENCOUNTER — Telehealth: Payer: Self-pay | Admitting: Medical Oncology

## 2017-07-21 NOTE — Telephone Encounter (Addendum)
Pt left message that she declined urology referral ." I do not have and infection now and I have no problem now". She said she would keep using the pads like she is doing because she cannot  sense when  she has to void. I called pt back and strongly encouraged her to keep referral to urologist. She said she would.  Daughter left a different message requesting order for bathroom handbars installed.

## 2017-07-24 ENCOUNTER — Telehealth: Payer: Self-pay

## 2017-07-24 NOTE — Telephone Encounter (Signed)
Spoke with daughter concerning upcoming appointment for 8/21

## 2017-07-25 ENCOUNTER — Other Ambulatory Visit: Payer: Medicare Other

## 2017-07-25 ENCOUNTER — Encounter (HOSPITAL_COMMUNITY): Payer: Self-pay

## 2017-07-25 ENCOUNTER — Ambulatory Visit (HOSPITAL_COMMUNITY)
Admission: RE | Admit: 2017-07-25 | Discharge: 2017-07-25 | Disposition: A | Discharge status: Home / Self Care | Payer: Medicare Other | Source: Ambulatory Visit | Attending: Internal Medicine | Admitting: Internal Medicine

## 2017-07-25 DIAGNOSIS — I2510 Atherosclerotic heart disease of native coronary artery without angina pectoris: Secondary | ICD-10-CM | POA: Insufficient documentation

## 2017-07-25 DIAGNOSIS — M5136 Other intervertebral disc degeneration, lumbar region: Secondary | ICD-10-CM | POA: Diagnosis not present

## 2017-07-25 DIAGNOSIS — M419 Scoliosis, unspecified: Secondary | ICD-10-CM | POA: Insufficient documentation

## 2017-07-25 DIAGNOSIS — R911 Solitary pulmonary nodule: Secondary | ICD-10-CM | POA: Diagnosis not present

## 2017-07-25 DIAGNOSIS — M4316 Spondylolisthesis, lumbar region: Secondary | ICD-10-CM | POA: Insufficient documentation

## 2017-07-25 DIAGNOSIS — M47816 Spondylosis without myelopathy or radiculopathy, lumbar region: Secondary | ICD-10-CM | POA: Diagnosis not present

## 2017-07-25 DIAGNOSIS — J439 Emphysema, unspecified: Secondary | ICD-10-CM | POA: Diagnosis not present

## 2017-07-25 DIAGNOSIS — J449 Chronic obstructive pulmonary disease, unspecified: Secondary | ICD-10-CM

## 2017-07-25 DIAGNOSIS — N281 Cyst of kidney, acquired: Secondary | ICD-10-CM | POA: Insufficient documentation

## 2017-07-25 DIAGNOSIS — Z5111 Encounter for antineoplastic chemotherapy: Secondary | ICD-10-CM

## 2017-07-25 DIAGNOSIS — C3491 Malignant neoplasm of unspecified part of right bronchus or lung: Secondary | ICD-10-CM

## 2017-07-25 DIAGNOSIS — R590 Localized enlarged lymph nodes: Secondary | ICD-10-CM | POA: Diagnosis not present

## 2017-07-25 DIAGNOSIS — I700 Atherosclerosis of aorta: Secondary | ICD-10-CM | POA: Insufficient documentation

## 2017-07-25 MED ORDER — HEPARIN SOD (PORK) LOCK FLUSH 100 UNIT/ML IV SOLN
500.0000 [IU] | Freq: Once | INTRAVENOUS | Status: AC
  Administered 2017-07-25: 500 [IU] via INTRAVENOUS

## 2017-07-25 MED ORDER — HEPARIN SOD (PORK) LOCK FLUSH 100 UNIT/ML IV SOLN
INTRAVENOUS | Status: AC
  Administered 2017-07-25: 500 [IU] via INTRAVENOUS
  Filled 2017-07-25: qty 5

## 2017-07-25 MED ORDER — IOPAMIDOL (ISOVUE-300) INJECTION 61%
100.0000 mL | Freq: Once | INTRAVENOUS | Status: AC | PRN
  Administered 2017-07-25: 100 mL via INTRAVENOUS

## 2017-07-25 MED ORDER — IOPAMIDOL (ISOVUE-300) INJECTION 61%
INTRAVENOUS | Status: AC
  Administered 2017-07-25: 100 mL via INTRAVENOUS
  Filled 2017-07-25: qty 100

## 2017-07-27 ENCOUNTER — Other Ambulatory Visit: Payer: Self-pay | Admitting: Internal Medicine

## 2017-07-29 ENCOUNTER — Ambulatory Visit (HOSPITAL_BASED_OUTPATIENT_CLINIC_OR_DEPARTMENT_OTHER): Payer: Medicare Other | Admitting: Internal Medicine

## 2017-07-29 ENCOUNTER — Encounter: Payer: Self-pay | Admitting: Internal Medicine

## 2017-07-29 ENCOUNTER — Telehealth: Payer: Self-pay | Admitting: Internal Medicine

## 2017-07-29 ENCOUNTER — Other Ambulatory Visit: Payer: Self-pay | Admitting: Medical Oncology

## 2017-07-29 VITALS — BP 121/59 | HR 83 | Temp 98.0°F | Resp 18 | Ht 60.0 in | Wt 155.1 lb

## 2017-07-29 DIAGNOSIS — R32 Unspecified urinary incontinence: Secondary | ICD-10-CM | POA: Diagnosis not present

## 2017-07-29 DIAGNOSIS — J449 Chronic obstructive pulmonary disease, unspecified: Secondary | ICD-10-CM

## 2017-07-29 DIAGNOSIS — C3491 Malignant neoplasm of unspecified part of right bronchus or lung: Secondary | ICD-10-CM

## 2017-07-29 DIAGNOSIS — G609 Hereditary and idiopathic neuropathy, unspecified: Secondary | ICD-10-CM | POA: Diagnosis not present

## 2017-07-29 DIAGNOSIS — G629 Polyneuropathy, unspecified: Secondary | ICD-10-CM | POA: Diagnosis not present

## 2017-07-29 DIAGNOSIS — C3411 Malignant neoplasm of upper lobe, right bronchus or lung: Secondary | ICD-10-CM

## 2017-07-29 DIAGNOSIS — Z5111 Encounter for antineoplastic chemotherapy: Secondary | ICD-10-CM

## 2017-07-29 MED ORDER — OXYCODONE-ACETAMINOPHEN 5-325 MG PO TABS: 1.0000 | ORAL_TABLET | Freq: Four times a day (QID) | ORAL | 0 refills | Status: DC | PRN

## 2017-07-29 MED ORDER — GABAPENTIN 300 MG PO CAPS: 300.0000 mg | ORAL_CAPSULE | Freq: Three times a day (TID) | ORAL | 1 refills | Status: DC

## 2017-07-29 NOTE — Progress Notes (Signed)
[facility] [phone]   [phone]  OFFICE PROGRESS NOTE  Lajean Manes, MD [address] [place] [address]  DIAGNOSIS: Stage IV (T2b, N3, M1 a) non-small cell lung cancer, squamous cell carcinoma presented with large right upper lobe lung mass in addition to bilateral mediastinal lymphadenopathy as well as left upper lobe lung nodule diagnosed in March 2018. PDL 1 expression is 5%.   PRIOR THERAPY: Systemic chemotherapy with carboplatin for AUC of 5 and paclitaxel 175 MG/M2 every 3 weeks. First dose 03/24/2017. Status post 5 cycles. Last cycle was given 06/18/2017. Discontinued secondary to entrance and significant peripheral neuropathy.  CURRENT THERAPY: None.  INTERVAL HISTORY: Maria Washington 83 y.o. female returns to the clinic today for follow-up visit accompanied by her 2 daughters. The patient continues to complain of significant fatigue and weakness as well as peripheral neuropathy mainly in the lower extremities. She also continues to have urinary incontinence and she was referred to the urology Center but no appointment was given to her yet. She is currently on Neurontin 200 mg by mouth 3 times a day. She denied having any chest pain, shortness of breath, cough or hemoptysis. She denied having any fever or chills. She has no nausea, vomiting, diarrhea or constipation. She had repeat CT scan of the chest, abdomen and pelvis performed recently and she is here for evaluation and discussion of her scan results.   MEDICAL HISTORY: Past Medical History:  Diagnosis Date  . Aortic atherosclerosis ([facility]) 02/10/2017  . Arthritis   . CAD (coronary artery disease), native coronary artery    3 vessel calcification noted on CT scan   . Cardiac pacemaker in situ 12/05/2011  . Chronic diastolic congestive heart failure ([facility])   . COPD (chronic obstructive pulmonary disease) ([facility])    spot on lung  . Dehydration 06/04/2017  . Encounter  for antineoplastic chemotherapy 03/04/2017  . Goals of care, counseling/discussion 03/17/2017  . History of breast cancer    Treated with lumpectomy, radiation, and chemo sees Dr. Ralene Ok   . Hypertensive heart disease without CHF   . Hypokalemia 04/18/2017  . Hypothyroid   . Mitral regurgitation   . Paroxysmal atrial fibrillation ([facility])    CHA2DS2VASC score 5  . Second degree heart block   . Stage IV squamous cell carcinoma of right lung ([facility]) 03/04/2017    ALLERGIES:  is allergic to clindamycin/lincomycin and penicillins.  MEDICATIONS:  Current Outpatient Prescriptions  Medication Sig Dispense Refill  . alendronate (FOSAMAX) 70 MG tablet Take 70 mg by mouth every Monday.     Marland Kitchen apixaban (ELIQUIS) 5 MG TABS tablet Take 1 tablet (5 mg total) by mouth 2 (two) times daily. 60 tablet 3  . Biotin 5000 MCG CAPS Take 5,000 mcg by mouth daily.    . calcium-vitamin D (OSCAL WITH D) 500-200 MG-UNIT per tablet Take 1 tablet by mouth 2 (two) times daily.      . cholecalciferol (VITAMIN D) 1000 units tablet Take 1,000 Units by mouth daily.     Marland Kitchen diltiazem (CARDIZEM CD) 120 MG 24 hr capsule Take 120 mg by mouth daily.  3  . diphenoxylate-atropine (LOMOTIL) 2.5-0.025 MG tablet Take 1 tablet by mouth 4 (four) times daily as needed for diarrhea or loose stools. May take qid if imodium is not working. (Patient taking differently: Take 1 tablet by mouth 4 (four) times daily as needed for diarrhea or loose stools. ) 30 tablet 0  .  folic acid (FOLVITE) 1 MG tablet Take 1 mg by mouth daily.      . furosemide (LASIX) 40 MG tablet Take 40 mg by mouth daily.     Marland Kitchen gabapentin (NEURONTIN) 100 MG capsule Take 2 capsules (200 mg total) by mouth 3 (three) times daily. 90 capsule 2  . levothyroxine (SYNTHROID, LEVOTHROID) 137 MCG tablet Take 137 mcg by mouth daily before breakfast.    . lidocaine-prilocaine (EMLA) cream Apply 1 application topically as needed. (Patient taking differently: Apply 1 application topically  once as needed (prior to port access). ) 30 g 0  . loperamide (IMODIUM) 2 MG capsule Take 2-4 mg by mouth 4 (four) times daily as needed for diarrhea or loose stools.     . Magnesium Oxide 400 (240 Mg) MG TABS TAKE 1 TABLET 3 TIMES A DAY 90 tablet 0  . Omega-3 Fatty Acids (FISH OIL) 1200 MG CAPS Take 1,200 mg by mouth 2 (two) times daily.     Marland Kitchen oxyCODONE-acetaminophen (PERCOCET/ROXICET) 5-325 MG tablet Take 1 tablet by mouth every 6 (six) hours as needed for severe pain. 30 tablet 0  . potassium chloride SA (K-DUR,KLOR-CON) 20 MEQ tablet Take 40 mEq by mouth 2 (two) times daily.     . prochlorperazine (COMPAZINE) 10 MG tablet Take 1 tablet (10 mg total) by mouth every 6 (six) hours as needed for nausea or vomiting. 30 tablet 0  . vitamin B-12 (CYANOCOBALAMIN) 1000 MCG tablet Take 1,000 mcg by mouth daily.       No current facility-administered medications for this visit.     SURGICAL HISTORY:  Past Surgical History:  Procedure Laterality Date  . ABDOMINAL HYSTERECTOMY    . BLADDER SUSPENSION    . BREAST LUMPECTOMY     lumpectomy  . CARPAL TUNNEL RELEASE     left wrist  . CHOLECYSTECTOMY    . FINGER SURGERY    . IR FLUORO GUIDE PORT INSERTION RIGHT  03/12/2017  . IR US GUIDE VASC ACCESS RIGHT  03/12/2017  . PERMANENT PACEMAKER INSERTION N/A 12/04/2011   Procedure: PERMANENT PACEMAKER INSERTION;  Surgeon: Evans Lance, MD;  Location: [facility] C[facility] LAB;  Service: Cardiovascular;  Laterality: N/A;  . SHOULDER SURGERY    . TONSILLECTOMY AND ADENOIDECTOMY      REVIEW OF SYSTEMS:  Constitutional: positive for fatigue Eyes: negative Ears, nose, mouth, throat, and face: negative Respiratory: negative Cardiovascular: negative Gastrointestinal: negative Genitourinary:negative Integument/breast: negative Hematologic/lymphatic: negative Musculoskeletal:positive for muscle weakness Neurological: positive for paresthesia Behavioral/Psych: negative Endocrine: negative Allergic/Immunologic:  negative   PHYSICAL EXAMINATION: General appearance: alert, cooperative, fatigued and no distress Head: Normocephalic, without obvious abnormality, atraumatic Neck: no adenopathy, no JVD, supple, symmetrical, trachea midline and thyroid not enlarged, symmetric, no tenderness/mass/nodules Lymph nodes: Cervical, supraclavicular, and axillary nodes normal. Resp: clear to auscultation bilaterally Back: symmetric, no curvature. ROM normal. No CVA tenderness. Cardio: regular rate and rhythm, S1, S2 normal, no murmur, click, rub or gallop GI: soft, non-tender; bowel sounds normal; no masses,  no organomegaly Extremities: extremities normal, atraumatic, no cyanosis or edema Neurologic: Alert and oriented X 3, normal strength and tone. Normal symmetric reflexes. Normal coordination and gait  ECOG PERFORMANCE STATUS: 2 - Symptomatic, <50% confined to bed  Blood pressure (!) 121/59, pulse 83, temperature 98 F (36.7 C), temperature source Oral, resp. rate 18, height 5' (1.524 m), weight 155 lb 1.6 oz (70.4 kg), SpO2 99 %.  LABORATORY DATA: Lab Results  Component Value Date   WBC 3.7 (L) 07/17/2017  HGB 9.8 (L) 07/17/2017   HCT 29.8 (L) 07/17/2017   MCV 106.4 (H) 07/17/2017   PLT 128 (L) 07/17/2017      Chemistry      Component Value Date/Time   NA 138 07/17/2017 1423   K 4.1 07/17/2017 1423   CL 108 07/12/2017 2250   CO2 28 07/17/2017 1423   BUN 16.1 07/17/2017 1423   CREATININE 0.8 07/17/2017 1423      Component Value Date/Time   CALCIUM 9.7 07/17/2017 1423   ALKPHOS 67 07/17/2017 1423   AST 20 07/17/2017 1423   ALT 20 07/17/2017 1423   BILITOT 0.54 07/17/2017 1423       RADIOGRAPHIC STUDIES: Ct Chest W Contrast  Result Date: 07/25/2017 CLINICAL DATA:  Stage IV squamous cell carcinoma of the right lung. COPD. History of breast cancer. Neuropathy. Shortness of breath. EXAM: CT CHEST, ABDOMEN, AND PELVIS WITH CONTRAST TECHNIQUE: Multidetector CT imaging of the chest, abdomen  and pelvis was performed following the standard protocol during bolus administration of intravenous contrast. CONTRAST:  100 mL Isovue 300 COMPARISON:  Multiple exams, including 05/26/2017 FINDINGS: CT CHEST FINDINGS Cardiovascular: Coronary, aortic arch, and branch vessel atherosclerotic vascular disease. Right Port-A-Cath tip: SVC. Dual lead pacer noted. Mild cardiomegaly. Mediastinum/Nodes: Prevascular node 1.1 cm in short axis on image 25/2, formerly 1.2 cm. AP window lymph node 1.1 cm in short axis on image 26/2, formerly 1.6 cm. Left axillary clips noted. Lungs/Pleura: The right upper lobe mass 4.4 by 3.7 cm, formerly 3.4 by 2.1 cm. Left upper lobe nodule along or within a left upper lobe airway measures 0.6 by 0.4 cm, formerly 0.8 by 0.5 cm. Centrilobular emphysema. Musculoskeletal: Thoracic spondylosis. CT ABDOMEN PELVIS FINDINGS Hepatobiliary: Intrahepatic and extrahepatic biliary dilatation similar to prior, some of this may represent a physiologic response to cholecystectomy. An obvious cause for distal biliary obstruction is not seen. No focal liver lesion identified. Pancreas: Unremarkable Spleen: Speckled hypodensity in the spleen is probably partially due to early contrast phase are technically nonspecific, this is less striking in the visualized part of the spleen on the delayed images. Adrenals/Urinary Tract: Left kidney upper pole simple appearing cysts 4.9 cm in diameter. An adjacent 1.3 cm cyst in the left mid kidney appears to have a posterior calcification. Adrenal glands normal. Right posterior bladder diverticulum. Stomach/Bowel: Unremarkable Vascular/Lymphatic: Aortoiliac atherosclerotic vascular disease. Reproductive: Uterus absent.  Adnexa unremarkable. Other: No supplemental non-categorized findings. Musculoskeletal: Degenerative arthropathy of both hips. Mild dextroconvex lumbar scoliosis with rotary component. Lumbar spondylosis and degenerative disc disease with impingement on the  right at L5-S1 and L4-5. Grade 1 degenerative anterolisthesis at L4-5. IMPRESSION: 1. Mixed appearance with enlargement of the right upper lobe mass but mildly reduced size of the mediastinal adenopathy. Slightly reduced size of the bronchial nodule in the left upper lobe. 2. No findings of metastatic disease to the abdomen or pelvis. 3. Aortic Atherosclerosis (ICD10-I70.0) and Emphysema (ICD10-J43.9). Coronary atherosclerosis. 4. Stable chronic biliary dilatation. Some of this may be a physiologic response to cholecystectomy. 5. Lumbar scoliosis, spondylosis, and degenerative disc disease, with right-sided impingement at L4-5 and L5-S 1. 6. Left renal cysts have benign imaging characteristics. 7. Faint speckled hypodensity in the spleen, likely benign/incidental. Electronically Signed   By: Van Clines M.D.   On: 07/25/2017 14:58   Ct Abdomen Pelvis W Contrast  Result Date: 07/25/2017 CLINICAL DATA:  Stage IV squamous cell carcinoma of the right lung. COPD. History of breast cancer. Neuropathy. Shortness of breath. EXAM: CT CHEST, ABDOMEN,  AND PELVIS WITH CONTRAST TECHNIQUE: Multidetector CT imaging of the chest, abdomen and pelvis was performed following the standard protocol during bolus administration of intravenous contrast. CONTRAST:  100 mL Isovue 300 COMPARISON:  Multiple exams, including 05/26/2017 FINDINGS: CT CHEST FINDINGS Cardiovascular: Coronary, aortic arch, and branch vessel atherosclerotic vascular disease. Right Port-A-Cath tip: SVC. Dual lead pacer noted. Mild cardiomegaly. Mediastinum/Nodes: Prevascular node 1.1 cm in short axis on image 25/2, formerly 1.2 cm. AP window lymph node 1.1 cm in short axis on image 26/2, formerly 1.6 cm. Left axillary clips noted. Lungs/Pleura: The right upper lobe mass 4.4 by 3.7 cm, formerly 3.4 by 2.1 cm. Left upper lobe nodule along or within a left upper lobe airway measures 0.6 by 0.4 cm, formerly 0.8 by 0.5 cm. Centrilobular emphysema.  Musculoskeletal: Thoracic spondylosis. CT ABDOMEN PELVIS FINDINGS Hepatobiliary: Intrahepatic and extrahepatic biliary dilatation similar to prior, some of this may represent a physiologic response to cholecystectomy. An obvious cause for distal biliary obstruction is not seen. No focal liver lesion identified. Pancreas: Unremarkable Spleen: Speckled hypodensity in the spleen is probably partially due to early contrast phase are technically nonspecific, this is less striking in the visualized part of the spleen on the delayed images. Adrenals/Urinary Tract: Left kidney upper pole simple appearing cysts 4.9 cm in diameter. An adjacent 1.3 cm cyst in the left mid kidney appears to have a posterior calcification. Adrenal glands normal. Right posterior bladder diverticulum. Stomach/Bowel: Unremarkable Vascular/Lymphatic: Aortoiliac atherosclerotic vascular disease. Reproductive: Uterus absent.  Adnexa unremarkable. Other: No supplemental non-categorized findings. Musculoskeletal: Degenerative arthropathy of both hips. Mild dextroconvex lumbar scoliosis with rotary component. Lumbar spondylosis and degenerative disc disease with impingement on the right at L5-S1 and L4-5. Grade 1 degenerative anterolisthesis at L4-5. IMPRESSION: 1. Mixed appearance with enlargement of the right upper lobe mass but mildly reduced size of the mediastinal adenopathy. Slightly reduced size of the bronchial nodule in the left upper lobe. 2. No findings of metastatic disease to the abdomen or pelvis. 3. Aortic Atherosclerosis (ICD10-I70.0) and Emphysema (ICD10-J43.9). Coronary atherosclerosis. 4. Stable chronic biliary dilatation. Some of this may be a physiologic response to cholecystectomy. 5. Lumbar scoliosis, spondylosis, and degenerative disc disease, with right-sided impingement at L4-5 and L5-S 1. 6. Left renal cysts have benign imaging characteristics. 7. Faint speckled hypodensity in the spleen, likely benign/incidental.  Electronically Signed   By: Van Clines M.D.   On: 07/25/2017 14:58    ASSESSMENT AND PLAN:  This is a very pleasant 83 years old white female with a stage IV non-small cell lung cancer, adenocarcinoma with PDL 1 expression of 5%. The patient underwent systemic chemotherapy with carboplatin and paclitaxel, status post 5 cycles and has been tolerating her treatment well except for increasing fatigue and weakness as well as development of peripheral neuropathy. The patient had repeat CT scan of the chest, abdomen and pelvis performed recently that showed mixed response with enlargement of the right upper lobe lung mass but there was reviewed size of the mediastinal lymphadenopathy as well as the nodule in the left upper lobe. I personally and independently reviewed the scan images and discuss the results with the patient and her family. I recommended for the patient to see radiation oncology for consideration of palliative radiotherapy to the enlarging right lower lobe lung mass. I also discussed with the patient treatment with immunotherapy radiotherapy is not an option. The patient is interested in considering radiotherapy to the enlarging mass. I will arrange for her to come back for follow-up visit  in 3 months for reevaluation with repeat CT scan of the chest, abdomen and pelvis for restaging of her disease. For the patient to see, I will increase her dose of Neurontin to 300 mg by mouth 3 times a day. I will also refer the patient to neurology for evaluation of her condition. For urinary incontinence, I will refer the patient again to urology for evaluation. The patient was advised to call immediately if she has any concerning symptoms in the interval. The patient voices understanding of current disease status and treatment options and is in agreement with the current care plan. All questions were answered. The patient knows to call the clinic with any problems, questions or concerns. We  can certainly see the patient much sooner if necessary. I spent 30 minutes counseling the patient face to face. The total time spent in the appointment was 40 minutes.  Disclaimer: This note was dictated with voice recognition software. Similar sounding words can inadvertently be transcribed and may not be corrected upon review.

## 2017-07-29 NOTE — Telephone Encounter (Signed)
Gave patient avs and calendar for appts in November.

## 2017-07-30 ENCOUNTER — Encounter: Payer: Self-pay | Admitting: Radiation Oncology

## 2017-07-31 NOTE — Progress Notes (Signed)
Thoracic Location of Tumor / Histology: Stage IV (T2b, N3, M1 a) non-small cell lung cancer, squamous cell carcinoma presented with large right upper lobe lung mass in addition with bilateral mediastinal lymphadenopathy.  PDL 1 expression is 5%.  Patient presented to her PCP Dr. Felipa Eth with [facility] back in March 2018.  Patient had 3 episodes of hemoptysis.  Chest xray revealed possible right upper lobe mass, which was later confirmed by CT scan.  Patient was treated by Dr. Valere Dross back in 02/25/02 - 04/13/02 for Stage II ductal carcinoma of the left breast  Biopsies of lung revealed:   02/27/2017   Tobacco/Marijuana/Snuff/ETOH use: long history of cigarettes; quit 40 years ago.  Alcohol social use  Past/Anticipated interventions by cardiothoracic surgery, if any: None  Past/Anticipated interventions by medical oncology, if any: Carboplatin, Paclitaxel every 3 weeks.  First dose 03/24/2017, last dose 06/18/2017, discontinued secondary to significant peripheral neuropathy  Signs/Symptoms  Weight changes, if any: no  Respiratory complaints, if any: Nasal congestion. Cough began last week and is occasionally productive with thick yellow sputum. Reports SOB with mild exertion.   Hemoptysis, if any: no  Pain issues, if any:  Reports she aches all over related to neuropathy despite gabapentin  SAFETY ISSUES:  Prior radiation? Yes 02/25/02 - 04/13/02 for breast ca  Pacemaker/ICD? yes 12/04/2011  Possible current pregnancy?no  Is the patient on methotrexate? no  Current Complaints / other details:  Syniyah Bourne is 83 year old female with past medical history of hypertension, hypothyroidism, heart valve disease, pacemaker placement, hypokalemia, breast cancer history 2002 with left lumpectomy follow by chemotherapy, radiation and hormonal therapy.  Long history of smoking but quit 40 years ago.  Patient is a widow and has 7 children. Patient has a daughter who lives with  her and assist her in caring for 22 dogs. Patient was consulted in the multidisciplinary thoracic oncology clinic.  Patient suffered significant fatigue and weakness along with peripheral neuropathy in the lower extremities. Patient scheduled for initial consult with neurologist today at 1 pm.  Patient complains of urinary incontinence.

## 2017-08-04 ENCOUNTER — Ambulatory Visit
Admission: RE | Admit: 2017-08-04 | Discharge: 2017-08-04 | Disposition: A | Discharge status: Home / Self Care | Payer: Medicare Other | Source: Ambulatory Visit | Attending: Radiation Oncology | Admitting: Radiation Oncology

## 2017-08-04 ENCOUNTER — Encounter: Payer: Self-pay | Admitting: *Deleted

## 2017-08-04 ENCOUNTER — Ambulatory Visit (INDEPENDENT_AMBULATORY_CARE_PROVIDER_SITE_OTHER): Payer: Medicare Other | Admitting: Neurology

## 2017-08-04 ENCOUNTER — Encounter: Payer: Self-pay | Admitting: Neurology

## 2017-08-04 ENCOUNTER — Encounter: Payer: Self-pay | Admitting: Radiation Oncology

## 2017-08-04 VITALS — BP 136/71 | HR 74 | Temp 98.6°F | Resp 20 | Ht 60.0 in | Wt 153.4 lb

## 2017-08-04 VITALS — BP 107/57 | HR 76 | Ht 60.0 in

## 2017-08-04 DIAGNOSIS — Z9049 Acquired absence of other specified parts of digestive tract: Secondary | ICD-10-CM | POA: Insufficient documentation

## 2017-08-04 DIAGNOSIS — Z950 Presence of cardiac pacemaker: Secondary | ICD-10-CM | POA: Diagnosis not present

## 2017-08-04 DIAGNOSIS — Z90710 Acquired absence of both cervix and uterus: Secondary | ICD-10-CM | POA: Insufficient documentation

## 2017-08-04 DIAGNOSIS — R531 Weakness: Secondary | ICD-10-CM | POA: Diagnosis not present

## 2017-08-04 DIAGNOSIS — C3411 Malignant neoplasm of upper lobe, right bronchus or lung: Secondary | ICD-10-CM

## 2017-08-04 DIAGNOSIS — T451X5A Adverse effect of antineoplastic and immunosuppressive drugs, initial encounter: Secondary | ICD-10-CM

## 2017-08-04 DIAGNOSIS — Z880 Allergy status to penicillin: Secondary | ICD-10-CM | POA: Insufficient documentation

## 2017-08-04 DIAGNOSIS — Z7983 Long term (current) use of bisphosphonates: Secondary | ICD-10-CM | POA: Insufficient documentation

## 2017-08-04 DIAGNOSIS — C3491 Malignant neoplasm of unspecified part of right bronchus or lung: Secondary | ICD-10-CM

## 2017-08-04 DIAGNOSIS — E039 Hypothyroidism, unspecified: Secondary | ICD-10-CM | POA: Diagnosis not present

## 2017-08-04 DIAGNOSIS — R200 Anesthesia of skin: Secondary | ICD-10-CM | POA: Diagnosis not present

## 2017-08-04 DIAGNOSIS — I340 Nonrheumatic mitral (valve) insufficiency: Secondary | ICD-10-CM | POA: Diagnosis not present

## 2017-08-04 DIAGNOSIS — Z8249 Family history of ischemic heart disease and other diseases of the circulatory system: Secondary | ICD-10-CM | POA: Diagnosis not present

## 2017-08-04 DIAGNOSIS — I480 Paroxysmal atrial fibrillation: Secondary | ICD-10-CM | POA: Diagnosis not present

## 2017-08-04 DIAGNOSIS — I2510 Atherosclerotic heart disease of native coronary artery without angina pectoris: Secondary | ICD-10-CM | POA: Insufficient documentation

## 2017-08-04 DIAGNOSIS — Z881 Allergy status to other antibiotic agents status: Secondary | ICD-10-CM | POA: Diagnosis not present

## 2017-08-04 DIAGNOSIS — R2689 Other abnormalities of gait and mobility: Secondary | ICD-10-CM | POA: Diagnosis not present

## 2017-08-04 DIAGNOSIS — M1990 Unspecified osteoarthritis, unspecified site: Secondary | ICD-10-CM | POA: Insufficient documentation

## 2017-08-04 DIAGNOSIS — Z923 Personal history of irradiation: Secondary | ICD-10-CM | POA: Insufficient documentation

## 2017-08-04 DIAGNOSIS — Z87891 Personal history of nicotine dependence: Secondary | ICD-10-CM | POA: Diagnosis not present

## 2017-08-04 DIAGNOSIS — Z510 Encounter for antineoplastic radiation therapy: Secondary | ICD-10-CM | POA: Insufficient documentation

## 2017-08-04 DIAGNOSIS — I110 Hypertensive heart disease with heart failure: Secondary | ICD-10-CM | POA: Diagnosis not present

## 2017-08-04 DIAGNOSIS — R042 Hemoptysis: Secondary | ICD-10-CM | POA: Insufficient documentation

## 2017-08-04 DIAGNOSIS — Z853 Personal history of malignant neoplasm of breast: Secondary | ICD-10-CM

## 2017-08-04 DIAGNOSIS — C7802 Secondary malignant neoplasm of left lung: Secondary | ICD-10-CM | POA: Insufficient documentation

## 2017-08-04 DIAGNOSIS — J449 Chronic obstructive pulmonary disease, unspecified: Secondary | ICD-10-CM | POA: Diagnosis not present

## 2017-08-04 DIAGNOSIS — Z8051 Family history of malignant neoplasm of kidney: Secondary | ICD-10-CM | POA: Diagnosis not present

## 2017-08-04 DIAGNOSIS — Z801 Family history of malignant neoplasm of trachea, bronchus and lung: Secondary | ICD-10-CM | POA: Diagnosis not present

## 2017-08-04 DIAGNOSIS — Z7901 Long term (current) use of anticoagulants: Secondary | ICD-10-CM | POA: Insufficient documentation

## 2017-08-04 DIAGNOSIS — W19XXXA Unspecified fall, initial encounter: Secondary | ICD-10-CM

## 2017-08-04 DIAGNOSIS — I5032 Chronic diastolic (congestive) heart failure: Secondary | ICD-10-CM | POA: Diagnosis not present

## 2017-08-04 DIAGNOSIS — Z79899 Other long term (current) drug therapy: Secondary | ICD-10-CM | POA: Insufficient documentation

## 2017-08-04 DIAGNOSIS — R270 Ataxia, unspecified: Secondary | ICD-10-CM | POA: Diagnosis not present

## 2017-08-04 DIAGNOSIS — G620 Drug-induced polyneuropathy: Secondary | ICD-10-CM

## 2017-08-04 MED ORDER — GABAPENTIN 300 MG PO CAPS: 600.0000 mg | ORAL_CAPSULE | Freq: Three times a day (TID) | ORAL | 6 refills | Status: DC

## 2017-08-04 NOTE — Progress Notes (Signed)
Oncology Nurse Navigator Documentation  Oncology Nurse Navigator Flowsheets 08/04/2017  Navigator Location [facility]  Navigator Encounter Type Other/I received a call from [facility] regarding Maria Washington and treatment plan for systemic therapy.  I spoke with Dr. Julien Nordmann and he states she is not a good candidate for systemic treatment at this time and that is why growing lesion will receive XRT.  I updated [facility] surgeon.   Patient Visit Type RadOnc  Treatment Phase Pre-Tx/Tx Discussion  Barriers/Navigation Needs Coordination of Care  Interventions Coordination of Care  Coordination of Care Other  Acuity Level 2

## 2017-08-04 NOTE — Progress Notes (Signed)
[facility]    Provider:  Dr Jaynee Eagles Referring Provider: Julien Nordmann, MD Primary Care Physician:  Lajean Manes, MD  CC:  Peripheral neuropathy chemotherapy-induced  HPI:  Maria Washington is a 83 y.o. female here as a referral from Dr. Felipa Eth for neuropathy. Past medical history of hypertension, heart disease, stage IV non-small cell lung cancer, squamous cell carcinoma presented with large right upper lobe lung mass in addition to bilateral mediastinal lymphadenopathy as well as left upper lobe lung nodule diagnosed in March 2018. Patient's prior therapy includes systemic chemotherapy with carboplatin and paclitaxel first dose was April 2018 status post 5 cycles. Chemotherapy was discontinued secondary to significant peripheral neuropathy. Foot pain started during chemotherapy in the fingers as well. She can't feel anything in the feet. Her bones ache but no burnig, tingling, shooting pains. Her legs also hurt, she has been taking sleeping pillas and pain pills. She has aching pains in her legs. Knee feels like it wants to buckle. Her balance is very much affected, she uses a walker. No improvement since July 11th. Hands are cold. No other focal neurologic deficits, associated symptoms, inciting events or modifiable factors. She is here with a friend who provides much information.  Reviewed notes, labs and imaging from outside physicians, which showed:    Past medical history of stage IV non-small cell lung cancer, squamous cell carcinoma presented with large right upper lobe lung mass in addition to bilateral mediastinal lymphadenopathy as well as left upper lobe lung nodule diagnosed in March 2018. Patient's prior therapy includes systemic chemotherapy with carboplatin and paclitaxel first dose was April 2018 status post 5 cycles. She complains of significant fatigue and weakness as well as peripheral neuropathy mainly in the lower legs. She also continues to have urinary  incontinence. She is currently on Neurontin 200 mg by mouth 3 times a day.  Review of Systems: Patient complains of symptoms per HPI as well as the following symptoms: Weight loss, fatigue, easy bruising, shortness of breath, cough, swelling of legs, snoring, feeling cold, skin sensitivity, numbness, weakness, sleepiness, snoring, restless legs. Pertinent negatives and positives per HPI. All others negative.   Social History   Social History  . Marital status: Widowed    Spouse name: N/A  . Number of children: 6  . Years of education: 69   Occupational History  . Not on file.   Social History Main Topics  . Smoking status: Former Smoker    Packs/day: 1.00    Years: 60.00    Types: Cigarettes    Quit date: 06/07/2013  . Smokeless tobacco: Never Used  . Alcohol use No  . Drug use: No  . Sexual activity: Not Currently   Other Topics Concern  . Not on file   Social History Narrative   Widow.  Has 22 dogs.   Right handed    Caffeine use: Coffee- 2 cups per day or less   Decaf Mtn Dew   Lives with daughter       Family History  Problem Relation Age of Onset  . Heart attack Mother   . Cancer Brother        kidney  . Cancer Daughter        lung    Past Medical History:  Diagnosis Date  . Aortic atherosclerosis ([place]) 02/10/2017  . Arthritis   . CAD (coronary artery disease), native coronary artery    3 vessel calcification noted on CT scan   . Cardiac pacemaker in situ 12/05/2011  .  Chronic diastolic congestive heart failure ([place])   . COPD (chronic obstructive pulmonary disease) ([facility])    spot on lung  . Dehydration 06/04/2017  . Encounter for antineoplastic chemotherapy 03/04/2017  . Goals of care, counseling/discussion 03/17/2017  . History of breast cancer    Treated with lumpectomy, radiation, and chemo sees Dr. Ralene Ok   . Hypertensive heart disease without CHF   . Hypokalemia 04/18/2017  . Hypothyroid   . Mitral regurgitation   . Paroxysmal atrial fibrillation  ([facility])    CHA2DS2VASC score 5  . Second degree heart block   . Stage IV squamous cell carcinoma of right lung ([place]) 03/04/2017    Past Surgical History:  Procedure Laterality Date  . ABDOMINAL HYSTERECTOMY    . BLADDER SUSPENSION    . BREAST LUMPECTOMY     lumpectomy  . CARPAL TUNNEL RELEASE     left wrist  . CHOLECYSTECTOMY    . FINGER SURGERY    . IR FLUORO GUIDE PORT INSERTION RIGHT  03/12/2017  . IR US GUIDE VASC ACCESS RIGHT  03/12/2017  . PERMANENT PACEMAKER INSERTION N/A 12/04/2011   Procedure: PERMANENT PACEMAKER INSERTION;  Surgeon: Evans Lance, MD;  Location: [facility] CATH LAB;  Service: Cardiovascular;  Laterality: N/A;  . SHOULDER SURGERY    . TONSILLECTOMY AND ADENOIDECTOMY      Current Outpatient Prescriptions  Medication Sig Dispense Refill  . alendronate (FOSAMAX) 70 MG tablet Take 70 mg by mouth every Monday.     Marland Kitchen apixaban (ELIQUIS) 5 MG TABS tablet Take 1 tablet (5 mg total) by mouth 2 (two) times daily. 60 tablet 3  . Biotin 5000 MCG CAPS Take 5,000 mcg by mouth daily.    . calcium-vitamin D (OSCAL WITH D) 500-200 MG-UNIT per tablet Take 1 tablet by mouth 2 (two) times daily.      . cholecalciferol (VITAMIN D) 1000 units tablet Take 1,000 Units by mouth daily.     Marland Kitchen diltiazem (CARDIZEM CD) 120 MG 24 hr capsule Take 120 mg by mouth daily.  3  . diphenoxylate-atropine (LOMOTIL) 2.5-0.025 MG tablet Take 1 tablet by mouth 4 (four) times daily as needed for diarrhea or loose stools. May take qid if imodium is not working. 30 tablet 0  . folic acid (FOLVITE) 1 MG tablet Take 1 mg by mouth daily.      . furosemide (LASIX) 40 MG tablet Take 40 mg by mouth daily.     Marland Kitchen levothyroxine (SYNTHROID, LEVOTHROID) 137 MCG tablet Take 137 mcg by mouth daily before breakfast.    . lidocaine-prilocaine (EMLA) cream Apply 1 application topically as needed. 30 g 0  . loperamide (IMODIUM) 2 MG capsule Take 2-4 mg by mouth 4 (four) times daily as needed for diarrhea or loose stools.     .  Magnesium Oxide 400 (240 Mg) MG TABS TAKE 1 TABLET 3 TIMES A DAY 90 tablet 0  . Omega-3 Fatty Acids (FISH OIL) 1200 MG CAPS Take 1,200 mg by mouth 2 (two) times daily.     . potassium chloride SA (K-DUR,KLOR-CON) 20 MEQ tablet Take 40 mEq by mouth 2 (two) times daily.     . prochlorperazine (COMPAZINE) 10 MG tablet Take 1 tablet (10 mg total) by mouth every 6 (six) hours as needed for nausea or vomiting. 30 tablet 0  . vitamin B-12 (CYANOCOBALAMIN) 1000 MCG tablet Take 1,000 mcg by mouth daily.      Marland Kitchen gabapentin (NEURONTIN) 300 MG capsule Take 2 capsules (600 mg total) by  mouth 3 (three) times daily. 180 capsule 6  . oxyCODONE-acetaminophen (PERCOCET/ROXICET) 5-325 MG tablet Take 1 tablet by mouth every 6 (six) hours as needed for severe pain. 30 tablet 0   No current facility-administered medications for this visit.     Allergies as of 08/04/2017 - Review Complete 08/04/2017  Allergen Reaction Noted  . Clindamycin/lincomycin Rash 12/02/2011  . Penicillins Rash and Other (See Comments) 12/02/2011    Vitals: BP (!) 107/57 (BP Location: Right Arm, Patient Position: Sitting, Cuff Size: Normal)   Pulse 76   Ht 5' (1.524 m)   SpO2 96%   BMI 29.96 kg/m  Last Weight:  Wt Readings from Last 1 Encounters:  08/04/17 153 lb 6.4 oz (69.6 kg)   Last Height:   Ht Readings from Last 1 Encounters:  08/04/17 5' (1.524 m)    Physical exam: Exam: Gen: Appears in distress CV: RRR, no MRG. No Carotid Bruits. +peripheral edema, warm, nontender Eyes: Conjunctivae clear without exudates or hemorrhage  Neuro: Detailed Neurologic Exam  Speech:    Speech is normal; fluent and spontaneous with normal comprehension.  Cognition:    The patient is oriented to person, place, and time;     recent and remote memory intact;     language fluent;     normal attention, concentration,     fund of knowledge Cranial Nerves:    The pupils are equal, round, and reactive to light. Attempted funduscopic exam  could not visualize. Visual fields are full to finger confrontation. Extraocular movements are intact. Trigeminal sensation is intact and the muscles of mastication are normal. The face is symmetric. The palate elevates in the midline. Hearing intact. Voice is normal. Shoulder shrug is normal. The tongue has normal motion without fasciculations.   Coordination:    No dysmetria  Gait:    Wide based sensory ataxia  Motor Observation:    no involuntary movements noted Tone:    Normal muscle tone.    Posture:    Posture is stooped    Strength: 4+/5 LE with 3/5 DF        Sensation: decreased pinrpick to knees and in hands, absent vibration and proprioception     Reflex Exam:  DTR's:    Deep tendon reflexes in the lower extremities are absent bilaterally.   Toes:    The toes are downgoing bilaterally.   Clonus:    Clonus is absent.      Assessment/Plan:  83 y.o. female here as a referral from Dr. Felipa Eth for chemotherapy-induced neuropathy. Discussed the etiology of this neuropathy, last treatment was several months ago and she may or may not improve in the next year. At this point management of pain in his treatment and fall prevention. Discussed falls, due to proprioceptive deficits in the feet. Advised at night's to always keep legs on so as not to fall. We'll referred for ambulatory physical therapy to help with strengthening, balance, fall risks in gait.  Home physical therapy As far as your medications are concerned, I would like to suggest: Increase Neurontin to 600mg  three times a day Will check several labs for other causes of neuropathy however this is most likely chemotherapy-induced I would like to see you back in 3 months, sooner if we need to. Please call us with any interim questions, concerns, problems, updates or refill requests.  Discussed fall risks and safety Can try Lyrica and patient may need to take an increased dose of opioids for this significant peripheral  neuropathy  Orders Placed This Encounter  Procedures  . Ambulatory referral to [facility] [address] [place], [place] [address]  Phone [phone] Fax [phone]

## 2017-08-04 NOTE — Progress Notes (Signed)
Radiation Oncology         [phone] ________________________________  Initial Outpatient Consultation  Name: Maria Washington MRN: [id number]  Date of Service: 08/04/2017 DOB: 08-Dec-1929  LO:VFIEPPIRJ, Maria Ha, MD  Curt Bears, MD   REFERRING PHYSICIAN: Curt Bears, MD  DIAGNOSIS: 83 y.o. female with Stage IV, T2bN3M1a, NSCLC, squamous cell carcinoma of the right upper lobe    ICD-10-CM   1. Stage IV squamous cell carcinoma of right lung ([place]) C34.91   2. Malignant neoplasm of right upper lobe of lung (HCC) C34.11   3. Chemotherapy-induced neuropathy ([place]) G62.0    T45.1X5A   4. History of breast cancer Z85.3     HISTORY OF PRESENT ILLNESS: Maria Washington is a 83 y.o. female seen at the request of Dr. Julien Washington for a history of Stage IV, T2bN3M1a, NSCLC, squamous cell carcinoma of hte right upper lobe with metastatic disease to the left upper lobe, diagnosed in March 2018. She has completed systemic therapy with Carboplatin/Taxol every 3 weeks between 03/24/2017-06/18/2017, but this was discontinued due to progressive neuropathy, which she has been on neurontin for. She proceeded with repeat staging CTs on 07/25/2017 which revealed a mixed response. She had regression in the LUL lesion and of the mediastinal adenopathy. The RUL lesion grew from 3.4 x 2.1 cm to 4.4 x 3.7 cm. She comes today to discuss options of palliative radiotherapy.    PREVIOUS RADIATION THERAPY: Yes  02/25/2002 to 04/13/2002 33 fractions to Left breast treated by Dr. Valere Washington  PAST MEDICAL HISTORY:  Past Medical History:  Diagnosis Date  . Aortic atherosclerosis ([place]) 02/10/2017  . Arthritis   . CAD (coronary artery disease), native coronary artery    3 vessel calcification noted on CT scan   . Cardiac pacemaker in situ 12/05/2011  . Chronic diastolic congestive heart failure ([place])   . COPD (chronic obstructive pulmonary disease) (HCC)    spot on lung  . Dehydration 06/04/2017  . Encounter for  antineoplastic chemotherapy 03/04/2017  . Goals of care, counseling/discussion 03/17/2017  . History of breast cancer    Treated with lumpectomy, radiation, and chemo sees Dr. Ralene Washington   . Hypertensive heart disease without CHF   . Hypokalemia 04/18/2017  . Hypothyroid   . Mitral regurgitation   . Paroxysmal atrial fibrillation (HCC)    CHA2DS2VASC score 5  . Second degree heart block   . Stage IV squamous cell carcinoma of right lung ([place]) 03/04/2017      PAST SURGICAL HISTORY: Past Surgical History:  Procedure Laterality Date  . ABDOMINAL HYSTERECTOMY    . BLADDER SUSPENSION    . BREAST LUMPECTOMY     lumpectomy  . CARPAL TUNNEL RELEASE     left wrist  . CHOLECYSTECTOMY    . FINGER SURGERY    . IR FLUORO GUIDE PORT INSERTION RIGHT  03/12/2017  . IR US GUIDE VASC ACCESS RIGHT  03/12/2017  . PERMANENT PACEMAKER INSERTION N/A 12/04/2011   Procedure: PERMANENT PACEMAKER INSERTION;  Surgeon: Evans Lance, MD;  Location: [facility] CATH LAB;  Service: Cardiovascular;  Laterality: N/A;  . SHOULDER SURGERY    . TONSILLECTOMY AND ADENOIDECTOMY      FAMILY HISTORY:  Family History  Problem Relation Age of Onset  . Heart attack Mother   . Cancer Brother        kidney  . Cancer Daughter        lung    SOCIAL HISTORY:  Social History   Social History  . Marital  status: Widowed    Spouse name: N/A  . Number of children: 6  . Years of education: 58   Occupational History  . Not on file.   Social History Main Topics  . Smoking status: Former Smoker    Packs/day: 1.00    Years: 60.00    Types: Cigarettes    Quit date: 06/07/2013  . Smokeless tobacco: Never Used  . Alcohol use No  . Drug use: No  . Sexual activity: Not Currently   Other Topics Concern  . Not on file   Social History Narrative   Widow.  Has 22 dogs.   Right handed    Caffeine use: Coffee- 2 cups per day or less   Decaf Mtn Dew   Lives with daughter       ALLERGIES: Clindamycin/lincomycin and  Penicillins  MEDICATIONS:  Current Outpatient Prescriptions  Medication Sig Dispense Refill  . alendronate (FOSAMAX) 70 MG tablet Take 70 mg by mouth every Monday.     Marland Kitchen apixaban (ELIQUIS) 5 MG TABS tablet Take 1 tablet (5 mg total) by mouth 2 (two) times daily. 60 tablet 3  . Biotin 5000 MCG CAPS Take 5,000 mcg by mouth daily.    . calcium-vitamin D (OSCAL WITH D) 500-200 MG-UNIT per tablet Take 1 tablet by mouth 2 (two) times daily.      . cholecalciferol (VITAMIN D) 1000 units tablet Take 1,000 Units by mouth daily.     Marland Kitchen diltiazem (CARDIZEM CD) 120 MG 24 hr capsule Take 120 mg by mouth daily.  3  . folic acid (FOLVITE) 1 MG tablet Take 1 mg by mouth daily.      . furosemide (LASIX) 40 MG tablet Take 40 mg by mouth daily.     Marland Kitchen levothyroxine (SYNTHROID, LEVOTHROID) 137 MCG tablet Take 137 mcg by mouth daily before breakfast.    . Magnesium Oxide 400 (240 Mg) MG TABS TAKE 1 TABLET 3 TIMES A DAY 90 tablet 0  . Omega-3 Fatty Acids (FISH OIL) 1200 MG CAPS Take 1,200 mg by mouth 2 (two) times daily.     . potassium chloride SA (K-DUR,KLOR-CON) 20 MEQ tablet Take 40 mEq by mouth 2 (two) times daily.     . vitamin B-12 (CYANOCOBALAMIN) 1000 MCG tablet Take 1,000 mcg by mouth daily.      . diphenoxylate-atropine (LOMOTIL) 2.5-0.025 MG tablet Take 1 tablet by mouth 4 (four) times daily as needed for diarrhea or loose stools. May take qid if imodium is not working. 30 tablet 0  . gabapentin (NEURONTIN) 300 MG capsule Take 2 capsules (600 mg total) by mouth 3 (three) times daily. 180 capsule 6  . lidocaine-prilocaine (EMLA) cream Apply 1 application topically as needed. 30 g 0  . loperamide (IMODIUM) 2 MG capsule Take 2-4 mg by mouth 4 (four) times daily as needed for diarrhea or loose stools.     Marland Kitchen oxyCODONE-acetaminophen (PERCOCET/ROXICET) 5-325 MG tablet Take 1 tablet by mouth every 6 (six) hours as needed for severe pain. 30 tablet 0  . prochlorperazine (COMPAZINE) 10 MG tablet Take 1 tablet (10  mg total) by mouth every 6 (six) hours as needed for nausea or vomiting. 30 tablet 0   No current facility-administered medications for this encounter.     REVIEW OF SYSTEMS:  On review of systems, the patient reports that she is doing well overall. She denies any chest pain, fevers, chills, or night sweats. She explains she has fluctuating weight change depending on her intake. She  reports nasal congestion, shortness of breath with mild exertion, and a cough that began last week occasionally productive with thick yellow sputum. She denies hemoptysis. She denies any bowel disturbances, and denies abdominal pain, nausea or vomiting. She reports urinary incontinence. She reports whole body aching, fatigue, and weakness related to neuropathy. A complete review of systems is obtained and is otherwise negative.    PHYSICAL EXAM:  Wt Readings from Last 3 Encounters:  08/04/17 153 lb 6.4 oz (69.6 kg)  07/29/17 155 lb 1.6 oz (70.4 kg)  07/17/17 156 lb 8 oz (71 kg)   Temp Readings from Last 3 Encounters:  08/04/17 98.6 F (37 C) (Oral)  07/29/17 98 F (36.7 C) (Oral)  07/17/17 98.2 F (36.8 C) (Oral)   BP Readings from Last 3 Encounters:  08/04/17 (!) 107/57  08/04/17 136/71  07/29/17 (!) 121/59   Pulse Readings from Last 3 Encounters:  08/04/17 76  08/04/17 74  07/29/17 83   Pain Assessment Pain Score:  (ache all over related to effects of neuropathy)/10  In general this is a well appearing Caucasian woman in no acute distress. She is alert and oriented x4 and appropriate throughout the examination. Cardiovascular exam reveals a regular rate and rhythm, no clicks rubs or murmurs are auscultated. Chest is clear to auscultation bilaterally. Lymphatic assessment is performed and does not reveal any adenopathy in the cervical, supraclavicular, axillary, or inguinal chains.   KPS = 80  100 - Normal; no complaints; no evidence of disease. 90   - Able to carry on normal activity; minor  signs or symptoms of disease. 80   - Normal activity with effort; some signs or symptoms of disease. 86   - Cares for self; unable to carry on normal activity or to do active work. 60   - Requires occasional assistance, but is able to care for most of his personal needs. 50   - Requires considerable assistance and frequent medical care. 22   - Disabled; requires special care and assistance. 80   - Severely disabled; hospital admission is indicated although death not imminent. 30   - Very sick; hospital admission necessary; active supportive treatment necessary. 10   - Moribund; fatal processes progressing rapidly. 0     - Dead  Karnofsky DA, [facility] [place], [place]ver LS and [facility] [phone]) The use of the nitrogen mustards in the palliative treatment of carcinoma: with particular reference to bronchogenic carcinoma Cancer 1 634-56  LABORATORY DATA:  Lab Results  Component Value Date   WBC 3.7 (L) 07/17/2017   HGB 9.8 (L) 07/17/2017   HCT 29.8 (L) 07/17/2017   MCV 106.4 (H) 07/17/2017   PLT 128 (L) 07/17/2017   Lab Results  Component Value Date   NA 138 07/17/2017   K 4.1 07/17/2017   CL 108 07/12/2017   CO2 28 07/17/2017   Lab Results  Component Value Date   ALT 20 07/17/2017   AST 20 07/17/2017   ALKPHOS 67 07/17/2017   BILITOT 0.54 07/17/2017     RADIOGRAPHY: Ct Chest W Contrast  Result Date: 07/25/2017 CLINICAL DATA:  Stage IV squamous cell carcinoma of the right lung. COPD. History of breast cancer. Neuropathy. Shortness of breath. EXAM: CT CHEST, ABDOMEN, AND PELVIS WITH CONTRAST TECHNIQUE: Multidetector CT imaging of the chest, abdomen and pelvis was performed following the standard protocol during bolus administration of intravenous contrast. CONTRAST:  100 mL Isovue 300 COMPARISON:  Multiple exams, including 05/26/2017 FINDINGS: CT CHEST FINDINGS Cardiovascular: Coronary, aortic  arch, and branch vessel atherosclerotic vascular disease. Right Port-A-Cath tip: SVC. Dual lead  pacer noted. Mild cardiomegaly. Mediastinum/Nodes: Prevascular node 1.1 cm in short axis on image 25/2, formerly 1.2 cm. AP window lymph node 1.1 cm in short axis on image 26/2, formerly 1.6 cm. Left axillary clips noted. Lungs/Pleura: The right upper lobe mass 4.4 by 3.7 cm, formerly 3.4 by 2.1 cm. Left upper lobe nodule along or within a left upper lobe airway measures 0.6 by 0.4 cm, formerly 0.8 by 0.5 cm. Centrilobular emphysema. Musculoskeletal: Thoracic spondylosis. CT ABDOMEN PELVIS FINDINGS Hepatobiliary: Intrahepatic and extrahepatic biliary dilatation similar to prior, some of this may represent a physiologic response to cholecystectomy. An obvious cause for distal biliary obstruction is not seen. No focal liver lesion identified. Pancreas: Unremarkable Spleen: Speckled hypodensity in the spleen is probably partially due to early contrast phase are technically nonspecific, this is less striking in the visualized part of the spleen on the delayed images. Adrenals/Urinary Tract: Left kidney upper pole simple appearing cysts 4.9 cm in diameter. An adjacent 1.3 cm cyst in the left mid kidney appears to have a posterior calcification. Adrenal glands normal. Right posterior bladder diverticulum. Stomach/Bowel: Unremarkable Vascular/Lymphatic: Aortoiliac atherosclerotic vascular disease. Reproductive: Uterus absent.  Adnexa unremarkable. Other: No supplemental non-categorized findings. Musculoskeletal: Degenerative arthropathy of both hips. Mild dextroconvex lumbar scoliosis with rotary component. Lumbar spondylosis and degenerative disc disease with impingement on the right at L5-S1 and L4-5. Grade 1 degenerative anterolisthesis at L4-5. IMPRESSION: 1. Mixed appearance with enlargement of the right upper lobe mass but mildly reduced size of the mediastinal adenopathy. Slightly reduced size of the bronchial nodule in the left upper lobe. 2. No findings of metastatic disease to the abdomen or pelvis. 3. Aortic  Atherosclerosis (ICD10-I70.0) and Emphysema (ICD10-J43.9). Coronary atherosclerosis. 4. Stable chronic biliary dilatation. Some of this may be a physiologic response to cholecystectomy. 5. Lumbar scoliosis, spondylosis, and degenerative disc disease, with right-sided impingement at L4-5 and L5-S 1. 6. Left renal cysts have benign imaging characteristics. 7. Faint speckled hypodensity in the spleen, likely benign/incidental. Electronically Signed   By: Van Clines M.D.   On: 07/25/2017 14:58   Ct Abdomen Pelvis W Contrast  Result Date: 07/25/2017 CLINICAL DATA:  Stage IV squamous cell carcinoma of the right lung. COPD. History of breast cancer. Neuropathy. Shortness of breath. EXAM: CT CHEST, ABDOMEN, AND PELVIS WITH CONTRAST TECHNIQUE: Multidetector CT imaging of the chest, abdomen and pelvis was performed following the standard protocol during bolus administration of intravenous contrast. CONTRAST:  100 mL Isovue 300 COMPARISON:  Multiple exams, including 05/26/2017 FINDINGS: CT CHEST FINDINGS Cardiovascular: Coronary, aortic arch, and branch vessel atherosclerotic vascular disease. Right Port-A-Cath tip: SVC. Dual lead pacer noted. Mild cardiomegaly. Mediastinum/Nodes: Prevascular node 1.1 cm in short axis on image 25/2, formerly 1.2 cm. AP window lymph node 1.1 cm in short axis on image 26/2, formerly 1.6 cm. Left axillary clips noted. Lungs/Pleura: The right upper lobe mass 4.4 by 3.7 cm, formerly 3.4 by 2.1 cm. Left upper lobe nodule along or within a left upper lobe airway measures 0.6 by 0.4 cm, formerly 0.8 by 0.5 cm. Centrilobular emphysema. Musculoskeletal: Thoracic spondylosis. CT ABDOMEN PELVIS FINDINGS Hepatobiliary: Intrahepatic and extrahepatic biliary dilatation similar to prior, some of this may represent a physiologic response to cholecystectomy. An obvious cause for distal biliary obstruction is not seen. No focal liver lesion identified. Pancreas: Unremarkable Spleen: Speckled  hypodensity in the spleen is probably partially due to early contrast phase are technically nonspecific, this  is less striking in the visualized part of the spleen on the delayed images. Adrenals/Urinary Tract: Left kidney upper pole simple appearing cysts 4.9 cm in diameter. An adjacent 1.3 cm cyst in the left mid kidney appears to have a posterior calcification. Adrenal glands normal. Right posterior bladder diverticulum. Stomach/Bowel: Unremarkable Vascular/Lymphatic: Aortoiliac atherosclerotic vascular disease. Reproductive: Uterus absent.  Adnexa unremarkable. Other: No supplemental non-categorized findings. Musculoskeletal: Degenerative arthropathy of both hips. Mild dextroconvex lumbar scoliosis with rotary component. Lumbar spondylosis and degenerative disc disease with impingement on the right at L5-S1 and L4-5. Grade 1 degenerative anterolisthesis at L4-5. IMPRESSION: 1. Mixed appearance with enlargement of the right upper lobe mass but mildly reduced size of the mediastinal adenopathy. Slightly reduced size of the bronchial nodule in the left upper lobe. 2. No findings of metastatic disease to the abdomen or pelvis. 3. Aortic Atherosclerosis (ICD10-I70.0) and Emphysema (ICD10-J43.9). Coronary atherosclerosis. 4. Stable chronic biliary dilatation. Some of this may be a physiologic response to cholecystectomy. 5. Lumbar scoliosis, spondylosis, and degenerative disc disease, with right-sided impingement at L4-5 and L5-S 1. 6. Left renal cysts have benign imaging characteristics. 7. Faint speckled hypodensity in the spleen, likely benign/incidental. Electronically Signed   By: Van Clines M.D.   On: 07/25/2017 14:58      IMPRESSION/PLAN: 1. 83 y.o. female with Stage IV, T2bN3M1a, NSCLC, squamous cell carcinoma of the right upper lobe with metastatic disease to the left upper lobe. Today, we talked to the patient and family about the findings and work-up thus far.  We discussed the natural history  of lung cancer and general treatment, highlighting the role of radiotherapy in the management.  We discussed the available radiation techniques, and focused on the details of logistics and delivery.  We reviewed the anticipated acute and late sequelae associated with radiation in this setting.  The patient was encouraged to ask questions that we answered to the best of our ability.  We discussed the risks, benefits, short, and long term effects of radiotherapy, and the patient is interested in proceeding.We discussed the delivery and logistics of radiotherapy and would recommend a course of 10 fractions over 2 weeks. The patient would like to proceed with radiation and is scheduled for CT simulation on Thursday 08/07/17 at 9 AM. 2. Neuropathy secondary to chemotherapy. The patient will meet with neurology as well today.  3. Remote history of left breast cancer. She will continue following up with her PCP and have yearly mammograms.      Carola Rhine, PAC    Tyler Pita, MD  [facility] Direct Dial: [phone]  Fax: [phone] [email or web address]  Skype  [email or web address]   This document serves as a record of services personally performed by Tyler Pita, MD and Shona Simpson, PA-C. It was created on their behalf by Rae Lips, a trained medical scribe. The creation of this record is based on the scribe's personal observations and the providers' statements to them. This document has been checked and approved by the attending providers.

## 2017-08-04 NOTE — Progress Notes (Signed)
See progress note under physician encounter. 

## 2017-08-04 NOTE — Patient Instructions (Signed)
Remember to drink plenty of fluid, eat healthy meals and do not skip any meals. Try to eat protein with a every meal and eat a healthy snack such as fruit or nuts in between meals. Try to keep a regular sleep-wake schedule and try to exercise daily, particularly in the form of walking, 20-30 minutes a day, if you can.   As far as your medications are concerned, I would like to suggest: Increase Neurontin to 600mg  three times a day  I would like to see you back in 3 months, sooner if we need to. Please call us with any interim questions, concerns, problems, updates or refill requests.   Our phone number is [phone]. We also have an after hours call service for urgent matters and there is a physician on-call for urgent questions. For any emergencies you know to call 911 or go to the nearest emergency room  Gabapentin capsules or tablets What is this medicine? GABAPENTIN (GA ba pen tin) is used to control partial seizures in adults with epilepsy. It is also used to treat certain types of nerve pain. This medicine may be used for other purposes; ask your health care provider or pharmacist if you have questions. COMMON BRAND NAME(S): Active-PAC with Gabapentin, Gabarone, Neurontin What should I tell my health care provider before I take this medicine? They need to know if you have any of these conditions: -kidney disease -suicidal thoughts, plans, or attempt; a previous suicide attempt by you or a family member -an unusual or allergic reaction to gabapentin, other medicines, foods, dyes, or preservatives -pregnant or trying to get pregnant -breast-feeding How should I use this medicine? Take this medicine by mouth with a glass of water. Follow the directions on the prescription label. You can take it with or without food. If it upsets your stomach, take it with food.Take your medicine at regular intervals. Do not take it more often than directed. Do not stop taking except on your doctor's  advice. If you are directed to break the 600 or 800 mg tablets in half as part of your dose, the extra half tablet should be used for the next dose. If you have not used the extra half tablet within 28 days, it should be thrown away. A special MedGuide will be given to you by the pharmacist with each prescription and refill. Be sure to read this information carefully each time. Talk to your pediatrician regarding the use of this medicine in children. Special care may be needed. Overdosage: If you think you have taken too much of this medicine contact a poison control center or emergency room at once. NOTE: This medicine is only for you. Do not share this medicine with others. What if I miss a dose? If you miss a dose, take it as soon as you can. If it is almost time for your next dose, take only that dose. Do not take double or extra doses. What may interact with this medicine? Do not take this medicine with any of the following medications: -other gabapentin products This medicine may also interact with the following medications: -alcohol -antacids -antihistamines for allergy, cough and cold -certain medicines for anxiety or sleep -certain medicines for depression or psychotic disturbances -homatropine; hydrocodone -naproxen -narcotic medicines (opiates) for pain -phenothiazines like chlorpromazine, mesoridazine, prochlorperazine, thioridazine This list may not describe all possible interactions. Give your health care provider a list of all the medicines, herbs, non-prescription drugs, or dietary supplements you use. Also tell them if you smoke,  drink alcohol, or use illegal drugs. Some items may interact with your medicine. What should I watch for while using this medicine? Visit your doctor or health care professional for regular checks on your progress. You may want to keep a record at home of how you feel your condition is responding to treatment. You may want to share this information  with your doctor or health care professional at each visit. You should contact your doctor or health care professional if your seizures get worse or if you have any new types of seizures. Do not stop taking this medicine or any of your seizure medicines unless instructed by your doctor or health care professional. Stopping your medicine suddenly can increase your seizures or their severity. Wear a medical identification bracelet or chain if you are taking this medicine for seizures, and carry a card that lists all your medications. You may get drowsy, dizzy, or have blurred vision. Do not drive, use machinery, or do anything that needs mental alertness until you know how this medicine affects you. To reduce dizzy or fainting spells, do not sit or stand up quickly, especially if you are an older patient. Alcohol can increase drowsiness and dizziness. Avoid alcoholic drinks. Your mouth may get dry. Chewing sugarless gum or sucking hard candy, and drinking plenty of water will help. The use of this medicine may increase the chance of suicidal thoughts or actions. Pay special attention to how you are responding while on this medicine. Any worsening of mood, or thoughts of suicide or dying should be reported to your health care professional right away. Women who become pregnant while using this medicine may enroll in the [place] Pregnancy Registry by calling 4302108789. This registry collects information about the safety of antiepileptic drug use during pregnancy. What side effects may I notice from receiving this medicine? Side effects that you should report to your doctor or health care professional as soon as possible: -allergic reactions like skin rash, itching or hives, swelling of the face, lips, or tongue -worsening of mood, thoughts or actions of suicide or dying Side effects that usually do not require medical attention (report to your doctor or health care professional if  they continue or are bothersome): -constipation -difficulty walking or controlling muscle movements -dizziness -nausea -slurred speech -tiredness -tremors -weight gain This list may not describe all possible side effects. Call your doctor for medical advice about side effects. You may report side effects to FDA at 1-800-FDA-1088. Where should I keep my medicine? Keep out of reach of children. This medicine may cause accidental overdose and death if it taken by other adults, children, or pets. Mix any unused medicine with a substance like cat litter or coffee grounds. Then throw the medicine away in a sealed container like a sealed bag or a coffee can with a lid. Do not use the medicine after the expiration date. Store at room temperature between 15 and 30 degrees C (59 and 86 degrees F). NOTE: This sheet is a summary. It may not cover all possible information. If you have questions about this medicine, talk to your doctor, pharmacist, or health care provider.  2018 Elsevier/Gold Standard (2014-01-21 15:26:50)

## 2017-08-05 ENCOUNTER — Telehealth: Payer: Self-pay | Admitting: Neurology

## 2017-08-05 ENCOUNTER — Other Ambulatory Visit: Payer: Self-pay | Admitting: Medical Oncology

## 2017-08-05 ENCOUNTER — Telehealth: Payer: Self-pay

## 2017-08-05 DIAGNOSIS — G609 Hereditary and idiopathic neuropathy, unspecified: Secondary | ICD-10-CM

## 2017-08-05 DIAGNOSIS — C3491 Malignant neoplasm of unspecified part of right bronchus or lung: Secondary | ICD-10-CM

## 2017-08-05 MED ORDER — GABAPENTIN 300 MG PO CAPS: 600.0000 mg | ORAL_CAPSULE | Freq: Three times a day (TID) | ORAL | 6 refills | Status: DC

## 2017-08-05 MED ORDER — OXYCODONE-ACETAMINOPHEN 5-325 MG PO TABS: 1.0000 | ORAL_TABLET | Freq: Four times a day (QID) | ORAL | 0 refills | Status: DC | PRN

## 2017-08-05 NOTE — Telephone Encounter (Signed)
Pt saw Dr Jaynee Eagles, neurologist, yesterday. She updated gabapentin to 300mg , take 2 tabs BID then increase to TID if not working enough. Dr Jaynee Eagles would like [place] to draw a B12 with next lab draw.   Requested  refill for oxycodone. ( if gabapentin doesn't work Dr Jaynee Eagles suggests 1 tab QID) she is taking 2-3 times per day at present, would it be advisable to increase quantity on rx?  Feeling nausea today, her compazine does help.  She fell again yesterday which is 3rd time in 17 days. She is having a harder time using her walker. She has a bruise on her R arm with a small cut. When turning to back up and sit in a chair, her body turned but her feet did not move.  The family will have someone with her at all times.

## 2017-08-05 NOTE — Telephone Encounter (Signed)
E-scribed rx again to pt pharmacy. Receipt confirmed by pharmacy per EPIC.

## 2017-08-05 NOTE — Telephone Encounter (Signed)
Patients daughter called and requested to speak with the nurse regarding an rx for Gabapentin that was supposed to be sent over to [facility] on [address]. I told her that I would have the nurse send the RX over to the pharmacy and informed her that unless there were any problems she would not hear from [place]. She stated that this was ok. No need to return call.

## 2017-08-05 NOTE — Progress Notes (Signed)
Request received from [facility] for cardiology clearance for Pt radiation d/t Pt with pacemaker.  Per review of Pt chart, Pt not seen by Dr. Lovena Le since 2013.  Pt followed by Dr. Wynonia Lawman.  Left message stating request should be sent to Dr. Thurman Coyer office.  Left this nurse name and # if any questions.

## 2017-08-06 DIAGNOSIS — G620 Drug-induced polyneuropathy: Secondary | ICD-10-CM | POA: Insufficient documentation

## 2017-08-06 DIAGNOSIS — T451X5A Adverse effect of antineoplastic and immunosuppressive drugs, initial encounter: Secondary | ICD-10-CM

## 2017-08-06 NOTE — Progress Notes (Signed)
  Radiation Oncology         ([phone]) [phone] ________________________________  Name: Maria Washington MRN: [id number]  Date: 08/07/2017  DOB: 12/19/1928  SIMULATION AND TREATMENT PLANNING NOTE    ICD-10-CM   1. Stage IV squamous cell carcinoma of right lung (HCC) C34.91     DIAGNOSIS:  83 y.o. woman with Stage IV, T2bN3M1a, NSCLC, squamous cell carcinoma of the right upper lobe  NARRATIVE:  The patient was brought to the [facility].  Identity was confirmed.  All relevant records and images related to the planned course of therapy were reviewed.  The patient freely provided informed written consent to proceed with treatment after reviewing the details related to the planned course of therapy. The consent form was witnessed and verified by the simulation staff.  Then, the patient was set-up in a stable reproducible  supine position for radiation therapy.  CT images were obtained.  Surface markings were placed.  The CT images were loaded into the planning software.  Then the target and avoidance structures were contoured.  Treatment planning then occurred.  The radiation prescription was entered and confirmed.  Then, I designed and supervised the construction of a total of 6 medically necessary complex treatment devices, including a BodyFix immobilization mold custom fitted to the patient along with 5 multileaf collimators conformally shaped radiation around the treatment target while shielding critical structures such as the heart and spinal cord maximally.  I have requested : 3D Simulation  I have requested a DVH of the following structures: Left lung, right lung, spinal cord, heart, esophagus, and target.  I have ordered:Nutrition Consult  SPECIAL TREATMENT PROCEDURE:  The planned course of therapy using radiation constitutes a special treatment procedure. Special care is required in the management of this patient for the following reasons.  The patient will be receiving concurrent  chemotherapy requiring careful monitoring for increased toxicities of treatment including periodic laboratory values.  The special nature of the planned course of radiotherapy will require increased physician supervision and oversight to ensure patient's safety with optimal treatment outcomes.  PLAN:  The patient will receive 66 Gy in 33 fractions.  ________________________________  Sheral Apley Tammi Klippel, M.D.  This document serves as a record of services personally performed by Tyler Pita, MD. It was created on his behalf by Arlyce Harman, a trained medical scribe. The creation of this record is based on the scribe's personal observations and the provider's statements to them. This document has been checked and approved by the attending provider.

## 2017-08-07 ENCOUNTER — Ambulatory Visit
Admission: RE | Admit: 2017-08-07 | Discharge: 2017-08-07 | Disposition: A | Discharge status: Home / Self Care | Payer: Medicare Other | Source: Ambulatory Visit | Attending: Radiation Oncology | Admitting: Radiation Oncology

## 2017-08-07 DIAGNOSIS — Z510 Encounter for antineoplastic radiation therapy: Secondary | ICD-10-CM | POA: Diagnosis not present

## 2017-08-07 DIAGNOSIS — C3491 Malignant neoplasm of unspecified part of right bronchus or lung: Secondary | ICD-10-CM

## 2017-08-13 ENCOUNTER — Telehealth: Payer: Self-pay | Admitting: Medical Oncology

## 2017-08-13 NOTE — Telephone Encounter (Signed)
Coughing up pink tinged sputum "getting darker every time she coughs, 3 times today . ". Per Julien Nordmann I instructed pt family member  to contact XRT ( she has already placed a call to XRT-tx starts next week)  and to monitor for now. She was also instructed to go to ED for any persistent hemoptysis , acute onset of SOB, chest pain and/or  increase in amount.

## 2017-08-14 ENCOUNTER — Telehealth: Payer: Self-pay | Admitting: Radiation Oncology

## 2017-08-14 NOTE — Telephone Encounter (Signed)
Phoned Edmonia Lynch with [employer]. Informed her that Dr. Wynonia Lawman request the patient's pacemaker be monitored before during and after radiation treatment. Provided her with dates and times of patient's treatment appointments. She verbalized understanding and confirms someone would be present from [facility] for monitoring.

## 2017-08-14 NOTE — Telephone Encounter (Signed)
Returned call to patient's daughter, Lucita Ferrara. Explained no intervention is needed at this time reference return of hemoptysis. Explained radiation treatment appointment for Monday and subsequent appointments needed to be kept since there is an 80% chance the hemoptysis will resolve with the radiation. Stressed that if dyspnea, chest pain, fever, worsening cough or she began coughing up clots to present to the emergency room. Lucita Ferrara verbalized understanding and appreciation for the return call.

## 2017-08-14 NOTE — Telephone Encounter (Signed)
-----   Message from Tyler Pita, MD sent at 08/13/2017  3:39 PM EDT ----- Regarding: RE: Hemoptysis  Contact: [phone] Agree, proceeding with radiation would likely stop hemoptysis from tumor (>80% response rate for hemoptysis)  Since sometimes blood indicates bronchitis, I would tell her to call if fever or cough worsens for Abx   ----- Message ----- From: Heywood Footman, RN Sent: 08/13/2017   3:24 PM To: Tyler Pita, MD, [name], PA-C Subject: Hemoptysis                                     Maria Washington.  This patient's daughter called today to inform our staff that her mother began coughing up blood again today. She is scheduled for a port and treatment on L1 on Monday, September 10th.   83 y.o. woman with Stage IV, T2bN3M1a, NSCLC, squamous cell carcinoma of the right upper lobe  The patient will receive 66 Gy in 33 fractions.  I am assuming I should tell her daughter to just plan to keep the appointment for Monday and that no other intervention is needed at this time. Plus, advise to present to the ED should she begin spitting up clots of experiencing dyspnea?  Sam

## 2017-08-15 DIAGNOSIS — Z510 Encounter for antineoplastic radiation therapy: Secondary | ICD-10-CM | POA: Diagnosis not present

## 2017-08-18 ENCOUNTER — Ambulatory Visit
Admission: RE | Admit: 2017-08-18 | Discharge: 2017-08-18 | Disposition: A | Discharge status: Home / Self Care | Payer: Medicare Other | Source: Ambulatory Visit | Attending: Radiation Oncology | Admitting: Radiation Oncology

## 2017-08-18 DIAGNOSIS — Z510 Encounter for antineoplastic radiation therapy: Secondary | ICD-10-CM | POA: Diagnosis not present

## 2017-08-19 ENCOUNTER — Ambulatory Visit
Admission: RE | Admit: 2017-08-19 | Discharge: 2017-08-19 | Disposition: A | Discharge status: Home / Self Care | Payer: Medicare Other | Source: Ambulatory Visit | Attending: Internal Medicine | Admitting: Internal Medicine

## 2017-08-19 ENCOUNTER — Ambulatory Visit
Admission: RE | Admit: 2017-08-19 | Discharge: 2017-08-19 | Disposition: A | Discharge status: Home / Self Care | Payer: Medicare Other | Source: Ambulatory Visit | Attending: Radiation Oncology | Admitting: Radiation Oncology

## 2017-08-19 ENCOUNTER — Other Ambulatory Visit: Payer: Self-pay | Admitting: Internal Medicine

## 2017-08-19 DIAGNOSIS — Z510 Encounter for antineoplastic radiation therapy: Secondary | ICD-10-CM | POA: Diagnosis not present

## 2017-08-19 DIAGNOSIS — S6992XA Unspecified injury of left wrist, hand and finger(s), initial encounter: Secondary | ICD-10-CM

## 2017-08-20 ENCOUNTER — Ambulatory Visit
Admission: RE | Admit: 2017-08-20 | Discharge: 2017-08-20 | Disposition: A | Discharge status: Home / Self Care | Payer: Medicare Other | Source: Ambulatory Visit | Attending: Radiation Oncology | Admitting: Radiation Oncology

## 2017-08-20 DIAGNOSIS — Z510 Encounter for antineoplastic radiation therapy: Secondary | ICD-10-CM | POA: Diagnosis not present

## 2017-08-21 ENCOUNTER — Ambulatory Visit
Admission: RE | Admit: 2017-08-21 | Discharge: 2017-08-21 | Disposition: A | Discharge status: Home / Self Care | Payer: Medicare Other | Source: Ambulatory Visit | Attending: Radiation Oncology | Admitting: Radiation Oncology

## 2017-08-21 DIAGNOSIS — Z510 Encounter for antineoplastic radiation therapy: Secondary | ICD-10-CM | POA: Diagnosis not present

## 2017-08-22 ENCOUNTER — Ambulatory Visit
Admission: RE | Admit: 2017-08-22 | Discharge: 2017-08-22 | Disposition: A | Discharge status: Home / Self Care | Payer: Medicare Other | Source: Ambulatory Visit | Attending: Radiation Oncology | Admitting: Radiation Oncology

## 2017-08-22 ENCOUNTER — Ambulatory Visit: Payer: Medicare Other | Admitting: Radiation Oncology

## 2017-08-22 DIAGNOSIS — Z510 Encounter for antineoplastic radiation therapy: Secondary | ICD-10-CM | POA: Diagnosis not present

## 2017-08-25 ENCOUNTER — Ambulatory Visit
Admission: RE | Admit: 2017-08-25 | Discharge: 2017-08-25 | Disposition: A | Discharge status: Home / Self Care | Payer: Medicare Other | Source: Ambulatory Visit | Attending: Radiation Oncology | Admitting: Radiation Oncology

## 2017-08-25 DIAGNOSIS — Z510 Encounter for antineoplastic radiation therapy: Secondary | ICD-10-CM | POA: Diagnosis not present

## 2017-08-26 ENCOUNTER — Ambulatory Visit
Admission: RE | Admit: 2017-08-26 | Discharge: 2017-08-26 | Disposition: A | Discharge status: Home / Self Care | Payer: Medicare Other | Source: Ambulatory Visit | Attending: Radiation Oncology | Admitting: Radiation Oncology

## 2017-08-26 ENCOUNTER — Other Ambulatory Visit: Payer: Self-pay | Admitting: Medical Oncology

## 2017-08-26 ENCOUNTER — Ambulatory Visit: Payer: Medicare Other | Admitting: Radiation Oncology

## 2017-08-26 DIAGNOSIS — C3491 Malignant neoplasm of unspecified part of right bronchus or lung: Secondary | ICD-10-CM

## 2017-08-26 DIAGNOSIS — Z510 Encounter for antineoplastic radiation therapy: Secondary | ICD-10-CM | POA: Diagnosis not present

## 2017-08-26 MED ORDER — OXYCODONE-ACETAMINOPHEN 5-325 MG PO TABS: 1.0000 | ORAL_TABLET | Freq: Four times a day (QID) | ORAL | 0 refills | Status: DC | PRN

## 2017-08-27 ENCOUNTER — Ambulatory Visit
Admission: RE | Admit: 2017-08-27 | Discharge: 2017-08-27 | Disposition: A | Discharge status: Home / Self Care | Payer: Medicare Other | Source: Ambulatory Visit | Attending: Radiation Oncology | Admitting: Radiation Oncology

## 2017-08-27 DIAGNOSIS — Z510 Encounter for antineoplastic radiation therapy: Secondary | ICD-10-CM | POA: Diagnosis not present

## 2017-08-28 ENCOUNTER — Ambulatory Visit
Admission: RE | Admit: 2017-08-28 | Discharge: 2017-08-28 | Disposition: A | Discharge status: Home / Self Care | Payer: Medicare Other | Source: Ambulatory Visit | Attending: Radiation Oncology | Admitting: Radiation Oncology

## 2017-08-28 DIAGNOSIS — Z510 Encounter for antineoplastic radiation therapy: Secondary | ICD-10-CM | POA: Diagnosis not present

## 2017-08-29 ENCOUNTER — Ambulatory Visit
Admission: RE | Admit: 2017-08-29 | Discharge: 2017-08-29 | Disposition: A | Discharge status: Home / Self Care | Payer: Medicare Other | Source: Ambulatory Visit | Attending: Radiation Oncology | Admitting: Radiation Oncology

## 2017-08-29 ENCOUNTER — Ambulatory Visit: Payer: Medicare Other | Admitting: Radiation Oncology

## 2017-08-29 DIAGNOSIS — Z510 Encounter for antineoplastic radiation therapy: Secondary | ICD-10-CM | POA: Diagnosis not present

## 2017-09-01 ENCOUNTER — Ambulatory Visit
Admission: RE | Admit: 2017-09-01 | Discharge: 2017-09-01 | Disposition: A | Discharge status: Home / Self Care | Payer: Medicare Other | Source: Ambulatory Visit | Attending: Radiation Oncology | Admitting: Radiation Oncology

## 2017-09-01 DIAGNOSIS — C3491 Malignant neoplasm of unspecified part of right bronchus or lung: Secondary | ICD-10-CM

## 2017-09-01 DIAGNOSIS — Z510 Encounter for antineoplastic radiation therapy: Secondary | ICD-10-CM | POA: Diagnosis not present

## 2017-09-01 MED ORDER — SONAFINE EX EMUL
1.0000 "application " | Freq: Two times a day (BID) | CUTANEOUS | Status: DC
  Administered 2017-09-01: 1 via TOPICAL

## 2017-09-02 ENCOUNTER — Ambulatory Visit
Admission: RE | Admit: 2017-09-02 | Discharge: 2017-09-02 | Disposition: A | Discharge status: Home / Self Care | Payer: Medicare Other | Source: Ambulatory Visit | Attending: Radiation Oncology | Admitting: Radiation Oncology

## 2017-09-02 DIAGNOSIS — Z510 Encounter for antineoplastic radiation therapy: Secondary | ICD-10-CM | POA: Diagnosis not present

## 2017-09-03 ENCOUNTER — Other Ambulatory Visit: Payer: Self-pay | Admitting: Radiation Oncology

## 2017-09-03 ENCOUNTER — Ambulatory Visit
Admission: RE | Admit: 2017-09-03 | Discharge: 2017-09-03 | Disposition: A | Discharge status: Home / Self Care | Payer: Medicare Other | Source: Ambulatory Visit | Attending: Radiation Oncology | Admitting: Radiation Oncology

## 2017-09-03 DIAGNOSIS — Z510 Encounter for antineoplastic radiation therapy: Secondary | ICD-10-CM | POA: Diagnosis not present

## 2017-09-03 DIAGNOSIS — C3491 Malignant neoplasm of unspecified part of right bronchus or lung: Secondary | ICD-10-CM

## 2017-09-03 MED ORDER — SUCRALFATE 1 G PO TABS: 1.0000 g | ORAL_TABLET | Freq: Three times a day (TID) | ORAL | 2 refills | Status: DC

## 2017-09-04 ENCOUNTER — Ambulatory Visit
Admission: RE | Admit: 2017-09-04 | Discharge: 2017-09-04 | Disposition: A | Discharge status: Home / Self Care | Payer: Medicare Other | Source: Ambulatory Visit | Attending: Radiation Oncology | Admitting: Radiation Oncology

## 2017-09-04 ENCOUNTER — Telehealth: Payer: Self-pay | Admitting: Neurology

## 2017-09-04 DIAGNOSIS — Z510 Encounter for antineoplastic radiation therapy: Secondary | ICD-10-CM | POA: Diagnosis not present

## 2017-09-04 NOTE — Telephone Encounter (Signed)
Pt daughter(on DPR) calling to inform that the gabapentin (NEURONTIN) 300 MG capsule is not working and pt feels no different, please call

## 2017-09-05 ENCOUNTER — Other Ambulatory Visit: Payer: Self-pay | Admitting: Internal Medicine

## 2017-09-05 ENCOUNTER — Encounter: Payer: Self-pay | Admitting: Radiation Oncology

## 2017-09-05 NOTE — Telephone Encounter (Signed)
I would decrease by one pill every 2-3 days and stop. thanks

## 2017-09-05 NOTE — Telephone Encounter (Signed)
Spoke to daughter, Lucita Ferrara.  Pt has been taking the gabapentin for 2.5 months.  She has noted SE of agitation, sme dizziness/lightheadedness/headache.  It is not made any difference in regards to the pain of neuropathy.  Your note states trying lyrica.  Please advise.  If changing therapy, how to come off gabapentin.

## 2017-09-05 NOTE — Telephone Encounter (Signed)
If she is agitated I would decrease the neurontin. Patient should be seen at a pain clinic to be managed for pain.

## 2017-09-05 NOTE — Progress Notes (Addendum)
°  Radiation Oncology         [phone]) [phone] ________________________________  Name: Maria Washington MRN: [id number]  Date: 09/05/2017  DOB: 05-26-1929  End of Treatment Note  Diagnosis:   Stage IV, [id number], NSCLC, squamous cell carcinoma of the right upper lobe     Indication for treatment:  Palliative       Radiation treatment dates:   08/18/17 - 09/04/17  Site/dose:   Lung_Rt_UL/ 2.5Gy X 14 fractions  Beams/energy:   3D/ 6X, 10X  Narrative: The patient tolerated radiation treatment relatively well. By the end of treatment, pt reported increased fatigue and intermittent lightheadedness when turning her head. Pt endorsed pain in her throat when swallowing and a decreased appetite. Pt denied any skin issues throughout.   Plan: The patient has completed radiation treatment. The patient will return to radiation oncology clinic for routine followup in one month. I advised her to call or return sooner if she has any questions or concerns related to her recovery or treatment. ________________________________  Sheral Apley. Tammi Klippel, M.D.    This document serves as a record of services personally performed by Tyler Pita, MD. It was created on his behalf by Margit Banda, a trained medical scribe. The creation of this record is based on the scribe's personal observations and the provider's statements to them. This document has been checked and approved by the attending provider.

## 2017-09-08 NOTE — Telephone Encounter (Addendum)
I spoke to Maria Washington daughter to pt.  Pt is having numbness, walking an issue. not pain.  Will come off the neurontin for pain due to having agitation. (1 cap every 2-3 days until off).  I relayed that she may have pain restart after coming off the gabapentin.  She does not have MD for PM.  I told her I would get clarification if pain comes back if prescribed something else.  Can anything be done for numbness?  Do we refer to pain management?

## 2017-09-08 NOTE — Telephone Encounter (Signed)
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 She may need an increase in her pain medication which I do not prescribe, she should be seen at a pain clinic. Her primary care may need to refer her, will cc Dr. Felipa Eth on this thanks

## 2017-09-08 NOTE — Telephone Encounter (Signed)
I spoke to [name], daughter.  Pt is seeing Dr. Earlie Server for her pain.  Dr. Jaynee Eagles copied Dr. Felipa Eth with note as well.  I wanted to let her know as soon as I knew something as she was coming off gabapentin.  She states would contact Dr. Julien Nordmann.  She verbalized understanding.

## 2017-09-09 NOTE — Telephone Encounter (Signed)
Val from Dr. Carlyle Lipa office is calling to discuss patient tapering off gabapentin (NEURONTIN) 300 MG capsule and possibly trying Lyrica but wants to know if Duloxetine is an option for the patient? She says you can call her back or contact the patient.

## 2017-09-10 NOTE — Telephone Encounter (Signed)
Please call patient. We can try Lyrica 50mg  twice daily. We can slowly increase as tolerated every few weeks if she lets [place] know how she is doing. We see her back in November for follow up but she can also discuss pain management with Dr Earlie Server as well, we will try the lyrica thanks.

## 2017-09-11 NOTE — Telephone Encounter (Signed)
I called pt. I explained that Dr. Jaynee Eagles recommends trying lyrica for her with a gradual titration upwards depending on pt's tolerance and response to lyrica. Pt reports that she is not in any pain, she just wants her neuropathy to "go away". I explained that neuropathy will not go away, but we can treat it symptomatically. I offered pt a sooner appt with Dr. Jaynee Eagles to discuss further, and pt accepted an appt of 09/29/2017 at 11:00am. Pt verbalized understanding of new appt date and time. Will keep November appt in the event that it is needed after October appt. Pt verbalized understanding.

## 2017-09-16 ENCOUNTER — Other Ambulatory Visit: Payer: Self-pay | Admitting: Medical Oncology

## 2017-09-16 ENCOUNTER — Other Ambulatory Visit: Payer: Medicare Other

## 2017-09-16 ENCOUNTER — Telehealth: Payer: Self-pay | Admitting: Medical Oncology

## 2017-09-16 ENCOUNTER — Ambulatory Visit: Payer: Medicare Other | Admitting: Medical

## 2017-09-16 DIAGNOSIS — C3491 Malignant neoplasm of unspecified part of right bronchus or lung: Secondary | ICD-10-CM

## 2017-09-16 NOTE — Telephone Encounter (Signed)
For the past 3 days pt has reported worsening SOB, weakness and lightheaded and body aches. BP at home 87/53. I gave appt for [facility] tomorrow and told daughter to take pt to ED if symptoms worsen.

## 2017-09-17 ENCOUNTER — Other Ambulatory Visit (HOSPITAL_BASED_OUTPATIENT_CLINIC_OR_DEPARTMENT_OTHER): Payer: Medicare Other

## 2017-09-17 ENCOUNTER — Ambulatory Visit (HOSPITAL_BASED_OUTPATIENT_CLINIC_OR_DEPARTMENT_OTHER): Payer: Medicare Other | Admitting: Medical

## 2017-09-17 ENCOUNTER — Telehealth: Payer: Self-pay | Admitting: Medical

## 2017-09-17 VITALS — BP 126/72 | HR 99 | Temp 99.1°F | Resp 18 | Ht 60.0 in

## 2017-09-17 DIAGNOSIS — C3491 Malignant neoplasm of unspecified part of right bronchus or lung: Secondary | ICD-10-CM

## 2017-09-17 DIAGNOSIS — R739 Hyperglycemia, unspecified: Secondary | ICD-10-CM

## 2017-09-17 DIAGNOSIS — Z452 Encounter for adjustment and management of vascular access device: Secondary | ICD-10-CM

## 2017-09-17 DIAGNOSIS — R630 Anorexia: Secondary | ICD-10-CM

## 2017-09-17 DIAGNOSIS — R0602 Shortness of breath: Secondary | ICD-10-CM

## 2017-09-17 DIAGNOSIS — R634 Abnormal weight loss: Secondary | ICD-10-CM | POA: Diagnosis not present

## 2017-09-17 LAB — COMPREHENSIVE METABOLIC PANEL
ALT: 15 U/L (ref 0–55)
ANION GAP: 11 meq/L (ref 3–11)
AST: 20 U/L (ref 5–34)
Albumin: 3.5 g/dL (ref 3.5–5.0)
Alkaline Phosphatase: 84 U/L (ref 40–150)
BUN: 20.3 mg/dL (ref 7.0–26.0)
CHLORIDE: 102 meq/L (ref 98–109)
CO2: 23 meq/L (ref 22–29)
Calcium: 10.2 mg/dL (ref 8.4–10.4)
Creatinine: 1 mg/dL (ref 0.6–1.1)
EGFR: 52 mL/min/{1.73_m2} — ABNORMAL LOW (ref >60)
GLUCOSE: 166 mg/dL — AB (ref 70–140)
Potassium: 4.4 mEq/L (ref 3.5–5.1)
SODIUM: 137 meq/L (ref 136–145)
Total Bilirubin: 0.82 mg/dL (ref 0.20–1.20)
Total Protein: 7.8 g/dL (ref 6.4–8.3)

## 2017-09-17 LAB — CBC WITH DIFFERENTIAL/PLATELET
BASO%: 0.5 % (ref 0.0–2.0)
Basophils Absolute: 0 10*3/uL (ref 0.0–0.1)
EOS%: 2.4 % (ref 0.0–7.0)
Eosinophils Absolute: 0.1 10*3/uL (ref 0.0–0.5)
HCT: 36.6 % (ref 34.8–46.6)
HGB: 12.1 g/dL (ref 11.6–15.9)
LYMPH%: 9.2 % — AB (ref 14.0–49.7)
MCH: 32 pg (ref 25.1–34.0)
MCHC: 33 g/dL (ref 31.5–36.0)
MCV: 97.1 fL (ref 79.5–101.0)
MONO#: 0.6 10*3/uL (ref 0.1–0.9)
MONO%: 13.2 % (ref 0.0–14.0)
NEUT%: 74.7 % (ref 38.4–76.8)
NEUTROS ABS: 3.5 10*3/uL (ref 1.5–6.5)
PLATELETS: 152 10*3/uL (ref 145–400)
RBC: 3.77 10*6/uL (ref 3.70–5.45)
RDW: 16.2 % — ABNORMAL HIGH (ref 11.2–14.5)
WBC: 4.7 10*3/uL (ref 3.9–10.3)
lymph#: 0.4 10*3/uL — ABNORMAL LOW (ref 0.9–3.3)

## 2017-09-17 MED ORDER — HEPARIN SOD (PORK) LOCK FLUSH 100 UNIT/ML IV SOLN
500.0000 [IU] | Freq: Once | INTRAVENOUS | Status: AC
  Administered 2017-09-17: 500 [IU] via INTRAVENOUS
  Filled 2017-09-17: qty 5

## 2017-09-17 MED ORDER — OXYCODONE-ACETAMINOPHEN 5-325 MG PO TABS: 1.0000 | ORAL_TABLET | Freq: Four times a day (QID) | ORAL | 0 refills | Status: DC | PRN

## 2017-09-17 MED ORDER — SODIUM CHLORIDE 0.9% FLUSH
10.0000 mL | Freq: Once | INTRAVENOUS | Status: AC
  Administered 2017-09-17: 10 mL via INTRAVENOUS
  Filled 2017-09-17: qty 10

## 2017-09-17 NOTE — Telephone Encounter (Signed)
Per 10/10 los- no los at check out

## 2017-09-17 NOTE — Patient Instructions (Signed)
Implanted Port Home Guide An implanted port is a type of central line that is placed under the skin. Central lines are used to provide IV access when treatment or nutrition needs to be given through a person's veins. Implanted ports are used for long-term IV access. An implanted port may be placed because:  You need IV medicine that would be irritating to the small veins in your hands or arms.  You need long-term IV medicines, such as antibiotics.  You need IV nutrition for a long period.  You need frequent blood draws for lab tests.  You need dialysis.  Implanted ports are usually placed in the chest area, but they can also be placed in the upper arm, the abdomen, or the leg. An implanted port has two main parts:  Reservoir. The reservoir is round and will appear as a small, raised area under your skin. The reservoir is the part where a needle is inserted to give medicines or draw blood.  Catheter. The catheter is a thin, flexible tube that extends from the reservoir. The catheter is placed into a large vein. Medicine that is inserted into the reservoir goes into the catheter and then into the vein.  How will I care for my incision site? Do not get the incision site wet. Bathe or shower as directed by your health care provider. How is my port accessed? Special steps must be taken to access the port:  Before the port is accessed, a numbing cream can be placed on the skin. This helps numb the skin over the port site.  Your health care provider uses a sterile technique to access the port. ? Your health care provider must put on a mask and sterile gloves. ? The skin over your port is cleaned carefully with an antiseptic and allowed to dry. ? The port is gently pinched between sterile gloves, and a needle is inserted into the port.  Only "non-coring" port needles should be used to access the port. Once the port is accessed, a blood return should be checked. This helps ensure that the port  is in the vein and is not clogged.  If your port needs to remain accessed for a constant infusion, a clear (transparent) bandage will be placed over the needle site. The bandage and needle will need to be changed every week, or as directed by your health care provider.  Keep the bandage covering the needle clean and dry. Do not get it wet. Follow your health care provider's instructions on how to take a shower or bath while the port is accessed.  If your port does not need to stay accessed, no bandage is needed over the port.  What is flushing? Flushing helps keep the port from getting clogged. Follow your health care provider's instructions on how and when to flush the port. Ports are usually flushed with saline solution or a medicine called heparin. The need for flushing will depend on how the port is used.  If the port is used for intermittent medicines or blood draws, the port will need to be flushed: ? After medicines have been given. ? After blood has been drawn. ? As part of routine maintenance.  If a constant infusion is running, the port may not need to be flushed.  How long will my port stay implanted? The port can stay in for as long as your health care provider thinks it is needed. When it is time for the port to come out, surgery will be   done to remove it. The procedure is similar to the one performed when the port was put in. When should I seek immediate medical care? When you have an implanted port, you should seek immediate medical care if:  You notice a bad smell coming from the incision site.  You have swelling, redness, or drainage at the incision site.  You have more swelling or pain at the port site or the surrounding area.  You have a fever that is not controlled with medicine.  This information is not intended to replace advice given to you by your health care provider. Make sure you discuss any questions you have with your health care provider. Document  Released: 11/25/2005 Document Revised: 05/02/2016 Document Reviewed: 08/02/2013 Elsevier Interactive Patient Education  2017 Elsevier Inc.  

## 2017-09-18 LAB — HEMOGLOBIN A1C
ESTIMATED AVERAGE GLUCOSE: 123 mg/dL
HEMOGLOBIN A1C: 5.9 % — AB (ref 4.8–5.6)

## 2017-09-18 NOTE — Progress Notes (Signed)
Symptoms Management Clinic Progress Note   Maria Washington [id number] November 15, 1929 83 y.o.  Maria Washington is managed by Dr. Eilleen Kempf  Actively treated with chemotherapy: no  Last Treated: 06/18/2017  Assessment: Plan:    Hyperglycemia - Plan: Hemoglobin A1c  Stage IV squamous cell carcinoma of right lung ([place]) - Plan: oxyCODONE-acetaminophen (PERCOCET/ROXICET) 5-325 MG tablet, sodium chloride flush (NS) 0.9 % injection 10 mL, heparin lock flush 100 unit/mL, CT Abdomen Pelvis W Contrast, CT Chest W Contrast  Anorexia - Plan: CT Abdomen Pelvis W Contrast, CT Chest W Contrast  Weight loss, non-intentional - Plan: CT Abdomen Pelvis W Contrast, CT Chest W Contrast  Shortness of breath - Plan: CT Abdomen Pelvis W Contrast, CT Chest W Contrast   Stage IV squamous cell carcinoma of the right lung, anorexia, weight loss, and shortness of breath: The patient will be referred for restaging CT scans now. She will follow-up with Dr. Julien Nordmann following completion of these studies.  Please see After Visit Summary for patient specific instructions.  Future Appointments Date Time Provider [place]  09/29/2017 11:00 AM Melvenia Beam, MD [facility] None  10/09/2017 10:30 AM Bruning, Ashlyn, PA-C [facility] None  10/29/2017 10:00 AM [facility] LAB 2 [facility] None  10/29/2017 11:00 AM [facility] 2 [facility]   11/03/2017 9:15 AM Curt Bears, MD [facility] None  11/04/2017 11:00 AM Melvenia Beam, MD [facility] None    Orders Placed This Encounter  Procedures  . CT Abdomen Pelvis W Contrast  . CT Chest W Contrast  . Hemoglobin A1c       Subjective:   Patient ID:  Maria Washington is a 83 y.o. (DOB [birth year]) female.  Chief Complaint:  Chief Complaint  Patient presents with  . Shortness of Breath    HPI Maria Washington  is an 83 year old female with a history of a stage IV (T2b, N3, M1 A) non-small cell lung cancer, squamous cell carcinoma who is followed by  Dr. Julien Nordmann. She was originally diagnosed in March 2018 and was found to have a right upper lobe mass and additional bilateral mediastinal lymphadenopathy as well as a left upper lobe nodule. She was last seen by Dr. Julien Nordmann on 07/29/2017 and has been previously treated with carboplatin and Taxol from 03/24/2017 to 06/18/2017. Chemotherapy was discontinued due to progressive neuropathy. Restaging CT scans on 07/25/2017 showed a mixed response. She had regression in a left upper lobe lesion and and mediastinal adenopathy. The right upper lobe lesion grew from 3.4 x 2.1 cm to 4.43.7 cm.  She was treated with 10 fractions of radiation therapy which she completed on 09/04/2017. She presents to the office today with increasing shortness of breath, fatigue, an increased cough for the last 2-3 weeks with clear sputum. Additionally she has anorexia and has lost 13 pounds since 08/06/2017 as indicated by her weight at home reading today of 142 pounds.  Medications: I have reviewed the patient's current medications.  Allergies:  Allergies  Allergen Reactions  . Clindamycin/Lincomycin Rash  . Penicillins Rash and Other (See Comments)    Has patient had a PCN reaction causing immediate rash, facial/tongue/throat swelling, SOB or lightheadedness with hypotension: No Has patient had a PCN reaction causing severe rash involving mucus membranes or skin necrosis: No Has patient had a PCN reaction that required hospitalization: No Has patient had a PCN reaction occurring within the last 10 years: No If all of the above answers are "NO", then may proceed with Cephalosporin use.  Past Medical History:  Diagnosis Date  . Aortic atherosclerosis ([place]) 02/10/2017  . Arthritis   . CAD (coronary artery disease), native coronary artery    3 vessel calcification noted on CT scan   . Cardiac pacemaker in situ 12/05/2011  . Chronic diastolic congestive heart failure ([place])   . COPD (chronic obstructive pulmonary disease)  (HCC)    spot on lung  . Dehydration 06/04/2017  . Encounter for antineoplastic chemotherapy 03/04/2017  . Goals of care, counseling/discussion 03/17/2017  . History of breast cancer    Treated with lumpectomy, radiation, and chemo sees Dr. Ralene Ok   . Hypertensive heart disease without CHF   . Hypokalemia 04/18/2017  . Hypothyroid   . Mitral regurgitation   . Paroxysmal atrial fibrillation (HCC)    CHA2DS2VASC score 5  . Second degree heart block   . Stage IV squamous cell carcinoma of right lung ([place]) 03/04/2017    Past Surgical History:  Procedure Laterality Date  . ABDOMINAL HYSTERECTOMY    . BLADDER SUSPENSION    . BREAST LUMPECTOMY     lumpectomy  . CARPAL TUNNEL RELEASE     left wrist  . CHOLECYSTECTOMY    . FINGER SURGERY    . IR FLUORO GUIDE PORT INSERTION RIGHT  03/12/2017  . IR US GUIDE VASC ACCESS RIGHT  03/12/2017  . PERMANENT PACEMAKER INSERTION N/A 12/04/2011   Procedure: PERMANENT PACEMAKER INSERTION;  Surgeon: Evans Lance, MD;  Location[facility] CATH LAB;  Service: Cardiovascular;  Laterality: N/A;  . SHOULDER SURGERY    . TONSILLECTOMY AND ADENOIDECTOMY      Family History  Problem Relation Age of Onset  . Heart attack Mother   . Cancer Brother        kidney  . Cancer Daughter        lung    Social History   Social History  . Marital status: Widowed    Spouse name: N/A  . Number of children: 6  . Years of education: 63   Occupational History  . Not on file.   Social History Main Topics  . Smoking status: Former Smoker    Packs/day: 1.00    Years: 60.00    Types: Cigarettes    Quit date: 06/07/2013  . Smokeless tobacco: Never Used  . Alcohol use No  . Drug use: No  . Sexual activity: Not Currently   Other Topics Concern  . Not on file   Social History Narrative   Widow.  Has 22 dogs.   Right handed    Caffeine use: Coffee- 2 cups per day or less   Decaf Mtn Dew   Lives with daughter       Past Medical History, Surgical history, Social  history, and Family history were reviewed and updated as appropriate.   Please see review of systems for further details on the patient's review from today.   Review of Systems:  Review of Systems  Constitutional: Positive for appetite change, fatigue and unexpected weight change. Negative for chills, diaphoresis and fever.  Respiratory: Positive for cough, choking and shortness of breath.   Musculoskeletal: Positive for gait problem (ambulates with a walker).    Objective:   Physical Exam:  BP 126/72 (BP Location: Right Arm, Patient Position: Sitting)   Pulse 99   Temp 99.1 F (37.3 C) (Oral)   Resp 18   Ht 5' (1.524 m)   SpO2 99%  ECOG: 2  Physical Exam  Constitutional: No distress.  HENT:  Head: Normocephalic and atraumatic.  Mouth/Throat: Oropharynx is clear and moist. No oropharyngeal exudate.  Eyes: Right eye exhibits no discharge. Left eye exhibits no discharge.  Cardiovascular: Normal rate, regular rhythm and normal heart sounds.  Exam reveals no gallop and no friction rub.   No murmur heard. Pulmonary/Chest: Effort normal and breath sounds normal. No respiratory distress. She has no wheezes. She has no rales.    Musculoskeletal: She exhibits no edema.  Lymphadenopathy:       Head (right side): No submandibular, no preauricular and no posterior auricular adenopathy present.       Head (left side): No submandibular, no preauricular and no posterior auricular adenopathy present.    She has cervical adenopathy.       Right cervical: Superficial cervical adenopathy present. No deep cervical and no posterior cervical adenopathy present.      Left cervical: No superficial cervical, no deep cervical and no posterior cervical adenopathy present.       Right: No supraclavicular adenopathy present.       Left: No supraclavicular adenopathy present.  Neurological: She is alert. Coordination (The patient is ambulating with the use of a wheelchair.) abnormal.  Skin: Skin is  warm and dry. She is not diaphoretic.    Lab Review:     Component Value Date/Time   NA 137 09/17/2017 1004   K 4.4 09/17/2017 1004   CL 108 07/12/2017 2250   CO2 23 09/17/2017 1004   GLUCOSE 166 (H) 09/17/2017 1004   BUN 20.3 09/17/2017 1004   CREATININE 1.0 09/17/2017 1004   CALCIUM 10.2 09/17/2017 1004   PROT 7.8 09/17/2017 1004   ALBUMIN 3.5 09/17/2017 1004   AST 20 09/17/2017 1004   ALT 15 09/17/2017 1004   ALKPHOS 84 09/17/2017 1004   BILITOT 0.82 09/17/2017 1004   GFRNONAA >60 07/12/2017 2250   GFRAA >60 07/12/2017 2250       Component Value Date/Time   WBC 4.7 09/17/2017 1004   WBC 4.3 07/12/2017 2250   RBC 3.77 09/17/2017 1004   RBC 2.59 (L) 07/12/2017 2250   HGB 12.1 09/17/2017 1004   HCT 36.6 09/17/2017 1004   PLT 152 09/17/2017 1004   MCV 97.1 09/17/2017 1004   MCH 32.0 09/17/2017 1004   MCH 35.1 (H) 07/12/2017 2250   MCHC 33.0 09/17/2017 1004   MCHC 33.6 07/12/2017 2250   RDW 16.2 (H) 09/17/2017 1004   LYMPHSABS 0.4 (L) 09/17/2017 1004   MONOABS 0.6 09/17/2017 1004   EOSABS 0.1 09/17/2017 1004   BASOSABS 0.0 09/17/2017 1004   -------------------------------  Imaging from last 24 hours (if applicable):  Radiology interpretation: Dg Hand Complete Left  Result Date: 08/19/2017 CLINICAL DATA:  Pt c/o left hand pain from fall yesterday. Pt complains of 1st MCP joint pain AND visible posterior hand swelling. No hx of previous injury to left hand. EXAM: LEFT HAND - COMPLETE 3+ VIEW COMPARISON:  None. FINDINGS: There is no evidence of fracture or dislocation. There is no evidence of arthropathy or other focal bone abnormality. Soft tissues are unremarkable. IMPRESSION: Negative. Electronically Signed   By: Nolon Nations M.D.   On: 08/19/2017 17:07        This case was discussed with Dr. Julien Nordmann. He expressed agreement with my management of this patient.

## 2017-09-22 IMAGING — CT CT CHEST W/ CM
3 of 5 series · 14 of 36 positions shown, 17 images · IV contrast (ISOVUE 300)
Comparison: Multiple exams, including 05/26/2017

CLINICAL DATA: Stage IV squamous cell carcinoma of the right lung.
COPD. History of breast cancer. Neuropathy. Shortness of breath.

EXAM:
CT CHEST, ABDOMEN, AND PELVIS WITH CONTRAST
TECHNIQUE: Multidetector CT imaging of the chest, abdomen and pelvis was
performed following the standard protocol during bolus
administration of intravenous contrast.
CONTRAST:  100 mL Isovue 300

[Series 2: cap with · axial · 0.83mm/px · z∈[+1166,+1671]mm · 9 of 127 slices shown, 12 images]
[im 13/127  mediastinal]
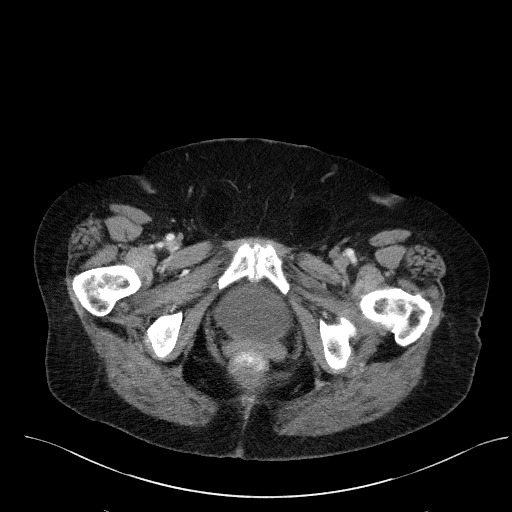
[im 13/127  lung]
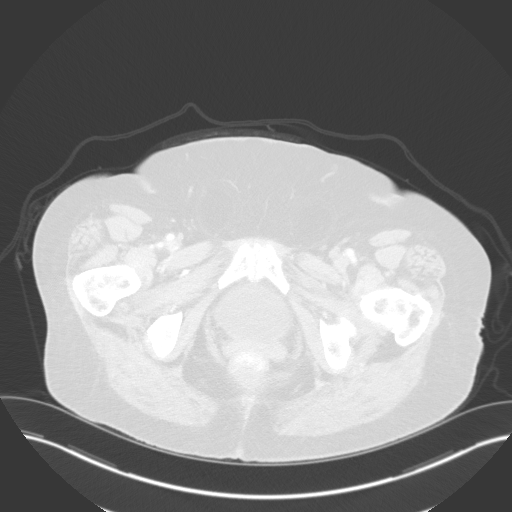
[im 26/127  lung]
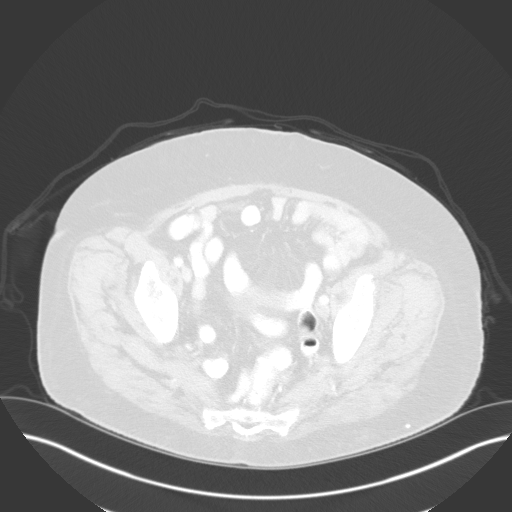
[im 38/127  lung]
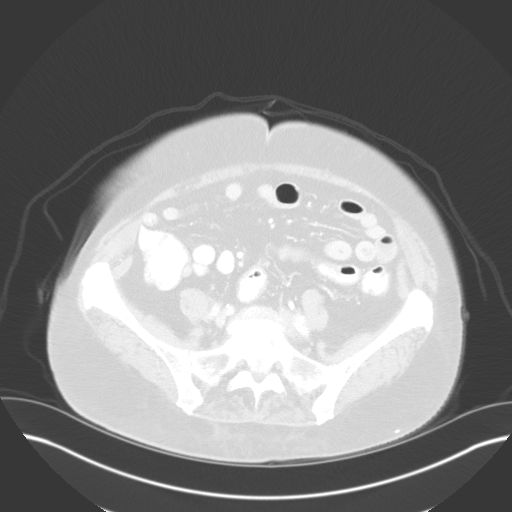
[im 51/127  lung]
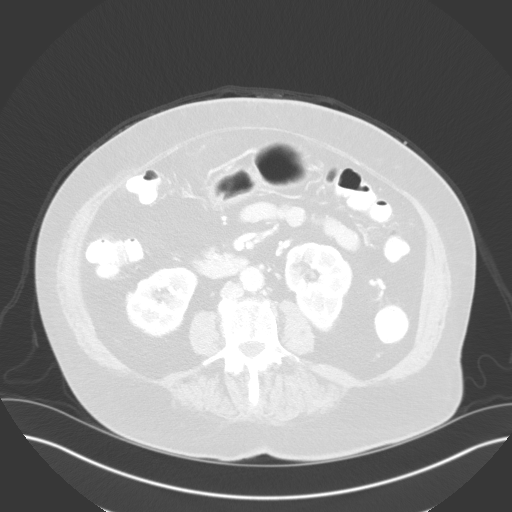
[im 64/127  mediastinal]
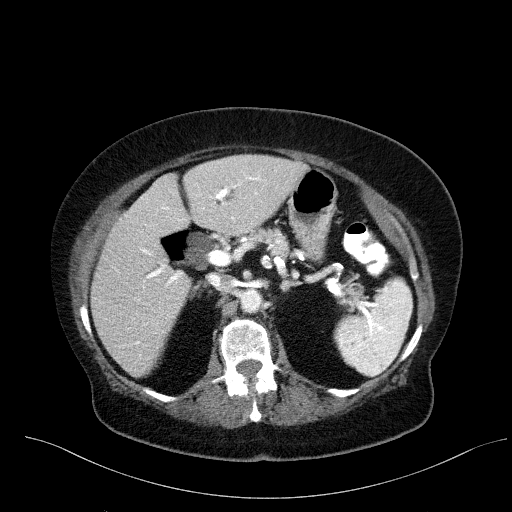
[im 64/127  lung]
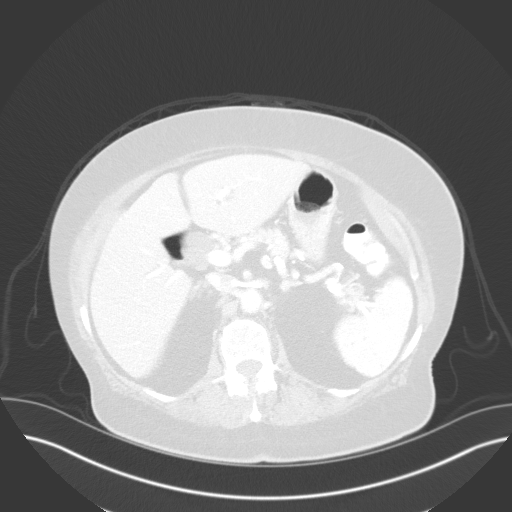
[im 76/127  lung]
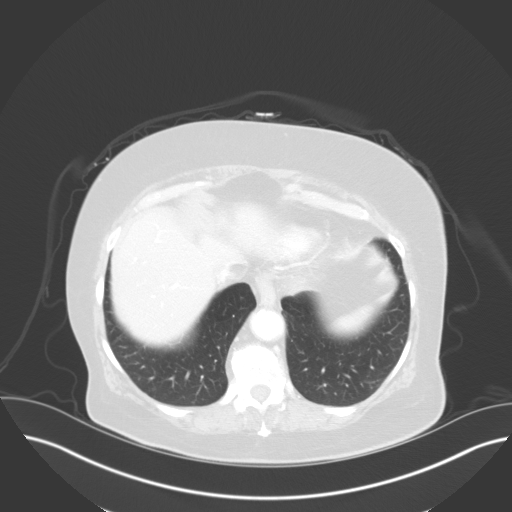
[im 89/127  lung]
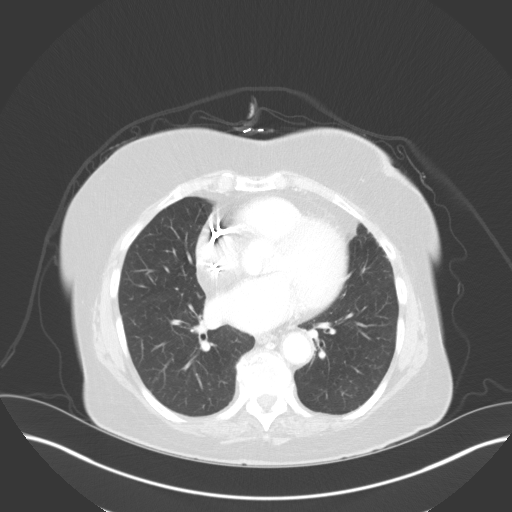
[im 101/127  lung]
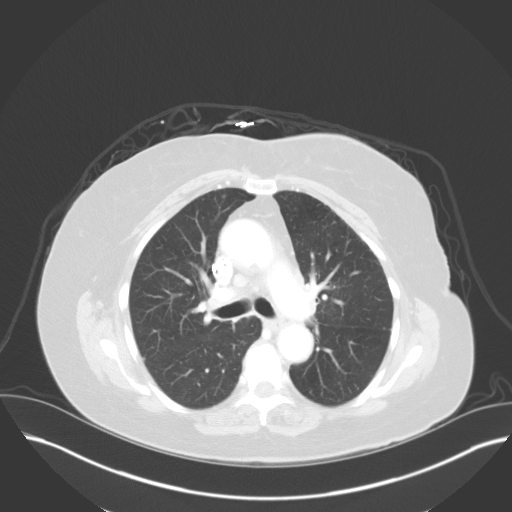
[im 114/127  mediastinal]
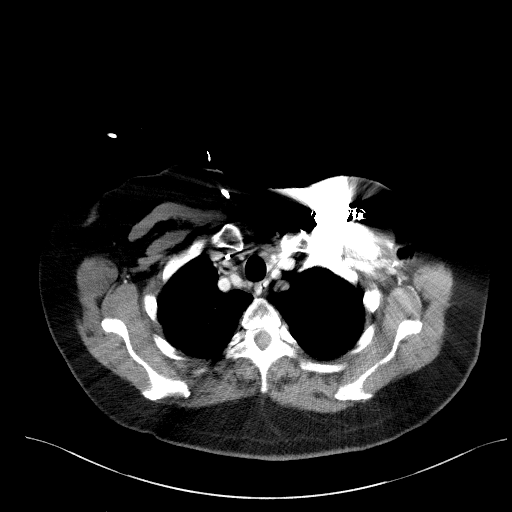
[im 114/127  lung]
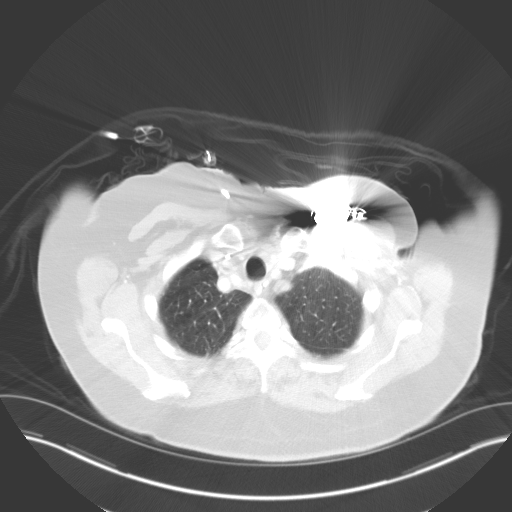

[Series 4: coronals · coronal · 0.97mm/px · 3 of 152 slices shown]
[im 31/152  lung]
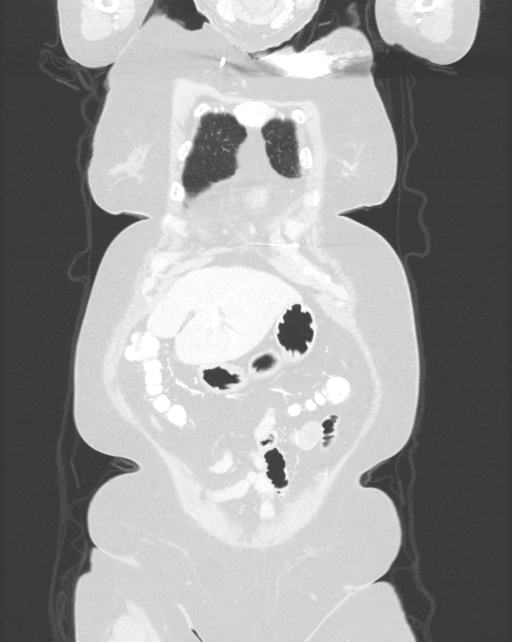
[im 61/152  lung]
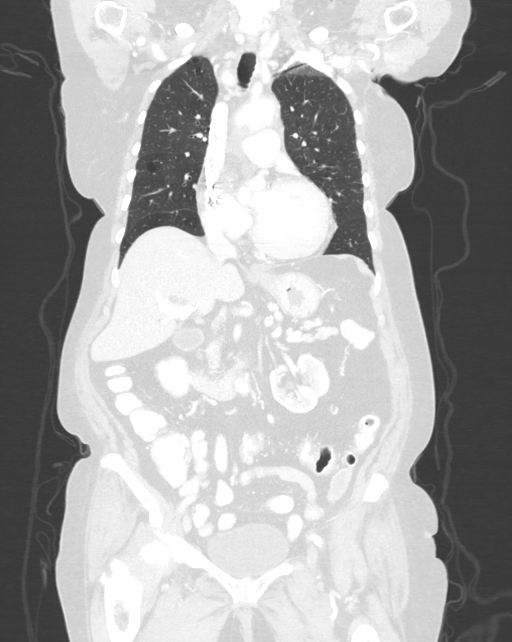
[im 91/152  lung]
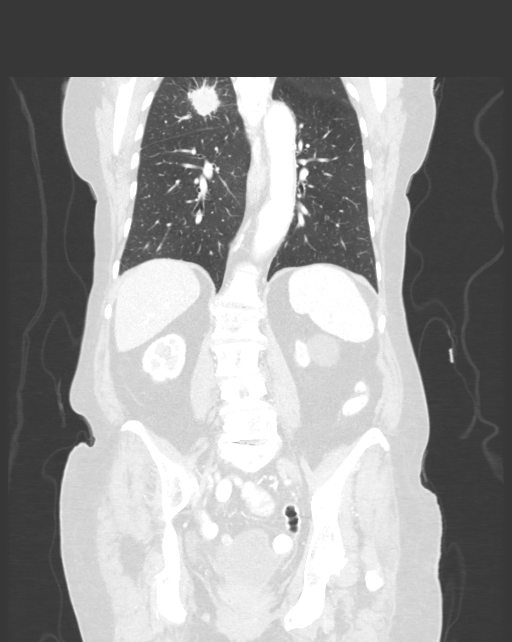

[Series 6: lung · axial · 0.83mm/px · z∈[+1446,+1490]mm · 2 of 157 slices shown]
[im 12/157  lung]
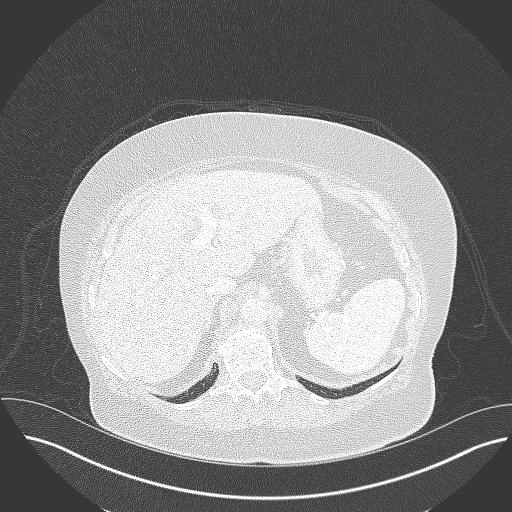
[im 34/157  lung]
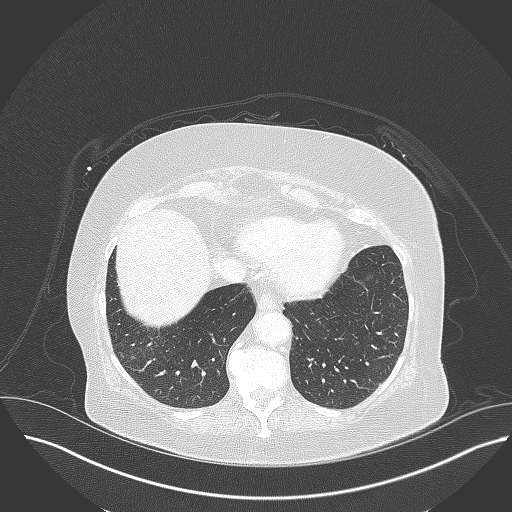

[14 of 36 positions shown; findings below may reference images not displayed]

FINDINGS: CT CHEST FINDINGS

Cardiovascular: Coronary, aortic arch, and branch vessel
atherosclerotic vascular disease. Right Port-A-Cath tip: SVC. Dual
lead pacer noted. Mild cardiomegaly.

Mediastinum/Nodes: Prevascular node 1.1 cm in short axis on image
[DATE], formerly 1.2 cm. AP window lymph node 1.1 cm in short axis on
image [DATE], formerly 1.6 cm.

Left axillary clips noted.

Lungs/Pleura: The right upper lobe mass 4.4 by 3.7 cm, formerly
by 2.1 cm.

Left upper lobe nodule along or within a left upper lobe airway
measures 0.6 by 0.4 cm, formerly 0.8 by 0.5 cm.

Centrilobular emphysema.

Musculoskeletal: Thoracic spondylosis.

CT ABDOMEN PELVIS FINDINGS

Hepatobiliary: Intrahepatic and extrahepatic biliary dilatation
similar to prior, some of this may represent a physiologic response
to cholecystectomy. An obvious cause for distal biliary obstruction
is not seen. No focal liver lesion identified.

Pancreas: Unremarkable

Spleen: Speckled hypodensity in the spleen is probably partially due
to early contrast phase are technically nonspecific, this is less
striking in the visualized part of the spleen on the delayed images.

Adrenals/Urinary Tract: Left kidney upper pole simple appearing
cysts 4.9 cm in diameter. An adjacent 1.3 cm cyst in the left mid
kidney appears to have a posterior calcification. Adrenal glands
normal. Right posterior bladder diverticulum.

Stomach/Bowel: Unremarkable

Vascular/Lymphatic: Aortoiliac atherosclerotic vascular disease.

Reproductive: Uterus absent.  Adnexa unremarkable.

Other: No supplemental non-categorized findings.

Musculoskeletal: Degenerative arthropathy of both hips. Mild
dextroconvex lumbar scoliosis with rotary component. Lumbar
spondylosis and degenerative disc disease with impingement on the
right at L5-S1 and L4-5. Grade 1 degenerative anterolisthesis at
L4-5.
IMPRESSION: 1. Mixed appearance with enlargement of the right upper lobe mass
but mildly reduced size of the mediastinal adenopathy. Slightly
reduced size of the bronchial nodule in the left upper lobe.
2. No findings of metastatic disease to the abdomen or pelvis.
3. Aortic Atherosclerosis (P7ZW6-Y09.9) and Emphysema (P7ZW6-FZR.A).
Coronary atherosclerosis.
4. Stable chronic biliary dilatation. Some of this may be a
physiologic response to cholecystectomy.
5. Lumbar scoliosis, spondylosis, and degenerative disc disease,
with right-sided impingement at L4-5 and L5-S 1.
6. Left renal cysts have benign imaging characteristics.
7. Faint speckled hypodensity in the spleen, likely
benign/incidental.

## 2017-09-24 ENCOUNTER — Other Ambulatory Visit: Payer: Self-pay | Admitting: Dermatology

## 2017-09-25 ENCOUNTER — Ambulatory Visit (HOSPITAL_COMMUNITY)
Admission: RE | Admit: 2017-09-25 | Discharge: 2017-09-25 | Disposition: A | Discharge status: Home / Self Care | Payer: Medicare Other | Source: Ambulatory Visit | Attending: Medical | Admitting: Medical

## 2017-09-25 ENCOUNTER — Other Ambulatory Visit: Payer: Self-pay | Admitting: Geriatric Medicine

## 2017-09-25 DIAGNOSIS — R0602 Shortness of breath: Secondary | ICD-10-CM

## 2017-09-25 DIAGNOSIS — R630 Anorexia: Secondary | ICD-10-CM | POA: Diagnosis present

## 2017-09-25 DIAGNOSIS — C3491 Malignant neoplasm of unspecified part of right bronchus or lung: Secondary | ICD-10-CM

## 2017-09-25 DIAGNOSIS — R590 Localized enlarged lymph nodes: Secondary | ICD-10-CM | POA: Insufficient documentation

## 2017-09-25 DIAGNOSIS — Z9889 Other specified postprocedural states: Secondary | ICD-10-CM | POA: Insufficient documentation

## 2017-09-25 DIAGNOSIS — Z1231 Encounter for screening mammogram for malignant neoplasm of breast: Secondary | ICD-10-CM

## 2017-09-25 DIAGNOSIS — R634 Abnormal weight loss: Secondary | ICD-10-CM | POA: Diagnosis present

## 2017-09-25 DIAGNOSIS — R918 Other nonspecific abnormal finding of lung field: Secondary | ICD-10-CM | POA: Diagnosis not present

## 2017-09-25 MED ORDER — HEPARIN SOD (PORK) LOCK FLUSH 100 UNIT/ML IV SOLN
INTRAVENOUS | Status: AC
  Filled 2017-09-25: qty 5

## 2017-09-25 MED ORDER — SODIUM CHLORIDE 0.9 % IV SOLN
Freq: Once | INTRAVENOUS | Status: DC
  Filled 2017-09-25: qty 0.5

## 2017-09-25 MED ORDER — HEPARIN SOD (PORK) LOCK FLUSH 100 UNIT/ML IV SOLN
500.0000 [IU] | Freq: Once | INTRAVENOUS | Status: AC
  Administered 2017-09-25: 500 [IU] via INTRAVENOUS

## 2017-09-25 MED ORDER — IOPAMIDOL (ISOVUE-300) INJECTION 61%
INTRAVENOUS | Status: AC
  Filled 2017-09-25: qty 100

## 2017-09-25 MED ORDER — HEPARIN SOD (PORK) LOCK FLUSH 100 UNIT/ML IV SOLN
INTRAVENOUS | Status: AC
  Administered 2017-09-25: 500 [IU] via INTRAVENOUS
  Filled 2017-09-25: qty 5

## 2017-09-25 MED ORDER — IOPAMIDOL (ISOVUE-300) INJECTION 61%
100.0000 mL | Freq: Once | INTRAVENOUS | Status: AC | PRN
  Administered 2017-09-25: 100 mL via INTRAVENOUS

## 2017-09-26 ENCOUNTER — Telehealth: Payer: Self-pay

## 2017-09-26 NOTE — Telephone Encounter (Signed)
Daughter came in with concerns about patient next office visit to discuss test result. Per 10/19 los

## 2017-09-29 ENCOUNTER — Encounter: Payer: Self-pay | Admitting: Nurse Practitioner

## 2017-09-29 ENCOUNTER — Ambulatory Visit (INDEPENDENT_AMBULATORY_CARE_PROVIDER_SITE_OTHER): Payer: Medicare Other | Admitting: Nurse Practitioner

## 2017-09-29 ENCOUNTER — Telehealth: Payer: Self-pay | Admitting: *Deleted

## 2017-09-29 ENCOUNTER — Telehealth: Payer: Self-pay | Admitting: Nurse Practitioner

## 2017-09-29 ENCOUNTER — Ambulatory Visit: Payer: Self-pay | Admitting: Neurology

## 2017-09-29 VITALS — BP 104/63 | HR 89 | Ht 60.0 in | Wt 142.0 lb

## 2017-09-29 DIAGNOSIS — G629 Polyneuropathy, unspecified: Secondary | ICD-10-CM | POA: Diagnosis not present

## 2017-09-29 MED ORDER — PREGABALIN 50 MG PO CAPS: 50.0000 mg | ORAL_CAPSULE | Freq: Two times a day (BID) | ORAL | 5 refills | Status: DC

## 2017-09-29 NOTE — Telephone Encounter (Signed)
Pt daughter has called to check on the status of the pregabalin (LYRICA) 50 MG capsule.  Informed daughter to allow a few more hours.  Pt daughter has not asked for a call back, only if there is a problem with the order being filled

## 2017-09-29 NOTE — Telephone Encounter (Signed)
Pt called requesting CT results, reviewed with MD notified pt per MD " everything looks good." Pt thanked me for call no further concerns.

## 2017-09-29 NOTE — Progress Notes (Signed)
[facility]  PATIENT: Maria Washington DOB: March 14, 1929   REASON FOR VISIT: follow-up for neuropathic pain following chemotherapy HISTORY FROM:patient and daughter    HISTORY OF PRESENT ILLNESS:UPDATE 10/22/2018CM Maria Washington, 83 year old female returns for follow-up with history of neuropathy secondary to chemotherapy.she received chemotherapy for stage IV non-small cell lung cancer.  When last seen by Dr. Jaynee Eagles she was placed on Neurontin however at 900 mg 3 times a day she did not get much benefit so she has tapered back and is only taking 300 mg daily at this point. She can't feel anything in her feet. She denies any burning shooting pains. Her legs hurt. She obtained  some physical therapy which she felt was beneficial. She is doing most of the HEP. She is ambulating with either a walker or cane at home.She has not had any falls.    08/04/17 AALouise [name] is a 83 y.o. female here as a referral from Dr. Felipa Eth for neuropathy. Past medical history of hypertension, heart disease, stage IV non-small cell lung cancer, squamous cell carcinoma presented with large right upper lobe lung mass in addition to bilateral mediastinal lymphadenopathy as well as left upper lobe lung nodule diagnosed in March 2018. Patient's prior therapy includes systemic chemotherapy with carboplatin and paclitaxel first dose was April 2018 status post 5 cycles. Chemotherapy was discontinued secondary to significant peripheral neuropathy. Foot pain started during chemotherapy in the fingers as well. She can't feel anything in the feet. Her bones ache but no burnig, tingling, shooting pains. Her legs also hurt, she has been taking sleeping pillas and pain pills. She has aching pains in her legs. Knee feels like it wants to buckle. Her balance is very much affected, she uses a walker. No improvement since July 11th. Hands are cold. No other focal neurologic deficits, associated symptoms, inciting events or  modifiable factors. She is here with a friend who provides much information.   REVIEW OF SYSTEMS: Full 14 system review of systems performed and notable only for those listed, all others are neg:  Constitutional: neg  Cardiovascular: neg Ear/Nose/Throat: hearing loss Skin: neg Eyes: neg Respiratory: shortness of breath Gastroitestinal: neg  Hematology/Lymphatic: neg  Endocrine: cold intolerance Musculoskeletal:neg Allergy/Immunology: neg Neurological: neg Psychiatric: neg Sleep : neg   ALLERGIES: Allergies  Allergen Reactions  . Clindamycin/Lincomycin Rash  . Penicillins Rash and Other (See Comments)    Has patient had a PCN reaction causing immediate rash, facial/tongue/throat swelling, SOB or lightheadedness with hypotension: No Has patient had a PCN reaction causing severe rash involving mucus membranes or skin necrosis: No Has patient had a PCN reaction that required hospitalization: No Has patient had a PCN reaction occurring within the last 10 years: No If all of the above answers are "NO", then may proceed with Cephalosporin use.    HOME MEDICATIONS: Outpatient Medications Prior to Visit  Medication Sig Dispense Refill  . alendronate (FOSAMAX) 70 MG tablet Take 70 mg by mouth every Monday.     Marland Kitchen apixaban (ELIQUIS) 5 MG TABS tablet Take 1 tablet (5 mg total) by mouth 2 (two) times daily. 60 tablet 3  . calcium-vitamin D (OSCAL WITH D) 500-200 MG-UNIT per tablet Take 1 tablet by mouth 2 (two) times daily.      . cholecalciferol (VITAMIN D) 1000 units tablet Take 1,000 Units by mouth daily.     Marland Kitchen diltiazem (CARDIZEM CD) 120 MG 24 hr capsule Take 120 mg by mouth daily.  3  . diphenoxylate-atropine (LOMOTIL)  2.5-0.025 MG tablet Take 1 tablet by mouth 4 (four) times daily as needed for diarrhea or loose stools. May take qid if imodium is not working. 30 tablet 0  . folic acid (FOLVITE) 1 MG tablet Take 1 mg by mouth daily.      . furosemide (LASIX) 40 MG tablet Take 40 mg  by mouth daily.     Marland Kitchen gabapentin (NEURONTIN) 300 MG capsule Take 2 capsules (600 mg total) by mouth 3 (three) times daily. 180 capsule 6  . levothyroxine (SYNTHROID, LEVOTHROID) 137 MCG tablet Take 137 mcg by mouth daily before breakfast.    . lidocaine-prilocaine (EMLA) cream Apply 1 application topically as needed. 30 g 0  . loperamide (IMODIUM) 2 MG capsule Take 2-4 mg by mouth 4 (four) times daily as needed for diarrhea or loose stools.     . Magnesium Oxide 400 (240 Mg) MG TABS TAKE 1 TABLET 3 TIMES A DAY 90 tablet 0  . Omega-3 Fatty Acids (FISH OIL) 1200 MG CAPS Take 1,200 mg by mouth 2 (two) times daily.     Marland Kitchen oxyCODONE-acetaminophen (PERCOCET/ROXICET) 5-325 MG tablet Take 1 tablet by mouth every 6 (six) hours as needed for severe pain. 30 tablet 0  . potassium chloride SA (K-DUR,KLOR-CON) 20 MEQ tablet Take 40 mEq by mouth 2 (two) times daily.     . prochlorperazine (COMPAZINE) 10 MG tablet Take 1 tablet (10 mg total) by mouth every 6 (six) hours as needed for nausea or vomiting. 30 tablet 0  . sucralfate (CARAFATE) 1 g tablet Take 1 tablet (1 g total) by mouth 4 (four) times daily -  with meals and at bedtime. 5 min before meals for radiation induced esophagitis 120 tablet 2  . vitamin B-12 (CYANOCOBALAMIN) 1000 MCG tablet Take 1,000 mcg by mouth daily.      . Wound Dressings (SONAFINE EX) Apply topically.     No facility-administered medications prior to visit.     PAST MEDICAL HISTORY: Past Medical History:  Diagnosis Date  . Aortic atherosclerosis ([place]) 02/10/2017  . Arthritis   . CAD (coronary artery disease), native coronary artery    3 vessel calcification noted on CT scan   . Cardiac pacemaker in situ 12/05/2011  . Chronic diastolic congestive heart failure ([place])   . COPD (chronic obstructive pulmonary disease) ([facility])    spot on lung  . Dehydration 06/04/2017  . Encounter for antineoplastic chemotherapy 03/04/2017  . Goals of care, counseling/discussion 03/17/2017  . History  of breast cancer    Treated with lumpectomy, radiation, and chemo sees Dr. Ralene Ok   . Hypertensive heart disease without CHF   . Hypokalemia 04/18/2017  . Hypothyroid   . Mitral regurgitation   . Paroxysmal atrial fibrillation ([facility])    CHA2DS2VASC score 5  . Second degree heart block   . Stage IV squamous cell carcinoma of right lung ([place]) 03/04/2017    PAST SURGICAL HISTORY: Past Surgical History:  Procedure Laterality Date  . ABDOMINAL HYSTERECTOMY    . BLADDER SUSPENSION    . BREAST LUMPECTOMY     lumpectomy  . CARPAL TUNNEL RELEASE     left wrist  . CHOLECYSTECTOMY    . FINGER SURGERY    . IR FLUORO GUIDE PORT INSERTION RIGHT  03/12/2017  . IR US GUIDE VASC ACCESS RIGHT  03/12/2017  . PERMANENT PACEMAKER INSERTION N/A 12/04/2011   Procedure: PERMANENT PACEMAKER INSERTION;  Surgeon: Evans Lance, MD;  [facility] C[facility] LAB;  Service: Cardiovascular;  Laterality:  N/A;  . SHOULDER SURGERY    . TONSILLECTOMY AND ADENOIDECTOMY      FAMILY HISTORY: Family History  Problem Relation Age of Onset  . Heart attack Mother   . Cancer Brother        kidney  . Cancer Daughter        lung    SOCIAL HISTORY: Social History   Social History  . Marital status: Widowed    Spouse name: N/A  . Number of children: 6  . Years of education: 83   Occupational History  . Not on file.   Social History Main Topics  . Smoking status: Former Smoker    Packs/day: 1.00    Years: 60.00    Types: Cigarettes    Quit date: 06/07/2013  . Smokeless tobacco: Never Used  . Alcohol use No  . Drug use: No  . Sexual activity: Not Currently   Other Topics Concern  . Not on file   Social History Narrative   Widow.  Has 22 dogs.   Right handed    Caffeine use: Coffee- 2 cups per day or less   Decaf Mtn Dew   Lives with daughter        PHYSICAL EXAM  Vitals:   09/29/17 1026  BP: 104/63  Pulse: 89  Weight: 142 lb (64.4 kg)  Height: 5' (1.524 m)   Body mass index is 27.73  kg/m.  Generalized: Well developed, in no acute distress  Head: normocephalic and atraumatic,. Oropharynx benign  Neck: Supple,  Musculoskeletal: No deformity  Skin peripheral edema noted both feet  Neurological examination   Mentation: Alert oriented to time, place, history taking. Attention span and concentration appropriate. Recent and remote memory intact.  Follows all commands speech and language fluent.   Cranial nerve II-XII: Pupils were equal round reactive to light extraocular movements were full, visual field were full on confrontational test. Facial sensation and strength were normal. hearing was intact to finger rubbing bilaterally. Uvula tongue midline. head turning and shoulder shrug were normal and symmetric.Tongue protrusion into cheek strength was normal. Motor: normal bulk and tone, full strength in the BUE, BLE,  Sensory: decreased pinprick to the knees and hands absent vibratory and proprioception  Coordination: finger-nose-finger, heel-to-shin bilaterally, no dysmetria Reflexes: absent in the lower extremities, plantar responses were flexor bilaterally. Gait and Station: not ambulated in wheelchair DIAGNOSTIC DATA (LABS, IMAGING, TESTING) - I reviewed patient records, labs, notes, testing and imaging myself where available.  Lab Results  Component Value Date   WBC 4.7 09/17/2017   HGB 12.1 09/17/2017   HCT 36.6 09/17/2017   MCV 97.1 09/17/2017   PLT 152 09/17/2017      Component Value Date/Time   NA 137 09/17/2017 1004   K 4.4 09/17/2017 1004   CL 108 07/12/2017 2250   CO2 23 09/17/2017 1004   GLUCOSE 166 (H) 09/17/2017 1004   BUN 20.3 09/17/2017 1004   CREATININE 1.0 09/17/2017 1004   CALCIUM 10.2 09/17/2017 1004   PROT 7.8 09/17/2017 1004   ALBUMIN 3.5 09/17/2017 1004   AST 20 09/17/2017 1004   ALT 15 09/17/2017 1004   ALKPHOS 84 09/17/2017 1004   BILITOT 0.82 09/17/2017 1004   GFRNONAA >60 07/12/2017 2250   GFRAA >60 07/12/2017 2250    Lab  Results  Component Value Date   HGBA1C 5.9 (H) 09/17/2017   No results found for: [id number] Lab Results  Component Value Date   TSH 0.418 09/25/2015  ASSESSMENT AND PLAN  83 y.o. female here as  for chemotherapy-induced neuropathy. Discussed the etiology of this neuropathy, last treatment was several months ago and she may or may not improve in the next year. At this point management of pain in his treatment and fall prevention. Discussed falls, due to proprioceptive deficits in the feet. Advised at night's to always keep light on so as not to fall. Patient received little benefit from Neurontin   PLAN: Continue HEP May continue  Neurontin 300mg  daily  Start Lyrica 50mg  twice daily Keep F/U with Dr. Jaynee Eagles next month Use walker or cane for safe ambulation  I spent 25 min  in total face to face time with the patient more than 50% of which was spent counseling and coordination of care, reviewing test results reviewing medications and discussing and reviewing the diagnosis of neuropathic pain and further treatment options. ,  Rayburn Ma, [facility], APRN  [facility] [address], [place], [place] [address] [phone]

## 2017-09-29 NOTE — Patient Instructions (Addendum)
Continue HEP May continue  Neurontin 300mg  daily  Start Lyrica 50mg  twice daily Keep F/U with Dr. Jaynee Eagles next month Use walker or cane for safe ambulation  Neuropathic Pain Neuropathic pain is pain caused by damage to the nerves that are responsible for certain sensations in your body (sensory nerves). The pain can be caused by damage to:  The sensory nerves that send signals to your spinal cord and brain (peripheral nervous system).  The sensory nerves in your brain or spinal cord (central nervous system).  Neuropathic pain can make you more sensitive to pain. What would be a minor sensation for most people may feel very painful if you have neuropathic pain. This is usually a long-term condition that can be difficult to treat. The type of pain can differ from person to person. It may start suddenly (acute), or it may develop slowly and last for a long time (chronic). Neuropathic pain may come and go as damaged nerves heal or may stay at the same level for years. It often causes emotional distress, loss of sleep, and a lower quality of life. What are the causes? The most common cause of damage to a sensory nerve is diabetes. Many other diseases and conditions can also cause neuropathic pain. Causes of neuropathic pain can be classified as:  Toxic. Many drugs and chemicals can cause toxic damage. The most common cause of toxic neuropathic pain is damage from drug treatment for cancer (chemotherapy).  Metabolic. This type of pain can happen when a disease causes imbalances that damage nerves. Diabetes is the most common of these diseases. Vitamin B deficiency caused by long-term alcohol abuse is another common cause.  Traumatic. Any injury that cuts, crushes, or stretches a nerve can cause damage and pain. A common example is feeling pain after losing an arm or leg (phantom limb pain).  Compression-related. If a sensory nerve gets trapped or compressed for a long period of time, the blood supply  to the nerve can be cut off.  Vascular. Many blood vessel diseases can cause neuropathic pain by decreasing blood supply and oxygen to nerves.  Autoimmune. This type of pain results from diseases in which the body's defense system mistakenly attacks sensory nerves. Examples of autoimmune diseases that can cause neuropathic pain include lupus and multiple sclerosis.  Infectious. Many types of viral infections can damage sensory nerves and cause pain. Shingles infection is a common cause of this type of pain.  Inherited. Neuropathic pain can be a symptom of many diseases that are passed down through families (genetic).  What are the signs or symptoms? The main symptom is pain. Neuropathic pain is often described as:  Burning.  Shock-like.  Stinging.  Hot or cold.  Itching.  How is this diagnosed? No single test can diagnose neuropathic pain. Your health care provider will do a physical exam and ask you about your pain. You may use a pain scale to describe how bad your pain is. You may also have tests to see if you have a high sensitivity to pain and to help find the cause and location of any sensory nerve damage. These tests may include:  Imaging studies, such as: ? X-rays. ? CT scan. ? MRI.  Nerve conduction studies to test how well nerve signals travel through your sensory nerves (electrodiagnostic testing).  Stimulating your sensory nerves through electrodes on your skin and measuring the response in your spinal cord and brain (somatosensory evoked potentials).  How is this treated? Treatment for neuropathic pain  may change over time. You may need to try different treatment options or a combination of treatments. Some options include:  Over-the-counter pain relievers.  Prescription medicines. Some medicines used to treat other conditions may also help neuropathic pain. These include medicines to: ? Control seizures (anticonvulsants). ? Relieve depression  (antidepressants).  Prescription-strength pain relievers (narcotics). These are usually used when other pain relievers do not help.  Transcutaneous nerve stimulation (TENS). This uses electrical currents to block painful nerve signals. The treatment is painless.  Topical and local anesthetics. These are medicines that numb the nerves. They can be injected as a nerve block or applied to the skin.  Alternative treatments, such as: ? Acupuncture. ? Meditation. ? Massage. ? Physical therapy. ? Pain management programs. ? Counseling.  Follow these instructions at home:  Learn as much as you can about your condition.  Take medicines only as directed by your health care provider.  Work closely with all your health care providers to find what works best for you.  Have a good support system at home.  Consider joining a chronic pain support group. Contact a health care provider if:  Your pain treatments are not helping.  You are having side effects from your medicines.  You are struggling with fatigue, mood changes, depression, or anxiety. This information is not intended to replace advice given to you by your health care provider. Make sure you discuss any questions you have with your health care provider. Document Released: 08/22/2004 Document Revised: 06/14/2016 Document Reviewed: 05/05/2014 Elsevier Interactive Patient Education  [employer].

## 2017-09-29 NOTE — Telephone Encounter (Signed)
Rx for Lyrica signed and faxed to patient's pharmacy.

## 2017-09-30 ENCOUNTER — Telehealth: Payer: Self-pay

## 2017-09-30 NOTE — Telephone Encounter (Signed)
Nutrition  Patient returned call this am.  Introduced myself and nutrition service.  Patient reports appetite is better and feels weight may have increased or at least has not lost anymore weight.  Patient denies needing nutrition service at this time.  Contact information provided and patient will reach out to nutrition if needed in the future.   Maria Washington, [place], [place] Registered Dietitian [phone] (pager)

## 2017-09-30 NOTE — Telephone Encounter (Signed)
Nutrition  Patient identified on Malnutrition Screening report for weight loss and poor appetite.  Called home phone number but was forwarded to mobile number and left message asking patient to return call.  Tiffiney Sparrow B. Zenia Resides, [place], [place] Registered Dietitian [phone] (pager)

## 2017-10-01 ENCOUNTER — Telehealth: Payer: Self-pay | Admitting: Nurse Practitioner

## 2017-10-01 DIAGNOSIS — G609 Hereditary and idiopathic neuropathy, unspecified: Secondary | ICD-10-CM

## 2017-10-01 MED ORDER — GABAPENTIN 300 MG PO CAPS: 300.0000 mg | ORAL_CAPSULE | Freq: Every day | ORAL | 1 refills | Status: DC

## 2017-10-01 NOTE — Telephone Encounter (Signed)
Patient daughter requesting refill for gabapentin (NEURONTIN) 300 MG capsule called to [facility] on [address].

## 2017-10-01 NOTE — Addendum Note (Signed)
Addended by: Brandon Melnick on: 10/01/2017 04:38 PM   Modules accepted: Orders

## 2017-10-08 NOTE — Progress Notes (Signed)
Personally  participated in, made any corrections needed, and agree with history, physical, neuro exam,assessment and plan as stated above.    Cynda Soule, MD [facility] 

## 2017-10-09 ENCOUNTER — Ambulatory Visit
Admission: RE | Admit: 2017-10-09 | Discharge: 2017-10-09 | Disposition: A | Discharge status: Home / Self Care | Payer: Medicare Other | Source: Ambulatory Visit | Attending: Urology | Admitting: Urology

## 2017-10-09 ENCOUNTER — Encounter: Payer: Self-pay | Admitting: Urology

## 2017-10-09 ENCOUNTER — Telehealth: Payer: Self-pay | Admitting: Medical Oncology

## 2017-10-09 VITALS — BP 115/68 | HR 95 | Temp 97.7°F | Resp 18 | Ht <= 58 in | Wt 149.6 lb

## 2017-10-09 DIAGNOSIS — Z510 Encounter for antineoplastic radiation therapy: Secondary | ICD-10-CM | POA: Diagnosis not present

## 2017-10-09 DIAGNOSIS — C3491 Malignant neoplasm of unspecified part of right bronchus or lung: Secondary | ICD-10-CM

## 2017-10-09 MED ORDER — LEVOFLOXACIN 500 MG PO TABS: 500.0000 mg | ORAL_TABLET | Freq: Every day | ORAL | 0 refills | Status: DC

## 2017-10-09 NOTE — Progress Notes (Signed)
Radiation Oncology         [phone] ________________________________  Name: Maria Washington MRN: [id number]  Date: 10/09/2017  DOB: 06-25-1929  Post Treatment Note  CC: Lajean Manes, MD  Curt Bears, MD  Diagnosis:   Stage IV, [phone], NSCLC, squamous cell carcinoma of theright upper lobe     Interval Since Last Radiation:  5 weeks  08/18/17 - 09/04/17:   Lung_Rt_UL/ 2.5Gy X 14 fractions  Narrative:  The patient returns today for routine follow-up.  In brief summary, she has a history of Stage IV, T2bN3M1a, NSCLC, squamous cell carcinoma of hte right upper lobe with metastatic disease to the left upper lobe, diagnosed in March 2018. She has completed systemic therapy with Carboplatin/Taxol every 3 weeks between 03/24/2017-06/18/2017, but this was discontinued due to progressive neuropathy, which she has been on neurontin for and recently added Lyrica. Repeat staging CTs on 07/25/2017 revealed a mixed response to treatment with regression in the LUL lesion and of the mediastinal adenopathy but the RUL lesion grew from 3.4 x 2.1 cm to 4.4 x 3.7 cm. She was referred at that time for consideration of palliative radiotherapy to the RUL lesion which she has since completed on 09/04/17.   She tolerated radiation treatment relatively well. By the end of treatment, she reported increased fatigue and intermittent lightheadedness when turning her head. She also endorsed dysphagia and a decreased appetite but denied any skin issues throughout.  Since completion of radiotherapy, she has had resolution of the dysphagia and improved appetite with weight gain recently.                        On review of systems, the patient states that she is doing well overall aside from persistent peripheral neuropathy secondary to her previous chemotherapy. She reports only occasional cough with clear-whitish sputum.  She did have a single episode of hemoptysis with bright red blood in the sputum on 10/01/17 and since  that time has only seen intermittent brown/rust colored sputum.  She denies chest pain, dyspnea, increased SOB, fever, chills or night sweats. She has chronic SOB with exertion but reports that this is unchanged recently.  Her only complaint today is of persistent peripheral neuropathy with numbness and tingling in her hands and feet.  She reports that the neuropathy is more pronounced in the left foot than the right and is preventing her from walking.  She presents in a wheelchair today and appears frustrated with being unable to ambulate.  She has also had some pain in the left hip recently that lasted 3 days and then spontaneously resolved.  The pain occurred with ambulation/transfers and improved with rest.  She denies hip pain at present.  She had recent follow up CT C/A/P on 09/25/17 for post-treatment assessment which revealed a good response to treatment with interval reduction in volume of the treated RIGHT upper lobe mass and stable appearance of the mediastinal lymphadenopathy.  There was no evidence of metastatic disease the abdomen pelvis and no evidence skeletal metastasis.  She is not currently on systemic therapy and has a scheduled follow up with Dr. Julien Nordmann on 11/03/17.  ALLERGIES:  is allergic to clindamycin/lincomycin and penicillins.  Meds: Current Outpatient Prescriptions  Medication Sig Dispense Refill  . alendronate (FOSAMAX) 70 MG tablet Take 70 mg by mouth every Monday.     Marland Kitchen apixaban (ELIQUIS) 5 MG TABS tablet Take 1 tablet (5 mg total) by mouth 2 (two) times daily. [place]  tablet 3  . calcium-vitamin D (OSCAL WITH D) 500-200 MG-UNIT per tablet Take 1 tablet by mouth 2 (two) times daily.      . cholecalciferol (VITAMIN D) 1000 units tablet Take 1,000 Units by mouth daily.     Marland Kitchen diltiazem (CARDIZEM CD) 120 MG 24 hr capsule Take 120 mg by mouth daily.  3  . folic acid (FOLVITE) 1 MG tablet Take 1 mg by mouth daily.      . furosemide (LASIX) 40 MG tablet Take 40 mg by mouth daily.      Marland Kitchen gabapentin (NEURONTIN) 300 MG capsule Take 1 capsule (300 mg total) by mouth daily. 90 capsule 1  . levothyroxine (SYNTHROID, LEVOTHROID) 137 MCG tablet Take 137 mcg by mouth daily before breakfast.    . lidocaine-prilocaine (EMLA) cream Apply 1 application topically as needed. 30 g 0  . Omega-3 Fatty Acids (FISH OIL) 1200 MG CAPS Take 1,200 mg by mouth 2 (two) times daily.     Marland Kitchen oxyCODONE-acetaminophen (PERCOCET/ROXICET) 5-325 MG tablet Take 1 tablet by mouth every 6 (six) hours as needed for severe pain. 30 tablet 0  . potassium chloride SA (K-DUR,KLOR-CON) 20 MEQ tablet Take 40 mEq by mouth 2 (two) times daily.     . pregabalin (LYRICA) 50 MG capsule Take 1 capsule (50 mg total) by mouth 2 (two) times daily. 60 capsule 5  . vitamin B-12 (CYANOCOBALAMIN) 1000 MCG tablet Take 1,000 mcg by mouth daily.      . diphenoxylate-atropine (LOMOTIL) 2.5-0.025 MG tablet Take 1 tablet by mouth 4 (four) times daily as needed for diarrhea or loose stools. May take qid if imodium is not working. (Patient not taking: Reported on 10/09/2017) 30 tablet 0  . loperamide (IMODIUM) 2 MG capsule Take 2-4 mg by mouth 4 (four) times daily as needed for diarrhea or loose stools.     . prochlorperazine (COMPAZINE) 10 MG tablet Take 1 tablet (10 mg total) by mouth every 6 (six) hours as needed for nausea or vomiting. (Patient not taking: Reported on 10/09/2017) 30 tablet 0   No current facility-administered medications for this encounter.     Physical Findings:  height is 5" (0.127 m) (abnormal) and weight is 149 lb 9.6 oz (67.9 kg). Her oral temperature is 97.7 F (36.5 C). Her blood pressure is 115/68 and her pulse is 95. Her respiration is 18 and oxygen saturation is 97%.  Pain Assessment Pain Score: 0-No pain/10 In general this is a well appearing caucasian female in no acute distress. She's alert and oriented x4 and appropriate throughout the examination. Cardiopulmonary assessment is negative for acute distress  and she exhibits normal effort. There are no wheezes, rales or rhonchi heard on exam.  There is 1+ pitting edema in bilateral lower extremities- likely dependant edema.   Lab Findings: Lab Results  Component Value Date   WBC 4.7 09/17/2017   HGB 12.1 09/17/2017   HCT 36.6 09/17/2017   MCV 97.1 09/17/2017   PLT 152 09/17/2017     Radiographic Findings: Ct Chest W Contrast  Result Date: 09/26/2017 CLINICAL DATA:  LEFT breast cancer. Bilateral lung cancer. Stage IV lung carcinoma. EXAM: CT CHEST, ABDOMEN, AND PELVIS WITH CONTRAST TECHNIQUE: Multidetector CT imaging of the chest, abdomen and pelvis was performed following the standard protocol during bolus administration of intravenous contrast. CONTRAST:  156mL ISOVUE-300 IOPAMIDOL (ISOVUE-300) INJECTION 61% COMPARISON:  CT 07/25/2017 FINDINGS: CT CHEST FINDINGS Cardiovascular: Coronary artery calcification and aortic atherosclerotic calcification. Mediastinum/Nodes: Port in the RIGHT chest  wall. No axillary supraclavicular adenopathy. Rounded mediastinal lymph nodes are similar to comparison exam. Prevascular lymph node measures 7 mm (image 19, series 2) compared to 9 mm. RIGHT lower paratracheal node measures 10 mm compared with 10 mm. No central pulmonary embolism. Lungs/Pleura: RIGHT upper lobe mass measures 2.7 x 2.5 cm decreased from 3.4 x 3.0 cm remeasured. There is fine ground-glass opacity in superior segment of the RIGHT lower lobe inflammatory pattern. Within the superior segment of the LEFT lower lobe there are multiple small 1 mm nodules (image 38, series 4 for example and image 34, series 4). Lesions subtly present present on comparison exam difficult to localize due to small size. LEFT upper lobe nodule along the airway is not changed measuring 4 mm (image 37, series 4 Musculoskeletal: No aggressive osseous lesion. CT ABDOMEN AND PELVIS FINDINGS Hepatobiliary: Liver is normal. There is mild intrahepatic and extrahepatic duct dilatation.  Postcholecystectomy. Common bile duct measures 8 mm similar comparison exam. No pancreatic duct dilatation. Pancreas: Pancreas is normal. No ductal dilatation. No pancreatic inflammation. Spleen: Normal spleen Adrenals/urinary tract: Adrenal glands normal. Bilateral renal cortical thinning. Benign cysts of the LEFT kidney. Ureters and bladder normal. Stomach/Bowel: Stomach, small bowel, appendix, and cecum are normal. The colon and rectosigmoid colon are normal. Vascular/Lymphatic: Abdominal aorta is normal caliber with atherosclerotic calcification. There is no retroperitoneal or periportal lymphadenopathy. No pelvic lymphadenopathy. Reproductive: Post hysterectomy Other: No peritoneal metastasis Musculoskeletal: No aggressive osseous lesion. IMPRESSION: Chest Impression: 1. Interval reduction in volume of RIGHT upper lobe mass. 2. Stable mild mediastinal lymphadenopathy. 3. Scattered small 1-2 mm nodules in the superior segment LEFT lower lobe. Recommend attention on follow-up. Number 4. Patchy ground-glass opacities superior segment of the RIGHT lower lobe suggest radiation change. Abdomen / Pelvis Impression: 1. No evidence of metastatic disease the abdomen pelvis. 2. No evidence skeletal metastasis. Electronically Signed   By: Suzy Bouchard M.D.   On: 09/26/2017 08:29   Ct Abdomen Pelvis W Contrast  Result Date: 09/26/2017 CLINICAL DATA:  LEFT breast cancer. Bilateral lung cancer. Stage IV lung carcinoma. EXAM: CT CHEST, ABDOMEN, AND PELVIS WITH CONTRAST TECHNIQUE: Multidetector CT imaging of the chest, abdomen and pelvis was performed following the standard protocol during bolus administration of intravenous contrast. CONTRAST:  182mL ISOVUE-300 IOPAMIDOL (ISOVUE-300) INJECTION 61% COMPARISON:  CT 07/25/2017 FINDINGS: CT CHEST FINDINGS Cardiovascular: Coronary artery calcification and aortic atherosclerotic calcification. Mediastinum/Nodes: Port in the RIGHT chest wall. No axillary supraclavicular  adenopathy. Rounded mediastinal lymph nodes are similar to comparison exam. Prevascular lymph node measures 7 mm (image 19, series 2) compared to 9 mm. RIGHT lower paratracheal node measures 10 mm compared with 10 mm. No central pulmonary embolism. Lungs/Pleura: RIGHT upper lobe mass measures 2.7 x 2.5 cm decreased from 3.4 x 3.0 cm remeasured. There is fine ground-glass opacity in superior segment of the RIGHT lower lobe inflammatory pattern. Within the superior segment of the LEFT lower lobe there are multiple small 1 mm nodules (image 38, series 4 for example and image 34, series 4). Lesions subtly present present on comparison exam difficult to localize due to small size. LEFT upper lobe nodule along the airway is not changed measuring 4 mm (image 37, series 4 Musculoskeletal: No aggressive osseous lesion. CT ABDOMEN AND PELVIS FINDINGS Hepatobiliary: Liver is normal. There is mild intrahepatic and extrahepatic duct dilatation. Postcholecystectomy. Common bile duct measures 8 mm similar comparison exam. No pancreatic duct dilatation. Pancreas: Pancreas is normal. No ductal dilatation. No pancreatic inflammation. Spleen: Normal spleen Adrenals/urinary tract:  Adrenal glands normal. Bilateral renal cortical thinning. Benign cysts of the LEFT kidney. Ureters and bladder normal. Stomach/Bowel: Stomach, small bowel, appendix, and cecum are normal. The colon and rectosigmoid colon are normal. Vascular/Lymphatic: Abdominal aorta is normal caliber with atherosclerotic calcification. There is no retroperitoneal or periportal lymphadenopathy. No pelvic lymphadenopathy. Reproductive: Post hysterectomy Other: No peritoneal metastasis Musculoskeletal: No aggressive osseous lesion. IMPRESSION: Chest Impression: 1. Interval reduction in volume of RIGHT upper lobe mass. 2. Stable mild mediastinal lymphadenopathy. 3. Scattered small 1-2 mm nodules in the superior segment LEFT lower lobe. Recommend attention on follow-up. Number  4. Patchy ground-glass opacities superior segment of the RIGHT lower lobe suggest radiation change. Abdomen / Pelvis Impression: 1. No evidence of metastatic disease the abdomen pelvis. 2. No evidence skeletal metastasis. Electronically Signed   By: Suzy Bouchard M.D.   On: 09/26/2017 08:29    Impression/Plan: 1. Stage IV, T2bN3M1a, NSCLC, squamous cell carcinoma of theright upper lobe. She appears to be recovering well from the effects of radiotherapy. Recent post-treatment imaging shows a good response to therapy with interval reduction in volume of the treated RIGHT upper lobe mass and stable appearance of the mediastinal lymphadenopathy.  There was no evidence of metastatic disease the abdomen pelvis and no evidence skeletal metastasis.  She is not currently on systemic therapy and has a scheduled follow up with Dr. Julien Nordmann on 11/03/17. I advised that we are happy to continue to participate in her care if clinically indicated but will plan to see her back on an as needed basis at this point.  She will continue routine follow up for disease management under the care and direction of Dr. Julien Nordmann.  She knows to call with any questions or concerns related to her previous radiotherapy. 2. Hemoptysis.  Due to the fact that she has continued with intermittent rust colored sputum over the past week, I have advised that we treat for a possible early pneumonia.  I have sent a prescription for Levaquin 500mg  po BID x 10 days. She knows to call immediately for further hemoptysis or worsening SOB, chest pain, fever or chills. 3. Chemo-induced peripheral neuropathy.  Continue Neurontin and Lyrica as prescribed and follow up with neurology for continued management.    Nicholos Johns, PA-C

## 2017-10-09 NOTE — Telephone Encounter (Signed)
Pt saw Dr Tammi Klippel today and he prescribed Levaquin .

## 2017-10-29 ENCOUNTER — Other Ambulatory Visit: Payer: Medicare Other

## 2017-10-29 ENCOUNTER — Ambulatory Visit (HOSPITAL_COMMUNITY): Payer: Medicare Other

## 2017-11-03 ENCOUNTER — Ambulatory Visit (HOSPITAL_BASED_OUTPATIENT_CLINIC_OR_DEPARTMENT_OTHER): Payer: Medicare Other | Admitting: Internal Medicine

## 2017-11-03 ENCOUNTER — Encounter: Payer: Self-pay | Admitting: *Deleted

## 2017-11-03 ENCOUNTER — Telehealth: Payer: Self-pay | Admitting: Internal Medicine

## 2017-11-03 ENCOUNTER — Other Ambulatory Visit (HOSPITAL_BASED_OUTPATIENT_CLINIC_OR_DEPARTMENT_OTHER): Payer: Medicare Other

## 2017-11-03 ENCOUNTER — Encounter: Payer: Self-pay | Admitting: Internal Medicine

## 2017-11-03 DIAGNOSIS — J449 Chronic obstructive pulmonary disease, unspecified: Secondary | ICD-10-CM

## 2017-11-03 DIAGNOSIS — C3411 Malignant neoplasm of upper lobe, right bronchus or lung: Secondary | ICD-10-CM

## 2017-11-03 DIAGNOSIS — C3490 Malignant neoplasm of unspecified part of unspecified bronchus or lung: Secondary | ICD-10-CM

## 2017-11-03 DIAGNOSIS — Z5111 Encounter for antineoplastic chemotherapy: Secondary | ICD-10-CM

## 2017-11-03 DIAGNOSIS — C3491 Malignant neoplasm of unspecified part of right bronchus or lung: Secondary | ICD-10-CM

## 2017-11-03 DIAGNOSIS — Z9221 Personal history of antineoplastic chemotherapy: Secondary | ICD-10-CM

## 2017-11-03 DIAGNOSIS — Z85118 Personal history of other malignant neoplasm of bronchus and lung: Secondary | ICD-10-CM

## 2017-11-03 LAB — CBC WITH DIFFERENTIAL/PLATELET
BASO%: 0.4 % (ref 0.0–2.0)
BASOS ABS: 0 10*3/uL (ref 0.0–0.1)
EOS ABS: 0.1 10*3/uL (ref 0.0–0.5)
EOS%: 2.2 % (ref 0.0–7.0)
HCT: 31.6 % — ABNORMAL LOW (ref 34.8–46.6)
HGB: 10 g/dL — ABNORMAL LOW (ref 11.6–15.9)
LYMPH%: 17.1 % (ref 14.0–49.7)
MCH: 29.6 pg (ref 25.1–34.0)
MCHC: 31.6 g/dL (ref 31.5–36.0)
MCV: 93.5 fL (ref 79.5–101.0)
MONO#: 0.7 10*3/uL (ref 0.1–0.9)
MONO%: 12.5 % (ref 0.0–14.0)
NEUT#: 3.8 10*3/uL (ref 1.5–6.5)
NEUT%: 67.8 % (ref 38.4–76.8)
PLATELETS: 207 10*3/uL (ref 145–400)
RBC: 3.38 10*6/uL — AB (ref 3.70–5.45)
RDW: 16.5 % — ABNORMAL HIGH (ref 11.2–14.5)
WBC: 5.5 10*3/uL (ref 3.9–10.3)
lymph#: 1 10*3/uL (ref 0.9–3.3)

## 2017-11-03 LAB — COMPREHENSIVE METABOLIC PANEL
ALBUMIN: 2.7 g/dL — AB (ref 3.5–5.0)
ALK PHOS: 61 U/L (ref 40–150)
ALT: 11 U/L (ref 0–55)
ANION GAP: 10 meq/L (ref 3–11)
AST: 13 U/L (ref 5–34)
BILIRUBIN TOTAL: 0.31 mg/dL (ref 0.20–1.20)
BUN: 10.1 mg/dL (ref 7.0–26.0)
CO2: 23 mEq/L (ref 22–29)
Calcium: 9.6 mg/dL (ref 8.4–10.4)
Chloride: 108 mEq/L (ref 98–109)
Creatinine: 0.8 mg/dL (ref 0.6–1.1)
GLUCOSE: 110 mg/dL (ref 70–140)
POTASSIUM: 3.9 meq/L (ref 3.5–5.1)
SODIUM: 141 meq/L (ref 136–145)
TOTAL PROTEIN: 6.8 g/dL (ref 6.4–8.3)

## 2017-11-03 MED ORDER — OXYCODONE-ACETAMINOPHEN 5-325 MG PO TABS: 1.0000 | ORAL_TABLET | Freq: Four times a day (QID) | ORAL | 0 refills | Status: DC | PRN

## 2017-11-03 NOTE — Progress Notes (Signed)
[facility] [phone]   [phone]  OFFICE PROGRESS NOTE  Lajean Manes, MD [address] [place] [address]  DIAGNOSIS: Stage IV (T2b, N3, M1 a) non-small cell lung cancer, squamous cell carcinoma presented with large right upper lobe lung mass in addition to bilateral mediastinal lymphadenopathy as well as left upper lobe lung nodule diagnosed in March 2018. PDL 1 expression is 5%.   PRIOR THERAPY: Systemic chemotherapy with carboplatin for AUC of 5 and paclitaxel 175 MG/M2 every 3 weeks. First dose 03/24/2017. Status post 5 cycles. Last cycle was given 06/18/2017. Discontinued secondary to entrance and significant peripheral neuropathy.  CURRENT THERAPY: None.  INTERVAL HISTORY: Maria Washington 84 y.o. female returns to the clinic today for follow-up visit accompanied by her daughter.  The patient is feeling fine today with no specific complaints except for mild cough with occasional blood-tinged sputum.  She denied having any chest pain but has shortness of breath with exertion.  She has no significant weight loss or night sweats.  She has no nausea, vomiting, diarrhea or constipation.  She has been in observation since July 2018 and has been feeling well.  She had a repeat CT scan of the chest, abdomen and pelvis last month and the patient is here today for evaluation and the scan results and treatment options.  MEDICAL HISTORY: Past Medical History:  Diagnosis Date  . Aortic atherosclerosis ([facility]) 02/10/2017  . Arthritis   . CAD (coronary artery disease), native coronary artery    3 vessel calcification noted on CT scan   . Cardiac pacemaker in situ 12/05/2011  . Chronic diastolic congestive heart failure ([facility])   . COPD (chronic obstructive pulmonary disease) ([facility])    spot on lung  . Dehydration 06/04/2017  . Encounter for antineoplastic chemotherapy 03/04/2017  . Goals of care, counseling/discussion 03/17/2017  . History of  breast cancer    Treated with lumpectomy, radiation, and chemo sees Dr. Ralene Ok   . Hypertensive heart disease without CHF   . Hypokalemia 04/18/2017  . Hypothyroid   . Mitral regurgitation   . Paroxysmal atrial fibrillation ([facility])    CHA2DS2VASC score 5  . Second degree heart block   . Stage IV squamous cell carcinoma of right lung ([facility]) 03/04/2017    ALLERGIES:  is allergic to clindamycin/lincomycin and penicillins.  MEDICATIONS:  Current Outpatient Medications  Medication Sig Dispense Refill  . alendronate (FOSAMAX) 70 MG tablet Take 70 mg by mouth every Monday.     Marland Kitchen apixaban (ELIQUIS) 5 MG TABS tablet Take 1 tablet (5 mg total) by mouth 2 (two) times daily. 60 tablet 3  . calcium-vitamin D (OSCAL WITH D) 500-200 MG-UNIT per tablet Take 1 tablet by mouth 2 (two) times daily.      . cholecalciferol (VITAMIN D) 1000 units tablet Take 1,000 Units by mouth daily.     Marland Kitchen diltiazem (CARDIZEM CD) 120 MG 24 hr capsule Take 120 mg by mouth daily.  3  . diphenoxylate-atropine (LOMOTIL) 2.5-0.025 MG tablet Take 1 tablet by mouth 4 (four) times daily as needed for diarrhea or loose stools. May take qid if imodium is not working. (Patient not taking: Reported on 10/09/2017) 30 tablet 0  . folic acid (FOLVITE) 1 MG tablet Take 1 mg by mouth daily.      . furosemide (LASIX) 40 MG tablet Take 40 mg by mouth daily.     Marland Kitchen gabapentin (NEURONTIN) 300 MG capsule Take 1  capsule (300 mg total) by mouth daily. 90 capsule 1  . levofloxacin (LEVAQUIN) 500 MG tablet Take 1 tablet (500 mg total) by mouth daily. 10 tablet 0  . levothyroxine (SYNTHROID, LEVOTHROID) 137 MCG tablet Take 137 mcg by mouth daily before breakfast.    . lidocaine-prilocaine (EMLA) cream Apply 1 application topically as needed. 30 g 0  . loperamide (IMODIUM) 2 MG capsule Take 2-4 mg by mouth 4 (four) times daily as needed for diarrhea or loose stools.     . Omega-3 Fatty Acids (FISH OIL) 1200 MG CAPS Take 1,200 mg by mouth 2 (two) times  daily.     Marland Kitchen oxyCODONE-acetaminophen (PERCOCET/ROXICET) 5-325 MG tablet Take 1 tablet by mouth every 6 (six) hours as needed for severe pain. 30 tablet 0  . potassium chloride SA (K-DUR,KLOR-CON) 20 MEQ tablet Take 40 mEq by mouth 2 (two) times daily.     . pregabalin (LYRICA) 50 MG capsule Take 1 capsule (50 mg total) by mouth 2 (two) times daily. 60 capsule 5  . prochlorperazine (COMPAZINE) 10 MG tablet Take 1 tablet (10 mg total) by mouth every 6 (six) hours as needed for nausea or vomiting. (Patient not taking: Reported on 10/09/2017) 30 tablet 0  . vitamin B-12 (CYANOCOBALAMIN) 1000 MCG tablet Take 1,000 mcg by mouth daily.       No current facility-administered medications for this visit.     SURGICAL HISTORY:  Past Surgical History:  Procedure Laterality Date  . ABDOMINAL HYSTERECTOMY    . BLADDER SUSPENSION    . BREAST LUMPECTOMY     lumpectomy  . CARPAL TUNNEL RELEASE     left wrist  . CHOLECYSTECTOMY    . FINGER SURGERY    . IR FLUORO GUIDE PORT INSERTION RIGHT  03/12/2017  . IR US GUIDE VASC ACCESS RIGHT  03/12/2017  . PERMANENT PACEMAKER INSERTION N/A 12/04/2011   Procedure: PERMANENT PACEMAKER INSERTION;  Surgeon: Evans Lance, MD;  Location: [facility] CATH LAB;  Service: Cardiovascular;  Laterality: N/A;  . SHOULDER SURGERY    . TONSILLECTOMY AND ADENOIDECTOMY      REVIEW OF SYSTEMS:  A comprehensive review of systems was negative except for: Respiratory: positive for cough and dyspnea on exertion   PHYSICAL EXAMINATION: General appearance: alert, cooperative and no distress Head: Normocephalic, without obvious abnormality, atraumatic Neck: no adenopathy, no JVD, supple, symmetrical, trachea midline and thyroid not enlarged, symmetric, no tenderness/mass/nodules Lymph nodes: Cervical, supraclavicular, and axillary nodes normal. Resp: clear to auscultation bilaterally Back: symmetric, no curvature. ROM normal. No CVA tenderness. Cardio: regular rate and rhythm, S1, S2 normal,  no murmur, click, rub or gallop GI: soft, non-tender; bowel sounds normal; no masses,  no organomegaly Extremities: extremities normal, atraumatic, no cyanosis or edema  ECOG PERFORMANCE STATUS: 1 - Symptomatic but completely ambulatory  Blood pressure 122/80, pulse 84, temperature 97.9 F (36.6 C), temperature source Oral, resp. rate 18, height 5' (1.524 m), weight 146 lb (66.2 kg), SpO2 98 %.  LABORATORY DATA: Lab Results  Component Value Date   WBC 5.5 11/03/2017   HGB 10.0 (L) 11/03/2017   HCT 31.6 (L) 11/03/2017   MCV 93.5 11/03/2017   PLT 207 11/03/2017      Chemistry      Component Value Date/Time   NA 137 09/17/2017 1004   K 4.4 09/17/2017 1004   CL 108 07/12/2017 2250   CO2 23 09/17/2017 1004   BUN 20.3 09/17/2017 1004   CREATININE 1.0 09/17/2017 1004  Component Value Date/Time   CALCIUM 10.2 09/17/2017 1004   ALKPHOS 84 09/17/2017 1004   AST 20 09/17/2017 1004   ALT 15 09/17/2017 1004   BILITOT 0.82 09/17/2017 1004       RADIOGRAPHIC STUDIES: No results found.  ASSESSMENT AND PLAN:  This is a very pleasant 83 years old white female with a stage IV non-small cell lung cancer, adenocarcinoma with PDL 1 expression of 5%. The patient underwent systemic chemotherapy with carboplatin and paclitaxel, status post 5 cycles and has been tolerating her treatment well except for increasing fatigue and weakness as well as development of peripheral neuropathy. Last dose of chemotherapy was in July 2018.  The patient has been on observation since that time. Her recent CT scan of the chest, abdomen and pelvis in October 2018 showed no evidence for disease progression. I discussed the scan results with the patient and her daughter and recommended for her to continue on observation. I will see her back for follow-up visit in 3 months for evaluation with repeat CT scan of the chest, abdomen and pelvis for restaging of her disease. The patient will continue to have  Port-A-Cath flush every 6 weeks. For pain management, she was given refill of Percocet. She was advised to call immediately if she has any concerning symptoms in the interval. The patient voices understanding of current disease status and treatment options and is in agreement with the current care plan. All questions were answered. The patient knows to call the clinic with any problems, questions or concerns. We can certainly see the patient much sooner if necessary.  Disclaimer: This note was dictated with voice recognition software. Similar sounding words can inadvertently be transcribed and may not be corrected upon review.

## 2017-11-03 NOTE — Telephone Encounter (Signed)
Gave avs and calendar for December - June 2019 ° °

## 2017-11-04 ENCOUNTER — Telehealth: Payer: Self-pay | Admitting: *Deleted

## 2017-11-04 ENCOUNTER — Ambulatory Visit (INDEPENDENT_AMBULATORY_CARE_PROVIDER_SITE_OTHER): Payer: Medicare Other | Admitting: Neurology

## 2017-11-04 ENCOUNTER — Ambulatory Visit
Admission: RE | Admit: 2017-11-04 | Discharge: 2017-11-04 | Disposition: A | Discharge status: Home / Self Care | Payer: Medicare Other | Source: Ambulatory Visit | Attending: Geriatric Medicine | Admitting: Geriatric Medicine

## 2017-11-04 ENCOUNTER — Encounter: Payer: Self-pay | Admitting: Neurology

## 2017-11-04 VITALS — BP 114/67 | HR 99 | Ht 60.0 in | Wt 146.0 lb

## 2017-11-04 DIAGNOSIS — Z1231 Encounter for screening mammogram for malignant neoplasm of breast: Secondary | ICD-10-CM

## 2017-11-04 DIAGNOSIS — G620 Drug-induced polyneuropathy: Secondary | ICD-10-CM

## 2017-11-04 DIAGNOSIS — T451X5A Adverse effect of antineoplastic and immunosuppressive drugs, initial encounter: Principal | ICD-10-CM

## 2017-11-04 HISTORY — DX: Personal history of antineoplastic chemotherapy: Z92.21

## 2017-11-04 HISTORY — DX: Personal history of irradiation: Z92.3

## 2017-11-04 MED ORDER — PREGABALIN 100 MG PO CAPS: 100.0000 mg | ORAL_CAPSULE | Freq: Two times a day (BID) | ORAL | 4 refills | Status: DC

## 2017-11-04 NOTE — Progress Notes (Signed)
[facility]    Provider:  Dr Jaynee Eagles Referring Provider: Lajean Manes, MD Primary Care Physician:  Lajean Manes, MD  CC:  Peripheral neuropathy chemotherapy-induced  Interval history 11/04/2017: Here for follow up of neuropathy. Increased neurontin but did not tolerate/help and she is back on 300mg  tid. She had physical therapy. Tried Lyrica.  She still feels her balance is wobbly, Improved. Discussed do not take   HPI:  Maria Washington is a 83 y.o. female here as a referral from Dr. Felipa Eth for neuropathy. Past medical history of hypertension, heart disease, stage IV non-small cell lung cancer, squamous cell carcinoma presented with large right upper lobe lung mass in addition to bilateral mediastinal lymphadenopathy as well as left upper lobe lung nodule diagnosed in March 2018. Patient's prior therapy includes systemic chemotherapy with carboplatin and paclitaxel first dose was April 2018 status post 5 cycles. Chemotherapy was discontinued secondary to significant peripheral neuropathy. Foot pain started during chemotherapy in the fingers as well. She can't feel anything in the feet. Her bones ache but no burnig, tingling, shooting pains. Her legs also hurt, she has been taking sleeping pillas and pain pills. She has aching pains in her legs. Knee feels like it wants to buckle. Her balance is very much affected, she uses a walker. No improvement since July 11th. Hands are cold. No other focal neurologic deficits, associated symptoms, inciting events or modifiable factors. She is here with a friend who provides much information.  Reviewed notes, labs and imaging from outside physicians, which showed:    Past medical history of stage IV non-small cell lung cancer, squamous cell carcinoma presented with large right upper lobe lung mass in addition to bilateral mediastinal lymphadenopathy as well as left upper lobe lung nodule diagnosed in March 2018. Patient's prior therapy  includes systemic chemotherapy with carboplatin and paclitaxel first dose was April 2018 status post 5 cycles. She complains of significant fatigue and weakness as well as peripheral neuropathy mainly in the lower legs. She also continues to have urinary incontinence. She is currently on Neurontin 200 mg by mouth 3 times a day.  Review of Systems: Patient complains of symptoms per HPI as well as the following symptoms: Weight loss, fatigue, easy bruising, shortness of breath, cough, swelling of legs, snoring, feeling cold, skin sensitivity, numbness, weakness, sleepiness, snoring, restless legs. Pertinent negatives and positives per HPI. All others negative.    Social History   Socioeconomic History  . Marital status: Widowed    Spouse name: Not on file  . Number of children: 6  . Years of education: 79  . Highest education level: Not on file  Social Needs  . Financial resource strain: Not on file  . Food insecurity - worry: Not on file  . Food insecurity - inability: Not on file  . Transportation needs - medical: Not on file  . Transportation needs - non-medical: Not on file  Occupational History  . Not on file  Tobacco Use  . Smoking status: Former Smoker    Packs/day: 1.00    Years: 60.00    Pack years: 60.00    Types: Cigarettes    Last attempt to quit: 06/07/2013    Years since quitting: 4.4  . Smokeless tobacco: Never Used  Substance and Sexual Activity  . Alcohol use: No  . Drug use: No  . Sexual activity: Not Currently  Other Topics Concern  . Not on file  Social History Narrative   Widow.  Has 22 dogs.  Right handed    Caffeine use: Coffee- 2 cups per day or less   Decaf Mtn Dew   Lives with daughter    Family History  Problem Relation Age of Onset  . Heart attack Mother   . Cancer Brother        kidney  . Cancer Daughter        lung  . Breast cancer Neg Hx     Past Medical History:  Diagnosis Date  . Aortic atherosclerosis ([place]) 02/10/2017  .  Arthritis   . CAD (coronary artery disease), native coronary artery    3 vessel calcification noted on CT scan   . Cardiac pacemaker in situ 12/05/2011  . Chronic diastolic congestive heart failure ([place])   . COPD (chronic obstructive pulmonary disease) ([facility])    spot on lung  . Dehydration 06/04/2017  . Encounter for antineoplastic chemotherapy 03/04/2017  . Goals of care, counseling/discussion 03/17/2017  . History of breast cancer    Treated with lumpectomy, radiation, and chemo sees Dr. Ralene Ok   . Hypertensive heart disease without CHF   . Hypokalemia 04/18/2017  . Hypothyroid   . Mitral regurgitation   . Paroxysmal atrial fibrillation ([facility])    CHA2DS2VASC score 5  . Personal history of chemotherapy   . Personal history of radiation therapy   . Second degree heart block   . Stage IV squamous cell carcinoma of right lung ([place]) 03/04/2017    Past Surgical History:  Procedure Laterality Date  . ABDOMINAL HYSTERECTOMY    . BLADDER SUSPENSION    . BREAST LUMPECTOMY Left    lumpectomy  . CARPAL TUNNEL RELEASE     left wrist  . CHOLECYSTECTOMY    . FINGER SURGERY    . IR FLUORO GUIDE PORT INSERTION RIGHT  03/12/2017  . IR US GUIDE VASC ACCESS RIGHT  03/12/2017  . PERMANENT PACEMAKER INSERTION N/A 12/04/2011   Procedure: PERMANENT PACEMAKER INSERTION;  Surgeon: Evans Lance, MD;  Location: [facility] CATH LAB;  Service: Cardiovascular;  Laterality: N/A;  . SHOULDER SURGERY    . TONSILLECTOMY AND ADENOIDECTOMY      Current Outpatient Medications  Medication Sig Dispense Refill  . alendronate (FOSAMAX) 70 MG tablet Take 70 mg by mouth every Monday.     Marland Kitchen apixaban (ELIQUIS) 5 MG TABS tablet Take 1 tablet (5 mg total) by mouth 2 (two) times daily. 60 tablet 3  . calcium-vitamin D (OSCAL WITH D) 500-200 MG-UNIT per tablet Take 1 tablet by mouth 2 (two) times daily.      . cholecalciferol (VITAMIN D) 1000 units tablet Take 1,000 Units by mouth daily.     Marland Kitchen diltiazem (CARDIZEM CD) 120 MG 24 hr  capsule Take 120 mg by mouth daily.  3  . diphenoxylate-atropine (LOMOTIL) 2.5-0.025 MG tablet Take 1 tablet by mouth 4 (four) times daily as needed for diarrhea or loose stools. May take qid if imodium is not working. 30 tablet 0  . folic acid (FOLVITE) 1 MG tablet Take 1 mg by mouth daily.      . furosemide (LASIX) 40 MG tablet Take 40 mg by mouth daily.     Marland Kitchen levothyroxine (SYNTHROID, LEVOTHROID) 137 MCG tablet Take 137 mcg by mouth daily before breakfast.    . lidocaine-prilocaine (EMLA) cream Apply 1 application topically as needed. 30 g 0  . loperamide (IMODIUM) 2 MG capsule Take 2-4 mg by mouth 4 (four) times daily as needed for diarrhea or loose stools.     Marland Kitchen  Omega-3 Fatty Acids (FISH OIL) 1200 MG CAPS Take 1,200 mg by mouth 2 (two) times daily.     Marland Kitchen oxyCODONE-acetaminophen (PERCOCET/ROXICET) 5-325 MG tablet Take 1 tablet by mouth every 6 (six) hours as needed for severe pain. 30 tablet 0  . potassium chloride SA (K-DUR,KLOR-CON) 20 MEQ tablet Take 40 mEq by mouth 2 (two) times daily.     . pregabalin (LYRICA) 100 MG capsule Take 1 capsule (100 mg total) by mouth 2 (two) times daily. 180 capsule 4  . prochlorperazine (COMPAZINE) 10 MG tablet Take 1 tablet (10 mg total) by mouth every 6 (six) hours as needed for nausea or vomiting. 30 tablet 0  . vitamin B-12 (CYANOCOBALAMIN) 1000 MCG tablet Take 1,000 mcg by mouth daily.       No current facility-administered medications for this visit.     Allergies as of 11/04/2017 - Review Complete 11/04/2017  Allergen Reaction Noted  . Clindamycin/lincomycin Rash 12/02/2011  . Penicillins Rash and Other (See Comments) 12/02/2011    Vitals: BP 114/67   Pulse 99   Ht 5' (1.524 m)   Wt 146 lb (66.2 kg)   BMI 28.51 kg/m  Last Weight:  Wt Readings from Last 1 Encounters:  11/04/17 146 lb (66.2 kg)   Last Height:   Ht Readings from Last 1 Encounters:  11/04/17 5' (1.524 m)       Exam: Gen: NAD CV: RRR, no MRG. No Carotid Bruits.  +peripheral edema, warm, nontender Eyes: Conjunctivae clear without exudates or hemorrhage  Neuro: Detailed Neurologic Exam  Speech:    Speech is normal; fluent and spontaneous with normal comprehension.  Cognition:    The patient is oriented to person, place, and time;     recent and remote memory intact;     language fluent;     normal attention, concentration,     fund of knowledge Cranial Nerves:    The pupils are equal, round, and reactive to light. Attempted funduscopic exam could not visualize. Visual fields are full to finger confrontation. Extraocular movements are intact. Trigeminal sensation is intact and the muscles of mastication are normal. The face is symmetric. The palate elevates in the midline. Hearing intact. Voice is normal. Shoulder shrug is normal. The tongue has normal motion without fasciculations.   Coordination:    No dysmetria  Gait:    Wide based sensory ataxia  Motor Observation:    no involuntary movements noted Tone:    Normal muscle tone.    Posture:    Posture is stooped    Strength: 4+/5 LE with 3/5 DF        Sensation: decreased pinrpick to knees and in hands, absent vibration and proprioception     Reflex Exam:  DTR's:    Deep tendon reflexes in the lower extremities are absent bilaterally.   Toes:    The toes are downgoing bilaterally.   Clonus:    Clonus is absent.      Assessment/Plan:  83 y.o. female here as a referral from Dr. Felipa Eth for chemotherapy-induced neuropathy. Discussed the etiology of this neuropathy, last treatment was several months ago and she may or may not improve in the next year. At this point management of pain in his treatment and fall prevention. Discussed falls, due to proprioceptive deficits in the feet. Advised at night's to always keep legs on so as not to fall. We'll referred for ambulatory physical therapy to help with strengthening, balance, fall risks in gait.  Home physical therapy  was helpful As far as your medications are concerned, I would like to suggest:  Lyrica is helping, increase to 100mg  twice daily and then can increase to 100mg  tid if needed  Sarina Ill, MD  [facility] [address] [place], [place] [address]  Phone [phone] Fax [phone]  A total of 15 minutes was spent face-to-face with this patient. Over half this time was spent on counseling patient on the neuropathy diagnosis and different diagnostic and therapeutic options available.

## 2017-11-04 NOTE — Telephone Encounter (Signed)
Lyrica prescription printed & signed by Dr. Jaynee Eagles. Faxed to [facility] pharmacy. Received a receipt of confirmation.

## 2017-11-04 NOTE — Patient Instructions (Signed)
Stop neurontin Continue Lyrica Pregabalin capsules What is this medicine? PREGABALIN (pre GAB a lin) is used to treat nerve pain from diabetes, shingles, spinal cord injury, and fibromyalgia. It is also used to control seizures in epilepsy. This medicine may be used for other purposes; ask your health care provider or pharmacist if you have questions. COMMON BRAND NAME(S): Lyrica What should I tell my health care provider before I take this medicine? They need to know if you have any of these conditions: -bleeding problems -heart disease, including heart failure -history of alcohol or drug abuse -kidney disease -suicidal thoughts, plans, or attempt; a previous suicide attempt by you or a family member -an unusual or allergic reaction to pregabalin, gabapentin, other medicines, foods, dyes, or preservatives -pregnant or trying to get pregnant or trying to conceive with your partner -breast-feeding How should I use this medicine? Take this medicine by mouth with a glass of water. Follow the directions on the prescription label. You can take this medicine with or without food. Take your doses at regular intervals. Do not take your medicine more often than directed. Do not stop taking except on your doctor's advice. A special MedGuide will be given to you by the pharmacist with each prescription and refill. Be sure to read this information carefully each time. Talk to your pediatrician regarding the use of this medicine in children. Special care may be needed. Overdosage: If you think you have taken too much of this medicine contact a poison control center or emergency room at once. NOTE: This medicine is only for you. Do not share this medicine with others. What if I miss a dose? If you miss a dose, take it as soon as you can. If it is almost time for your next dose, take only that dose. Do not take double or extra doses. What may interact with this medicine? -alcohol -certain medicines for  blood pressure like captopril, enalapril, or lisinopril -certain medicines for diabetes, like pioglitazone or rosiglitazone -certain medicines for anxiety or sleep -narcotic medicines for pain This list may not describe all possible interactions. Give your health care provider a list of all the medicines, herbs, non-prescription drugs, or dietary supplements you use. Also tell them if you smoke, drink alcohol, or use illegal drugs. Some items may interact with your medicine. What should I watch for while using this medicine? Tell your doctor or healthcare professional if your symptoms do not start to get better or if they get worse. Visit your doctor or health care professional for regular checks on your progress. Do not stop taking except on your doctor's advice. You may develop a severe reaction. Your doctor will tell you how much medicine to take. Wear a medical identification bracelet or chain if you are taking this medicine for seizures, and carry a card that describes your disease and details of your medicine and dosage times. You may get drowsy or dizzy. Do not drive, use machinery, or do anything that needs mental alertness until you know how this medicine affects you. Do not stand or sit up quickly, especially if you are an older patient. This reduces the risk of dizzy or fainting spells. Alcohol may interfere with the effect of this medicine. Avoid alcoholic drinks. If you have a heart condition, like congestive heart failure, and notice that you are retaining water and have swelling in your hands or feet, contact your health care provider immediately. The use of this medicine may increase the chance of suicidal thoughts or  actions. Pay special attention to how you are responding while on this medicine. Any worsening of mood, or thoughts of suicide or dying should be reported to your health care professional right away. This medicine has caused reduced sperm counts in some men. This may  interfere with the ability to father a child. You should talk to your doctor or health care professional if you are concerned about your fertility. Women who become pregnant while using this medicine for seizures may enroll in the [place] Pregnancy Registry by calling (930)[phone]. This registry collects information about the safety of antiepileptic drug use during pregnancy. What side effects may I notice from receiving this medicine? Side effects that you should report to your doctor or health care professional as soon as possible: -allergic reactions like skin rash, itching or hives, swelling of the face, lips, or tongue -breathing problems -changes in vision -chest pain -confusion -jerking or unusual movements of any part of your body -loss of memory -muscle pain, tenderness, or weakness -suicidal thoughts or other mood changes -swelling of the ankles, feet, hands -unusual bruising or bleeding Side effects that usually do not require medical attention (report to your doctor or health care professional if they continue or are bothersome): -dizziness -drowsiness -dry mouth -headache -nausea -tremors -trouble sleeping -weight gain This list may not describe all possible side effects. Call your doctor for medical advice about side effects. You may report side effects to FDA at 1-800-FDA-1088. Where should I keep my medicine? Keep out of the reach of children. This medicine can be abused. Keep your medicine in a safe place to protect it from theft. Do not share this medicine with anyone. Selling or giving away this medicine is dangerous and against the law. This medicine may cause accidental overdose and death if it taken by other adults, children, or pets. Mix any unused medicine with a substance like cat litter or coffee grounds. Then throw the medicine away in a sealed container like a sealed bag or a coffee can with a lid. Do not use the medicine after the  expiration date. Store at room temperature between 15 and 30 degrees C (59 and 86 degrees F). NOTE: This sheet is a summary. It may not cover all possible information. If you have questions about this medicine, talk to your doctor, pharmacist, or health care provider.  2018 Elsevier/Gold Standard (2015-12-28 10:26:12)

## 2017-11-07 ENCOUNTER — Other Ambulatory Visit: Payer: Self-pay | Admitting: *Deleted

## 2017-11-07 DIAGNOSIS — C3491 Malignant neoplasm of unspecified part of right bronchus or lung: Secondary | ICD-10-CM

## 2017-11-10 ENCOUNTER — Ambulatory Visit (HOSPITAL_BASED_OUTPATIENT_CLINIC_OR_DEPARTMENT_OTHER): Payer: Medicare Other

## 2017-11-10 ENCOUNTER — Encounter: Payer: Medicare Other | Admitting: *Deleted

## 2017-11-10 ENCOUNTER — Other Ambulatory Visit: Payer: Medicare Other

## 2017-11-10 DIAGNOSIS — Z452 Encounter for adjustment and management of vascular access device: Secondary | ICD-10-CM | POA: Diagnosis not present

## 2017-11-10 DIAGNOSIS — C3491 Malignant neoplasm of unspecified part of right bronchus or lung: Secondary | ICD-10-CM

## 2017-11-10 DIAGNOSIS — Z85118 Personal history of other malignant neoplasm of bronchus and lung: Secondary | ICD-10-CM | POA: Diagnosis not present

## 2017-11-10 DIAGNOSIS — Z95828 Presence of other vascular implants and grafts: Secondary | ICD-10-CM

## 2017-11-10 LAB — RESEARCH LABS

## 2017-11-10 MED ORDER — HEPARIN SOD (PORK) LOCK FLUSH 100 UNIT/ML IV SOLN
250.0000 [IU] | Freq: Once | INTRAVENOUS | Status: AC
  Administered 2017-11-10: 12:00:00
  Filled 2017-11-10: qty 5

## 2017-11-10 MED ORDER — SODIUM CHLORIDE 0.9% FLUSH
10.0000 mL | Freq: Once | INTRAVENOUS | Status: AC
  Administered 2017-11-10: 10 mL
  Filled 2017-11-10: qty 10

## 2017-12-22 ENCOUNTER — Inpatient Hospital Stay: Payer: Medicare Other | Attending: Internal Medicine

## 2017-12-22 DIAGNOSIS — Z452 Encounter for adjustment and management of vascular access device: Secondary | ICD-10-CM | POA: Diagnosis present

## 2017-12-22 DIAGNOSIS — C3491 Malignant neoplasm of unspecified part of right bronchus or lung: Secondary | ICD-10-CM

## 2017-12-22 DIAGNOSIS — Z95828 Presence of other vascular implants and grafts: Secondary | ICD-10-CM

## 2017-12-22 DIAGNOSIS — Z85118 Personal history of other malignant neoplasm of bronchus and lung: Secondary | ICD-10-CM | POA: Diagnosis present

## 2017-12-22 MED ORDER — SODIUM CHLORIDE 0.9% FLUSH
10.0000 mL | Freq: Once | INTRAVENOUS | Status: AC
  Administered 2017-12-22: 10 mL
  Filled 2017-12-22: qty 10

## 2017-12-22 MED ORDER — HEPARIN SOD (PORK) LOCK FLUSH 100 UNIT/ML IV SOLN
500.0000 [IU] | Freq: Once | INTRAVENOUS | Status: AC
  Administered 2017-12-22: 500 [IU]
  Filled 2017-12-22: qty 5

## 2018-02-02 ENCOUNTER — Ambulatory Visit (HOSPITAL_COMMUNITY)
Admission: RE | Admit: 2018-02-02 | Discharge: 2018-02-02 | Disposition: A | Discharge status: Home / Self Care | Payer: Medicare Other | Source: Ambulatory Visit | Attending: Internal Medicine | Admitting: Internal Medicine

## 2018-02-02 ENCOUNTER — Inpatient Hospital Stay: Payer: Medicare Other

## 2018-02-02 ENCOUNTER — Inpatient Hospital Stay: Payer: Medicare Other | Attending: Internal Medicine

## 2018-02-02 DIAGNOSIS — Z85118 Personal history of other malignant neoplasm of bronchus and lung: Secondary | ICD-10-CM | POA: Diagnosis present

## 2018-02-02 DIAGNOSIS — R590 Localized enlarged lymph nodes: Secondary | ICD-10-CM | POA: Insufficient documentation

## 2018-02-02 DIAGNOSIS — I700 Atherosclerosis of aorta: Secondary | ICD-10-CM | POA: Diagnosis not present

## 2018-02-02 DIAGNOSIS — Z452 Encounter for adjustment and management of vascular access device: Secondary | ICD-10-CM | POA: Diagnosis present

## 2018-02-02 DIAGNOSIS — C3490 Malignant neoplasm of unspecified part of unspecified bronchus or lung: Secondary | ICD-10-CM

## 2018-02-02 DIAGNOSIS — C3491 Malignant neoplasm of unspecified part of right bronchus or lung: Secondary | ICD-10-CM

## 2018-02-02 DIAGNOSIS — Z95828 Presence of other vascular implants and grafts: Secondary | ICD-10-CM

## 2018-02-02 LAB — CBC WITH DIFFERENTIAL/PLATELET
Basophils Absolute: 0 10*3/uL (ref 0.0–0.1)
Basophils Relative: 0 %
EOS PCT: 2 %
Eosinophils Absolute: 0.1 10*3/uL (ref 0.0–0.5)
HCT: 33.2 % — ABNORMAL LOW (ref 34.8–46.6)
Hemoglobin: 10.5 g/dL — ABNORMAL LOW (ref 11.6–15.9)
LYMPHS ABS: 1 10*3/uL (ref 0.9–3.3)
Lymphocytes Relative: 15 %
MCH: 28.5 pg (ref 25.1–34.0)
MCHC: 31.6 g/dL (ref 31.5–36.0)
MCV: 90 fL (ref 79.5–101.0)
MONO ABS: 0.8 10*3/uL (ref 0.1–0.9)
Monocytes Relative: 12 %
Neutro Abs: 4.9 10*3/uL (ref 1.5–6.5)
Neutrophils Relative %: 71 %
PLATELETS: 203 10*3/uL (ref 145–400)
RBC: 3.69 MIL/uL — AB (ref 3.70–5.45)
RDW: 16.5 % — AB (ref 11.2–14.5)
WBC: 6.9 10*3/uL (ref 3.9–10.3)

## 2018-02-02 LAB — COMPREHENSIVE METABOLIC PANEL
ALT: 12 U/L (ref 0–55)
ANION GAP: 11 (ref 3–11)
AST: 14 U/L (ref 5–34)
Albumin: 2.9 g/dL — ABNORMAL LOW (ref 3.5–5.0)
Alkaline Phosphatase: 62 U/L (ref 40–150)
BUN: 9 mg/dL (ref 7–26)
CHLORIDE: 103 mmol/L (ref 98–109)
CO2: 27 mmol/L (ref 22–29)
Calcium: 11.3 mg/dL — ABNORMAL HIGH (ref 8.4–10.4)
Creatinine, Ser: 0.76 mg/dL (ref 0.60–1.10)
Glucose, Bld: 130 mg/dL (ref 70–140)
POTASSIUM: 3.7 mmol/L (ref 3.5–5.1)
SODIUM: 141 mmol/L (ref 136–145)
Total Bilirubin: 0.5 mg/dL (ref 0.2–1.2)
Total Protein: 7.4 g/dL (ref 6.4–8.3)

## 2018-02-02 MED ORDER — IOPAMIDOL (ISOVUE-300) INJECTION 61%
INTRAVENOUS | Status: AC
  Filled 2018-02-02: qty 100

## 2018-02-02 MED ORDER — HEPARIN SOD (PORK) LOCK FLUSH 100 UNIT/ML IV SOLN
INTRAVENOUS | Status: AC
  Filled 2018-02-02: qty 5

## 2018-02-02 MED ORDER — HEPARIN SOD (PORK) LOCK FLUSH 100 UNIT/ML IV SOLN
500.0000 [IU] | Freq: Once | INTRAVENOUS | Status: AC
  Administered 2018-02-02: 500 [IU] via INTRAVENOUS

## 2018-02-02 MED ORDER — IOPAMIDOL (ISOVUE-300) INJECTION 61%
100.0000 mL | Freq: Once | INTRAVENOUS | Status: AC | PRN
  Administered 2018-02-02: 100 mL via INTRAVENOUS

## 2018-02-02 MED ORDER — SODIUM CHLORIDE 0.9% FLUSH
10.0000 mL | Freq: Once | INTRAVENOUS | Status: AC
  Administered 2018-02-02: 10 mL
  Filled 2018-02-02: qty 10

## 2018-02-03 ENCOUNTER — Other Ambulatory Visit: Payer: Medicare Other

## 2018-02-09 ENCOUNTER — Other Ambulatory Visit: Payer: Self-pay | Admitting: Internal Medicine

## 2018-02-09 ENCOUNTER — Telehealth: Payer: Self-pay | Admitting: Internal Medicine

## 2018-02-09 ENCOUNTER — Encounter: Payer: Self-pay | Admitting: Internal Medicine

## 2018-02-09 ENCOUNTER — Inpatient Hospital Stay: Payer: Medicare Other | Attending: Internal Medicine | Admitting: Internal Medicine

## 2018-02-09 VITALS — BP 132/57 | HR 77 | Temp 98.3°F | Resp 18 | Wt 147.1 lb

## 2018-02-09 DIAGNOSIS — R5382 Chronic fatigue, unspecified: Secondary | ICD-10-CM

## 2018-02-09 DIAGNOSIS — Z5112 Encounter for antineoplastic immunotherapy: Secondary | ICD-10-CM | POA: Diagnosis present

## 2018-02-09 DIAGNOSIS — C3490 Malignant neoplasm of unspecified part of unspecified bronchus or lung: Secondary | ICD-10-CM

## 2018-02-09 DIAGNOSIS — R079 Chest pain, unspecified: Secondary | ICD-10-CM | POA: Diagnosis not present

## 2018-02-09 DIAGNOSIS — E039 Hypothyroidism, unspecified: Secondary | ICD-10-CM | POA: Diagnosis not present

## 2018-02-09 DIAGNOSIS — R51 Headache: Secondary | ICD-10-CM | POA: Diagnosis not present

## 2018-02-09 DIAGNOSIS — R042 Hemoptysis: Secondary | ICD-10-CM | POA: Insufficient documentation

## 2018-02-09 DIAGNOSIS — C3411 Malignant neoplasm of upper lobe, right bronchus or lung: Secondary | ICD-10-CM | POA: Insufficient documentation

## 2018-02-09 DIAGNOSIS — R4182 Altered mental status, unspecified: Secondary | ICD-10-CM

## 2018-02-09 DIAGNOSIS — I480 Paroxysmal atrial fibrillation: Secondary | ICD-10-CM

## 2018-02-09 DIAGNOSIS — R531 Weakness: Secondary | ICD-10-CM | POA: Diagnosis present

## 2018-02-09 DIAGNOSIS — Z79899 Other long term (current) drug therapy: Secondary | ICD-10-CM

## 2018-02-09 DIAGNOSIS — G629 Polyneuropathy, unspecified: Secondary | ICD-10-CM | POA: Insufficient documentation

## 2018-02-09 DIAGNOSIS — Z7189 Other specified counseling: Secondary | ICD-10-CM

## 2018-02-09 DIAGNOSIS — R5383 Other fatigue: Secondary | ICD-10-CM | POA: Insufficient documentation

## 2018-02-09 NOTE — Progress Notes (Signed)
DISCONTINUE ON PATHWAY REGIMEN - Non-Small Cell Lung     A cycle is every 21 days:     Paclitaxel      Carboplatin   **Always confirm dose/schedule in your pharmacy ordering system**    REASON: Other Reason PRIOR TREATMENT: [id number]: Carboplatin + Paclitaxel q21 Days x 4 Cycles TREATMENT RESPONSE: Stable Disease (SD)  START ON PATHWAY REGIMEN - Non-Small Cell Lung     A cycle is 21 days:     Pembrolizumab   **Always confirm dose/schedule in your pharmacy ordering system**    Patient Characteristics: Stage IV Metastatic, Squamous, PS = 0, 1, Second Line, No Prior PD-1/PD-L1  Inhibitor and Immunotherapy Candidate AJCC T Category: T2b Current Disease Status: Distant Metastases AJCC N Category: N3 AJCC M Category: M1a AJCC 8 Stage Grouping: IVA Histology: Squamous Cell Line of therapy: Second Line PD-L1 Expression Status: PD-L1 Positive 1-49% (TPS) Performance Status: PS = 0, 1 Would you be surprised if this patient died  in the next year<= I would NOT be surprised if this patient died in the next year Immunotherapy Candidate Status: Candidate for Immunotherapy Prior Immunotherapy Status: No Prior PD-1/PD-L1 Inhibitor Intent of Therapy: Non-Curative / Palliative Intent, Discussed with Patient

## 2018-02-09 NOTE — Progress Notes (Signed)
[place] [phone]   [phone]  OFFICE PROGRESS NOTE  Lajean Manes, MD [address] [place] [address]  DIAGNOSIS: Stage IV (T2b, N3, M1 a) non-small cell lung cancer, squamous cell carcinoma presented with large right upper lobe lung mass in addition to bilateral mediastinal lymphadenopathy as well as left upper lobe lung nodule diagnosed in March 2018. PDL 1 expression is 5%.   PRIOR THERAPY: Systemic chemotherapy with carboplatin for AUC of 5 and paclitaxel 175 MG/M2 every 3 weeks. First dose 03/24/2017. Status post 5 cycles. Last cycle was given 06/18/2017. Discontinued secondary to entrance and significant peripheral neuropathy.  CURRENT THERAPY: Second line treatment with immunotherapy with Keytruda 200 mg IV every 3 weeks.  First dose February 17, 2018.  INTERVAL HISTORY: Maria Washington 84 y.o. female returns to the clinic today for follow-up visit accompanied by HER-2 daughters.  The patient continues to complain of increasing fatigue and weakness as well as pain on the right side of the chest.  She also has occasional episodes of hemoptysis but she is currently on Eliquis and her dose was reduced to 2.5 mg p.o. twice daily.  She denied having any nausea, vomiting, diarrhea or constipation.  She continues to have shortness of breath with exertion.  The patient has no recent weight loss or night sweats.  She has no fever or chills.  She also has intermittent headache.  She had repeat CT scan of the chest, abdomen and pelvis performed recently and she is here for evaluation and discussion of her scan results.  MEDICAL HISTORY: Past Medical History:  Diagnosis Date  . Aortic atherosclerosis ([place]) 02/10/2017  . Arthritis   . CAD (coronary artery disease), native coronary artery    3 vessel calcification noted on CT scan   . Cardiac pacemaker in situ 12/05/2011  . Chronic diastolic congestive heart failure ([place])   . COPD  (chronic obstructive pulmonary disease) ([facility])    spot on lung  . Dehydration 06/04/2017  . Encounter for antineoplastic chemotherapy 03/04/2017  . Goals of care, counseling/discussion 03/17/2017  . History of breast cancer    Treated with lumpectomy, radiation, and chemo sees Dr. Ralene Ok   . Hypertensive heart disease without CHF   . Hypokalemia 04/18/2017  . Hypothyroid   . Mitral regurgitation   . Paroxysmal atrial fibrillation ([facility])    CHA2DS2VASC score 5  . Personal history of chemotherapy   . Personal history of radiation therapy   . Second degree heart block   . Stage IV squamous cell carcinoma of right lung ([place]) 03/04/2017    ALLERGIES:  is allergic to clindamycin/lincomycin and penicillins.  MEDICATIONS:  Current Outpatient Medications  Medication Sig Dispense Refill  . alendronate (FOSAMAX) 70 MG tablet Take 70 mg by mouth every Monday.     Marland Kitchen apixaban (ELIQUIS) 5 MG TABS tablet Take 1 tablet (5 mg total) by mouth 2 (two) times daily. 60 tablet 3  . calcium-vitamin D (OSCAL WITH D) 500-200 MG-UNIT per tablet Take 1 tablet by mouth 2 (two) times daily.      . cholecalciferol (VITAMIN D) 1000 units tablet Take 1,000 Units by mouth daily.     Marland Kitchen diltiazem (CARDIZEM CD) 120 MG 24 hr capsule Take 120 mg by mouth daily.  3  . furosemide (LASIX) 40 MG tablet Take 40 mg by mouth daily.     Marland Kitchen levothyroxine (SYNTHROID, LEVOTHROID) 137 MCG tablet Take 137 mcg by mouth  daily before breakfast.    . lidocaine-prilocaine (EMLA) cream Apply 1 application topically as needed. 30 g 0  . Omega-3 Fatty Acids (FISH OIL) 1200 MG CAPS Take 1,200 mg by mouth 2 (two) times daily.     . potassium chloride SA (K-DUR,KLOR-CON) 20 MEQ tablet Take 40 mEq by mouth 2 (two) times daily.     . pregabalin (LYRICA) 100 MG capsule Take 1 capsule (100 mg total) by mouth 2 (two) times daily. 180 capsule 4  . vitamin B-12 (CYANOCOBALAMIN) 1000 MCG tablet Take 1,000 mcg by mouth daily.      . diphenoxylate-atropine  (LOMOTIL) 2.5-0.025 MG tablet Take 1 tablet by mouth 4 (four) times daily as needed for diarrhea or loose stools. May take qid if imodium is not working. (Patient not taking: Reported on 02/09/2018) 30 tablet 0  . folic acid (FOLVITE) 1 MG tablet Take 1 mg by mouth daily.      Marland Kitchen loperamide (IMODIUM) 2 MG capsule Take 2-4 mg by mouth 4 (four) times daily as needed for diarrhea or loose stools.     Marland Kitchen oxyCODONE-acetaminophen (PERCOCET/ROXICET) 5-325 MG tablet Take 1 tablet by mouth every 6 (six) hours as needed for severe pain. (Patient not taking: Reported on 02/09/2018) 30 tablet 0  . prochlorperazine (COMPAZINE) 10 MG tablet Take 1 tablet (10 mg total) by mouth every 6 (six) hours as needed for nausea or vomiting. (Patient not taking: Reported on 02/09/2018) 30 tablet 0   No current facility-administered medications for this visit.     SURGICAL HISTORY:  Past Surgical History:  Procedure Laterality Date  . ABDOMINAL HYSTERECTOMY    . BLADDER SUSPENSION    . BREAST LUMPECTOMY Left    lumpectomy  . CARPAL TUNNEL RELEASE     left wrist  . CHOLECYSTECTOMY    . FINGER SURGERY    . IR FLUORO GUIDE PORT INSERTION RIGHT  03/12/2017  . IR US GUIDE VASC ACCESS RIGHT  03/12/2017  . PERMANENT PACEMAKER INSERTION N/A 12/04/2011   Procedure: PERMANENT PACEMAKER INSERTION;  Surgeon: Evans Lance, MD;  Location: [facility] CATH LAB;  Service: Cardiovascular;  Laterality: N/A;  . SHOULDER SURGERY    . TONSILLECTOMY AND ADENOIDECTOMY      REVIEW OF SYSTEMS:  Constitutional: positive for fatigue Eyes: negative Ears, nose, mouth, throat, and face: negative Respiratory: positive for cough, dyspnea on exertion, hemoptysis and pleurisy/chest pain Cardiovascular: negative Gastrointestinal: negative Genitourinary:negative Integument/breast: negative Hematologic/lymphatic: negative Musculoskeletal:negative Neurological: negative Behavioral/Psych: negative Endocrine: negative Allergic/Immunologic: negative    PHYSICAL EXAMINATION: General appearance: alert, cooperative, fatigued and no distress Head: Normocephalic, without obvious abnormality, atraumatic Neck: no adenopathy, no JVD, supple, symmetrical, trachea midline and thyroid not enlarged, symmetric, no tenderness/mass/nodules Lymph nodes: Cervical, supraclavicular, and axillary nodes normal. Resp: diminished breath sounds RLL and dullness to percussion RLL Back: symmetric, no curvature. ROM normal. No CVA tenderness. Cardio: regular rate and rhythm, S1, S2 normal, no murmur, click, rub or gallop GI: soft, non-tender; bowel sounds normal; no masses,  no organomegaly Extremities: extremities normal, atraumatic, no cyanosis or edema Neurologic: Alert and oriented X 3, normal strength and tone. Normal symmetric reflexes. Normal coordination and gait  ECOG PERFORMANCE STATUS: 1 - Symptomatic but completely ambulatory  Blood pressure (!) 132/57, pulse 77, temperature 98.3 F (36.8 C), temperature source Oral, resp. rate 18, weight 147 lb 1.6 oz (66.7 kg), SpO2 97 %.  LABORATORY DATA: Lab Results  Component Value Date   WBC 6.9 02/02/2018   HGB 10.5 (L) 02/02/2018  HCT 33.2 (L) 02/02/2018   MCV 90.0 02/02/2018   PLT 203 02/02/2018      Chemistry      Component Value Date/Time   NA 141 02/02/2018 1116   NA 141 11/03/2017 0834   K 3.7 02/02/2018 1116   K 3.9 11/03/2017 0834   CL 103 02/02/2018 1116   CO2 27 02/02/2018 1116   CO2 23 11/03/2017 0834   BUN 9 02/02/2018 1116   BUN 10.1 11/03/2017 0834   CREATININE 0.76 02/02/2018 1116   CREATININE 0.8 11/03/2017 0834      Component Value Date/Time   CALCIUM 11.3 (H) 02/02/2018 1116   CALCIUM 9.6 11/03/2017 0834   ALKPHOS 62 02/02/2018 1116   ALKPHOS 61 11/03/2017 0834   AST 14 02/02/2018 1116   AST 13 11/03/2017 0834   ALT 12 02/02/2018 1116   ALT 11 11/03/2017 0834   BILITOT 0.5 02/02/2018 1116   BILITOT 0.31 11/03/2017 0834       RADIOGRAPHIC STUDIES: Ct Chest W  Contrast  Result Date: 02/02/2018 CLINICAL DATA:  Left breast cancer diagnosed in 2002 with lumpectomy, chemotherapy and radiation therapy. Stage IV right upper lobe lung cancer diagnosed in March 2018. EXAM: CT CHEST, ABDOMEN, AND PELVIS WITH CONTRAST TECHNIQUE: Multidetector CT imaging of the chest, abdomen and pelvis was performed following the standard protocol during bolus administration of intravenous contrast. CONTRAST:  146m ISOVUE-300 IOPAMIDOL (ISOVUE-300) INJECTION 61% COMPARISON:  CT 09/25/2017 and 07/25/2017. FINDINGS: CT CHEST FINDINGS Cardiovascular: Diffuse atherosclerosis of the aorta, great vessels and coronary arteries. No acute vascular findings are evident. Right IJ Port-A-Cath extends to the superior cavoatrial junction. Pacemaker leads appear unchanged. Stable mild cardiomegaly without pericardial effusion. Mediastinum/Nodes: Mediastinal lymphadenopathy has progressed. Right paratracheal node measures 12 mm on image 18/2 (previously 10 mm). There are enlarging nodes in the AP window measuring up to 3.5 x 1.9 cm on image 18. Prevascular node measures 12 mm on image 18. No definite hilar or axillary adenopathy status post left axillary node dissection. The thyroid gland, trachea and esophagus demonstrate no significant findings. Lungs/Pleura: There is no pleural effusion. Progressive enlargement of thick-walled cavitary right upper lobe mass, now measuring up to 7.1 x 5.6 cm on image 18/2 (previously 2.7 cm maximally). There are surrounding pulmonary opacities and bronchiectasis, extending into the superior segment of the right lower lobe. This mass demonstrates possible intercostal extension the left 4th rib space posteromedially (sagittal image 68). There is possible minimal foraminal extension of tumor as well. No definite intraspinal component. There are numerous scattered tiny pulmonary nodules bilaterally which are stable. There is stable emphysema and subpleural scarring anteriorly in  the left upper lobe, the latter to be due to remote radiation therapy for breast cancer. Musculoskeletal/Chest wall: As above, the dominant right upper lobe mass may extend into upper chest wall posteriorly. No rib destruction or intraspinal extension seen. No other osseous abnormalities. CT ABDOMEN AND PELVIS FINDINGS Hepatobiliary: The liver is normal in density without focal abnormality. There is stable mild intra and extrahepatic biliary dilatation status post cholecystectomy. Pancreas: Unremarkable. No pancreatic ductal dilatation or surrounding inflammatory changes. Spleen: Numerous small low-density splenic lesions are similar to the recent prior studies, likely incidental. The spleen is normal in size. Adrenals/Urinary Tract: Both adrenal glands appear normal. Stable dominant cyst involving the upper interpolar region of the left kidney and additional probable tiny cysts bilaterally. No urinary tract calculus or hydronephrosis. Possible small right-sided bladder diverticulum. The bladder otherwise appears normal. Stomach/Bowel: No evidence of bowel  wall thickening, distention or surrounding inflammatory change. Vascular/Lymphatic: There are no enlarged abdominal or pelvic lymph nodes. Aortic and branch vessel atherosclerosis and mild dilatation of the abdominal aorta are stable. No acute vascular findings. Reproductive: Hysterectomy.  No evidence of adnexal mass. Other: Intact anterior abdominal wall. No ascites or peritoneal nodularity. Musculoskeletal: No acute or significant osseous findings. Grossly stable degenerative changes in the spine. IMPRESSION: 1. Significant interval enlargement of dominant, cavitary right upper lobe mass. Appearance most likely represents tumor progression, although superimposed infection is a possibility. This mass demonstrates intercostal and possible mild neural foraminal extension. 2. Progressive mediastinal lymphadenopathy, likely tumor progression. 3. Stable tiny  pulmonary nodules bilaterally. 4. No evidence of metastatic disease in the abdomen or pelvis. 5.  Aortic Atherosclerosis (ICD10-I70.0). Electronically Signed   By: Richardean Sale M.D.   On: 02/02/2018 14:58   Ct Abdomen Pelvis W Contrast  Result Date: 02/02/2018 CLINICAL DATA:  Left breast cancer diagnosed in 2002 with lumpectomy, chemotherapy and radiation therapy. Stage IV right upper lobe lung cancer diagnosed in March 2018. EXAM: CT CHEST, ABDOMEN, AND PELVIS WITH CONTRAST TECHNIQUE: Multidetector CT imaging of the chest, abdomen and pelvis was performed following the standard protocol during bolus administration of intravenous contrast. CONTRAST:  121m ISOVUE-300 IOPAMIDOL (ISOVUE-300) INJECTION 61% COMPARISON:  CT 09/25/2017 and 07/25/2017. FINDINGS: CT CHEST FINDINGS Cardiovascular: Diffuse atherosclerosis of the aorta, great vessels and coronary arteries. No acute vascular findings are evident. Right IJ Port-A-Cath extends to the superior cavoatrial junction. Pacemaker leads appear unchanged. Stable mild cardiomegaly without pericardial effusion. Mediastinum/Nodes: Mediastinal lymphadenopathy has progressed. Right paratracheal node measures 12 mm on image 18/2 (previously 10 mm). There are enlarging nodes in the AP window measuring up to 3.5 x 1.9 cm on image 18. Prevascular node measures 12 mm on image 18. No definite hilar or axillary adenopathy status post left axillary node dissection. The thyroid gland, trachea and esophagus demonstrate no significant findings. Lungs/Pleura: There is no pleural effusion. Progressive enlargement of thick-walled cavitary right upper lobe mass, now measuring up to 7.1 x 5.6 cm on image 18/2 (previously 2.7 cm maximally). There are surrounding pulmonary opacities and bronchiectasis, extending into the superior segment of the right lower lobe. This mass demonstrates possible intercostal extension the left 4th rib space posteromedially (sagittal image 68). There is  possible minimal foraminal extension of tumor as well. No definite intraspinal component. There are numerous scattered tiny pulmonary nodules bilaterally which are stable. There is stable emphysema and subpleural scarring anteriorly in the left upper lobe, the latter to be due to remote radiation therapy for breast cancer. Musculoskeletal/Chest wall: As above, the dominant right upper lobe mass may extend into upper chest wall posteriorly. No rib destruction or intraspinal extension seen. No other osseous abnormalities. CT ABDOMEN AND PELVIS FINDINGS Hepatobiliary: The liver is normal in density without focal abnormality. There is stable mild intra and extrahepatic biliary dilatation status post cholecystectomy. Pancreas: Unremarkable. No pancreatic ductal dilatation or surrounding inflammatory changes. Spleen: Numerous small low-density splenic lesions are similar to the recent prior studies, likely incidental. The spleen is normal in size. Adrenals/Urinary Tract: Both adrenal glands appear normal. Stable dominant cyst involving the upper interpolar region of the left kidney and additional probable tiny cysts bilaterally. No urinary tract calculus or hydronephrosis. Possible small right-sided bladder diverticulum. The bladder otherwise appears normal. Stomach/Bowel: No evidence of bowel wall thickening, distention or surrounding inflammatory change. Vascular/Lymphatic: There are no enlarged abdominal or pelvic lymph nodes. Aortic and branch vessel atherosclerosis and mild  dilatation of the abdominal aorta are stable. No acute vascular findings. Reproductive: Hysterectomy.  No evidence of adnexal mass. Other: Intact anterior abdominal wall. No ascites or peritoneal nodularity. Musculoskeletal: No acute or significant osseous findings. Grossly stable degenerative changes in the spine. IMPRESSION: 1. Significant interval enlargement of dominant, cavitary right upper lobe mass. Appearance most likely represents tumor  progression, although superimposed infection is a possibility. This mass demonstrates intercostal and possible mild neural foraminal extension. 2. Progressive mediastinal lymphadenopathy, likely tumor progression. 3. Stable tiny pulmonary nodules bilaterally. 4. No evidence of metastatic disease in the abdomen or pelvis. 5.  Aortic Atherosclerosis (ICD10-I70.0). Electronically Signed   By: Richardean Sale M.D.   On: 02/02/2018 14:58    ASSESSMENT AND PLAN:  This is a very pleasant 85 years old white female with a stage IV non-small cell lung cancer, adenocarcinoma with PDL 1 expression of 5%. The patient underwent systemic chemotherapy with carboplatin and paclitaxel, status post 5 cycles and has been tolerating her treatment well except for increasing fatigue and weakness as well as development of peripheral neuropathy. Last dose of chemotherapy was in July 2018.   The patient has been in observation since that time. Repeat CT scan of the chest, abdomen and pelvis performed recently showed evidence for disease progression with enlargement of the dominant cavitary right upper lobe lung mass as well as mediastinal lymphadenopathy. I personally and independently reviewed the scan images and discussed the results with the patient and her family.  I discussed with the patient several options for management of her condition including palliative care versus consideration of treatment with palliative immunotherapy with Keytruda 200 mg IV every 3 weeks. The patient is interested in treatment.  I discussed with her the adverse effect of this treatment including but not limited to immunotherapy mediated skin rash, diarrhea, inflammation of the lung, kidney, liver, thyroid or other endocrine dysfunction. She is expected to start the first dose of this treatment on February 17, 2018. For pain management the patient is currently on Lyrica and she is not requesting any pain medication. Her hemoptysis most likely  secondary to her treatment with Eliquis in addition to the progressive lung cancer.  We will continue to monitor closely and refer to radiation oncology or hold her Eliquis if it is getting worse. For the headache and mental status change, I would order MRI of the brain to rule out brain metastasis. The patient will come back for follow-up visit in 4 weeks for evaluation before starting cycle #2. She was advised to call immediately if she has any concerning symptoms in the interval. The patient voices understanding of current disease status and treatment options and is in agreement with the current care plan. All questions were answered. The patient knows to call the clinic with any problems, questions or concerns. We can certainly see the patient much sooner if necessary.  Disclaimer: This note was dictated with voice recognition software. Similar sounding words can inadvertently be transcribed and may not be corrected upon review.

## 2018-02-09 NOTE — Telephone Encounter (Signed)
Appointments scheduled AVS/Calendar printed per 3/4 los

## 2018-02-13 ENCOUNTER — Ambulatory Visit (HOSPITAL_COMMUNITY)
Admission: RE | Admit: 2018-02-13 | Discharge: 2018-02-13 | Disposition: A | Discharge status: Home / Self Care | Payer: Medicare Other | Source: Ambulatory Visit | Attending: Internal Medicine | Admitting: Internal Medicine

## 2018-02-13 DIAGNOSIS — C3490 Malignant neoplasm of unspecified part of unspecified bronchus or lung: Secondary | ICD-10-CM

## 2018-02-13 DIAGNOSIS — C3411 Malignant neoplasm of upper lobe, right bronchus or lung: Secondary | ICD-10-CM | POA: Insufficient documentation

## 2018-02-13 DIAGNOSIS — G319 Degenerative disease of nervous system, unspecified: Secondary | ICD-10-CM | POA: Diagnosis not present

## 2018-02-13 DIAGNOSIS — I998 Other disorder of circulatory system: Secondary | ICD-10-CM | POA: Insufficient documentation

## 2018-02-13 MED ORDER — HEPARIN SOD (PORK) LOCK FLUSH 100 UNIT/ML IV SOLN
INTRAVENOUS | Status: AC
  Filled 2018-02-13: qty 5

## 2018-02-13 MED ORDER — SODIUM CHLORIDE 0.9 % IJ SOLN
INTRAMUSCULAR | Status: AC
  Filled 2018-02-13: qty 50

## 2018-02-13 MED ORDER — IOPAMIDOL (ISOVUE-300) INJECTION 61%
75.0000 mL | Freq: Once | INTRAVENOUS | Status: AC | PRN
  Administered 2018-02-13: 75 mL via INTRAVENOUS

## 2018-02-13 MED ORDER — HEPARIN SOD (PORK) LOCK FLUSH 100 UNIT/ML IV SOLN
500.0000 [IU] | Freq: Once | INTRAVENOUS | Status: AC
  Administered 2018-02-13: 500 [IU] via INTRAVENOUS

## 2018-02-13 MED ORDER — IOPAMIDOL (ISOVUE-300) INJECTION 61%
INTRAVENOUS | Status: AC
  Filled 2018-02-13: qty 75

## 2018-02-17 ENCOUNTER — Inpatient Hospital Stay: Payer: Medicare Other

## 2018-02-17 VITALS — BP 124/65 | HR 74 | Temp 98.0°F | Resp 16

## 2018-02-17 DIAGNOSIS — Z95828 Presence of other vascular implants and grafts: Secondary | ICD-10-CM

## 2018-02-17 DIAGNOSIS — C3490 Malignant neoplasm of unspecified part of unspecified bronchus or lung: Secondary | ICD-10-CM

## 2018-02-17 DIAGNOSIS — C3491 Malignant neoplasm of unspecified part of right bronchus or lung: Secondary | ICD-10-CM

## 2018-02-17 DIAGNOSIS — Z5112 Encounter for antineoplastic immunotherapy: Secondary | ICD-10-CM | POA: Diagnosis not present

## 2018-02-17 DIAGNOSIS — R5382 Chronic fatigue, unspecified: Secondary | ICD-10-CM

## 2018-02-17 LAB — CMP (CANCER CENTER ONLY)
ALBUMIN: 2.9 g/dL — AB (ref 3.5–5.0)
ALK PHOS: 66 U/L (ref 40–150)
ALT: 13 U/L (ref 0–55)
AST: 14 U/L (ref 5–34)
Anion gap: 9 (ref 3–11)
BILIRUBIN TOTAL: 0.3 mg/dL (ref 0.2–1.2)
BUN: 9 mg/dL (ref 7–26)
CALCIUM: 10.1 mg/dL (ref 8.4–10.4)
CO2: 26 mmol/L (ref 22–29)
CREATININE: 0.75 mg/dL (ref 0.60–1.10)
Chloride: 104 mmol/L (ref 98–109)
GFR, Estimated: >60 mL/min (ref >60)
GLUCOSE: 118 mg/dL (ref 70–140)
Potassium: 3.3 mmol/L — ABNORMAL LOW (ref 3.5–5.1)
SODIUM: 139 mmol/L (ref 136–145)
TOTAL PROTEIN: 7.3 g/dL (ref 6.4–8.3)

## 2018-02-17 LAB — TSH: TSH: 0.197 u[IU]/mL — ABNORMAL LOW (ref 0.308–3.960)

## 2018-02-17 LAB — CBC WITH DIFFERENTIAL (CANCER CENTER ONLY)
BASOS PCT: 1 %
Basophils Absolute: 0 10*3/uL (ref 0.0–0.1)
EOS ABS: 0.1 10*3/uL (ref 0.0–0.5)
Eosinophils Relative: 2 %
HCT: 32 % — ABNORMAL LOW (ref 34.8–46.6)
Hemoglobin: 10.3 g/dL — ABNORMAL LOW (ref 11.6–15.9)
Lymphocytes Relative: 16 %
Lymphs Abs: 1.1 10*3/uL (ref 0.9–3.3)
MCH: 28.1 pg (ref 25.1–34.0)
MCHC: 32.2 g/dL (ref 31.5–36.0)
MCV: 87.2 fL (ref 79.5–101.0)
MONO ABS: 0.8 10*3/uL (ref 0.1–0.9)
MONOS PCT: 12 %
Neutro Abs: 4.7 10*3/uL (ref 1.5–6.5)
Neutrophils Relative %: 69 %
PLATELETS: 256 10*3/uL (ref 145–400)
RBC: 3.67 MIL/uL — ABNORMAL LOW (ref 3.70–5.45)
RDW: 18.3 % — AB (ref 11.2–14.5)
WBC Count: 6.8 10*3/uL (ref 3.9–10.3)

## 2018-02-17 MED ORDER — PROCHLORPERAZINE MALEATE 10 MG PO TABS: 10.0000 mg | ORAL_TABLET | Freq: Once | ORAL | Status: DC

## 2018-02-17 MED ORDER — SODIUM CHLORIDE 0.9 % IV SOLN
Freq: Once | INTRAVENOUS | Status: AC
  Administered 2018-02-17: 14:00:00 via INTRAVENOUS

## 2018-02-17 MED ORDER — SODIUM CHLORIDE 0.9% FLUSH
10.0000 mL | Freq: Once | INTRAVENOUS | Status: AC
  Administered 2018-02-17: 10 mL
  Filled 2018-02-17: qty 10

## 2018-02-17 MED ORDER — PROCHLORPERAZINE MALEATE 10 MG PO TABS
ORAL_TABLET | ORAL | Status: AC
  Filled 2018-02-17: qty 1

## 2018-02-17 MED ORDER — HEPARIN SOD (PORK) LOCK FLUSH 100 UNIT/ML IV SOLN
500.0000 [IU] | Freq: Once | INTRAVENOUS | Status: AC | PRN
  Administered 2018-02-17: 500 [IU]
  Filled 2018-02-17: qty 5

## 2018-02-17 MED ORDER — SODIUM CHLORIDE 0.9% FLUSH
10.0000 mL | INTRAVENOUS | Status: DC | PRN
  Administered 2018-02-17: 10 mL
  Filled 2018-02-17: qty 10

## 2018-02-17 MED ORDER — SODIUM CHLORIDE 0.9 % IV SOLN
200.0000 mg | Freq: Once | INTRAVENOUS | Status: AC
  Administered 2018-02-17: 200 mg via INTRAVENOUS
  Filled 2018-02-17: qty 8

## 2018-02-17 NOTE — Patient Instructions (Addendum)
[place] Discharge Instructions for Patients Receiving Chemotherapy  Today you received the following chemotherapy agents Keytruda.  To help prevent nausea and vomiting after your treatment, we encourage you to take your nausea medication as directed.   If you develop nausea and vomiting that is not controlled by your nausea medication, call the clinic.   BELOW ARE SYMPTOMS THAT SHOULD BE REPORTED IMMEDIATELY:  *FEVER GREATER THAN 100.5 F  *CHILLS WITH OR WITHOUT FEVER  NAUSEA AND VOMITING THAT IS NOT CONTROLLED WITH YOUR NAUSEA MEDICATION  *UNUSUAL SHORTNESS OF BREATH  *UNUSUAL BRUISING OR BLEEDING  TENDERNESS IN MOUTH AND THROAT WITH OR WITHOUT PRESENCE OF ULCERS  *URINARY PROBLEMS  *BOWEL PROBLEMS  UNUSUAL RASH Items with * indicate a potential emergency and should be followed up as soon as possible.  Feel free to call the clinic should you have any questions or concerns. The clinic phone number is [phone].  Please show the [facility] at check-in to the Emergency Department and triage nurse.  Pembrolizumab injection What is this medicine? PEMBROLIZUMAB (pem broe liz ue mab) is a monoclonal antibody. It is used to treat melanoma, head and neck cancer, Hodgkin lymphoma, non-small cell lung cancer, urothelial cancer, stomach cancer, and cancers that have a certain genetic condition. This medicine may be used for other purposes; ask your health care provider or pharmacist if you have questions. COMMON BRAND NAME(S): Keytruda What should I tell my health care provider before I take this medicine? They need to know if you have any of these conditions: -diabetes -immune system problems -inflammatory bowel disease -liver disease -lung or breathing disease -lupus -organ transplant -an unusual or allergic reaction to pembrolizumab, other medicines, foods, dyes, or preservatives -pregnant or trying to get pregnant -breast-feeding How should I  use this medicine? This medicine is for infusion into a vein. It is given by a health care professional in a hospital or clinic setting. A special MedGuide will be given to you before each treatment. Be sure to read this information carefully each time. Talk to your pediatrician regarding the use of this medicine in children. While this drug may be prescribed for selected conditions, precautions do apply. Overdosage: If you think you have taken too much of this medicine contact a poison control center or emergency room at once. NOTE: This medicine is only for you. Do not share this medicine with others. What if I miss a dose? It is important not to miss your dose. Call your doctor or health care professional if you are unable to keep an appointment. What may interact with this medicine? Interactions have not been studied. Give your health care provider a list of all the medicines, herbs, non-prescription drugs, or dietary supplements you use. Also tell them if you smoke, drink alcohol, or use illegal drugs. Some items may interact with your medicine. This list may not describe all possible interactions. Give your health care provider a list of all the medicines, herbs, non-prescription drugs, or dietary supplements you use. Also tell them if you smoke, drink alcohol, or use illegal drugs. Some items may interact with your medicine. What should I watch for while using this medicine? Your condition will be monitored carefully while you are receiving this medicine. You may need blood work done while you are taking this medicine. Do not become pregnant while taking this medicine or for 4 months after stopping it. Women should inform their doctor if they wish to become pregnant or think they might  be pregnant. There is a potential for serious side effects to an unborn child. Talk to your health care professional or pharmacist for more information. Do not breast-feed an infant while taking this medicine or  for 4 months after the last dose. What side effects may I notice from receiving this medicine? Side effects that you should report to your doctor or health care professional as soon as possible: -allergic reactions like skin rash, itching or hives, swelling of the face, lips, or tongue -bloody or black, tarry -breathing problems -changes in vision -chest pain -chills -constipation -cough -dizziness or feeling faint or lightheaded -fast or irregular heartbeat -fever -flushing -hair loss -low blood counts - this medicine may decrease the number of white blood cells, red blood cells and platelets. You may be at increased risk for infections and bleeding. -muscle pain -muscle weakness -persistent headache -signs and symptoms of high blood sugar such as dizziness; dry mouth; dry skin; fruity breath; nausea; stomach pain; increased hunger or thirst; increased urination -signs and symptoms of kidney injury like trouble passing urine or change in the amount of urine -signs and symptoms of liver injury like dark urine, light-colored stools, loss of appetite, nausea, right upper belly pain, yellowing of the eyes or skin -stomach pain -sweating -weight loss Side effects that usually do not require medical attention (report to your doctor or health care professional if they continue or are bothersome): -decreased appetite -diarrhea -tiredness This list may not describe all possible side effects. Call your doctor for medical advice about side effects. You may report side effects to FDA at 1-800-FDA-1088. Where should I keep my medicine? This drug is given in a hospital or clinic and will not be stored at home. NOTE: This sheet is a summary. It may not cover all possible information. If you have questions about this medicine, talk to your doctor, pharmacist, or health care provider.  2018 [employer] Standard (2016-09-03 12:29:36)

## 2018-02-17 NOTE — Progress Notes (Signed)
Ok per Dr. Julien Nordmann to treat without lab results for first treatment.  Also, per Dr. Julien Nordmann ok to hold Compazine for treatment.

## 2018-02-19 ENCOUNTER — Telehealth: Payer: Self-pay | Admitting: *Deleted

## 2018-02-19 NOTE — Telephone Encounter (Signed)
-----   Message from Rennis Harding, RN sent at 02/17/2018  2:52 PM EDT ----- Regarding: Dr. Julien Nordmann 1st time chemo f/u 1st time chemo f/u from 02/17/2018

## 2018-02-19 NOTE — Telephone Encounter (Signed)
TCT patient's home and cell # for follow up to her 1st treatment of Keytruda.  No answer. TCT patient's daughter, Blanch Media. Spoke with joyce. She states her mom is doing preety good, getting around ok for 84 years old.  No c/o adverse effects from her Keytruda.  She understands to call with any problems or concerns.

## 2018-02-23 ENCOUNTER — Telehealth: Payer: Self-pay | Admitting: *Deleted

## 2018-02-23 NOTE — Telephone Encounter (Signed)
Faxed [facility] to [facility]; Attn: Philip Coordinator; Release ID # [id number]

## 2018-02-25 ENCOUNTER — Inpatient Hospital Stay (HOSPITAL_BASED_OUTPATIENT_CLINIC_OR_DEPARTMENT_OTHER): Payer: Medicare Other | Admitting: Medical

## 2018-02-25 ENCOUNTER — Inpatient Hospital Stay: Payer: Medicare Other

## 2018-02-25 ENCOUNTER — Other Ambulatory Visit: Payer: Self-pay | Admitting: *Deleted

## 2018-02-25 ENCOUNTER — Telehealth: Payer: Self-pay

## 2018-02-25 VITALS — BP 134/66 | HR 84 | Temp 98.0°F | Resp 19 | Ht 60.0 in | Wt 145.8 lb

## 2018-02-25 DIAGNOSIS — R531 Weakness: Secondary | ICD-10-CM | POA: Diagnosis not present

## 2018-02-25 DIAGNOSIS — Z5112 Encounter for antineoplastic immunotherapy: Secondary | ICD-10-CM | POA: Diagnosis not present

## 2018-02-25 DIAGNOSIS — G629 Polyneuropathy, unspecified: Secondary | ICD-10-CM

## 2018-02-25 DIAGNOSIS — C3491 Malignant neoplasm of unspecified part of right bronchus or lung: Secondary | ICD-10-CM | POA: Diagnosis not present

## 2018-02-25 LAB — CBC WITH DIFFERENTIAL (CANCER CENTER ONLY)
BASOS ABS: 0 10*3/uL (ref 0.0–0.1)
Basophils Relative: 0 %
Eosinophils Absolute: 0.2 10*3/uL (ref 0.0–0.5)
Eosinophils Relative: 2 %
HCT: 34 % — ABNORMAL LOW (ref 34.8–46.6)
HEMOGLOBIN: 10.9 g/dL — AB (ref 11.6–15.9)
LYMPHS PCT: 13 %
Lymphs Abs: 1 10*3/uL (ref 0.9–3.3)
MCH: 28.7 pg (ref 25.1–34.0)
MCHC: 32.1 g/dL (ref 31.5–36.0)
MCV: 89.5 fL (ref 79.5–101.0)
MONO ABS: 1.1 10*3/uL — AB (ref 0.1–0.9)
Monocytes Relative: 14 %
NEUTROS ABS: 5.4 10*3/uL (ref 1.5–6.5)
NEUTROS PCT: 71 %
Platelet Count: 220 10*3/uL (ref 145–400)
RBC: 3.8 MIL/uL (ref 3.70–5.45)
RDW: 17.9 % — ABNORMAL HIGH (ref 11.2–14.5)
WBC Count: 7.6 10*3/uL (ref 3.9–10.3)

## 2018-02-25 LAB — CMP (CANCER CENTER ONLY)
ALBUMIN: 3.1 g/dL — AB (ref 3.5–5.0)
ALT: 11 U/L (ref 0–55)
ANION GAP: 11 (ref 3–11)
AST: 14 U/L (ref 5–34)
Alkaline Phosphatase: 67 U/L (ref 40–150)
BILIRUBIN TOTAL: 0.5 mg/dL (ref 0.2–1.2)
BUN: 11 mg/dL (ref 7–26)
CO2: 23 mmol/L (ref 22–29)
Calcium: 11.5 mg/dL — ABNORMAL HIGH (ref 8.4–10.4)
Chloride: 105 mmol/L (ref 98–109)
Creatinine: 0.81 mg/dL (ref 0.60–1.10)
GFR, Est AFR Am: >60 mL/min (ref >60)
GFR, Estimated: >60 mL/min (ref >60)
GLUCOSE: 106 mg/dL (ref 70–140)
POTASSIUM: 3.8 mmol/L (ref 3.5–5.1)
Sodium: 139 mmol/L (ref 136–145)
TOTAL PROTEIN: 7.7 g/dL (ref 6.4–8.3)

## 2018-02-25 MED ORDER — LIDOCAINE-PRILOCAINE 2.5-2.5 % EX CREA: 1.0000 "application " | TOPICAL_CREAM | CUTANEOUS | 1 refills | Status: AC | PRN

## 2018-02-25 MED ORDER — ZOLEDRONIC ACID 4 MG/100ML IV SOLN
4.0000 mg | Freq: Once | INTRAVENOUS | Status: AC
  Administered 2018-02-25: 4 mg via INTRAVENOUS
  Filled 2018-02-25: qty 100

## 2018-02-25 MED ORDER — LIDOCAINE-PRILOCAINE 2.5-2.5 % EX CREA: 1.0000 "application " | TOPICAL_CREAM | CUTANEOUS | 1 refills | Status: DC | PRN

## 2018-02-25 MED ORDER — SODIUM CHLORIDE 0.9 % IV SOLN
Freq: Once | INTRAVENOUS | Status: AC
  Administered 2018-02-25: 16:00:00 via INTRAVENOUS

## 2018-02-25 MED ORDER — OXYCODONE-ACETAMINOPHEN 5-325 MG PO TABS: 1.0000 | ORAL_TABLET | Freq: Four times a day (QID) | ORAL | 0 refills | Status: DC | PRN

## 2018-02-25 NOTE — Progress Notes (Signed)
Tolerated Zometa well. Discharged home after IV d/c'd via w/c and with daughter in [place].

## 2018-02-25 NOTE — Patient Instructions (Signed)

## 2018-02-25 NOTE — Telephone Encounter (Signed)
Patient's Daughter Maria Washington left voicemail stating patient has not been feeling well for last three days and questions if it is related to Keytruda infusion received 02/17/18. RN returned call to [facility]. Maria Washington states patient is "weak, unstable, and cold to touch". Daughter reports symptoms have been worsening by the day. Denies chills, nausea, or vomiting. Also reports "no appetite" and denies feeling of a fever although patient's temperature has not been checked. Dr. Julien Nordmann made aware and requests that patient visit with Sandi Mealy in [facility] and follow-up with him after seeing patient. Maria Washington states she will bring patient in as soon as possible.

## 2018-02-27 NOTE — Progress Notes (Signed)
[facility] Progress Note   Maria Washington [id number] 03/11/1929 84 y.o.  Maria Washington is managed by Dr. Eilleen Kempf  Actively treated with chemotherapy: yes  Current Therapy: Keytruda  Last Treated:  02/17/2018  Assessment: Plan:    Weakness - Plan: CBC with Differential ([place] Only), CMP ([place] only)  Stage IV squamous cell carcinoma of right lung ([place]) - Plan: oxyCODONE-acetaminophen (PERCOCET/ROXICET) 5-325 MG tablet  Hypercalcemia - Plan: Zoledronic Acid (ZOMETA) 4 mg IVPB, 0.9 %  sodium chloride infusion  Neuropathy   Generalized weakness: A CBC and comprehensive chemistry panel were collected today.  The patient's CBC returned showing a hemoglobin of 10.9 and hematocrit of 34.  Stage IV squamous cell carcinoma of the right lung: The patient is status post cycle 1, day 1 of Keytruda which was dosed on 02/17/2018.  She is scheduled to see Dr. Earlie Server on 03/17/2018.  Hypercalcemia: The patient's chemistry panel returned showing a calcium of 11.5 with a corrected calcium returning at 12.4.  Based on this the patient was given Zometa 4 mg IV x1.  Neuropathy: The patient will restart gabapentin 100 mg p.o. twice daily.  Please see After Visit Summary for patient specific instructions.  Future Appointments  Date Time Provider [place]  03/10/2018  9:45 AM [facility] LAB 5 [facility] None  03/10/2018 10:00 AM [facility] INJ NURSE [facility] None  03/10/2018 10:30 AM Curt Bears, MD [facility] None  03/10/2018 11:30 AM [facility] C9 [facility] None  03/16/2018 11:45 AM [facility] FLUSH NURSE [facility] None  03/31/2018  9:45 AM [facility] LAB 5 [facility] None  03/31/2018 10:00 AM [facility] INJ NURSE [facility] None  03/31/2018 10:45 AM [facility] B5 [facility] None  04/21/2018  8:45 AM [facility] LAB 2 [facility] None  04/21/2018  9:00 AM [facility] FLUSH NURSE 2 [facility] None  04/21/2018  9:30 AM [facility]  F20 [facility] None    Orders Placed This Encounter  Procedures  . CBC with Differential ([place] Only)  . CMP ([place] only)       Subjective:   Patient ID:  Maria Washington is a 84 y.o. (DOB [birth year]) female.  Chief Complaint:  Chief Complaint  Patient presents with  . Fatigue    HPI Maria Washington is an 84 year old female with a stage IV (T2b, N3, M1a) non-small cell lung cancer, squamous cell carcinoma dating to March 2018.  She is managed by Dr. Eilleen Kempf and has recently completed cycle 1, day 1 of Keytruda which was dosed on 02/17/2018.  She presents to the office today with a report of weakness, unsteady gait, cold intolerance, anorexia, increased cough especially when she is sitting up.  She also reports nausea, weakness, and right-sided chest wall pain and itching itching without rash or blisters along her right lateral chest wall.  Her shortness of breath is no higher than her baseline.  She has been having some loose stools approximately 2-3/day.  She denies diarrhea but states that her stools have simply been soft.  She reports having neuropathy in her feet which is ongoing.  She is previously taken Neurontin for this and has 100 mg tablets at home.  She was contacted by "[employer]" with the company sending a doctor to her house to evaluate her.  She was started on albuterol nebulizers.  She is unsure who made the arrangements to have her evaluated at her home.  Medications: I have reviewed the patient's current medications.  Allergies:  Allergies  Allergen Reactions  .  Clindamycin/Lincomycin Rash  . Penicillins Rash and Other (See Comments)    Has patient had a PCN reaction causing immediate rash, facial/tongue/throat swelling, SOB or lightheadedness with hypotension: No Has patient had a PCN reaction causing severe rash involving mucus membranes or skin necrosis: No Has patient had a PCN reaction that required hospitalization: No Has patient had a  PCN reaction occurring within the last 10 years: No If all of the above answers are "NO", then may proceed with Cephalosporin use.    Past Medical History:  Diagnosis Date  . Aortic atherosclerosis ([place]) 02/10/2017  . Arthritis   . CAD (coronary artery disease), native coronary artery    3 vessel calcification noted on CT scan   . Cardiac pacemaker in situ 12/05/2011  . Chronic diastolic congestive heart failure ([place])   . COPD (chronic obstructive pulmonary disease) ([facility])    spot on lung  . Dehydration 06/04/2017  . Encounter for antineoplastic chemotherapy 03/04/2017  . Goals of care, counseling/discussion 03/17/2017  . History of breast cancer    Treated with lumpectomy, radiation, and chemo sees Dr. Ralene Ok   . Hypertensive heart disease without CHF   . Hypokalemia 04/18/2017  . Hypothyroid   . Mitral regurgitation   . Paroxysmal atrial fibrillation ([facility])    CHA2DS2VASC score 5  . Personal history of chemotherapy   . Personal history of radiation therapy   . Second degree heart block   . Stage IV squamous cell carcinoma of right lung ([place]) 03/04/2017    Past Surgical History:  Procedure Laterality Date  . ABDOMINAL HYSTERECTOMY    . BLADDER SUSPENSION    . BREAST LUMPECTOMY Left    lumpectomy  . CARPAL TUNNEL RELEASE     left wrist  . CHOLECYSTECTOMY    . FINGER SURGERY    . IR FLUORO GUIDE PORT INSERTION RIGHT  03/12/2017  . IR US GUIDE VASC ACCESS RIGHT  03/12/2017  . PERMANENT PACEMAKER INSERTION N/A 12/04/2011   Procedure: PERMANENT PACEMAKER INSERTION;  Surgeon: Evans Lance, MD;  Location: [facility] CATH LAB;  Service: Cardiovascular;  Laterality: N/A;  . SHOULDER SURGERY    . TONSILLECTOMY AND ADENOIDECTOMY      Family History  Problem Relation Age of Onset  . Heart attack Mother   . Cancer Brother        kidney  . Cancer Daughter        lung  . Breast cancer Neg Hx     Social History   Socioeconomic History  . Marital status: Widowed    Spouse name: Not on  file  . Number of children: 6  . Years of education: 40  . Highest education level: Not on file  Occupational History  . Not on file  Social Needs  . Financial resource strain: Not on file  . Food insecurity:    Worry: Not on file    Inability: Not on file  . Transportation needs:    Medical: Not on file    Non-medical: Not on file  Tobacco Use  . Smoking status: Former Smoker    Packs/day: 1.00    Years: 60.00    Pack years: 60.00    Types: Cigarettes    Last attempt to quit: 06/07/2013    Years since quitting: 4.7  . Smokeless tobacco: Never Used  Substance and Sexual Activity  . Alcohol use: No  . Drug use: No  . Sexual activity: Not Currently  Lifestyle  . Physical activity:  Days per week: Not on file    Minutes per session: Not on file  . Stress: Not on file  Relationships  . Social connections:    Talks on phone: Not on file    Gets together: Not on file    Attends religious service: Not on file    Active member of club or organization: Not on file    Attends meetings of clubs or organizations: Not on file    Relationship status: Not on file  . Intimate partner violence:    Fear of current or ex partner: Not on file    Emotionally abused: Not on file    Physically abused: Not on file    Forced sexual activity: Not on file  Other Topics Concern  . Not on file  Social History Narrative   Widow.  Has 22 dogs.   Right handed    Caffeine use: Coffee- 2 cups per day or less   Decaf Mtn Dew   Lives with daughter    Past Medical History, Surgical history, Social history, and Family history were reviewed and updated as appropriate.   Please see review of systems for further details on the patient's review from today.   Review of Systems:  Review of Systems  Constitutional: Positive for appetite change, chills and fatigue. Negative for diaphoresis and fever.  HENT: Negative for trouble swallowing.   Respiratory: Positive for cough and shortness of breath.  Negative for choking, chest tightness and wheezing.   Cardiovascular: Positive for chest pain. Negative for palpitations.  Gastrointestinal: Negative for constipation, diarrhea, nausea and vomiting.  Neurological: Positive for weakness. Negative for dizziness, light-headedness and headaches.    Objective:   Physical Exam:  BP 134/66 (BP Location: Left Arm, Patient Position: Sitting)   Pulse 84   Temp 98 F (36.7 C) (Oral)   Resp 19   Ht 5' (1.524 m)   Wt 145 lb 12.8 oz (66.1 kg)   SpO2 100%   BMI 28.47 kg/m  ECOG: 1  Physical Exam  Constitutional: No distress.  HENT:  Head: Normocephalic and atraumatic.  Cardiovascular: Normal rate, regular rhythm and normal heart sounds. Exam reveals no gallop and no friction rub.  No murmur heard. Pulmonary/Chest: Effort normal and breath sounds normal. No respiratory distress. She has no wheezes. She has no rales.  Abdominal: Soft. Bowel sounds are normal. She exhibits no distension. There is no tenderness. There is no rebound and no guarding.  Neurological: She is alert.  Skin: Skin is warm and dry. No rash noted. She is not diaphoretic. No erythema.    Lab Review:     Component Value Date/Time   NA 139 02/25/2018 1436   NA 141 11/03/2017 0834   K 3.8 02/25/2018 1436   K 3.9 11/03/2017 0834   CL 105 02/25/2018 1436   CO2 23 02/25/2018 1436   CO2 23 11/03/2017 0834   GLUCOSE 106 02/25/2018 1436   GLUCOSE 110 11/03/2017 0834   BUN 11 02/25/2018 1436   BUN 10.1 11/03/2017 0834   CREATININE 0.81 02/25/2018 1436   CREATININE 0.8 11/03/2017 0834   CALCIUM 11.5 (H) 02/25/2018 1436   CALCIUM 9.6 11/03/2017 0834   PROT 7.7 02/25/2018 1436   PROT 6.8 11/03/2017 0834   ALBUMIN 3.1 (L) 02/25/2018 1436   ALBUMIN 2.7 (L) 11/03/2017 0834   AST 14 02/25/2018 1436   AST 13 11/03/2017 0834   ALT 11 02/25/2018 1436   ALT 11 11/03/2017 0834   ALKPHOS 67  02/25/2018 1436   ALKPHOS 61 11/03/2017 0834   BILITOT 0.5 02/25/2018 1436   BILITOT  0.31 11/03/2017 0834   GFRNONAA >60 02/25/2018 1436   GFRAA >60 02/25/2018 1436       Component Value Date/Time   WBC 7.6 02/25/2018 1436   WBC 6.9 02/02/2018 1116   RBC 3.80 02/25/2018 1436   HGB 10.5 (L) 02/02/2018 1116   HGB 10.0 (L) 11/03/2017 0834   HCT 34.0 (L) 02/25/2018 1436   HCT 31.6 (L) 11/03/2017 0834   PLT 220 02/25/2018 1436   PLT 207 11/03/2017 0834   MCV 89.5 02/25/2018 1436   MCV 93.5 11/03/2017 0834   MCH 28.7 02/25/2018 1436   MCHC 32.1 02/25/2018 1436   RDW 17.9 (H) 02/25/2018 1436   RDW 16.5 (H) 11/03/2017 0834   LYMPHSABS 1.0 02/25/2018 1436   LYMPHSABS 1.0 11/03/2017 0834   MONOABS 1.1 (H) 02/25/2018 1436   MONOABS 0.7 11/03/2017 0834   EOSABS 0.2 02/25/2018 1436   EOSABS 0.1 11/03/2017 0834   BASOSABS 0.0 02/25/2018 1436   BASOSABS 0.0 11/03/2017 0834   -------------------------------  Imaging from last 24 hours (if applicable):  Radiology interpretation: Ct Head W Wo Contrast  Result Date: 02/13/2018 CLINICAL DATA:  Lung cancer staging EXAM: CT HEAD WITHOUT AND WITH CONTRAST TECHNIQUE: Contiguous axial images were obtained from the base of the skull through the vertex without and with intravenous contrast CONTRAST:  66mL ISOVUE-300 IOPAMIDOL (ISOVUE-300) INJECTION 61% COMPARISON:  CT head 02/24/2017 FINDINGS: Brain: Generalized atrophy. Negative for hydrocephalus. Patchy hypodensity in the white matter bilaterally unchanged. Negative for acute infarct, hemorrhage, or metastatic disease. No enhancing lesions are identified. Vascular: No acute vascular thrombosis. Normal arterial and venous enhancement. Skull: Negative Sinuses/Orbits: Negative Other: None IMPRESSION: Negative for metastatic disease Atrophy with chronic microvascular ischemia.  No acute abnormality. Electronically Signed   By: Franchot Gallo M.D.   On: 02/13/2018 14:29   Ct Chest W Contrast  Result Date: 02/02/2018 CLINICAL DATA:  Left breast cancer diagnosed in 2002 with lumpectomy,  chemotherapy and radiation therapy. Stage IV right upper lobe lung cancer diagnosed in March 2018. EXAM: CT CHEST, ABDOMEN, AND PELVIS WITH CONTRAST TECHNIQUE: Multidetector CT imaging of the chest, abdomen and pelvis was performed following the standard protocol during bolus administration of intravenous contrast. CONTRAST:  170mL ISOVUE-300 IOPAMIDOL (ISOVUE-300) INJECTION 61% COMPARISON:  CT 09/25/2017 and 07/25/2017. FINDINGS: CT CHEST FINDINGS Cardiovascular: Diffuse atherosclerosis of the aorta, great vessels and coronary arteries. No acute vascular findings are evident. Right IJ Port-A-Cath extends to the superior cavoatrial junction. Pacemaker leads appear unchanged. Stable mild cardiomegaly without pericardial effusion. Mediastinum/Nodes: Mediastinal lymphadenopathy has progressed. Right paratracheal node measures 12 mm on image 18/2 (previously 10 mm). There are enlarging nodes in the AP window measuring up to 3.5 x 1.9 cm on image 18. Prevascular node measures 12 mm on image 18. No definite hilar or axillary adenopathy status post left axillary node dissection. The thyroid gland, trachea and esophagus demonstrate no significant findings. Lungs/Pleura: There is no pleural effusion. Progressive enlargement of thick-walled cavitary right upper lobe mass, now measuring up to 7.1 x 5.6 cm on image 18/2 (previously 2.7 cm maximally). There are surrounding pulmonary opacities and bronchiectasis, extending into the superior segment of the right lower lobe. This mass demonstrates possible intercostal extension the left 4th rib space posteromedially (sagittal image 68). There is possible minimal foraminal extension of tumor as well. No definite intraspinal component. There are numerous scattered tiny pulmonary nodules bilaterally which are  stable. There is stable emphysema and subpleural scarring anteriorly in the left upper lobe, the latter to be due to remote radiation therapy for breast cancer.  Musculoskeletal/Chest wall: As above, the dominant right upper lobe mass may extend into upper chest wall posteriorly. No rib destruction or intraspinal extension seen. No other osseous abnormalities. CT ABDOMEN AND PELVIS FINDINGS Hepatobiliary: The liver is normal in density without focal abnormality. There is stable mild intra and extrahepatic biliary dilatation status post cholecystectomy. Pancreas: Unremarkable. No pancreatic ductal dilatation or surrounding inflammatory changes. Spleen: Numerous small low-density splenic lesions are similar to the recent prior studies, likely incidental. The spleen is normal in size. Adrenals/Urinary Tract: Both adrenal glands appear normal. Stable dominant cyst involving the upper interpolar region of the left kidney and additional probable tiny cysts bilaterally. No urinary tract calculus or hydronephrosis. Possible small right-sided bladder diverticulum. The bladder otherwise appears normal. Stomach/Bowel: No evidence of bowel wall thickening, distention or surrounding inflammatory change. Vascular/Lymphatic: There are no enlarged abdominal or pelvic lymph nodes. Aortic and branch vessel atherosclerosis and mild dilatation of the abdominal aorta are stable. No acute vascular findings. Reproductive: Hysterectomy.  No evidence of adnexal mass. Other: Intact anterior abdominal wall. No ascites or peritoneal nodularity. Musculoskeletal: No acute or significant osseous findings. Grossly stable degenerative changes in the spine. IMPRESSION: 1. Significant interval enlargement of dominant, cavitary right upper lobe mass. Appearance most likely represents tumor progression, although superimposed infection is a possibility. This mass demonstrates intercostal and possible mild neural foraminal extension. 2. Progressive mediastinal lymphadenopathy, likely tumor progression. 3. Stable tiny pulmonary nodules bilaterally. 4. No evidence of metastatic disease in the abdomen or pelvis. 5.   Aortic Atherosclerosis (ICD10-I70.0). Electronically Signed   By: Richardean Sale M.D.   On: 02/02/2018 14:58   Ct Abdomen Pelvis W Contrast  Result Date: 02/02/2018 CLINICAL DATA:  Left breast cancer diagnosed in 2002 with lumpectomy, chemotherapy and radiation therapy. Stage IV right upper lobe lung cancer diagnosed in March 2018. EXAM: CT CHEST, ABDOMEN, AND PELVIS WITH CONTRAST TECHNIQUE: Multidetector CT imaging of the chest, abdomen and pelvis was performed following the standard protocol during bolus administration of intravenous contrast. CONTRAST:  137mL ISOVUE-300 IOPAMIDOL (ISOVUE-300) INJECTION 61% COMPARISON:  CT 09/25/2017 and 07/25/2017. FINDINGS: CT CHEST FINDINGS Cardiovascular: Diffuse atherosclerosis of the aorta, great vessels and coronary arteries. No acute vascular findings are evident. Right IJ Port-A-Cath extends to the superior cavoatrial junction. Pacemaker leads appear unchanged. Stable mild cardiomegaly without pericardial effusion. Mediastinum/Nodes: Mediastinal lymphadenopathy has progressed. Right paratracheal node measures 12 mm on image 18/2 (previously 10 mm). There are enlarging nodes in the AP window measuring up to 3.5 x 1.9 cm on image 18. Prevascular node measures 12 mm on image 18. No definite hilar or axillary adenopathy status post left axillary node dissection. The thyroid gland, trachea and esophagus demonstrate no significant findings. Lungs/Pleura: There is no pleural effusion. Progressive enlargement of thick-walled cavitary right upper lobe mass, now measuring up to 7.1 x 5.6 cm on image 18/2 (previously 2.7 cm maximally). There are surrounding pulmonary opacities and bronchiectasis, extending into the superior segment of the right lower lobe. This mass demonstrates possible intercostal extension the left 4th rib space posteromedially (sagittal image 68). There is possible minimal foraminal extension of tumor as well. No definite intraspinal component. There are  numerous scattered tiny pulmonary nodules bilaterally which are stable. There is stable emphysema and subpleural scarring anteriorly in the left upper lobe, the latter to be due to remote radiation therapy  for breast cancer. Musculoskeletal/Chest wall: As above, the dominant right upper lobe mass may extend into upper chest wall posteriorly. No rib destruction or intraspinal extension seen. No other osseous abnormalities. CT ABDOMEN AND PELVIS FINDINGS Hepatobiliary: The liver is normal in density without focal abnormality. There is stable mild intra and extrahepatic biliary dilatation status post cholecystectomy. Pancreas: Unremarkable. No pancreatic ductal dilatation or surrounding inflammatory changes. Spleen: Numerous small low-density splenic lesions are similar to the recent prior studies, likely incidental. The spleen is normal in size. Adrenals/Urinary Tract: Both adrenal glands appear normal. Stable dominant cyst involving the upper interpolar region of the left kidney and additional probable tiny cysts bilaterally. No urinary tract calculus or hydronephrosis. Possible small right-sided bladder diverticulum. The bladder otherwise appears normal. Stomach/Bowel: No evidence of bowel wall thickening, distention or surrounding inflammatory change. Vascular/Lymphatic: There are no enlarged abdominal or pelvic lymph nodes. Aortic and branch vessel atherosclerosis and mild dilatation of the abdominal aorta are stable. No acute vascular findings. Reproductive: Hysterectomy.  No evidence of adnexal mass. Other: Intact anterior abdominal wall. No ascites or peritoneal nodularity. Musculoskeletal: No acute or significant osseous findings. Grossly stable degenerative changes in the spine. IMPRESSION: 1. Significant interval enlargement of dominant, cavitary right upper lobe mass. Appearance most likely represents tumor progression, although superimposed infection is a possibility. This mass demonstrates intercostal and  possible mild neural foraminal extension. 2. Progressive mediastinal lymphadenopathy, likely tumor progression. 3. Stable tiny pulmonary nodules bilaterally. 4. No evidence of metastatic disease in the abdomen or pelvis. 5.  Aortic Atherosclerosis (ICD10-I70.0). Electronically Signed   By: Richardean Sale M.D.   On: 02/02/2018 14:58

## 2018-03-10 ENCOUNTER — Inpatient Hospital Stay: Payer: Medicare Other

## 2018-03-10 ENCOUNTER — Encounter: Payer: Self-pay | Admitting: Internal Medicine

## 2018-03-10 ENCOUNTER — Inpatient Hospital Stay: Payer: Medicare Other | Attending: Internal Medicine | Admitting: Internal Medicine

## 2018-03-10 VITALS — BP 127/58 | HR 69 | Temp 97.8°F | Resp 18 | Ht 60.0 in | Wt 145.6 lb

## 2018-03-10 DIAGNOSIS — Z7189 Other specified counseling: Secondary | ICD-10-CM

## 2018-03-10 DIAGNOSIS — C3491 Malignant neoplasm of unspecified part of right bronchus or lung: Secondary | ICD-10-CM

## 2018-03-10 DIAGNOSIS — L309 Dermatitis, unspecified: Secondary | ICD-10-CM | POA: Insufficient documentation

## 2018-03-10 DIAGNOSIS — E039 Hypothyroidism, unspecified: Secondary | ICD-10-CM | POA: Diagnosis not present

## 2018-03-10 DIAGNOSIS — I119 Hypertensive heart disease without heart failure: Secondary | ICD-10-CM

## 2018-03-10 DIAGNOSIS — R042 Hemoptysis: Secondary | ICD-10-CM | POA: Diagnosis not present

## 2018-03-10 DIAGNOSIS — Z5112 Encounter for antineoplastic immunotherapy: Secondary | ICD-10-CM | POA: Insufficient documentation

## 2018-03-10 DIAGNOSIS — C3411 Malignant neoplasm of upper lobe, right bronchus or lung: Secondary | ICD-10-CM | POA: Insufficient documentation

## 2018-03-10 DIAGNOSIS — R5382 Chronic fatigue, unspecified: Secondary | ICD-10-CM

## 2018-03-10 DIAGNOSIS — C3490 Malignant neoplasm of unspecified part of unspecified bronchus or lung: Secondary | ICD-10-CM

## 2018-03-10 DIAGNOSIS — Z95828 Presence of other vascular implants and grafts: Secondary | ICD-10-CM

## 2018-03-10 LAB — CMP (CANCER CENTER ONLY)
ALBUMIN: 2.8 g/dL — AB (ref 3.5–5.0)
ALK PHOS: 61 U/L (ref 40–150)
ALT: 11 U/L (ref 0–55)
AST: 14 U/L (ref 5–34)
Anion gap: 8 (ref 3–11)
BILIRUBIN TOTAL: 0.5 mg/dL (ref 0.2–1.2)
BUN: 8 mg/dL (ref 7–26)
CALCIUM: 8.9 mg/dL (ref 8.4–10.4)
CO2: 22 mmol/L (ref 22–29)
CREATININE: 0.89 mg/dL (ref 0.60–1.10)
Chloride: 108 mmol/L (ref 98–109)
GFR, Est AFR Am: >60 mL/min (ref >60)
GFR, Estimated: 57 mL/min — ABNORMAL LOW (ref >60)
GLUCOSE: 207 mg/dL — AB (ref 70–140)
Potassium: 4.1 mmol/L (ref 3.5–5.1)
SODIUM: 138 mmol/L (ref 136–145)
TOTAL PROTEIN: 7 g/dL (ref 6.4–8.3)

## 2018-03-10 LAB — CBC WITH DIFFERENTIAL (CANCER CENTER ONLY)
Basophils Absolute: 0 10*3/uL (ref 0.0–0.1)
Basophils Relative: 0 %
EOS ABS: 0.1 10*3/uL (ref 0.0–0.5)
EOS PCT: 2 %
HCT: 31.7 % — ABNORMAL LOW (ref 34.8–46.6)
Hemoglobin: 10 g/dL — ABNORMAL LOW (ref 11.6–15.9)
LYMPHS PCT: 9 %
Lymphs Abs: 0.7 10*3/uL — ABNORMAL LOW (ref 0.9–3.3)
MCH: 28.1 pg (ref 25.1–34.0)
MCHC: 31.5 g/dL (ref 31.5–36.0)
MCV: 89 fL (ref 79.5–101.0)
MONO ABS: 0.8 10*3/uL (ref 0.1–0.9)
Monocytes Relative: 10 %
Neutro Abs: 6.1 10*3/uL (ref 1.5–6.5)
Neutrophils Relative %: 79 %
PLATELETS: 213 10*3/uL (ref 145–400)
RBC: 3.56 MIL/uL — ABNORMAL LOW (ref 3.70–5.45)
RDW: 18.3 % — AB (ref 11.2–14.5)
WBC Count: 7.7 10*3/uL (ref 3.9–10.3)

## 2018-03-10 LAB — TSH: TSH: 0.282 u[IU]/mL — ABNORMAL LOW (ref 0.308–3.960)

## 2018-03-10 MED ORDER — SODIUM CHLORIDE 0.9% FLUSH
10.0000 mL | Freq: Once | INTRAVENOUS | Status: AC
  Administered 2018-03-10: 10 mL
  Filled 2018-03-10: qty 10

## 2018-03-10 MED ORDER — OXYCODONE-ACETAMINOPHEN 5-325 MG PO TABS: 1.0000 | ORAL_TABLET | Freq: Four times a day (QID) | ORAL | 0 refills | Status: DC | PRN

## 2018-03-10 MED ORDER — SODIUM CHLORIDE 0.9% FLUSH
10.0000 mL | INTRAVENOUS | Status: DC | PRN
  Administered 2018-03-10: 10 mL
  Filled 2018-03-10: qty 10

## 2018-03-10 MED ORDER — SODIUM CHLORIDE 0.9 % IV SOLN
Freq: Once | INTRAVENOUS | Status: AC
  Administered 2018-03-10: 12:00:00 via INTRAVENOUS

## 2018-03-10 MED ORDER — PEMBROLIZUMAB CHEMO INJECTION 100 MG/4ML
200.0000 mg | Freq: Once | INTRAVENOUS | Status: AC
  Administered 2018-03-10: 200 mg via INTRAVENOUS
  Filled 2018-03-10: qty 8

## 2018-03-10 MED ORDER — HEPARIN SOD (PORK) LOCK FLUSH 100 UNIT/ML IV SOLN
500.0000 [IU] | Freq: Once | INTRAVENOUS | Status: AC | PRN
  Administered 2018-03-10: 500 [IU]
  Filled 2018-03-10: qty 5

## 2018-03-10 MED ORDER — PROCHLORPERAZINE MALEATE 10 MG PO TABS
10.0000 mg | ORAL_TABLET | Freq: Once | ORAL | Status: AC
  Administered 2018-03-10: 10 mg via ORAL

## 2018-03-10 MED ORDER — PROCHLORPERAZINE MALEATE 10 MG PO TABS
ORAL_TABLET | ORAL | Status: AC
  Filled 2018-03-10: qty 1

## 2018-03-10 MED ORDER — DOXYCYCLINE HYCLATE 100 MG PO TABS: 100.0000 mg | ORAL_TABLET | Freq: Two times a day (BID) | ORAL | 0 refills | Status: DC

## 2018-03-10 NOTE — Progress Notes (Signed)
[facility] [phone]   [phone]  OFFICE PROGRESS NOTE  Lajean Manes, MD [address]  [place] [address]  DIAGNOSIS: Stage IV (T2b, N3, M1 a) non-small cell lung cancer, squamous cell carcinoma presented with large right upper lobe lung mass in addition to bilateral mediastinal lymphadenopathy as well as left upper lobe lung nodule diagnosed in March 2018. PDL 1 expression is 5%.   PRIOR THERAPY: Systemic chemotherapy with carboplatin for AUC of 5 and paclitaxel 175 MG/M2 every 3 weeks. First dose 03/24/2017. Status post 5 cycles. Last cycle was given 06/18/2017. Discontinued secondary to entrance and significant peripheral neuropathy.  CURRENT THERAPY: Second line treatment with immunotherapy with Keytruda 200 mg IV every 3 weeks.  First dose February 17, 2018.  Status post 1 cycle.  INTERVAL HISTORY: JACKEE GLASNER 84 y.o. female returns to the clinic today for follow-up visit accompanied by her daughter.  The patient is feeling fine today with no specific complaints except for fatigue.  She continues to have cough with blood-tinged sputum.  She has cavitary lesion in the right lung that was progressive when seen on the last scan.  She tolerated the first cycle of her treatment with immunotherapy fairly well.  She denied having any chest pain but has shortness of breath at baseline increased with exertion.  She denied having any recent weight loss or night sweats.  She has no nausea, vomiting, diarrhea or constipation.  The patient has no fever or chills.  She is here today for evaluation before starting cycle #2.  MEDICAL HISTORY: Past Medical History:  Diagnosis Date  . Aortic atherosclerosis ([place]) 02/10/2017  . Arthritis   . CAD (coronary artery disease), native coronary artery    3 vessel calcification noted on CT scan   . Cardiac pacemaker in situ 12/05/2011  . Chronic diastolic congestive heart failure ([place])   . COPD  (chronic obstructive pulmonary disease) ([facility])    spot on lung  . Dehydration 06/04/2017  . Encounter for antineoplastic chemotherapy 03/04/2017  . Goals of care, counseling/discussion 03/17/2017  . History of breast cancer    Treated with lumpectomy, radiation, and chemo sees Dr. Ralene Ok   . Hypertensive heart disease without CHF   . Hypokalemia 04/18/2017  . Hypothyroid   . Mitral regurgitation   . Paroxysmal atrial fibrillation (H[facility])    CHA2DS2VASC score 5  . Personal history of chemotherapy   . Personal history of radiation therapy   . Second degree heart block   . Stage IV squamous cell carcinoma of right lung ([place]) 03/04/2017    ALLERGIES:  is allergic to clindamycin/lincomycin and penicillins.  MEDICATIONS:  Current Outpatient Medications  Medication Sig Dispense Refill  . alendronate (FOSAMAX) 70 MG tablet Take 70 mg by mouth every Monday.     . calcium-vitamin D (OSCAL WITH D) 500-200 MG-UNIT per tablet Take 1 tablet by mouth 2 (two) times daily.      . cholecalciferol (VITAMIN D) 1000 units tablet Take 1,000 Units by mouth daily.     Marland Kitchen diltiazem (CARDIZEM CD) 120 MG 24 hr capsule Take 120 mg by mouth daily.  3  . ELIQUIS 2.5 MG TABS tablet Take 2.5 mg by mouth 2 (two) times daily.  3  . folic acid (FOLVITE) 1 MG tablet Take 1 mg by mouth daily.      . furosemide (LASIX) 40 MG tablet Take 40 mg by mouth daily.     Marland Kitchen  gabapentin (NEURONTIN) 300 MG capsule Take 300 mg by mouth 3 (three) times daily. 300mg  in am and 200 mg pm    . levothyroxine (SYNTHROID, LEVOTHROID) 137 MCG tablet Take 137 mcg by mouth daily before breakfast.    . lidocaine-prilocaine (EMLA) cream Apply 1 application topically as needed. 30 g 1  . Omega-3 Fatty Acids (FISH OIL) 1200 MG CAPS Take 1,200 mg by mouth 2 (two) times daily.     Marland Kitchen oxyCODONE-acetaminophen (PERCOCET/ROXICET) 5-325 MG tablet Take 1 tablet by mouth every 6 (six) hours as needed for severe pain. 30 tablet 0  . potassium chloride SA  (K-DUR,KLOR-CON) 20 MEQ tablet Take 40 mEq by mouth 2 (two) times daily.     . vitamin B-12 (CYANOCOBALAMIN) 1000 MCG tablet Take 1,000 mcg by mouth daily.      Marland Kitchen albuterol (ACCUNEB) 0.63 MG/3ML nebulizer solution Take 1 ampule by nebulization every 6 (six) hours as needed for wheezing.    . diphenoxylate-atropine (LOMOTIL) 2.5-0.025 MG tablet Take 1 tablet by mouth 4 (four) times daily as needed for diarrhea or loose stools. May take qid if imodium is not working. (Patient not taking: Reported on 02/09/2018) 30 tablet 0  . loperamide (IMODIUM) 2 MG capsule Take 2-4 mg by mouth 4 (four) times daily as needed for diarrhea or loose stools.     . prochlorperazine (COMPAZINE) 10 MG tablet Take 1 tablet (10 mg total) by mouth every 6 (six) hours as needed for nausea or vomiting. (Patient not taking: Reported on 02/09/2018) 30 tablet 0   No current facility-administered medications for this visit.     SURGICAL HISTORY:  Past Surgical History:  Procedure Laterality Date  . ABDOMINAL HYSTERECTOMY    . BLADDER SUSPENSION    . BREAST LUMPECTOMY Left    lumpectomy  . CARPAL TUNNEL RELEASE     left wrist  . CHOLECYSTECTOMY    . FINGER SURGERY    . IR FLUORO GUIDE PORT INSERTION RIGHT  03/12/2017  . IR US GUIDE VASC ACCESS RIGHT  03/12/2017  . PERMANENT PACEMAKER INSERTION N/A 12/04/2011   Procedure: PERMANENT PACEMAKER INSERTION;  Surgeon: Evans Lance, MD;  Location: [facility] CATH LAB;  Service: Cardiovascular;  Laterality: N/A;  . SHOULDER SURGERY    . TONSILLECTOMY AND ADENOIDECTOMY      REVIEW OF SYSTEMS:  Constitutional: positive for fatigue Eyes: negative Ears, nose, mouth, throat, and face: negative Respiratory: positive for cough, dyspnea on exertion and hemoptysis Cardiovascular: negative Gastrointestinal: negative Genitourinary:negative Integument/breast: negative Hematologic/lymphatic: negative Musculoskeletal:negative Neurological: negative Behavioral/Psych: negative Endocrine:  negative Allergic/Immunologic: negative   PHYSICAL EXAMINATION: General appearance: alert, cooperative, fatigued and no distress Head: Normocephalic, without obvious abnormality, atraumatic Neck: no adenopathy, no JVD, supple, symmetrical, trachea midline and thyroid not enlarged, symmetric, no tenderness/mass/nodules Lymph nodes: Cervical, supraclavicular, and axillary nodes normal. Resp: diminished breath sounds RLL and dullness to percussion RLL Back: symmetric, no curvature. ROM normal. No CVA tenderness. Cardio: regular rate and rhythm, S1, S2 normal, no murmur, click, rub or gallop GI: soft, non-tender; bowel sounds normal; no masses,  no organomegaly Extremities: extremities normal, atraumatic, no cyanosis or edema Neurologic: Alert and oriented X 3, normal strength and tone. Normal symmetric reflexes. Normal coordination and gait  ECOG PERFORMANCE STATUS: 1 - Symptomatic but completely ambulatory  Blood pressure (!) 127/58, pulse 69, temperature 97.8 F (36.6 C), temperature source Oral, resp. rate 18, height 5' (1.524 m), weight 145 lb 9.6 oz (66 kg), SpO2 98 %.  LABORATORY DATA: Lab Results  Component Value Date   WBC 7.7 03/10/2018   HGB 10.5 (L) 02/02/2018   HCT 31.7 (L) 03/10/2018   MCV 89.0 03/10/2018   PLT 213 03/10/2018      Chemistry      Component Value Date/Time   NA 138 03/10/2018 0905   NA 141 11/03/2017 0834   K 4.1 03/10/2018 0905   K 3.9 11/03/2017 0834   CL 108 03/10/2018 0905   CO2 22 03/10/2018 0905   CO2 23 11/03/2017 0834   BUN 8 03/10/2018 0905   BUN 10.1 11/03/2017 0834   CREATININE 0.89 03/10/2018 0905   CREATININE 0.8 11/03/2017 0834      Component Value Date/Time   CALCIUM 8.9 03/10/2018 0905   CALCIUM 9.6 11/03/2017 0834   ALKPHOS 61 03/10/2018 0905   ALKPHOS 61 11/03/2017 0834   AST 14 03/10/2018 0905   AST 13 11/03/2017 0834   ALT 11 03/10/2018 0905   ALT 11 11/03/2017 0834   BILITOT 0.5 03/10/2018 0905   BILITOT 0.31  11/03/2017 0834       RADIOGRAPHIC STUDIES: Ct Head W Wo Contrast  Result Date: 02/13/2018 CLINICAL DATA:  Lung cancer staging EXAM: CT HEAD WITHOUT AND WITH CONTRAST TECHNIQUE: Contiguous axial images were obtained from the base of the skull through the vertex without and with intravenous contrast CONTRAST:  42mL ISOVUE-300 IOPAMIDOL (ISOVUE-300) INJECTION 61% COMPARISON:  CT head 02/24/2017 FINDINGS: Brain: Generalized atrophy. Negative for hydrocephalus. Patchy hypodensity in the white matter bilaterally unchanged. Negative for acute infarct, hemorrhage, or metastatic disease. No enhancing lesions are identified. Vascular: No acute vascular thrombosis. Normal arterial and venous enhancement. Skull: Negative Sinuses/Orbits: Negative Other: None IMPRESSION: Negative for metastatic disease Atrophy with chronic microvascular ischemia.  No acute abnormality. Electronically Signed   By: Franchot Gallo M.D.   On: 02/13/2018 14:29    ASSESSMENT AND PLAN:  This is a very pleasant 84 years old white female with a stage IV non-small cell lung cancer, adenocarcinoma with PDL 1 expression of 5%. The patient underwent systemic chemotherapy with carboplatin and paclitaxel, status post 5 cycles and has been tolerating her treatment well except for increasing fatigue and weakness as well as development of peripheral neuropathy. Last dose of chemotherapy was in July 2018.   The patient has been in observation since that time. Repeat CT scan of the chest, abdomen and pelvis performed recently showed evidence for disease progression with enlargement of the dominant cavitary right upper lobe lung mass as well as mediastinal lymphadenopathy. The patient was a started on second line treatment with immunotherapy with Keytruda status post 1 cycle.  She tolerated the first cycle of her treatment well. I recommended for her to proceed with cycle #2 today as a scheduled. For the cough productive of blood-tinged sputum, I  will start the patient on a course of antibiotics with doxycycline 100 mg p.o. twice daily.  If no improvement in her condition and she continues to have hemoptysis, I would refer the patient to Dr. Tammi Klippel to see if she would be candidate for additional radiotherapy to that area.  I also discussed with the patient consideration of referral to palliative care if needed.  She would like to continue on treatment for now. For pain management, I gave her a refill of Percocet. The patient will come back for follow-up visit in 3 weeks for evaluation before the next cycle of her treatment. She was advised to call immediately if she has any concerning symptoms in the interval. The  patient voices understanding of current disease status and treatment options and is in agreement with the current care plan. All questions were answered. The patient knows to call the clinic with any problems, questions or concerns. We can certainly see the patient much sooner if necessary.  Disclaimer: This note was dictated with voice recognition software. Similar sounding words can inadvertently be transcribed and may not be corrected upon review.

## 2018-03-10 NOTE — Patient Instructions (Signed)
[facility] Discharge Instructions for Patients Receiving Chemotherapy  Today you received the following chemotherapy agents :  Keytruda.  To help prevent nausea and vomiting after your treatment, we encourage you to take your nausea medication as prescribed.   If you develop nausea and vomiting that is not controlled by your nausea medication, call the clinic.   BELOW ARE SYMPTOMS THAT SHOULD BE REPORTED IMMEDIATELY:  *FEVER GREATER THAN 100.5 F  *CHILLS WITH OR WITHOUT FEVER  NAUSEA AND VOMITING THAT IS NOT CONTROLLED WITH YOUR NAUSEA MEDICATION  *UNUSUAL SHORTNESS OF BREATH  *UNUSUAL BRUISING OR BLEEDING  TENDERNESS IN MOUTH AND THROAT WITH OR WITHOUT PRESENCE OF ULCERS  *URINARY PROBLEMS  *BOWEL PROBLEMS  UNUSUAL RASH Items with * indicate a potential emergency and should be followed up as soon as possible.  Feel free to call the clinic should you have any questions or concerns. The clinic phone number is [phone].  Please show the CHEMO ALERT CARD at check-in to the Emergency Department and triage nurse.  

## 2018-03-23 ENCOUNTER — Telehealth: Payer: Self-pay | Admitting: Medical Oncology

## 2018-03-23 ENCOUNTER — Other Ambulatory Visit: Payer: Self-pay | Admitting: Medical Oncology

## 2018-03-23 DIAGNOSIS — C3491 Malignant neoplasm of unspecified part of right bronchus or lung: Secondary | ICD-10-CM

## 2018-03-23 DIAGNOSIS — G629 Polyneuropathy, unspecified: Secondary | ICD-10-CM

## 2018-03-23 MED ORDER — GABAPENTIN 300 MG PO CAPS: 300.0000 mg | ORAL_CAPSULE | ORAL | 0 refills | Status: DC

## 2018-03-23 MED ORDER — OXYCODONE-ACETAMINOPHEN 5-325 MG PO TABS: 1.0000 | ORAL_TABLET | Freq: Four times a day (QID) | ORAL | 0 refills | Status: DC | PRN

## 2018-03-23 NOTE — Telephone Encounter (Signed)
Pt called with update. She finished antibiotic Friday . Hemoptysis has stopped. Clear sputum. " I am real thrilled with this".  Requests oxycodone and gabapentin refill .  Neurontin too expensive requests generic refill gabapentin. Requests  600 mg ( 2 )  in am ,  300 mg  in afternoon and  600 mg 2 at night.

## 2018-03-23 NOTE — Addendum Note (Signed)
Addended by: Ardeen Garland on: 03/23/2018 04:40 PM   Modules accepted: Orders

## 2018-03-25 ENCOUNTER — Inpatient Hospital Stay (HOSPITAL_BASED_OUTPATIENT_CLINIC_OR_DEPARTMENT_OTHER): Payer: Medicare Other | Admitting: Medical

## 2018-03-25 VITALS — BP 115/64 | HR 86 | Temp 98.0°F | Resp 18 | Ht 60.0 in | Wt 148.7 lb

## 2018-03-25 DIAGNOSIS — L309 Dermatitis, unspecified: Secondary | ICD-10-CM

## 2018-03-25 DIAGNOSIS — C3411 Malignant neoplasm of upper lobe, right bronchus or lung: Secondary | ICD-10-CM

## 2018-03-25 MED ORDER — TRIAMCINOLONE ACETONIDE 0.1 % EX LOTN: 1.0000 "application " | TOPICAL_LOTION | Freq: Three times a day (TID) | CUTANEOUS | 0 refills | Status: DC

## 2018-03-25 NOTE — Patient Instructions (Signed)

## 2018-03-27 NOTE — Progress Notes (Signed)
[facility] Progress Note   MEI SUITS [id number] 1929-04-30 84 y.o.  [name] is managed by Dr. Fanny Bien. Mohamed  Actively treated with chemotherapy: yes  Current Therapy: Keytruda  Last Treated:  03/10/2018 (cycle 2, day 1)  Assessment: Plan:    Eczema, unspecified type - Plan: triamcinolone lotion (KENALOG) 0.1 %   Eczema and dry skin of the bilateral feet and ankles: Patient was given a prescription for triamcinolone lotion 0.1% with instructions to apply to the affected area 3 times daily as needed.  Please see After Visit Summary for patient specific instructions.  Future Appointments  Date Time Provider Lindsay  03/31/2018  9:45 AM [facility] LAB 5 [facility] None  03/31/2018 10:00 AM [facility] INJ NURSE [facility] None  03/31/2018 10:30 AM Alla Feeling, NP [facility] None  03/31/2018 11:00 AM [facility] B6 [facility] None  04/21/2018  8:45 AM [facility] LAB 2 [facility] None  04/21/2018  9:00 AM [facility] FLUSH NURSE 2 [facility] None  04/21/2018  9:15 AM Curt Bears, MD [facility] None  04/21/2018  9:30 AM [facility] F20 [facility] None  05/12/2018  9:45 AM [facility] LAB 2 [facility] None  05/12/2018 10:00 AM [facility] J32 DNS [facility] None  05/12/2018 10:30 AM Curt Bears, MD [facility] None  05/12/2018 11:45 AM [facility] H31 [facility] None  06/02/2018  9:45 AM [facility] LAB 1 [facility] None  06/02/2018 10:00 AM [facility] FLUSH NURSE 2 [facility] None  06/02/2018 10:30 AM Curt Bears, MD [facility] None  06/02/2018 11:30 AM [facility] H28 [facility] None    No orders of the defined types were placed in this encounter.      Subjective:   Patient ID:  Maria Washington is a 84 y.o. (DOB [birth year]) female.  Chief Complaint:  Chief Complaint  Patient presents with  . Rash    HPI TASHEMA Washington is an 84 year old female with a history of a stage IV (T2b, N3, M1a) non-small cell lung  cancer, squamous cell carcinoma who originally presented with a large right upper lobe mass in addition to bilateral mediastinal lymphadenopathy and a left upper lobe lung nodule.  She was originally diagnosed in March 2018.  She is currently treated with [facility] and was most recently treated with cycle 2, day 1 of therapy on 03/10/2018.  She developed a diffuse erythematous rash over her ankles last Thursday.  She is concerned that this may be related to the [facility].  She also has extremely dry skin on her feet bilaterally.  She denies any other rashes, shortness of breath, chest tightness, or difficulty swallowing.  Medications: I have reviewed the patient's current medications.  Allergies:  Allergies  Allergen Reactions  . Clindamycin/Lincomycin Rash  . Penicillins Rash and Other (See Comments)    Has patient had a PCN reaction causing immediate rash, facial/tongue/throat swelling, SOB or lightheadedness with hypotension: No Has patient had a PCN reaction causing severe rash involving mucus membranes or skin necrosis: No Has patient had a PCN reaction that required hospitalization: No Has patient had a PCN reaction occurring within the last 10 years: No If all of the above answers are "NO", then may proceed with Cephalosporin use.    Past Medical History:  Diagnosis Date  . Aortic atherosclerosis ([place]) 02/10/2017  . Arthritis   . CAD (coronary artery disease), native coronary artery    3 vessel calcification noted on CT scan   . Cardiac pacemaker in situ 12/05/2011  . Chronic diastolic congestive heart failure ([place])   .  COPD (chronic obstructive pulmonary disease) ([facility])    spot on lung  . Dehydration 06/04/2017  . Encounter for antineoplastic chemotherapy 03/04/2017  . Goals of care, counseling/discussion 03/17/2017  . History of breast cancer    Treated with lumpectomy, radiation, and chemo sees Dr. Ralene Ok   . Hypertensive heart disease without CHF   . Hypokalemia 04/18/2017  .  Hypothyroid   . Mitral regurgitation   . Paroxysmal atrial fibrillation ([facility])    CHA2DS2VASC score 5  . Personal history of chemotherapy   . Personal history of radiation therapy   . Second degree heart block   . Stage IV squamous cell carcinoma of right lung ([place]) 03/04/2017    Past Surgical History:  Procedure Laterality Date  . ABDOMINAL HYSTERECTOMY    . BLADDER SUSPENSION    . BREAST LUMPECTOMY Left    lumpectomy  . CARPAL TUNNEL RELEASE     left wrist  . CHOLECYSTECTOMY    . FINGER SURGERY    . IR FLUORO GUIDE PORT INSERTION RIGHT  03/12/2017  . IR US GUIDE VASC ACCESS RIGHT  03/12/2017  . PERMANENT PACEMAKER INSERTION N/A 12/04/2011   Procedure: PERMANENT PACEMAKER INSERTION;  Surgeon: Evans Lance, MD;  Location: [facility] CATH LAB;  Service: Cardiovascular;  Laterality: N/A;  . SHOULDER SURGERY    . TONSILLECTOMY AND ADENOIDECTOMY      Family History  Problem Relation Age of Onset  . Heart attack Mother   . Cancer Brother        kidney  . Cancer Daughter        lung  . Breast cancer Neg Hx     Social History   Socioeconomic History  . Marital status: Widowed    Spouse name: Not on file  . Number of children: 6  . Years of education: 63  . Highest education level: Not on file  Occupational History  . Not on file  Social Needs  . Financial resource strain: Not on file  . Food insecurity:    Worry: Not on file    Inability: Not on file  . Transportation needs:    Medical: Not on file    Non-medical: Not on file  Tobacco Use  . Smoking status: Former Smoker    Packs/day: 1.00    Years: 60.00    Pack years: 60.00    Types: Cigarettes    Last attempt to quit: 06/07/2013    Years since quitting: 4.8  . Smokeless tobacco: Never Used  Substance and Sexual Activity  . Alcohol use: No  . Drug use: No  . Sexual activity: Not Currently  Lifestyle  . Physical activity:    Days per week: Not on file    Minutes per session: Not on file  . Stress: Not on file    Relationships  . Social connections:    Talks on phone: Not on file    Gets together: Not on file    Attends religious service: Not on file    Active member of club or organization: Not on file    Attends meetings of clubs or organizations: Not on file    Relationship status: Not on file  . Intimate partner violence:    Fear of current or ex partner: Not on file    Emotionally abused: Not on file    Physically abused: Not on file    Forced sexual activity: Not on file  Other Topics Concern  . Not on file  Social History  Narrative   Widow.  Has 22 dogs.   Right handed    Caffeine use: Coffee- 2 cups per day or less   Decaf Mtn Dew   Lives with daughter    Past Medical History, Surgical history, Social history, and Family history were reviewed and updated as appropriate.   Please see review of systems for further details on the patient's review from today.   Review of Systems:  Review of Systems  Constitutional: Negative for chills, diaphoresis and fever.  HENT: Negative for trouble swallowing.   Respiratory: Negative for cough, choking, chest tightness and shortness of breath.   Cardiovascular: Negative for chest pain and palpitations.  Skin: Positive for rash.  Neurological: Negative for dizziness.    Objective:   Physical Exam:  BP 115/64 (BP Location: Left Arm, Patient Position: Sitting)   Pulse 86   Temp 98 F (36.7 C) (Oral)   Resp 18   Ht 5' (1.524 m)   Wt 148 lb 11.2 oz (67.4 kg)   SpO2 96%   BMI 29.04 kg/m  ECOG: 1  Physical Exam  Constitutional: No distress.  HENT:  Head: Normocephalic and atraumatic.  Cardiovascular: Normal rate, regular rhythm and normal heart sounds. Exam reveals no gallop and no friction rub.  No murmur heard. Pulmonary/Chest: Effort normal and breath sounds normal. No respiratory distress. She has no wheezes. She has no rales.  Neurological: She is alert.  Skin: Skin is warm and dry. Rash noted. She is not diaphoretic. There  is erythema.  Patient is a diffuse erythematous macular rash over her ankles bilaterally and over her proximal dorsal feet.  Also noted was that her skin is extremely dry with extensive flaking.        Lab Review:     Component Value Date/Time   NA 138 03/10/2018 0905   NA 141 11/03/2017 0834   K 4.1 03/10/2018 0905   K 3.9 11/03/2017 0834   CL 108 03/10/2018 0905   CO2 22 03/10/2018 0905   CO2 23 11/03/2017 0834   GLUCOSE 207 (H) 03/10/2018 0905   GLUCOSE 110 11/03/2017 0834   BUN 8 03/10/2018 0905   BUN 10.1 11/03/2017 0834   CREATININE 0.89 03/10/2018 0905   CREATININE 0.8 11/03/2017 0834   CALCIUM 8.9 03/10/2018 0905   CALCIUM 9.6 11/03/2017 0834   PROT 7.0 03/10/2018 0905   PROT 6.8 11/03/2017 0834   ALBUMIN 2.8 (L) 03/10/2018 0905   ALBUMIN 2.7 (L) 11/03/2017 0834   AST 14 03/10/2018 0905   AST 13 11/03/2017 0834   ALT 11 03/10/2018 0905   ALT 11 11/03/2017 0834   ALKPHOS 61 03/10/2018 0905   ALKPHOS 61 11/03/2017 0834   BILITOT 0.5 03/10/2018 0905   BILITOT 0.31 11/03/2017 0834   GFRNONAA 57 (L) 03/10/2018 0905   GFRAA >60 03/10/2018 0905       Component Value Date/Time   WBC 7.7 03/10/2018 0905   WBC 6.9 02/02/2018 1116   RBC 3.56 (L) 03/10/2018 0905   HGB 10.5 (L) 02/02/2018 1116   HGB 10.0 (L) 11/03/2017 0834   HCT 31.7 (L) 03/10/2018 0905   HCT 31.6 (L) 11/03/2017 0834   PLT 213 03/10/2018 0905   PLT 207 11/03/2017 0834   MCV 89.0 03/10/2018 0905   MCV 93.5 11/03/2017 0834   MCH 28.1 03/10/2018 0905   MCHC 31.5 03/10/2018 0905   RDW 18.3 (H) 03/10/2018 0905   RDW 16.5 (H) 11/03/2017 0834   LYMPHSABS 0.7 (L) 03/10/2018 4315  LYMPHSABS 1.0 11/03/2017 0834   MONOABS 0.8 03/10/2018 0905   MONOABS 0.7 11/03/2017 0834   EOSABS 0.1 03/10/2018 0905   EOSABS 0.1 11/03/2017 0834   BASOSABS 0.0 03/10/2018 0905   BASOSABS 0.0 11/03/2017 0834   -------------------------------  Imaging from last 24 hours (if applicable):  Radiology  interpretation: No results found.      This case was discussed with Dr. Julien Nordmann. He expressed agreement with my management of this patient.

## 2018-03-29 NOTE — Progress Notes (Addendum)
[place]  [phone] [phone]  Clinic Follow up Note   Patient Care Team: Lajean Manes, MD as PCP - General (Internal Medicine) 03/31/2018  DIAGNOSIS: Stage IV (T2b, N3, M1 a) non-small cell lung cancer, squamous cell carcinoma presented with large right upper lobe lung mass in addition to bilateral mediastinal lymphadenopathy as well as left upper lobe lung nodule diagnosed in March 2018. PDL 1 expression is 5%.   PRIOR THERAPY:  1. Systemic chemotherapy with carboplatin for AUC of 5 and paclitaxel 175 MG/M2 every 3 weeks. First dose 03/24/2017. Status post 5 cycles. Last cycle was given 06/18/2017. Discontinued secondary to fatigue, weakness, and significant peripheral neuropathy. 2. Palliative radiation therapy to RUL 2.5 Gy x14 fractions from 08/18/17 - 09/04/17 per Dr. Tammi Klippel   CURRENT THERAPY: Second line treatment with immunotherapy with Keytruda 200 mg IV every 3 weeks.  First dose February 17, 2018.  Status post 2 cycle.  INTERVAL HISTORY: Maria Washington returns for follow up as scheduled prior to cycle 3 Keytruda. Continues to report low energy but remains active, leaving the house for errands and enjoyment. She presented to symptom management clinic last week for eczema rash on her legs and feet, much improved now with kenalog. Denies other rash. She completed doxycycline and hemoptysis improved for about 1 week but returned lately, occurs with "half a teaspoon" of bright red blood each time she coughs. Cough is worse at night. Her usual pain which she describes in her right breast and occasionally in her back is well controlled with current regimen. Baseline dyspnea on exertion is unchanged. No fever, chills, night sweats, loss of appetite, or n/v/c/d.   REVIEW OF SYSTEMS:   Constitutional: Denies fevers, chills or abnormal weight loss (+) fatigue, stable   Eyes: Denies blurriness of vision Ears, nose, mouth, throat, and face: Denies mucositis  or sore throat Respiratory: Denies wheezes (+) DOE at baseline (+) hemoptysis, improved x1 week after doxy but returned, small amount bright red blood with each cough  Cardiovascular: Denies palpitation, chest discomfort or lower extremity swelling Gastrointestinal:  Denies nausea, vomiting, constipation, diarrhea, heartburn or change in bowel habits Skin: Denies abnormal skin rashes (+) rash to feet, kenalog  Lymphatics: Denies new lymphadenopathy or easy bruising Neurological:Denies numbness, tingling or new weaknesses Behavioral/Psych: Mood is stable, no new changes  All other systems were reviewed with the patient and are negative.  MEDICAL HISTORY:  Past Medical History:  Diagnosis Date  . Aortic atherosclerosis ([place]) 02/10/2017  . Arthritis   . CAD (coronary artery disease), native coronary artery    3 vessel calcification noted on CT scan   . Cardiac pacemaker in situ 12/05/2011  . Chronic diastolic congestive heart failure ([place])   . COPD (chronic obstructive pulmonary disease) (HCC)    spot on lung  . Dehydration 06/04/2017  . Encounter for antineoplastic chemotherapy 03/04/2017  . Goals of care, counseling/discussion 03/17/2017  . History of breast cancer    Treated with lumpectomy, radiation, and chemo sees Dr. Ralene Ok   . Hypertensive heart disease without CHF   . Hypokalemia 04/18/2017  . Hypothyroid   . Mitral regurgitation   . Paroxysmal atrial fibrillation (HCC)    CHA2DS2VASC score 5  . Personal history of chemotherapy   . Personal history of radiation therapy   . Second degree heart block   . Stage IV squamous cell carcinoma of right lung ([place]) 03/04/2017    SURGICAL HISTORY: Past Surgical History:  Procedure Laterality Date  .  ABDOMINAL HYSTERECTOMY    . BLADDER SUSPENSION    . BREAST LUMPECTOMY Left    lumpectomy  . CARPAL TUNNEL RELEASE     left wrist  . CHOLECYSTECTOMY    . FINGER SURGERY    . IR FLUORO GUIDE PORT INSERTION RIGHT  03/12/2017  . IR US  GUIDE VASC ACCESS RIGHT  03/12/2017  . PERMANENT PACEMAKER INSERTION N/A 12/04/2011   Procedure: PERMANENT PACEMAKER INSERTION;  Surgeon: Evans Lance, MD;  Location: [facility] CATH LAB;  Service: Cardiovascular;  Laterality: N/A;  . SHOULDER SURGERY    . TONSILLECTOMY AND ADENOIDECTOMY      I have reviewed the social history and family history with the patient and they are unchanged from previous note.  ALLERGIES:  is allergic to clindamycin/lincomycin and penicillins.  MEDICATIONS:  Current Outpatient Medications  Medication Sig Dispense Refill  . alendronate (FOSAMAX) 70 MG tablet Take 70 mg by mouth every Monday.     . calcium-vitamin D (OSCAL WITH D) 500-200 MG-UNIT per tablet Take 1 tablet by mouth 2 (two) times daily.      . cholecalciferol (VITAMIN D) 1000 units tablet Take 1,000 Units by mouth daily.     Marland Kitchen diltiazem (CARDIZEM CD) 120 MG 24 hr capsule Take 120 mg by mouth daily.  3  . diphenoxylate-atropine (LOMOTIL) 2.5-0.025 MG tablet Take 1 tablet by mouth 4 (four) times daily as needed for diarrhea or loose stools. May take qid if imodium is not working. 30 tablet 0  . ELIQUIS 2.5 MG TABS tablet Take 2.5 mg by mouth 2 (two) times daily.  3  . folic acid (FOLVITE) 1 MG tablet Take 1 mg by mouth daily.      . furosemide (LASIX) 40 MG tablet Take 40 mg by mouth daily.     Marland Kitchen gabapentin (NEURONTIN) 300 MG capsule Take 1 capsule (300 mg total) by mouth as directed. Take 2 capsules in am ( total 600 mg) , one capsule (300 mg)  in the afternoon and 2 capsules (600 mg) hs 450 capsule 0  . levothyroxine (SYNTHROID, LEVOTHROID) 137 MCG tablet Take 137 mcg by mouth daily before breakfast.    . lidocaine-prilocaine (EMLA) cream Apply 1 application topically as needed. 30 g 1  . Omega-3 Fatty Acids (FISH OIL) 1200 MG CAPS Take 1,200 mg by mouth 2 (two) times daily.     Marland Kitchen oxyCODONE-acetaminophen (PERCOCET/ROXICET) 5-325 MG tablet Take 1 tablet by mouth every 6 (six) hours as needed for severe pain.  30 tablet 0  . potassium chloride SA (K-DUR,KLOR-CON) 20 MEQ tablet Take 40 mEq by mouth 2 (two) times daily.     . vitamin B-12 (CYANOCOBALAMIN) 1000 MCG tablet Take 1,000 mcg by mouth daily.       No current facility-administered medications for this visit.     PHYSICAL EXAMINATION: ECOG PERFORMANCE STATUS: 1 - Symptomatic but completely ambulatory  Vitals:   03/31/18 1102  BP: (!) 133/53  Pulse: 71  Resp: 18  Temp: 98.3 F (36.8 C)  SpO2: 97%   Filed Weights   03/31/18 1102  Weight: 148 lb 6.4 oz (67.3 kg)    GENERAL:alert, no distress and comfortable SKIN: skin color, texture, are normal, no rashes or significant lesions (+) generalized dry flaky skin with improving mildly erythematous eczematous rash to feet EYES: normal, Conjunctiva are pink and non-injected, sclera clear OROPHARYNX:no exudate, no erythema and lips, buccal mucosa, and tongue normal  LYMPH:  no palpable cervical or supraclavicular lymphadenopathy  LUNGS: clear  to auscultation with normal breathing effort HEART: regular rate & rhythm and no murmurs and no lower extremity edema ABDOMEN:abdomen soft, non-tender and normal bowel sounds Musculoskeletal:no cyanosis of digits and no clubbing  NEURO: alert & oriented x 3 with fluent speech PAC without erythema   LABORATORY DATA:  I have reviewed the data as listed CBC Latest Ref Rng & Units 03/31/2018 03/10/2018 02/25/2018  WBC 3.9 - 10.3 K/uL 7.2 7.7 7.6  Hemoglobin 11.6 - 15.9 g/dL 10.2(L) 10.0(L) 10.9(L)  Hematocrit 34.8 - 46.6 % 31.3(L) 31.7(L) 34.0(L)  Platelets 145 - 400 K/uL 236 213 220     CMP Latest Ref Rng & Units 03/31/2018 03/10/2018 02/25/2018  Glucose 70 - 140 mg/dL 210(H) 207(H) 106  BUN 7 - 26 mg/dL 11 8 11   Creatinine 0.60 - 1.10 mg/dL 0.90 0.89 0.81  Sodium 136 - 145 mmol/L 139 138 139  Potassium 3.5 - 5.1 mmol/L 3.6 4.1 3.8  Chloride 98 - 109 mmol/L 106 108 105  CO2 22 - 29 mmol/L 21(L) 22 23  Calcium 8.4 - 10.4 mg/dL 9.9 8.9 11.5(H)    Total Protein 6.4 - 8.3 g/dL 6.8 7.0 7.7  Total Bilirubin 0.2 - 1.2 mg/dL 0.2 0.5 0.5  Alkaline Phos 40 - 150 U/L 70 61 67  AST 5 - 34 U/L 14 14 14   ALT 0 - 55 U/L 12 11 11       RADIOGRAPHIC STUDIES: I have personally reviewed the radiological images as listed and agreed with the findings in the report. No results found.   ASSESSMENT & PLAN:  Stage IV squamous cell carcinoma of right lung ([place]) Ms. Schleicher was previously treated with systemic chemotherapy taxol/carboplatin x5 cycles 03/2017 - 06/2017 and palliative RT to RUL x14 fractions per Dr. Tammi Klippel in 08/2017. She appears stable. She completed cycle 2 Keytruda on 03/10/18. She was treated for eczematous rash to her feet with topical steroids, it is improved today. Hemoptysis initially resolved with doxycycline but has worsened lately. This was a shared visit with Dr. Julien Nordmann who recommends referral back to Dr. Tammi Klippel for consideration of palliative RT for hemoptysis. I referred her today. Discussed if hemoptysis worsens she should report to nearest emergency department. She understands. Otherwise she is tolerating Keytruda well. VS and weight stable. Labs reviewed; adequate to proceed with cycle 3 today. Will obtain restaging CT CAP after this cycle. F/u in 3 weeks with imaging results and cycle 4.    PLAN: -Labs reviewed, proceed with cycle 3 Keytruda today, continue q3 weeks -Restaging CT before cycle 4, ordered today -Refer to Dr. Tammi Klippel for consideration of palliative RT for hemoptysis  -f/u with Dr. Julien Nordmann in 3 weeks for scan results and next cycle   Orders Placed This Encounter  Procedures  . CT Abdomen Pelvis W Contrast    Standing Status:   Future    Standing Expiration Date:   03/31/2019    Order Specific Question:   If indicated for the ordered procedure, I authorize the administration of contrast media per Radiology protocol    Answer:   Yes    Order Specific Question:   Preferred imaging location?    Answer:   [facility]    Order Specific Question:   Is Oral Contrast requested for this exam?    Answer:   Per Radiology protocol    Order Specific Question:   Radiology Contrast Protocol - do NOT remove file path    Answer:   \\[email or web address]  . CT Chest  W Contrast    Standing Status:   Future    Standing Expiration Date:   03/31/2019    Order Specific Question:   If indicated for the ordered procedure, I authorize the administration of contrast media per Radiology protocol    Answer:   Yes    Order Specific Question:   Preferred imaging location?    Answer:   [facility]    Order Specific Question:   Radiology Contrast Protocol - do NOT remove file path    Answer:   \\[email or web address]  . CBC with Differential ([place] Only)    Standing Status:   Future    Standing Expiration Date:   04/01/2019  . CMP ([place] only)    Standing Status:   Future    Standing Expiration Date:   04/01/2019  . TSH    Standing Status:   Future    Standing Expiration Date:   04/01/2019  . Ambulatory referral to Radiation Oncology    Referral Priority:   Routine    Referral Type:   Consultation    Referral Reason:   Specialty Services Required    Requested Specialty:   Radiation Oncology    Number of Visits Requested:   1   All questions were answered. The patient knows to call the clinic with any problems, questions or concerns. No barriers to learning was detected. I spent 20 minutes counseling the patient face to face. The total time spent in the appointment was 25 minutes and more than 50% was on counseling, review of test results, and coordination of care.      Maria Feeling, NP 03/31/18   ADDENDUM: Hematology/Oncology Attending: I had a face-to-face encounter with the patient.  I recommended her care plan.  This is a very pleasant 84 years old white female with a stage IV non-small cell lung cancer, squamous cell carcinoma status  post previous course of palliative radiotherapy as well as 5 cycles of systemic chemotherapy with carboplatin and paclitaxel with partial response.  The patient had evidence for disease progression and she was started on treatment with Keytruda 200 mg IV every 3 weeks status post 2 cycles.  She has been tolerating this treatment well with no concerning complaints. The patient continues to have cough productive of blood-tinged sputum.  She was treated with a course of doxycycline with improvement of her condition but she has recurrence of her hemoptysis after she discontinued the antibiotics. I recommended for the patient to proceed with cycle #3 of her treatment with immunotherapy today as a schedule.  She will have repeat CT scan of the chest, abdomen and pelvis performed in 3 weeks for reevaluation before starting cycle #4. For the persistent hemoptysis, I will refer the patient back to Dr. Tammi Klippel for consideration of palliative radiotherapy to the right upper lobe lung mass if feasible. She was advised to call immediately if she has any concerning symptoms in the interval.  Disclaimer: This note was dictated with voice recognition software. Similar sounding words can inadvertently be transcribed and may be missed upon review. Maria Kempf, MD 04/01/18

## 2018-03-31 ENCOUNTER — Encounter: Payer: Self-pay | Admitting: Nurse Practitioner

## 2018-03-31 ENCOUNTER — Inpatient Hospital Stay: Payer: Medicare Other

## 2018-03-31 ENCOUNTER — Inpatient Hospital Stay (HOSPITAL_BASED_OUTPATIENT_CLINIC_OR_DEPARTMENT_OTHER): Payer: Medicare Other | Admitting: Nurse Practitioner

## 2018-03-31 ENCOUNTER — Telehealth: Payer: Self-pay | Admitting: Hematology

## 2018-03-31 VITALS — BP 133/53 | HR 71 | Temp 98.3°F | Resp 18 | Ht 60.0 in | Wt 148.4 lb

## 2018-03-31 DIAGNOSIS — C3491 Malignant neoplasm of unspecified part of right bronchus or lung: Secondary | ICD-10-CM

## 2018-03-31 DIAGNOSIS — C3490 Malignant neoplasm of unspecified part of unspecified bronchus or lung: Secondary | ICD-10-CM

## 2018-03-31 DIAGNOSIS — Z5112 Encounter for antineoplastic immunotherapy: Secondary | ICD-10-CM | POA: Diagnosis not present

## 2018-03-31 DIAGNOSIS — C3411 Malignant neoplasm of upper lobe, right bronchus or lung: Secondary | ICD-10-CM

## 2018-03-31 DIAGNOSIS — R5382 Chronic fatigue, unspecified: Secondary | ICD-10-CM

## 2018-03-31 DIAGNOSIS — R042 Hemoptysis: Secondary | ICD-10-CM

## 2018-03-31 DIAGNOSIS — Z95828 Presence of other vascular implants and grafts: Secondary | ICD-10-CM

## 2018-03-31 LAB — CMP (CANCER CENTER ONLY)
ALBUMIN: 2.7 g/dL — AB (ref 3.5–5.0)
ALK PHOS: 70 U/L (ref 40–150)
ALT: 12 U/L (ref 0–55)
AST: 14 U/L (ref 5–34)
Anion gap: 12 — ABNORMAL HIGH (ref 3–11)
BILIRUBIN TOTAL: 0.2 mg/dL (ref 0.2–1.2)
BUN: 11 mg/dL (ref 7–26)
CO2: 21 mmol/L — ABNORMAL LOW (ref 22–29)
CREATININE: 0.9 mg/dL (ref 0.60–1.10)
Calcium: 9.9 mg/dL (ref 8.4–10.4)
Chloride: 106 mmol/L (ref 98–109)
GFR, Est AFR Am: >60 mL/min (ref >60)
GFR, Estimated: 56 mL/min — ABNORMAL LOW (ref >60)
GLUCOSE: 210 mg/dL — AB (ref 70–140)
POTASSIUM: 3.6 mmol/L (ref 3.5–5.1)
Sodium: 139 mmol/L (ref 136–145)
Total Protein: 6.8 g/dL (ref 6.4–8.3)

## 2018-03-31 LAB — CBC WITH DIFFERENTIAL (CANCER CENTER ONLY)
BASOS ABS: 0 10*3/uL (ref 0.0–0.1)
BASOS PCT: 1 %
Eosinophils Absolute: 0.4 10*3/uL (ref 0.0–0.5)
Eosinophils Relative: 6 %
HEMATOCRIT: 31.3 % — AB (ref 34.8–46.6)
HEMOGLOBIN: 10.2 g/dL — AB (ref 11.6–15.9)
LYMPHS PCT: 10 %
Lymphs Abs: 0.7 10*3/uL — ABNORMAL LOW (ref 0.9–3.3)
MCH: 28.7 pg (ref 25.1–34.0)
MCHC: 32.6 g/dL (ref 31.5–36.0)
MCV: 87.9 fL (ref 79.5–101.0)
Monocytes Absolute: 0.8 10*3/uL (ref 0.1–0.9)
Monocytes Relative: 11 %
NEUTROS ABS: 5.2 10*3/uL (ref 1.5–6.5)
NEUTROS PCT: 72 %
Platelet Count: 236 10*3/uL (ref 145–400)
RBC: 3.56 MIL/uL — AB (ref 3.70–5.45)
RDW: 19 % — AB (ref 11.2–14.5)
WBC: 7.2 10*3/uL (ref 3.9–10.3)

## 2018-03-31 LAB — TSH: TSH: 0.251 u[IU]/mL — ABNORMAL LOW (ref 0.308–3.960)

## 2018-03-31 MED ORDER — SODIUM CHLORIDE 0.9% FLUSH
10.0000 mL | Freq: Once | INTRAVENOUS | Status: AC
  Administered 2018-03-31: 10 mL
  Filled 2018-03-31: qty 10

## 2018-03-31 MED ORDER — SODIUM CHLORIDE 0.9 % IV SOLN
200.0000 mg | Freq: Once | INTRAVENOUS | Status: AC
  Administered 2018-03-31: 200 mg via INTRAVENOUS
  Filled 2018-03-31: qty 8

## 2018-03-31 MED ORDER — PROCHLORPERAZINE MALEATE 10 MG PO TABS
ORAL_TABLET | ORAL | Status: AC
  Filled 2018-03-31: qty 1

## 2018-03-31 MED ORDER — SODIUM CHLORIDE 0.9% FLUSH
10.0000 mL | INTRAVENOUS | Status: DC | PRN
  Administered 2018-03-31: 10 mL
  Filled 2018-03-31: qty 10

## 2018-03-31 MED ORDER — SODIUM CHLORIDE 0.9 % IV SOLN
Freq: Once | INTRAVENOUS | Status: AC
  Administered 2018-03-31: 12:00:00 via INTRAVENOUS

## 2018-03-31 MED ORDER — HEPARIN SOD (PORK) LOCK FLUSH 100 UNIT/ML IV SOLN
500.0000 [IU] | Freq: Once | INTRAVENOUS | Status: AC | PRN
  Administered 2018-03-31: 500 [IU]
  Filled 2018-03-31: qty 5

## 2018-03-31 MED ORDER — PROCHLORPERAZINE MALEATE 10 MG PO TABS
10.0000 mg | ORAL_TABLET | Freq: Once | ORAL | Status: AC
  Administered 2018-03-31: 10 mg via ORAL

## 2018-03-31 NOTE — Progress Notes (Signed)
Prior auth for Triamcinolone 0.1% Lotion has been submitted. Status is pending.

## 2018-03-31 NOTE — Patient Instructions (Signed)
[facility] Discharge Instructions for Patients Receiving Chemotherapy  Today you received the following chemotherapy agents :  Keytruda.  To help prevent nausea and vomiting after your treatment, we encourage you to take your nausea medication as prescribed.   If you develop nausea and vomiting that is not controlled by your nausea medication, call the clinic.   BELOW ARE SYMPTOMS THAT SHOULD BE REPORTED IMMEDIATELY:  *FEVER GREATER THAN 100.5 F  *CHILLS WITH OR WITHOUT FEVER  NAUSEA AND VOMITING THAT IS NOT CONTROLLED WITH YOUR NAUSEA MEDICATION  *UNUSUAL SHORTNESS OF BREATH  *UNUSUAL BRUISING OR BLEEDING  TENDERNESS IN MOUTH AND THROAT WITH OR WITHOUT PRESENCE OF ULCERS  *URINARY PROBLEMS  *BOWEL PROBLEMS  UNUSUAL RASH Items with * indicate a potential emergency and should be followed up as soon as possible.  Feel free to call the clinic should you have any questions or concerns. The clinic phone number is [phone].  Please show the CHEMO ALERT CARD at check-in to the Emergency Department and triage nurse.  

## 2018-03-31 NOTE — Progress Notes (Signed)
03/31/18 @ [facility] to receive Beryle Flock, rash/eczema better per NP.  T.O. L Burton/NP  Henreitta Leber, PharmD

## 2018-03-31 NOTE — Telephone Encounter (Signed)
No los 4/23

## 2018-04-01 DIAGNOSIS — Z5112 Encounter for antineoplastic immunotherapy: Secondary | ICD-10-CM | POA: Insufficient documentation

## 2018-04-02 ENCOUNTER — Ambulatory Visit
Admission: RE | Admit: 2018-04-02 | Discharge: 2018-04-02 | Disposition: A | Discharge status: Home / Self Care | Payer: Medicare Other | Source: Ambulatory Visit | Attending: Radiation Oncology | Admitting: Radiation Oncology

## 2018-04-02 ENCOUNTER — Encounter: Payer: Self-pay | Admitting: Radiation Oncology

## 2018-04-02 ENCOUNTER — Telehealth: Payer: Self-pay | Admitting: *Deleted

## 2018-04-02 ENCOUNTER — Other Ambulatory Visit: Payer: Self-pay

## 2018-04-02 ENCOUNTER — Other Ambulatory Visit: Payer: Self-pay | Admitting: Oncology

## 2018-04-02 VITALS — BP 124/67 | HR 82 | Temp 97.9°F | Resp 18 | Wt 146.2 lb

## 2018-04-02 DIAGNOSIS — C3491 Malignant neoplasm of unspecified part of right bronchus or lung: Secondary | ICD-10-CM

## 2018-04-02 DIAGNOSIS — C3411 Malignant neoplasm of upper lobe, right bronchus or lung: Secondary | ICD-10-CM | POA: Insufficient documentation

## 2018-04-02 DIAGNOSIS — Z510 Encounter for antineoplastic radiation therapy: Secondary | ICD-10-CM | POA: Insufficient documentation

## 2018-04-02 DIAGNOSIS — R911 Solitary pulmonary nodule: Secondary | ICD-10-CM | POA: Insufficient documentation

## 2018-04-02 DIAGNOSIS — R590 Localized enlarged lymph nodes: Secondary | ICD-10-CM | POA: Insufficient documentation

## 2018-04-02 MED ORDER — OXYCODONE-ACETAMINOPHEN 5-325 MG PO TABS: 1.0000 | ORAL_TABLET | Freq: Four times a day (QID) | ORAL | 0 refills | Status: DC | PRN

## 2018-04-02 NOTE — Progress Notes (Signed)
Prior auth for triamcinolone 0.1% lotion was for Dr. Julien Nordmann. Denial letter given to Dr. Worthy Flank nurse instead of [place].

## 2018-04-02 NOTE — Progress Notes (Addendum)
Thoracic Location of Tumor / Histology: Stage IV (T2b, N3, M1 a) non-small cell lung cancer, squamous cell carcinoma presented with large right upper lobe lung mass in addition to bilateral mediastinal lymphadenopathy as well as left upper lobe lung nodule diagnosed in March 2018. PDL 1 expression is 5%.   Patient presented to her PCP Dr. Felipa Eth with [facility] back in March 2018.  Patient had 3 episodes of hemoptysis.  Chest xray revealed possible right upper lobe mass, which was later confirmed by CT scan.  Patient was treated by Dr. Valere Dross back in 02/25/02 - 04/13/02 for Stage II ductal carcinoma of the left breast. Dr. Tammi Klippel tx the patient's right lung in September 2018 2.5 Gy x 14 fractions. Patient returns today as a referral from Cira Rue, NP for consideration of radiation therapy in the manage of hemoptysis.      Tobacco/Marijuana/Snuff/ETOH use: Former smoker. Quit in 1994 after 60 years of smoking one ppd.  Past/Anticipated interventions by cardiothoracic surgery, if any: None  Past/Anticipated interventions by medical oncology, if any:  PRIOR THERAPY:  1. Systemic chemotherapy with carboplatin for AUC of 5 and paclitaxel 175 MG/M2 every 3 weeks. First dose 03/24/2017. Status post 5 cycles. Last cycle was given 06/18/2017. Discontinued secondary to fatigue, weakness, and significant peripheral neuropathy. 2. Palliative radiation therapy to RUL 2.5 Gy x14 fractions from 08/18/17 - 09/04/17 per Dr. Tammi Klippel   CURRENT THERAPY: Second line treatment with immunotherapy with Keytruda 200 mg IV every 3 weeks. First dose February 17, 2018.Status post 2 cycle.   Signs/Symptoms  Weight changes, if any: none  Respiratory complaints, if any: Cough is worse at night. Baseline dyspnea on exertion is unchanged  Hemoptysis, if any: hemoptysis improved for about 1 week but returned lately, occurs with "half a teaspoon" of bright red blood each time she coughs. Patient reports  since Monday (when she saw Lacie) she hasn't coughed up any blood until today. She reports the blood she coughed up today was minimal and rusted colored.   Pain issues, if any:  usual pain which she describes in her right breast and occasionally in her back. Reports she takes three oxycodone tablets throughout the day but her pain returns before the next dose is due. Describes said pain as achy and 8 on a scale of 0-10.   SAFETY ISSUES:  Prior radiation? yes  Pacemaker/ICD? yes   Possible current pregnancy?no  Is the patient on methotrexate? no  Current Complaints / other details:  84 year old female. Lives with daughter. Reports fatigue and lack of energy.  Mohamed/Lacie Kalman Shan, NP has ordered CT chest, abdomen and pelvis but it has not been scheduled yet.

## 2018-04-02 NOTE — Progress Notes (Signed)
Radiation Oncology         [phone] ________________________________  Outpatient Re-Consultation  Name: Maria Washington MRN: [id number]  Date of Service: 04/02/2018 DOB: Oct 05, 1929  [id number], Christiane Ha, MD  Curt Bears, MD   REFERRING PHYSICIAN: Curt Bears, MD  DIAGNOSIS: 84 y.o. female with Stage IV, T2bN3M1a, NSCLC, squamous cell carcinoma of the right upper lobe    ICD-10-CM   1. Primary lung cancer with metastasis from lung to other site, right [facility]) C34.91   2. Stage IV squamous cell carcinoma of right lung (HCC) C34.91     HISTORY OF PRESENT ILLNESS: [name] is a 84 y.o. female initially seen at the request of Dr. Julien Nordmann in August 2018 for a history of Stage IV, T2bN3M1a, NSCLC, squamous cell carcinoma of hte right upper lobe with metastatic disease to the left upper lobe, diagnosed in March 2018. She completed systemic therapy with Carboplatin/Taxol every 3 weeks between 03/24/2017-06/18/2017, but this was discontinued due to progressive neuropathy, which she has been on neurontin for. She proceeded with repeat staging CTs on 07/25/2017 which revealed a mixed response. She had regression in the LUL lesion and of the mediastinal adenopathy. The RUL lesion grew from 3.4 x 2.1 cm to 4.4 x 3.7 cm.    INTERVAL HISTORY: The patient returns today at the request for consideration of palliative radiotherapy to manage hemoptysis.  Since initial consultation, she underwent palliative radiation therapy to the RUL in September 2018. Repeat CT scan of the chest on 02/02/2018 showed evidence for disease progression with enlargement of the dominant cavitary right upper lobe lung mass as well as mediastinal lymphadenopathy. She was started on immunotherapy on 02/17/2018 with Keytruda every 3 weeks and is status post 2 cycles. She continued to have hemoptysis and was placed on antibiotics with initial improvement but has worsened again. Dr. Julien Nordmann has referred her to [place] today to  consider additional radiotherapy.  PREVIOUS RADIATION THERAPY: Yes  08/18/2017 - 09/04/2017: 14 fractions to RUL, treated by Dr. Tammi Klippel 02/25/2002 - 04/13/2002: 33 fractions to Left Breast, treated by Dr. Valere Dross  PAST MEDICAL HISTORY:  Past Medical History:  Diagnosis Date  . Aortic atherosclerosis ([place]) 02/10/2017  . Arthritis   . CAD (coronary artery disease), native coronary artery    3 vessel calcification noted on CT scan   . Cardiac pacemaker in situ 12/05/2011  . Chronic diastolic congestive heart failure ([place])   . COPD (chronic obstructive pulmonary disease) (HCC)    spot on lung  . Dehydration 06/04/2017  . Encounter for antineoplastic chemotherapy 03/04/2017  . Goals of care, counseling/discussion 03/17/2017  . History of breast cancer    Treated with lumpectomy, radiation, and chemo sees Dr. Ralene Ok   . Hypertensive heart disease without CHF   . Hypokalemia 04/18/2017  . Hypothyroid   . Mitral regurgitation   . Paroxysmal atrial fibrillation (HCC)    CHA2DS2VASC score 5  . Personal history of chemotherapy   . Personal history of radiation therapy   . Second degree heart block   . Stage IV squamous cell carcinoma of right lung ([place]) 03/04/2017      PAST SURGICAL HISTORY: Past Surgical History:  Procedure Laterality Date  . ABDOMINAL HYSTERECTOMY    . BLADDER SUSPENSION    . BREAST LUMPECTOMY Left    lumpectomy  . CARPAL TUNNEL RELEASE     left wrist  . CHOLECYSTECTOMY    . FINGER SURGERY    . IR FLUORO GUIDE PORT INSERTION RIGHT  03/12/2017  .  IR US GUIDE VASC ACCESS RIGHT  03/12/2017  . PERMANENT PACEMAKER INSERTION N/A 12/04/2011   Procedure: PERMANENT PACEMAKER INSERTION;  Surgeon: Evans Lance, MD;  Location: [facility] CATH LAB;  Service: Cardiovascular;  Laterality: N/A;  . SHOULDER SURGERY    . TONSILLECTOMY AND ADENOIDECTOMY      FAMILY HISTORY:  Family History  Problem Relation Age of Onset  . Heart attack Mother   . Cancer Brother        kidney  . Cancer  Daughter        lung  . Breast cancer Neg Hx     SOCIAL HISTORY:  Social History   Socioeconomic History  . Marital status: Widowed    Spouse name: Not on file  . Number of children: 6  . Years of education: 16  . Highest education level: Not on file  Occupational History  . Not on file  Social Needs  . Financial resource strain: Not on file  . Food insecurity:    Worry: Not on file    Inability: Not on file  . Transportation needs:    Medical: Not on file    Non-medical: Not on file  Tobacco Use  . Smoking status: Former Smoker    Packs/day: 1.00    Years: 60.00    Pack years: 60.00    Types: Cigarettes    Last attempt to quit: 06/07/2013    Years since quitting: 4.8  . Smokeless tobacco: Never Used  Substance and Sexual Activity  . Alcohol use: No  . Drug use: No  . Sexual activity: Not Currently  Lifestyle  . Physical activity:    Days per week: Not on file    Minutes per session: Not on file  . Stress: Not on file  Relationships  . Social connections:    Talks on phone: Not on file    Gets together: Not on file    Attends religious service: Not on file    Active member of club or organization: Not on file    Attends meetings of clubs or organizations: Not on file    Relationship status: Not on file  . Intimate partner violence:    Fear of current or ex partner: Not on file    Emotionally abused: Not on file    Physically abused: Not on file    Forced sexual activity: Not on file  Other Topics Concern  . Not on file  Social History Narrative   Widow.  Has 22 dogs.   Right handed    Caffeine use: Coffee- 2 cups per day or less   Decaf Mtn Dew   Lives with daughter    ALLERGIES: Clindamycin/lincomycin and Penicillins  MEDICATIONS:  Current Outpatient Medications  Medication Sig Dispense Refill  . alendronate (FOSAMAX) 70 MG tablet Take 70 mg by mouth every Monday.     . calcium-vitamin D (OSCAL WITH D) 500-200 MG-UNIT per tablet Take 1 tablet by  mouth 2 (two) times daily.      . cholecalciferol (VITAMIN D) 1000 units tablet Take 1,000 Units by mouth daily.     Marland Kitchen diltiazem (CARDIZEM CD) 120 MG 24 hr capsule Take 120 mg by mouth daily.  3  . diphenoxylate-atropine (LOMOTIL) 2.5-0.025 MG tablet Take 1 tablet by mouth 4 (four) times daily as needed for diarrhea or loose stools. May take qid if imodium is not working. 30 tablet 0  . ELIQUIS 2.5 MG TABS tablet Take 2.5 mg by mouth 2 (two)  times daily.  3  . folic acid (FOLVITE) 1 MG tablet Take 1 mg by mouth daily.      . furosemide (LASIX) 40 MG tablet Take 40 mg by mouth daily.     Marland Kitchen gabapentin (NEURONTIN) 300 MG capsule Take 1 capsule (300 mg total) by mouth as directed. Take 2 capsules in am ( total 600 mg) , one capsule (300 mg)  in the afternoon and 2 capsules (600 mg) hs 450 capsule 0  . levothyroxine (SYNTHROID, LEVOTHROID) 137 MCG tablet Take 137 mcg by mouth daily before breakfast.    . lidocaine-prilocaine (EMLA) cream Apply 1 application topically as needed. 30 g 1  . Omega-3 Fatty Acids (FISH OIL) 1200 MG CAPS Take 1,200 mg by mouth 2 (two) times daily.     . potassium chloride SA (K-DUR,KLOR-CON) 20 MEQ tablet Take 40 mEq by mouth 2 (two) times daily.     . vitamin B-12 (CYANOCOBALAMIN) 1000 MCG tablet Take 1,000 mcg by mouth daily.      Marland Kitchen oxyCODONE-acetaminophen (PERCOCET/ROXICET) 5-325 MG tablet Take 1 tablet by mouth every 6 (six) hours as needed for severe pain. 30 tablet 0  . sucralfate (CARAFATE) 1 g tablet Take 1 tablet (1 g total) by mouth 4 (four) times daily -  with meals and at bedtime. Crush tablet and dissolve in 6 oz of water. 120 tablet 1   No current facility-administered medications for this encounter.     REVIEW OF SYSTEMS:  On review of systems, the patient reports that she is doing well overall. She denies any chest pain, fevers, chills, night sweats, or unintended weight changes. She reports fatigue and lack of energy. She reports cough, worse at night. She  reports worsening hemoptysis in the past few weeks with bright red blood; however she has not coughed up any blood over the past 4 days. She reports dyspnea on exertion, unchanged from baseline. She denies any bowel or bladder disturbances, and denies abdominal pain, nausea or vomiting. She reports usual pain in her right breast and occasionally in her back. She describes the pain as achy and is 8/10. She reports she takes 3 oxycodone tablets throughout the day but her pain returns before the next dose is due. She denies new skin lesions or concerns. A complete review of systems is obtained and is otherwise negative.     PHYSICAL EXAM:  Wt Readings from Last 3 Encounters:  04/02/18 146 lb 3.2 oz (66.3 kg)  03/31/18 148 lb 6.4 oz (67.3 kg)  03/25/18 148 lb 11.2 oz (67.4 kg)   Temp Readings from Last 3 Encounters:  04/02/18 97.9 F (36.6 C) (Oral)  03/31/18 98.3 F (36.8 C) (Oral)  03/25/18 98 F (36.7 C) (Oral)   BP Readings from Last 3 Encounters:  04/02/18 124/67  03/31/18 (!) 133/53  03/25/18 115/64   Pulse Readings from Last 3 Encounters:  04/02/18 82  03/31/18 71  03/25/18 86   Pain Assessment Pain Score: 0-No pain/10  In general this is a well appearing caucasian femalein no acute distress. She's alert and oriented x4 and appropriate throughout the examination. Cardiopulmonary assessment is negative for acute distress and she exhibits normal effort.    KPS = 50  100 - Normal; no complaints; no evidence of disease. 90   - Able to carry on normal activity; minor signs or symptoms of disease. 80   - Normal activity with effort; some signs or symptoms of disease. 29   - Cares for self; unable  to carry on normal activity or to do active work. 60   - Requires occasional assistance, but is able to care for most of his personal needs. 50   - Requires considerable assistance and frequent medical care. 62   - Disabled; requires special care and assistance. 50   - Severely  disabled; hospital admission is indicated although death not imminent. 38   - Very sick; hospital admission necessary; active supportive treatment necessary. 10   - Moribund; fatal processes progressing rapidly. 0     - Dead  Karnofsky DA, Abelmann [place], Craver LS and Burchenal [facility] [phone]) The use of the nitrogen mustards in the palliative treatment of carcinoma: with particular reference to bronchogenic carcinoma Cancer 1 634-56  LABORATORY DATA:  Lab Results  Component Value Date   WBC 7.2 03/31/2018   HGB 10.2 (L) 03/31/2018   HCT 31.3 (L) 03/31/2018   MCV 87.9 03/31/2018   PLT 236 03/31/2018   Lab Results  Component Value Date   NA 139 03/31/2018   K 3.6 03/31/2018   CL 106 03/31/2018   CO2 21 (L) 03/31/2018   Lab Results  Component Value Date   ALT 12 03/31/2018   AST 14 03/31/2018   ALKPHOS 70 03/31/2018   BILITOT 0.2 03/31/2018     RADIOGRAPHY: Ct Chest W Contrast  Result Date: 04/08/2018 CLINICAL DATA:  Stage IV lung cancer. EXAM: CT CHEST, ABDOMEN, AND PELVIS WITH CONTRAST TECHNIQUE: Multidetector CT imaging of the chest, abdomen and pelvis was performed following the standard protocol during bolus administration of intravenous contrast. CONTRAST:  75mL OMNIPAQUE IOHEXOL 300 MG/ML  SOLN COMPARISON:  02/02/2018 FINDINGS: CT CHEST FINDINGS Cardiovascular: Normal heart size. Aortic atherosclerosis. Calcifications in the RCA, LAD and left circumflex coronary arteries noted. Mediastinum/Nodes: The trachea appears patent and is midline. Normal appearance of the esophagus. Index pre-vascular node measures 2.2 cm, image 24/2. Previously 1.2 cm. Adjacent pre-vascular nodal mass extending into the AP window 4.6 by 2.6 by 3.5 cm (volume = 22 cm^3), image 23/2. Previously 3.5 x 1.9 by 2.3 cm (volume = 8 cm^3). Right paratracheal node measures 1 cm, image 24/2. Stable from previous exam. Lungs/Pleura: No pleural effusion. The thick walled cavitary lung mass within the posteromedial right  upper lobe measures 7.6 by 5.1 by 6.9 cm (volume = 140 cm^3). Previously 7.1 x 5.6 by 6.2 cm (volume = 130 cm^3). Masslike architectural distortion within the superior segment of right lower lobe is similar to previous exam and may represent changes due to external beam radiation, image 64/6. New nodular density within the left upper lobe measures 1.5 cm, image 48/6. There is new surrounding pleural thickening within the right apex as well as ground-glass attenuation. Musculoskeletal: No chest wall mass or suspicious bone lesions identified. CT ABDOMEN PELVIS FINDINGS Hepatobiliary: No suspicious liver abnormality. Previous cholecystectomy with chronic increase caliber of the common bile duct and moderate intrahepatic biliary dilatation. Pancreas: Unremarkable. No pancreatic ductal dilatation or surrounding inflammatory changes. Spleen: Previously noted scattered low-attenuation foci within the spleen are less conspicuous on today's study. Spleen is normal in size. Adrenals/Urinary Tract: Normal adrenal glands. Small cyst with mural calcification arising from the posterior cortex of the left kidney is unchanged measuring 1.1 cm. Larger simple appearing cyst arising from upper pole of left kidney measures 5.6 cm. Urinary bladder appears normal. Stomach/Bowel: The stomach and small bowel loops have a normal caliber. No pathologic dilatation of the colon. Vascular/Lymphatic: Aortic atherosclerosis. No aneurysm. No upper abdominal adenopathy. No pelvic or  inguinal adenopathy. Reproductive: Status post hysterectomy. No adnexal masses. Other: No abdominal wall hernia or abnormality. No abdominopelvic ascites. Musculoskeletal: Degenerative disc disease identified within the lumbar spine. No aggressive lytic or sclerotic bone lesions. IMPRESSION: 1. Interval progression of disease. The thick walled cavitary mass within the right upper lobe has increased in size in the interval. There has also been interval increase in size  of left-sided pre-vascular adenopathy. A new nodule is identified within the left upper lobe. 2. No evidence for metastasis to the abdomen or pelvis. 3.  Aortic Atherosclerosis (ICD10-I70.0). 4. Three vessel coronary artery atherosclerotic calcifications. Electronically Signed   By: Kerby Moors M.D.   On: 04/08/2018 14:29   Ct Abdomen Pelvis W Contrast  Result Date: 04/08/2018 CLINICAL DATA:  Stage IV lung cancer. EXAM: CT CHEST, ABDOMEN, AND PELVIS WITH CONTRAST TECHNIQUE: Multidetector CT imaging of the chest, abdomen and pelvis was performed following the standard protocol during bolus administration of intravenous contrast. CONTRAST:  52mL OMNIPAQUE IOHEXOL 300 MG/ML  SOLN COMPARISON:  02/02/2018 FINDINGS: CT CHEST FINDINGS Cardiovascular: Normal heart size. Aortic atherosclerosis. Calcifications in the RCA, LAD and left circumflex coronary arteries noted. Mediastinum/Nodes: The trachea appears patent and is midline. Normal appearance of the esophagus. Index pre-vascular node measures 2.2 cm, image 24/2. Previously 1.2 cm. Adjacent pre-vascular nodal mass extending into the AP window 4.6 by 2.6 by 3.5 cm (volume = 22 cm^3), image 23/2. Previously 3.5 x 1.9 by 2.3 cm (volume = 8 cm^3). Right paratracheal node measures 1 cm, image 24/2. Stable from previous exam. Lungs/Pleura: No pleural effusion. The thick walled cavitary lung mass within the posteromedial right upper lobe measures 7.6 by 5.1 by 6.9 cm (volume = 140 cm^3). Previously 7.1 x 5.6 by 6.2 cm (volume = 130 cm^3). Masslike architectural distortion within the superior segment of right lower lobe is similar to previous exam and may represent changes due to external beam radiation, image 64/6. New nodular density within the left upper lobe measures 1.5 cm, image 48/6. There is new surrounding pleural thickening within the right apex as well as ground-glass attenuation. Musculoskeletal: No chest wall mass or suspicious bone lesions identified. CT  ABDOMEN PELVIS FINDINGS Hepatobiliary: No suspicious liver abnormality. Previous cholecystectomy with chronic increase caliber of the common bile duct and moderate intrahepatic biliary dilatation. Pancreas: Unremarkable. No pancreatic ductal dilatation or surrounding inflammatory changes. Spleen: Previously noted scattered low-attenuation foci within the spleen are less conspicuous on today's study. Spleen is normal in size. Adrenals/Urinary Tract: Normal adrenal glands. Small cyst with mural calcification arising from the posterior cortex of the left kidney is unchanged measuring 1.1 cm. Larger simple appearing cyst arising from upper pole of left kidney measures 5.6 cm. Urinary bladder appears normal. Stomach/Bowel: The stomach and small bowel loops have a normal caliber. No pathologic dilatation of the colon. Vascular/Lymphatic: Aortic atherosclerosis. No aneurysm. No upper abdominal adenopathy. No pelvic or inguinal adenopathy. Reproductive: Status post hysterectomy. No adnexal masses. Other: No abdominal wall hernia or abnormality. No abdominopelvic ascites. Musculoskeletal: Degenerative disc disease identified within the lumbar spine. No aggressive lytic or sclerotic bone lesions. IMPRESSION: 1. Interval progression of disease. The thick walled cavitary mass within the right upper lobe has increased in size in the interval. There has also been interval increase in size of left-sided pre-vascular adenopathy. A new nodule is identified within the left upper lobe. 2. No evidence for metastasis to the abdomen or pelvis. 3.  Aortic Atherosclerosis (ICD10-I70.0). 4. Three vessel coronary artery atherosclerotic calcifications. Electronically Signed  By: Kerby Moors M.D.   On: 04/08/2018 14:29      IMPRESSION/PLAN: 1. 84 y.o. female with Stage IV, T2bN3M1a, NSCLC, squamous cell carcinoma of the right upper lobe with metastatic disease to the left upper lobe. Today, I talked to the patient and family about the  findings and work-up thus far.  We discussed the natural history of lung cancer and general treatment, highlighting the role of palliative radiotherapy in the management of her hemoptysis.  We discussed the available radiation techniques, and focused on the details of logistics and delivery.  We reviewed the anticipated acute and late sequelae associated with radiation in this setting.  The patient was encouraged to ask questions that I answered to the best of my ability.  We discussed the risks, benefits, short, and long term effects of radiotherapy, and the patient is interested in proceeding. We discussed the delivery and logistics of radiotherapy and would recommend a palliative course of 10 fractions over 5 weeks. The patient would like to proceed with radiation and is scheduled for CT simulation today.  ------------------------------------------------   Tyler Pita, MD [place] Director and Director of Stereotactic Radiosurgery Direct Dial: [phone]  Fax: [phone] [email or web address]  Skype  [email or web address]   Page Me   This document serves as a record of services personally performed by Tyler Pita, MD. It was created on his behalf by Rae Lips, a trained medical scribe. The creation of this record is based on the scribe's personal observations and the provider's statements to them. This document has been checked and approved by the attending provider.

## 2018-04-02 NOTE — Telephone Encounter (Addendum)
"  Patient's family here now for pick up of pain refill.  We called and spoke with someone Monday to request refill."    Request routed, printed and delivered approximately at 2:55 pm to [facility] to best assist with this request.  Further communication with family by collaborative nurse.

## 2018-04-02 NOTE — Progress Notes (Signed)
Prior auth for Triamcinolone Acetonide 0.1% lotion was denied. Denial letter given to Dr. Ernestina Penna nurse for appeal.

## 2018-04-02 NOTE — Progress Notes (Signed)
See progress note under physician encounter. 

## 2018-04-03 NOTE — Progress Notes (Signed)
  Radiation Oncology         [phone] ________________________________  Name: Maria Washington MRN: [id number]  Date: 04/02/2018  DOB: February 05, 1929  SIMULATION AND TREATMENT PLANNING NOTE    ICD-10-CM   1. Stage IV squamous cell carcinoma of right lung (HCC) C34.91     DIAGNOSIS:  84 yo woman with hemoptysis from recurrent lung cancer  NARRATIVE:  The patient was brought to the [place].  Identity was confirmed.  All relevant records and images related to the planned course of therapy were reviewed.  The patient freely provided informed written consent to proceed with treatment after reviewing the details related to the planned course of therapy. The consent form was witnessed and verified by the simulation staff.  Then, the patient was set-up in a stable reproducible  supine position for radiation therapy.  CT images were obtained.  Surface markings were placed.  The CT images were loaded into the planning software.  Then the target and avoidance structures were contoured.  Treatment planning then occurred.  The radiation prescription was entered and confirmed.    I have requested : This treatment constitutes a Special Treatment Procedure for the following reason: [ Retreatment in a previously radiated area requiring careful monitoring of increased risk of toxicity due to overlap of previous treatment..  I have ordered:Nutrition Consult  PLAN:  The patient will receive 30 Gy in 10 fractions.  ________________________________  Sheral Apley Tammi Klippel, M.D.

## 2018-04-06 ENCOUNTER — Ambulatory Visit: Payer: Medicare Other

## 2018-04-06 ENCOUNTER — Ambulatory Visit: Payer: Medicare Other | Admitting: Radiation Oncology

## 2018-04-06 DIAGNOSIS — C3491 Malignant neoplasm of unspecified part of right bronchus or lung: Secondary | ICD-10-CM | POA: Diagnosis not present

## 2018-04-07 ENCOUNTER — Telehealth: Payer: Self-pay | Admitting: Radiation Oncology

## 2018-04-07 ENCOUNTER — Ambulatory Visit: Payer: Medicare Other

## 2018-04-07 NOTE — Telephone Encounter (Signed)
Received pacemaker documentation today that Dr. Wynonia Lawman wants the patient's pacemaker monitored during radiation treatment. Patient scheduled as a new start today at 1220. [place] reports she is unable to be present today to monitor the patient. Informed Katie, RT on L2 who plans to adjust the schedule and contact the patient. This RN will email Edmonia Lynch the patient's treatment schedule as requested. Tomi Bamberger committed to being present for the patient's radiation treatment tomorrow.

## 2018-04-08 ENCOUNTER — Ambulatory Visit: Admission: RE | Admit: 2018-04-08 | Payer: Medicare Other | Source: Ambulatory Visit

## 2018-04-08 ENCOUNTER — Ambulatory Visit (HOSPITAL_COMMUNITY)
Admission: RE | Admit: 2018-04-08 | Discharge: 2018-04-08 | Disposition: A | Discharge status: Home / Self Care | Payer: Medicare Other | Source: Ambulatory Visit | Attending: Nurse Practitioner | Admitting: Nurse Practitioner

## 2018-04-08 ENCOUNTER — Encounter (HOSPITAL_COMMUNITY): Payer: Self-pay

## 2018-04-08 ENCOUNTER — Telehealth: Payer: Self-pay | Admitting: *Deleted

## 2018-04-08 DIAGNOSIS — I700 Atherosclerosis of aorta: Secondary | ICD-10-CM | POA: Insufficient documentation

## 2018-04-08 DIAGNOSIS — I2510 Atherosclerotic heart disease of native coronary artery without angina pectoris: Secondary | ICD-10-CM | POA: Insufficient documentation

## 2018-04-08 DIAGNOSIS — C3490 Malignant neoplasm of unspecified part of unspecified bronchus or lung: Secondary | ICD-10-CM | POA: Diagnosis present

## 2018-04-08 MED ORDER — HEPARIN SOD (PORK) LOCK FLUSH 100 UNIT/ML IV SOLN
500.0000 [IU] | Freq: Once | INTRAVENOUS | Status: AC
  Administered 2018-04-08: 500 [IU] via INTRAVENOUS

## 2018-04-08 MED ORDER — IOHEXOL 300 MG/ML  SOLN
75.0000 mL | Freq: Once | INTRAMUSCULAR | Status: AC | PRN
  Administered 2018-04-08: 75 mL via INTRAVENOUS

## 2018-04-08 MED ORDER — HEPARIN SOD (PORK) LOCK FLUSH 100 UNIT/ML IV SOLN
INTRAVENOUS | Status: AC
  Filled 2018-04-08: qty 5

## 2018-04-09 ENCOUNTER — Ambulatory Visit
Admission: RE | Admit: 2018-04-09 | Discharge: 2018-04-09 | Disposition: A | Discharge status: Home / Self Care | Payer: Medicare Other | Source: Ambulatory Visit | Attending: Radiation Oncology | Admitting: Radiation Oncology

## 2018-04-09 DIAGNOSIS — C3491 Malignant neoplasm of unspecified part of right bronchus or lung: Secondary | ICD-10-CM | POA: Insufficient documentation

## 2018-04-09 DIAGNOSIS — Z87891 Personal history of nicotine dependence: Secondary | ICD-10-CM | POA: Diagnosis not present

## 2018-04-09 DIAGNOSIS — Z510 Encounter for antineoplastic radiation therapy: Secondary | ICD-10-CM | POA: Diagnosis not present

## 2018-04-10 ENCOUNTER — Telehealth: Payer: Self-pay | Admitting: Radiation Oncology

## 2018-04-10 ENCOUNTER — Other Ambulatory Visit: Payer: Self-pay | Admitting: Medical Oncology

## 2018-04-10 ENCOUNTER — Ambulatory Visit
Admission: RE | Admit: 2018-04-10 | Discharge: 2018-04-10 | Disposition: A | Discharge status: Home / Self Care | Payer: Medicare Other | Source: Ambulatory Visit | Attending: Radiation Oncology | Admitting: Radiation Oncology

## 2018-04-10 ENCOUNTER — Telehealth: Payer: Self-pay | Admitting: Medical Oncology

## 2018-04-10 DIAGNOSIS — C3491 Malignant neoplasm of unspecified part of right bronchus or lung: Secondary | ICD-10-CM

## 2018-04-10 MED ORDER — OXYCODONE-ACETAMINOPHEN 5-325 MG PO TABS: 1.0000 | ORAL_TABLET | Freq: Four times a day (QID) | ORAL | 0 refills | Status: DC | PRN

## 2018-04-10 NOTE — Telephone Encounter (Signed)
Per Dr. Landry Corporal the patient's pacemaker is to be monitored during radiation treatment. Informed by Faith, RT no one has arrive to monitor the patient during her 2:00 pm treatment. Phoned Dannial Monarch to inquire. She reports she "was caught up in a procedure and is on her way." Attempted to confirm appointment time for Monday but, Tomi Bamberger says she is aware. Informed Faith, RT of these findings. Faith reports the patient became weary of waiting and left.

## 2018-04-10 NOTE — Telephone Encounter (Signed)
Pt having increased pain -taking 4 percocet /day. I spoke to [facility] and she said the pain is controlled on current pain med. She requests a refill. Prescription given to [facility].

## 2018-04-13 ENCOUNTER — Ambulatory Visit
Admission: RE | Admit: 2018-04-13 | Discharge: 2018-04-13 | Disposition: A | Discharge status: Home / Self Care | Payer: Medicare Other | Source: Ambulatory Visit | Attending: Radiation Oncology | Admitting: Radiation Oncology

## 2018-04-13 DIAGNOSIS — C3491 Malignant neoplasm of unspecified part of right bronchus or lung: Secondary | ICD-10-CM | POA: Diagnosis not present

## 2018-04-14 ENCOUNTER — Encounter: Payer: Self-pay | Admitting: Radiation Oncology

## 2018-04-14 ENCOUNTER — Other Ambulatory Visit: Payer: Self-pay | Admitting: Urology

## 2018-04-14 ENCOUNTER — Ambulatory Visit
Admission: RE | Admit: 2018-04-14 | Discharge: 2018-04-14 | Disposition: A | Discharge status: Home / Self Care | Payer: Medicare Other | Source: Ambulatory Visit | Attending: Radiation Oncology | Admitting: Radiation Oncology

## 2018-04-14 DIAGNOSIS — C3491 Malignant neoplasm of unspecified part of right bronchus or lung: Secondary | ICD-10-CM | POA: Diagnosis not present

## 2018-04-14 MED ORDER — SUCRALFATE 1 G PO TABS: 1.0000 g | ORAL_TABLET | Freq: Three times a day (TID) | ORAL | 1 refills | Status: AC

## 2018-04-14 NOTE — Progress Notes (Signed)
Copy of email sent to Dannial Monarch of [employer] on 04/14/2018 at 1036.

## 2018-04-15 ENCOUNTER — Ambulatory Visit
Admission: RE | Admit: 2018-04-15 | Discharge: 2018-04-15 | Disposition: A | Discharge status: Home / Self Care | Payer: Medicare Other | Source: Ambulatory Visit | Attending: Radiation Oncology | Admitting: Radiation Oncology

## 2018-04-15 ENCOUNTER — Telehealth: Payer: Self-pay | Admitting: Radiation Oncology

## 2018-04-15 DIAGNOSIS — C3491 Malignant neoplasm of unspecified part of right bronchus or lung: Secondary | ICD-10-CM | POA: Diagnosis not present

## 2018-04-15 NOTE — Telephone Encounter (Signed)
-----   Message from [name], [place] sent at 04/14/2018  5:54 PM EDT ----- Regarding: RE: Mild discomfort with swallowing Contact: [phone] Please call patient and advise that prescription for Carafate has been sent ot her requested pharmacy. [name] ----- Message ----- From: Heywood Footman, RN Sent: 04/14/2018   4:00 PM To: [name], PA-C Subject: Mild discomfort with swallowing                Maria Washington.  Patient is having mild new onset discomfort associated with swallowing. During previous treatment she was prescribed Carafate by Dr. Tammi Klippel and it work well. The daughter was hoping you could send another script to [place] and [place]

## 2018-04-15 NOTE — Telephone Encounter (Signed)
Phoned patient's home. Explained Carafate has been electronically prescribed to [facility] and should be ready for pick up today. Patient verbalized understanding and expressed appreciation for the call.

## 2018-04-16 ENCOUNTER — Ambulatory Visit
Admission: RE | Admit: 2018-04-16 | Discharge: 2018-04-16 | Disposition: A | Discharge status: Home / Self Care | Payer: Medicare Other | Source: Ambulatory Visit | Attending: Radiation Oncology | Admitting: Radiation Oncology

## 2018-04-16 DIAGNOSIS — C3491 Malignant neoplasm of unspecified part of right bronchus or lung: Secondary | ICD-10-CM | POA: Diagnosis not present

## 2018-04-17 ENCOUNTER — Ambulatory Visit
Admission: RE | Admit: 2018-04-17 | Discharge: 2018-04-17 | Disposition: A | Discharge status: Home / Self Care | Payer: Medicare Other | Source: Ambulatory Visit | Attending: Radiation Oncology | Admitting: Radiation Oncology

## 2018-04-17 DIAGNOSIS — C3491 Malignant neoplasm of unspecified part of right bronchus or lung: Secondary | ICD-10-CM | POA: Diagnosis not present

## 2018-04-20 ENCOUNTER — Ambulatory Visit: Payer: Medicare Other

## 2018-04-20 ENCOUNTER — Ambulatory Visit
Admission: RE | Admit: 2018-04-20 | Discharge: 2018-04-20 | Disposition: A | Discharge status: Home / Self Care | Payer: Medicare Other | Source: Ambulatory Visit | Attending: Radiation Oncology | Admitting: Radiation Oncology

## 2018-04-20 DIAGNOSIS — C3491 Malignant neoplasm of unspecified part of right bronchus or lung: Secondary | ICD-10-CM | POA: Diagnosis not present

## 2018-04-21 ENCOUNTER — Telehealth: Payer: Self-pay

## 2018-04-21 ENCOUNTER — Inpatient Hospital Stay: Payer: Medicare Other | Attending: Internal Medicine

## 2018-04-21 ENCOUNTER — Inpatient Hospital Stay: Payer: Medicare Other

## 2018-04-21 ENCOUNTER — Inpatient Hospital Stay (HOSPITAL_BASED_OUTPATIENT_CLINIC_OR_DEPARTMENT_OTHER): Payer: Medicare Other | Admitting: Internal Medicine

## 2018-04-21 ENCOUNTER — Ambulatory Visit
Admission: RE | Admit: 2018-04-21 | Discharge: 2018-04-21 | Disposition: A | Discharge status: Home / Self Care | Payer: Medicare Other | Source: Ambulatory Visit | Attending: Radiation Oncology | Admitting: Radiation Oncology

## 2018-04-21 ENCOUNTER — Ambulatory Visit: Payer: Medicare Other

## 2018-04-21 ENCOUNTER — Encounter: Payer: Self-pay | Admitting: Radiation Oncology

## 2018-04-21 ENCOUNTER — Encounter: Payer: Self-pay | Admitting: Internal Medicine

## 2018-04-21 VITALS — BP 132/64 | HR 70 | Temp 97.7°F | Resp 18 | Ht 60.0 in | Wt 145.1 lb

## 2018-04-21 DIAGNOSIS — G620 Drug-induced polyneuropathy: Secondary | ICD-10-CM | POA: Insufficient documentation

## 2018-04-21 DIAGNOSIS — C3491 Malignant neoplasm of unspecified part of right bronchus or lung: Secondary | ICD-10-CM

## 2018-04-21 DIAGNOSIS — Z5111 Encounter for antineoplastic chemotherapy: Secondary | ICD-10-CM | POA: Diagnosis present

## 2018-04-21 DIAGNOSIS — Z95828 Presence of other vascular implants and grafts: Secondary | ICD-10-CM

## 2018-04-21 DIAGNOSIS — C3411 Malignant neoplasm of upper lobe, right bronchus or lung: Secondary | ICD-10-CM | POA: Diagnosis present

## 2018-04-21 DIAGNOSIS — Z452 Encounter for adjustment and management of vascular access device: Secondary | ICD-10-CM | POA: Diagnosis not present

## 2018-04-21 DIAGNOSIS — Z79899 Other long term (current) drug therapy: Secondary | ICD-10-CM | POA: Insufficient documentation

## 2018-04-21 LAB — CBC WITH DIFFERENTIAL (CANCER CENTER ONLY)
Basophils Absolute: 0 10*3/uL (ref 0.0–0.1)
Basophils Relative: 1 %
Eosinophils Absolute: 0.3 10*3/uL (ref 0.0–0.5)
Eosinophils Relative: 4 %
HCT: 30.8 % — ABNORMAL LOW (ref 34.8–46.6)
Hemoglobin: 10.2 g/dL — ABNORMAL LOW (ref 11.6–15.9)
LYMPHS PCT: 8 %
Lymphs Abs: 0.5 10*3/uL — ABNORMAL LOW (ref 0.9–3.3)
MCH: 29.4 pg (ref 25.1–34.0)
MCHC: 33.2 g/dL (ref 31.5–36.0)
MCV: 88.5 fL (ref 79.5–101.0)
MONO ABS: 0.7 10*3/uL (ref 0.1–0.9)
MONOS PCT: 13 %
NEUTROS ABS: 4.3 10*3/uL (ref 1.5–6.5)
Neutrophils Relative %: 74 %
Platelet Count: 239 10*3/uL (ref 145–400)
RBC: 3.48 MIL/uL — ABNORMAL LOW (ref 3.70–5.45)
RDW: 19 % — AB (ref 11.2–14.5)
WBC Count: 5.8 10*3/uL (ref 3.9–10.3)

## 2018-04-21 LAB — CMP (CANCER CENTER ONLY)
ALT: 10 U/L (ref 0–55)
AST: 12 U/L (ref 5–34)
Albumin: 2.9 g/dL — ABNORMAL LOW (ref 3.5–5.0)
Alkaline Phosphatase: 59 U/L (ref 40–150)
Anion gap: 9 (ref 3–11)
BILIRUBIN TOTAL: 0.2 mg/dL (ref 0.2–1.2)
BUN: 11 mg/dL (ref 7–26)
CO2: 25 mmol/L (ref 22–29)
Calcium: 9.4 mg/dL (ref 8.4–10.4)
Chloride: 106 mmol/L (ref 98–109)
Creatinine: 0.85 mg/dL (ref 0.60–1.10)
GFR, EST NON AFRICAN AMERICAN: 60 mL/min — AB (ref >60)
GFR, Est AFR Am: >60 mL/min (ref >60)
GLUCOSE: 164 mg/dL — AB (ref 70–140)
POTASSIUM: 4 mmol/L (ref 3.5–5.1)
Sodium: 140 mmol/L (ref 136–145)
Total Protein: 7 g/dL (ref 6.4–8.3)

## 2018-04-21 LAB — TSH: TSH: 0.177 u[IU]/mL — ABNORMAL LOW (ref 0.308–3.960)

## 2018-04-21 MED ORDER — SODIUM CHLORIDE 0.9% FLUSH
10.0000 mL | Freq: Once | INTRAVENOUS | Status: AC
  Administered 2018-04-21: 10 mL
  Filled 2018-04-21: qty 10

## 2018-04-21 MED ORDER — HEPARIN SOD (PORK) LOCK FLUSH 100 UNIT/ML IV SOLN
500.0000 [IU] | Freq: Once | INTRAVENOUS | Status: AC
  Administered 2018-04-21: 500 [IU]
  Filled 2018-04-21: qty 5

## 2018-04-21 NOTE — Progress Notes (Signed)
DISCONTINUE ON PATHWAY REGIMEN - Non-Small Cell Lung     A cycle is 21 days:     Pembrolizumab   **Always confirm dose/schedule in your pharmacy ordering system**    REASON: Disease Progression PRIOR TREATMENT: [id number]: Pembrolizumab 200 mg q21 Days Until Disease Progression, Unacceptable Toxicity, or up to 24 Months TREATMENT RESPONSE: Progressive Disease (PD)  Non-Small Cell Lung - No Medical Intervention - Off Treatment.  Patient Characteristics: Stage IV Metastatic, Squamous, PS = 0, 1, Third Line, Prior PD-1/PD-L1 Inhibitor or No Prior PD-1/PD-L1 Inhibitor and Not a Candidate for Immunotherapy AJCC T Category: T2b Current Disease Status: Distant Metastases AJCC N Category: N3 AJCC M Category: M1a AJCC 8 Stage Grouping: IVA Histology: Squamous Cell Line of therapy: Third Line PD-L1 Expression Status: PD-L1 Positive 1-49% (TPS) Performance Status: PS = 0, 1 Immunotherapy Candidate Status: Not a Candidate for Immunotherapy Prior Immunotherapy Status: Prior PD-1/PD-L1 Inhibitor

## 2018-04-21 NOTE — Telephone Encounter (Signed)
Patient's daughter Lucita Ferrara called for Dr. Burr Medico.  Questions: 1) How long will she have left with treatments?  2) How long will she have if she does nothing?  Please call her at [phone]

## 2018-04-21 NOTE — Progress Notes (Signed)
[place] [phone]   Fax:([phone]) [phone]  OFFICE PROGRESS NOTE  Lajean Manes, MD [address] [place] [address]  DIAGNOSIS: Stage IV (T2b, N3, M1 a) non-small cell lung cancer, squamous cell carcinoma presented with large right upper lobe lung mass in addition to bilateral mediastinal lymphadenopathy as well as left upper lobe lung nodule diagnosed in March 2018. PDL 1 expression is 5%.   PRIOR THERAPY:  1) Systemic chemotherapy with carboplatin for AUC of 5 and paclitaxel 175 MG/M2 every 3 weeks. First dose 03/24/2017. Status post 5 cycles. Last cycle was given 06/18/2017. Discontinued secondary to intolerance and significant peripheral neuropathy. 2) Second line treatment with immunotherapy with Keytruda 200 mg IV every 3 weeks.  First dose February 17, 2018.  Status post 3 cycles. 3) palliative radiotherapy to the large right upper lobe lung mass.  CURRENT THERAPY: Systemic chemotherapy with gemcitabine 1000 mg/M2 on days 1 and 8 every 3 weeks.  First dose May 06, 2018.  INTERVAL HISTORY: Maria Washington 84 y.o. female returns to the clinic today for follow-up visit accompanied by her daughter.  The patient is currently undergoing palliative radiotherapy to the right upper lobe lung mass that was causing her to have hemoptysis.  Her hemoptysis has significantly improved after the radiotherapy.  She still has one fraction of radiotherapy to complete the treatment.  She continued to have shortness of breath with exertion but no significant chest pain or hemoptysis.  She denied having any recent weight loss or night sweats.  She has no nausea, vomiting, diarrhea or constipation.  She has no fever or chills.  The patient had repeat CT scan of the chest performed recently and she is here for reevaluation and discussion of her treatment options.  MEDICAL HISTORY: Past Medical History:  Diagnosis Date  . Aortic atherosclerosis ([facility]) 02/10/2017    . Arthritis   . CAD (coronary artery disease), native coronary artery    3 vessel calcification noted on CT scan   . Cardiac pacemaker in situ 12/05/2011  . Chronic diastolic congestive heart failure ([facility])   . COPD (chronic obstructive pulmonary disease) ([facility])    spot on lung  . Dehydration 06/04/2017  . Encounter for antineoplastic chemotherapy 03/04/2017  . Goals of care, counseling/discussion 03/17/2017  . History of breast cancer    Treated with lumpectomy, radiation, and chemo sees Dr. Ralene Ok   . Hypertensive heart disease without CHF   . Hypokalemia 04/18/2017  . Hypothyroid   . Mitral regurgitation   . Paroxysmal atrial fibrillation ([facility])    CHA2DS2VASC score 5  . Personal history of chemotherapy   . Personal history of radiation therapy   . Second degree heart block   . Stage IV squamous cell carcinoma of right lung ([facility]) 03/04/2017    ALLERGIES:  is allergic to clindamycin/lincomycin and penicillins.  MEDICATIONS:  Current Outpatient Medications  Medication Sig Dispense Refill  . alendronate (FOSAMAX) 70 MG tablet Take 70 mg by mouth every Monday.     . calcium-vitamin D (OSCAL WITH D) 500-200 MG-UNIT per tablet Take 1 tablet by mouth 2 (two) times daily.      . cholecalciferol (VITAMIN D) 1000 units tablet Take 1,000 Units by mouth daily.     Marland Kitchen diltiazem (CARDIZEM CD) 120 MG 24 hr capsule Take 120 mg by mouth daily.  3  . diphenoxylate-atropine (LOMOTIL) 2.5-0.025 MG tablet Take 1 tablet by mouth 4 (four) times daily as needed  for diarrhea or loose stools. May take qid if imodium is not working. 30 tablet 0  . ELIQUIS 2.5 MG TABS tablet Take 2.5 mg by mouth 2 (two) times daily.  3  . folic acid (FOLVITE) 1 MG tablet Take 1 mg by mouth daily.      . furosemide (LASIX) 40 MG tablet Take 40 mg by mouth daily.     Marland Kitchen gabapentin (NEURONTIN) 300 MG capsule Take 1 capsule (300 mg total) by mouth as directed. Take 2 capsules in am ( total 600 mg) , one capsule (300 mg)  in the  afternoon and 2 capsules (600 mg) hs 450 capsule 0  . levothyroxine (SYNTHROID, LEVOTHROID) 137 MCG tablet Take 137 mcg by mouth daily before breakfast.    . lidocaine-prilocaine (EMLA) cream Apply 1 application topically as needed. 30 g 1  . Omega-3 Fatty Acids (FISH OIL) 1200 MG CAPS Take 1,200 mg by mouth 2 (two) times daily.     Marland Kitchen oxyCODONE-acetaminophen (PERCOCET/ROXICET) 5-325 MG tablet Take 1 tablet by mouth every 6 (six) hours as needed for severe pain. 30 tablet 0  . potassium chloride SA (K-DUR,KLOR-CON) 20 MEQ tablet Take 40 mEq by mouth 2 (two) times daily.     . sucralfate (CARAFATE) 1 g tablet Take 1 tablet (1 g total) by mouth 4 (four) times daily -  with meals and at bedtime. Crush tablet and dissolve in 6 oz of water. 120 tablet 1  . vitamin B-12 (CYANOCOBALAMIN) 1000 MCG tablet Take 1,000 mcg by mouth daily.       No current facility-administered medications for this visit.     SURGICAL HISTORY:  Past Surgical History:  Procedure Laterality Date  . ABDOMINAL HYSTERECTOMY    . BLADDER SUSPENSION    . BREAST LUMPECTOMY Left    lumpectomy  . CARPAL TUNNEL RELEASE     left wrist  . CHOLECYSTECTOMY    . FINGER SURGERY    . IR FLUORO GUIDE PORT INSERTION RIGHT  03/12/2017  . IR US GUIDE VASC ACCESS RIGHT  03/12/2017  . PERMANENT PACEMAKER INSERTION N/A 12/04/2011   Procedure: PERMANENT PACEMAKER INSERTION;  Surgeon: Evans Lance, MD;  Location: [facility] CATH LAB;  Service: Cardiovascular;  Laterality: N/A;  . SHOULDER SURGERY    . TONSILLECTOMY AND ADENOIDECTOMY      REVIEW OF SYSTEMS:  Constitutional: positive for fatigue Eyes: negative Ears, nose, mouth, throat, and face: negative Respiratory: positive for cough and dyspnea on exertion Cardiovascular: negative Gastrointestinal: negative Genitourinary:negative Integument/breast: negative Hematologic/lymphatic: negative Musculoskeletal:positive for muscle weakness Neurological: negative Behavioral/Psych:  negative Endocrine: negative Allergic/Immunologic: negative   PHYSICAL EXAMINATION: General appearance: alert, cooperative, fatigued and no distress Head: Normocephalic, without obvious abnormality, atraumatic Neck: no adenopathy, no JVD, supple, symmetrical, trachea midline and thyroid not enlarged, symmetric, no tenderness/mass/nodules Lymph nodes: Cervical, supraclavicular, and axillary nodes normal. Resp: diminished breath sounds RLL and dullness to percussion RLL Back: symmetric, no curvature. ROM normal. No CVA tenderness. Cardio: regular rate and rhythm, S1, S2 normal, no murmur, click, rub or gallop GI: soft, non-tender; bowel sounds normal; no masses,  no organomegaly Extremities: extremities normal, atraumatic, no cyanosis or edema Neurologic: Alert and oriented X 3, normal strength and tone. Normal symmetric reflexes. Normal coordination and gait  ECOG PERFORMANCE STATUS: 1 - Symptomatic but completely ambulatory  Blood pressure 132/64, pulse 70, temperature 97.7 F (36.5 C), temperature source Oral, resp. rate 18, height 5' (1.524 m), weight 145 lb 1.6 oz (65.8 kg), SpO2 100 %.  LABORATORY DATA: Lab Results  Component Value Date   WBC 5.8 04/21/2018   HGB 10.2 (L) 04/21/2018   HCT 30.8 (L) 04/21/2018   MCV 88.5 04/21/2018   PLT 239 04/21/2018      Chemistry      Component Value Date/Time   NA 140 04/21/2018 0813   NA 141 11/03/2017 0834   K 4.0 04/21/2018 0813   K 3.9 11/03/2017 0834   CL 106 04/21/2018 0813   CO2 25 04/21/2018 0813   CO2 23 11/03/2017 0834   BUN 11 04/21/2018 0813   BUN 10.1 11/03/2017 0834   CREATININE 0.85 04/21/2018 0813   CREATININE 0.8 11/03/2017 0834      Component Value Date/Time   CALCIUM 9.4 04/21/2018 0813   CALCIUM 9.6 11/03/2017 0834   ALKPHOS 59 04/21/2018 0813   ALKPHOS 61 11/03/2017 0834   AST 12 04/21/2018 0813   AST 13 11/03/2017 0834   ALT 10 04/21/2018 0813   ALT 11 11/03/2017 0834   BILITOT 0.2 04/21/2018 0813    BILITOT 0.31 11/03/2017 0834       RADIOGRAPHIC STUDIES: Ct Chest W Contrast  Result Date: 04/08/2018 CLINICAL DATA:  Stage IV lung cancer. EXAM: CT CHEST, ABDOMEN, AND PELVIS WITH CONTRAST TECHNIQUE: Multidetector CT imaging of the chest, abdomen and pelvis was performed following the standard protocol during bolus administration of intravenous contrast. CONTRAST:  45mL OMNIPAQUE IOHEXOL 300 MG/ML  SOLN COMPARISON:  02/02/2018 FINDINGS: CT CHEST FINDINGS Cardiovascular: Normal heart size. Aortic atherosclerosis. Calcifications in the RCA, LAD and left circumflex coronary arteries noted. Mediastinum/Nodes: The trachea appears patent and is midline. Normal appearance of the esophagus. Index pre-vascular node measures 2.2 cm, image 24/2. Previously 1.2 cm. Adjacent pre-vascular nodal mass extending into the AP window 4.6 by 2.6 by 3.5 cm (volume = 22 cm^3), image 23/2. Previously 3.5 x 1.9 by 2.3 cm (volume = 8 cm^3). Right paratracheal node measures 1 cm, image 24/2. Stable from previous exam. Lungs/Pleura: No pleural effusion. The thick walled cavitary lung mass within the posteromedial right upper lobe measures 7.6 by 5.1 by 6.9 cm (volume = 140 cm^3). Previously 7.1 x 5.6 by 6.2 cm (volume = 130 cm^3). Masslike architectural distortion within the superior segment of right lower lobe is similar to previous exam and may represent changes due to external beam radiation, image 64/6. New nodular density within the left upper lobe measures 1.5 cm, image 48/6. There is new surrounding pleural thickening within the right apex as well as ground-glass attenuation. Musculoskeletal: No chest wall mass or suspicious bone lesions identified. CT ABDOMEN PELVIS FINDINGS Hepatobiliary: No suspicious liver abnormality. Previous cholecystectomy with chronic increase caliber of the common bile duct and moderate intrahepatic biliary dilatation. Pancreas: Unremarkable. No pancreatic ductal dilatation or surrounding inflammatory  changes. Spleen: Previously noted scattered low-attenuation foci within the spleen are less conspicuous on today's study. Spleen is normal in size. Adrenals/Urinary Tract: Normal adrenal glands. Small cyst with mural calcification arising from the posterior cortex of the left kidney is unchanged measuring 1.1 cm. Larger simple appearing cyst arising from upper pole of left kidney measures 5.6 cm. Urinary bladder appears normal. Stomach/Bowel: The stomach and small bowel loops have a normal caliber. No pathologic dilatation of the colon. Vascular/Lymphatic: Aortic atherosclerosis. No aneurysm. No upper abdominal adenopathy. No pelvic or inguinal adenopathy. Reproductive: Status post hysterectomy. No adnexal masses. Other: No abdominal wall hernia or abnormality. No abdominopelvic ascites. Musculoskeletal: Degenerative disc disease identified within the lumbar spine. No aggressive lytic or  sclerotic bone lesions. IMPRESSION: 1. Interval progression of disease. The thick walled cavitary mass within the right upper lobe has increased in size in the interval. There has also been interval increase in size of left-sided pre-vascular adenopathy. A new nodule is identified within the left upper lobe. 2. No evidence for metastasis to the abdomen or pelvis. 3.  Aortic Atherosclerosis (ICD10-I70.0). 4. Three vessel coronary artery atherosclerotic calcifications. Electronically Signed   By: Kerby Moors M.D.   On: 04/08/2018 14:29   Ct Abdomen Pelvis W Contrast  Result Date: 04/08/2018 CLINICAL DATA:  Stage IV lung cancer. EXAM: CT CHEST, ABDOMEN, AND PELVIS WITH CONTRAST TECHNIQUE: Multidetector CT imaging of the chest, abdomen and pelvis was performed following the standard protocol during bolus administration of intravenous contrast. CONTRAST:  42mL OMNIPAQUE IOHEXOL 300 MG/ML  SOLN COMPARISON:  02/02/2018 FINDINGS: CT CHEST FINDINGS Cardiovascular: Normal heart size. Aortic atherosclerosis. Calcifications in the RCA,  LAD and left circumflex coronary arteries noted. Mediastinum/Nodes: The trachea appears patent and is midline. Normal appearance of the esophagus. Index pre-vascular node measures 2.2 cm, image 24/2. Previously 1.2 cm. Adjacent pre-vascular nodal mass extending into the AP window 4.6 by 2.6 by 3.5 cm (volume = 22 cm^3), image 23/2. Previously 3.5 x 1.9 by 2.3 cm (volume = 8 cm^3). Right paratracheal node measures 1 cm, image 24/2. Stable from previous exam. Lungs/Pleura: No pleural effusion. The thick walled cavitary lung mass within the posteromedial right upper lobe measures 7.6 by 5.1 by 6.9 cm (volume = 140 cm^3). Previously 7.1 x 5.6 by 6.2 cm (volume = 130 cm^3). Masslike architectural distortion within the superior segment of right lower lobe is similar to previous exam and may represent changes due to external beam radiation, image 64/6. New nodular density within the left upper lobe measures 1.5 cm, image 48/6. There is new surrounding pleural thickening within the right apex as well as ground-glass attenuation. Musculoskeletal: No chest wall mass or suspicious bone lesions identified. CT ABDOMEN PELVIS FINDINGS Hepatobiliary: No suspicious liver abnormality. Previous cholecystectomy with chronic increase caliber of the common bile duct and moderate intrahepatic biliary dilatation. Pancreas: Unremarkable. No pancreatic ductal dilatation or surrounding inflammatory changes. Spleen: Previously noted scattered low-attenuation foci within the spleen are less conspicuous on today's study. Spleen is normal in size. Adrenals/Urinary Tract: Normal adrenal glands. Small cyst with mural calcification arising from the posterior cortex of the left kidney is unchanged measuring 1.1 cm. Larger simple appearing cyst arising from upper pole of left kidney measures 5.6 cm. Urinary bladder appears normal. Stomach/Bowel: The stomach and small bowel loops have a normal caliber. No pathologic dilatation of the colon.  Vascular/Lymphatic: Aortic atherosclerosis. No aneurysm. No upper abdominal adenopathy. No pelvic or inguinal adenopathy. Reproductive: Status post hysterectomy. No adnexal masses. Other: No abdominal wall hernia or abnormality. No abdominopelvic ascites. Musculoskeletal: Degenerative disc disease identified within the lumbar spine. No aggressive lytic or sclerotic bone lesions. IMPRESSION: 1. Interval progression of disease. The thick walled cavitary mass within the right upper lobe has increased in size in the interval. There has also been interval increase in size of left-sided pre-vascular adenopathy. A new nodule is identified within the left upper lobe. 2. No evidence for metastasis to the abdomen or pelvis. 3.  Aortic Atherosclerosis (ICD10-I70.0). 4. Three vessel coronary artery atherosclerotic calcifications. Electronically Signed   By: Kerby Moors M.D.   On: 04/08/2018 14:29    ASSESSMENT AND PLAN:  This is a very pleasant 84 years old white female with a stage  IV non-small cell lung cancer, adenocarcinoma with PDL 1 expression of 5%. The patient underwent systemic chemotherapy with carboplatin and paclitaxel, status post 5 cycles and has been tolerating her treatment well except for increasing fatigue and weakness as well as development of peripheral neuropathy. Last dose of chemotherapy was in July 2018.   The patient was a started on treatment with Keytruda status post 3 cycles discontinued secondary to disease progression. She had repeat CT scan of the chest, abdomen and pelvis performed recently. I personally and independently reviewed the scan images and discussed the results with the patient and her daughter.  Unfortunately her scan showed further disease progression with increase in the size of the cavitary mass within the right upper lobe there was also interval increase in the size of the left prevascular lymphadenopathy and a new nodule identified within the left upper lobe.  There  was no evidence of metastatic disease to the abdomen or pelvis. I had a lengthy discussion with the patient and her daughter about her current condition and treatment options.  I gave the patient the option of palliative care and hospice referral versus consideration of second line chemotherapy with single agent gemcitabine 1000 mg/M2 on days 1 and 8 every 3 weeks.  The patient is interested in proceeding with the systemic chemotherapy. I discussed with her the adverse effect of this treatment including but not limited to mild alopecia, myelosuppression, nausea and vomiting, peripheral neuropathy, liver or renal dysfunction. She is expected to start the first cycle of this treatment on May 06, 2018. For the hemoptysis, the patient is currently undergoing palliative radiotherapy to the right upper lobe lung mass under the care of Dr. Tammi Klippel and this is expected to be completed tomorrow.  She has significant improvement in the hemoptysis. She will come back for follow-up visit with the start of the first cycle of her chemotherapy. The patient was advised to call immediately if she has any concerning symptoms in the interval. The patient voices understanding of current disease status and treatment options and is in agreement with the current care plan. All questions were answered. The patient knows to call the clinic with any problems, questions or concerns. We can certainly see the patient much sooner if necessary.  Disclaimer: This note was dictated with voice recognition software. Similar sounding words can inadvertently be transcribed and may not be corrected upon review.

## 2018-04-21 NOTE — Patient Instructions (Signed)
Implanted Port Home Guide An implanted port is a type of central line that is placed under the skin. Central lines are used to provide IV access when treatment or nutrition needs to be given through a person's veins. Implanted ports are used for long-term IV access. An implanted port may be placed because:  You need IV medicine that would be irritating to the small veins in your hands or arms.  You need long-term IV medicines, such as antibiotics.  You need IV nutrition for a long period.  You need frequent blood draws for lab tests.  You need dialysis.  Implanted ports are usually placed in the chest area, but they can also be placed in the upper arm, the abdomen, or the leg. An implanted port has two main parts:  Reservoir. The reservoir is round and will appear as a small, raised area under your skin. The reservoir is the part where a needle is inserted to give medicines or draw blood.  Catheter. The catheter is a thin, flexible tube that extends from the reservoir. The catheter is placed into a large vein. Medicine that is inserted into the reservoir goes into the catheter and then into the vein.  How will I care for my incision site? Do not get the incision site wet. Bathe or shower as directed by your health care provider. How is my port accessed? Special steps must be taken to access the port:  Before the port is accessed, a numbing cream can be placed on the skin. This helps numb the skin over the port site.  Your health care provider uses a sterile technique to access the port. ? Your health care provider must put on a mask and sterile gloves. ? The skin over your port is cleaned carefully with an antiseptic and allowed to dry. ? The port is gently pinched between sterile gloves, and a needle is inserted into the port.  Only "non-coring" port needles should be used to access the port. Once the port is accessed, a blood return should be checked. This helps ensure that the port  is in the vein and is not clogged.  If your port needs to remain accessed for a constant infusion, a clear (transparent) bandage will be placed over the needle site. The bandage and needle will need to be changed every week, or as directed by your health care provider.  Keep the bandage covering the needle clean and dry. Do not get it wet. Follow your health care provider's instructions on how to take a shower or bath while the port is accessed.  If your port does not need to stay accessed, no bandage is needed over the port.  What is flushing? Flushing helps keep the port from getting clogged. Follow your health care provider's instructions on how and when to flush the port. Ports are usually flushed with saline solution or a medicine called heparin. The need for flushing will depend on how the port is used.  If the port is used for intermittent medicines or blood draws, the port will need to be flushed: ? After medicines have been given. ? After blood has been drawn. ? As part of routine maintenance.  If a constant infusion is running, the port may not need to be flushed.  How long will my port stay implanted? The port can stay in for as long as your health care provider thinks it is needed. When it is time for the port to come out, surgery will be   done to remove it. The procedure is similar to the one performed when the port was put in. When should I seek immediate medical care? When you have an implanted port, you should seek immediate medical care if:  You notice a bad smell coming from the incision site.  You have swelling, redness, or drainage at the incision site.  You have more swelling or pain at the port site or the surrounding area.  You have a fever that is not controlled with medicine.  This information is not intended to replace advice given to you by your health care provider. Make sure you discuss any questions you have with your health care provider. Document  Released: 11/25/2005 Document Revised: 05/02/2016 Document Reviewed: 08/02/2013 Elsevier Interactive Patient Education  2017 Elsevier Inc.  

## 2018-04-22 ENCOUNTER — Other Ambulatory Visit: Payer: Self-pay | Admitting: Medical Oncology

## 2018-04-22 ENCOUNTER — Ambulatory Visit: Admission: RE | Admit: 2018-04-22 | Payer: Medicare Other | Source: Ambulatory Visit

## 2018-04-22 ENCOUNTER — Telehealth: Payer: Self-pay | Admitting: *Deleted

## 2018-04-22 ENCOUNTER — Telehealth: Payer: Self-pay | Admitting: Hematology

## 2018-04-22 DIAGNOSIS — C3491 Malignant neoplasm of unspecified part of right bronchus or lung: Secondary | ICD-10-CM

## 2018-04-22 MED ORDER — OXYCODONE-ACETAMINOPHEN 5-325 MG PO TABS: 1.0000 | ORAL_TABLET | Freq: Four times a day (QID) | ORAL | 0 refills | Status: DC | PRN

## 2018-04-22 NOTE — Telephone Encounter (Signed)
Patients daughter called in to cancel her appointments for any treatment.  I sent Dr Burr Medico and in basket message regarding this also per 5/14 phone message

## 2018-04-22 NOTE — Telephone Encounter (Signed)
Received call from someone stating that Maria Washington is cancelling appt today for radiation.  Notified [facility] area & they will contact patient.

## 2018-04-23 ENCOUNTER — Telehealth: Payer: Self-pay | Admitting: Radiation Oncology

## 2018-04-23 NOTE — Progress Notes (Signed)
  Radiation Oncology         [id number]) [phone] ________________________________  Name: Maria Washington MRN: [id number]  Date: 04/21/2018  DOB: [birth year]  End of Treatment Note  Diagnosis:   84 y.o. female with hemoptysis from recurrent lung cancer    Indication for treatment:  Palliative       Radiation treatment dates:   04/09/2018 - 04/21/2018  Site/Planned dose:   Right Bronchus / 30 Gy in 10 fractions. The patient only received 27 Gy due to discontinuing treatment after the 9th fraction.  Beams/energy:   3D / 6X, 10X Photon  Narrative: The patient tolerated radiation treatment relatively well.  She experienced mild fatigue and some esophagitis with occasional dysphasia, managed with Carafate. She reports improved hemoptysis; decreased frequency of cough and less productive of rust-colored sputum. No hyperpigmentation or desquamation noted in the treatment field but she did have some dryness/itching. She also reported intermittent right-sided chest, back, and shoulder pain. She has decided to discontinue treatment secondary to systemic disease progression despite chemotherapy.  Plan: The patient has completed radiation treatment. The patient will return to radiation oncology clinic for follow-up on an as-needed basis. I advised her to call or return if she has any questions or concerns related to her recovery or treatment. ________________________________  Sheral Apley. Tammi Klippel, M.D.  This document serves as a record of services personally performed by Tyler Pita, MD. It was created on his behalf by Rae Lips, a trained medical scribe. The creation of this record is based on the scribe's personal observations and the provider's statements to them. This document has been checked and approved by the attending provider.

## 2018-04-23 NOTE — Telephone Encounter (Signed)
Received voicemail message from [place] wanting to check on patient's status. Phoned her back. Explained the patient took her last treatment on 04/20/2018 the same day her pacemaker was checked by [employer]. In addition explained the patient decided not to take her last treatment on 04/21/2018. Tomi Bamberger verbalized understanding and confirmed she delivered the patient's 04/20/2018 pacemaker reading to Dr. Thurman Coyer office.

## 2018-04-24 ENCOUNTER — Telehealth: Payer: Self-pay | Admitting: *Deleted

## 2018-04-24 NOTE — Telephone Encounter (Signed)
Returned call to Lucita Ferrara ( daughter) informed Lucita Ferrara pt pain medication did not go to pharmacy, she will need to pick up Rx at [facility]. Lois verbalized understanding. No further concerns.

## 2018-04-27 ENCOUNTER — Telehealth: Payer: Self-pay | Admitting: *Deleted

## 2018-04-27 NOTE — Telephone Encounter (Signed)
Patient's daughter [name] here for prescription pick-up asking "what is mom to do in reference to the port-a-cath.  Not receiving any more chemotherapy.  Port-a-cath has been flushed every six weeks.  Last flushed on Apr 21, 2018.  Call [phone].   If all they need to do is schedule, she can come in any day or time."   Information routed to collaborative for best help with this request.

## 2018-04-27 NOTE — Telephone Encounter (Signed)
Did she decide not to take any further chemotherapy?Marland Kitchen  She was supposed to start new chemotherapy regimen.

## 2018-05-05 ENCOUNTER — Telehealth: Payer: Self-pay | Admitting: Medical Oncology

## 2018-05-05 NOTE — Telephone Encounter (Signed)
Pt coming in wed re treatment.

## 2018-05-06 ENCOUNTER — Inpatient Hospital Stay: Payer: Medicare Other

## 2018-05-06 ENCOUNTER — Telehealth: Payer: Self-pay | Admitting: Internal Medicine

## 2018-05-06 ENCOUNTER — Inpatient Hospital Stay (HOSPITAL_BASED_OUTPATIENT_CLINIC_OR_DEPARTMENT_OTHER): Payer: Medicare Other | Admitting: Nurse Practitioner

## 2018-05-06 ENCOUNTER — Other Ambulatory Visit: Payer: Self-pay | Admitting: Nurse Practitioner

## 2018-05-06 ENCOUNTER — Encounter: Payer: Self-pay | Admitting: Nurse Practitioner

## 2018-05-06 DIAGNOSIS — G620 Drug-induced polyneuropathy: Secondary | ICD-10-CM | POA: Diagnosis not present

## 2018-05-06 DIAGNOSIS — C3491 Malignant neoplasm of unspecified part of right bronchus or lung: Secondary | ICD-10-CM

## 2018-05-06 DIAGNOSIS — Z95828 Presence of other vascular implants and grafts: Secondary | ICD-10-CM

## 2018-05-06 DIAGNOSIS — C3411 Malignant neoplasm of upper lobe, right bronchus or lung: Secondary | ICD-10-CM | POA: Diagnosis not present

## 2018-05-06 DIAGNOSIS — C3490 Malignant neoplasm of unspecified part of unspecified bronchus or lung: Secondary | ICD-10-CM

## 2018-05-06 DIAGNOSIS — R5382 Chronic fatigue, unspecified: Secondary | ICD-10-CM

## 2018-05-06 DIAGNOSIS — Z5111 Encounter for antineoplastic chemotherapy: Secondary | ICD-10-CM | POA: Diagnosis not present

## 2018-05-06 LAB — CMP (CANCER CENTER ONLY)
ALBUMIN: 3.1 g/dL — AB (ref 3.5–5.0)
ALT: 14 U/L (ref 0–55)
ANION GAP: 11 (ref 3–11)
AST: 18 U/L (ref 5–34)
Alkaline Phosphatase: 62 U/L (ref 40–150)
BILIRUBIN TOTAL: 0.3 mg/dL (ref 0.2–1.2)
BUN: 11 mg/dL (ref 7–26)
CO2: 24 mmol/L (ref 22–29)
Calcium: 9.4 mg/dL (ref 8.4–10.4)
Chloride: 106 mmol/L (ref 98–109)
Creatinine: 0.78 mg/dL (ref 0.60–1.10)
GFR, Est AFR Am: >60 mL/min (ref >60)
GFR, Estimated: >60 mL/min (ref >60)
GLUCOSE: 92 mg/dL (ref 70–140)
POTASSIUM: 3.7 mmol/L (ref 3.5–5.1)
Sodium: 141 mmol/L (ref 136–145)
TOTAL PROTEIN: 6.9 g/dL (ref 6.4–8.3)

## 2018-05-06 LAB — CBC WITH DIFFERENTIAL (CANCER CENTER ONLY)
BASOS ABS: 0 10*3/uL (ref 0.0–0.1)
Basophils Relative: 1 %
Eosinophils Absolute: 0.2 10*3/uL (ref 0.0–0.5)
Eosinophils Relative: 5 %
HEMATOCRIT: 33.5 % — AB (ref 34.8–46.6)
Hemoglobin: 10.5 g/dL — ABNORMAL LOW (ref 11.6–15.9)
LYMPHS PCT: 16 %
Lymphs Abs: 0.9 10*3/uL (ref 0.9–3.3)
MCH: 28.5 pg (ref 25.1–34.0)
MCHC: 31.3 g/dL — ABNORMAL LOW (ref 31.5–36.0)
MCV: 91 fL (ref 79.5–101.0)
MONO ABS: 0.9 10*3/uL (ref 0.1–0.9)
Monocytes Relative: 17 %
NEUTROS ABS: 3.3 10*3/uL (ref 1.5–6.5)
Neutrophils Relative %: 61 %
Platelet Count: 156 10*3/uL (ref 145–400)
RBC: 3.68 MIL/uL — AB (ref 3.70–5.45)
RDW: 18.1 % — ABNORMAL HIGH (ref 11.2–14.5)
WBC: 5.3 10*3/uL (ref 3.9–10.3)

## 2018-05-06 LAB — TSH: TSH: 0.116 u[IU]/mL — AB (ref 0.308–3.960)

## 2018-05-06 MED ORDER — HEPARIN SOD (PORK) LOCK FLUSH 100 UNIT/ML IV SOLN
500.0000 [IU] | Freq: Once | INTRAVENOUS | Status: AC | PRN
  Administered 2018-05-06: 500 [IU]
  Filled 2018-05-06: qty 5

## 2018-05-06 MED ORDER — SODIUM CHLORIDE 0.9 % IV SOLN
Freq: Once | INTRAVENOUS | Status: AC
Start: 2018-05-06 — End: 2018-05-06
  Administered 2018-05-06: 15:00:00 via INTRAVENOUS

## 2018-05-06 MED ORDER — PROCHLORPERAZINE MALEATE 5 MG PO TABS: 5.0000 mg | ORAL_TABLET | Freq: Four times a day (QID) | ORAL | 0 refills | Status: AC | PRN

## 2018-05-06 MED ORDER — OXYCODONE-ACETAMINOPHEN 5-325 MG PO TABS: 1.0000 | ORAL_TABLET | Freq: Four times a day (QID) | ORAL | 0 refills | Status: DC | PRN

## 2018-05-06 MED ORDER — PROCHLORPERAZINE MALEATE 10 MG PO TABS
ORAL_TABLET | ORAL | Status: AC
  Filled 2018-05-06: qty 1

## 2018-05-06 MED ORDER — GEMCITABINE HCL CHEMO INJECTION 1 GM/26.3ML
1000.0000 mg/m2 | Freq: Once | INTRAVENOUS | Status: AC
  Administered 2018-05-06: 1672 mg via INTRAVENOUS
  Filled 2018-05-06: qty 43.97

## 2018-05-06 MED ORDER — SODIUM CHLORIDE 0.9% FLUSH
10.0000 mL | INTRAVENOUS | Status: DC | PRN
  Administered 2018-05-06: 10 mL
  Filled 2018-05-06: qty 10

## 2018-05-06 MED ORDER — SODIUM CHLORIDE 0.9% FLUSH
10.0000 mL | Freq: Once | INTRAVENOUS | Status: AC
  Administered 2018-05-06: 10 mL
  Filled 2018-05-06: qty 10

## 2018-05-06 MED ORDER — PROCHLORPERAZINE MALEATE 10 MG PO TABS
10.0000 mg | ORAL_TABLET | Freq: Once | ORAL | Status: AC
  Administered 2018-05-06: 10 mg via ORAL

## 2018-05-06 NOTE — Progress Notes (Signed)
Pharmacy notified that patient does not have any antiemetic at home according to medication list.  Notified Ned Card, NP - antiemetic sent to pharmacy.  Patient aware and voiced understanding.

## 2018-05-06 NOTE — Patient Instructions (Signed)
[facility] Discharge Instructions for Patients Receiving Chemotherapy  Today you received the following chemotherapy agents:  Gemzar.  To help prevent nausea and vomiting after your treatment, we encourage you to take your nausea medication as directed.   If you develop nausea and vomiting that is not controlled by your nausea medication, call the clinic.   BELOW ARE SYMPTOMS THAT SHOULD BE REPORTED IMMEDIATELY:  *FEVER GREATER THAN 100.5 F  *CHILLS WITH OR WITHOUT FEVER  NAUSEA AND VOMITING THAT IS NOT CONTROLLED WITH YOUR NAUSEA MEDICATION  *UNUSUAL SHORTNESS OF BREATH  *UNUSUAL BRUISING OR BLEEDING  TENDERNESS IN MOUTH AND THROAT WITH OR WITHOUT PRESENCE OF ULCERS  *URINARY PROBLEMS  *BOWEL PROBLEMS  UNUSUAL RASH Items with * indicate a potential emergency and should be followed up as soon as possible.  Feel free to call the clinic should you have any questions or concerns. The clinic phone number is [phone].  Please show the CHEMO ALERT CARD at check-in to the Emergency Department and triage nurse.  Gemcitabine injection What is this medicine? GEMCITABINE (jem SIT a been) is a chemotherapy drug. This medicine is used to treat many types of cancer like breast cancer, lung cancer, pancreatic cancer, and ovarian cancer. This medicine may be used for other purposes; ask your health care provider or pharmacist if you have questions. COMMON BRAND NAME(S): Gemzar What should I tell my health care provider before I take this medicine? They need to know if you have any of these conditions: -blood disorders -infection -kidney disease -liver disease -recent or ongoing radiation therapy -an unusual or allergic reaction to gemcitabine, other chemotherapy, other medicines, foods, dyes, or preservatives -pregnant or trying to get pregnant -breast-feeding How should I use this medicine? This drug is given as an infusion into a vein. It is administered in a  hospital or clinic by a specially trained health care professional. Talk to your pediatrician regarding the use of this medicine in children. Special care may be needed. Overdosage: If you think you have taken too much of this medicine contact a poison control center or emergency room at once. NOTE: This medicine is only for you. Do not share this medicine with others. What if I miss a dose? It is important not to miss your dose. Call your doctor or health care professional if you are unable to keep an appointment. What may interact with this medicine? -medicines to increase blood counts like filgrastim, pegfilgrastim, sargramostim -some other chemotherapy drugs like cisplatin -vaccines Talk to your doctor or health care professional before taking any of these medicines: -acetaminophen -aspirin -ibuprofen -ketoprofen -naproxen This list may not describe all possible interactions. Give your health care provider a list of all the medicines, herbs, non-prescription drugs, or dietary supplements you use. Also tell them if you smoke, drink alcohol, or use illegal drugs. Some items may interact with your medicine. What should I watch for while using this medicine? Visit your doctor for checks on your progress. This drug may make you feel generally unwell. This is not uncommon, as chemotherapy can affect healthy cells as well as cancer cells. Report any side effects. Continue your course of treatment even though you feel ill unless your doctor tells you to stop. In some cases, you may be given additional medicines to help with side effects. Follow all directions for their use. Call your doctor or health care professional for advice if you get a fever, chills or sore throat, or other symptoms of a cold   or flu. Do not treat yourself. This drug decreases your body's ability to fight infections. Try to avoid being around people who are sick. This medicine may increase your risk to bruise or bleed. Call  your doctor or health care professional if you notice any unusual bleeding. Be careful brushing and flossing your teeth or using a toothpick because you may get an infection or bleed more easily. If you have any dental work done, tell your dentist you are receiving this medicine. Avoid taking products that contain aspirin, acetaminophen, ibuprofen, naproxen, or ketoprofen unless instructed by your doctor. These medicines may hide a fever. Women should inform their doctor if they wish to become pregnant or think they might be pregnant. There is a potential for serious side effects to an unborn child. Talk to your health care professional or pharmacist for more information. Do not breast-feed an infant while taking this medicine. What side effects may I notice from receiving this medicine? Side effects that you should report to your doctor or health care professional as soon as possible: -allergic reactions like skin rash, itching or hives, swelling of the face, lips, or tongue -low blood counts - this medicine may decrease the number of white blood cells, red blood cells and platelets. You may be at increased risk for infections and bleeding. -signs of infection - fever or chills, cough, sore throat, pain or difficulty passing urine -signs of decreased platelets or bleeding - bruising, pinpoint red spots on the skin, black, tarry stools, blood in the urine -signs of decreased red blood cells - unusually weak or tired, fainting spells, lightheadedness -breathing problems -chest pain -mouth sores -nausea and vomiting -pain, swelling, redness at site where injected -pain, tingling, numbness in the hands or feet -stomach pain -swelling of ankles, feet, hands -unusual bleeding Side effects that usually do not require medical attention (report to your doctor or health care professional if they continue or are bothersome): -constipation -diarrhea -hair loss -loss of appetite -stomach upset This  list may not describe all possible side effects. Call your doctor for medical advice about side effects. You may report side effects to FDA at 1-800-FDA-1088. Where should I keep my medicine? This drug is given in a hospital or clinic and will not be stored at home. NOTE: This sheet is a summary. It may not cover all possible information. If you have questions about this medicine, talk to your doctor, pharmacist, or health care provider.  2018 Elsevier/Gold Standard (2008-04-05 18:45:54)    

## 2018-05-06 NOTE — Progress Notes (Addendum)
[facility] OFFICE PROGRESS NOTE   DIAGNOSIS: Stage IV (T2b, N3, M1 a) non-small cell lung cancer, squamous cell carcinoma presented with large right upper lobe lung mass in addition to bilateral mediastinal lymphadenopathy as well as left upper lobe lung nodule diagnosed in March 2018. PDL 1 expression is 5%.   PRIOR THERAPY:  1) Systemic chemotherapy with carboplatin for AUC of 5 and paclitaxel 175 MG/M2 every 3 weeks. First dose 03/24/2017. Status post 5 cycles. Last cycle was given 06/18/2017. Discontinued secondary to intolerance and significant peripheral neuropathy. 2) Second line treatment with immunotherapy with Keytruda 200 mg IV every 3 weeks.  First dose February 17, 2018.  Status post 3 cycles. 3) palliative radiotherapy to the large right upper lobe lung mass.  CURRENT THERAPY: Systemic chemotherapy with gemcitabine 1000 mg/M2 on days 1 and 8 every 3 weeks. First dose May 06, 2018.   INTERVAL HISTORY:   Maria Washington returns as scheduled.  She has stable dyspnea.  Cough is better.  No hemoptysis.  She intermittently expectorates phlegm.  She reports a good appetite.  No bowel or bladder problems.  Objective:  Vital signs in last 24 hours:  Blood pressure 120/76, pulse 76, temperature 98.4 F (36.9 C), temperature source Oral, resp. rate 18, height 5' (1.524 m), weight 145 lb 9.6 oz (66 kg), SpO2 99 %.    HEENT: No thrush or ulcers. Resp: Expiratory rhonchi bilaterally. Cardio: Regular rate and rhythm. GI: Abdomen soft and nontender.  No hepatomegaly. Vascular: No leg edema. Neuro: Alert and oriented. Port-A-Cath without erythema.  Lab Results:  Lab Results  Component Value Date   WBC 5.3 05/06/2018   HGB 10.5 (L) 05/06/2018   HCT 33.5 (L) 05/06/2018   MCV 91.0 05/06/2018   PLT 156 05/06/2018   NEUTROABS 3.3 05/06/2018    Imaging:  No results found.  Medications: I have reviewed the patient's current medications.  Assessment/Plan: 1. Stage  IV non-small cell lung cancer, adenocarcinoma, with PDL 1 expression of 5%.  Treated in the past with carboplatin/Taxol and most recently Pembrolizumab with disease progression.  Scheduled to begin treatment with gemcitabine today on a day 1 and day 8 schedule every 3 weeks.    Disposition: Maria Washington appears stable.  Plan to proceed with cycle 1 day 1 gemcitabine today as scheduled.  We again reviewed potential toxicities.  She will return in 1 week for the day 8 treatment.  She will return for lab follow-up and cycle 2 chemotherapy in 3 weeks.  She will contact the office in the interim with any problems.  Patient seen with Maria Washington.    Maria Washington ANP/GNP-BC   05/06/2018  2:22 PM   ADDENDUM: Hematology/Oncology Attending: I had a face-to-face encounter with the patient today.  I recommended her care plan.  This is a very pleasant 84 years old white female with metastatic non-small cell lung cancer, squamous cell carcinoma status post 6 cycles of systemic chemotherapy with carboplatin and paclitaxel with partial response.  The patient then developed disease progression and was a started on treatment with Keytruda discontinued secondary to disease progression.  She also received palliative radiotherapy to the right upper lobe lung mass after she presented with hemoptysis. Her recent imaging studies showed evidence for disease progression and the patient was considered for treatment with single agent gemcitabine.  She was reluctant about this treatment and her treatment was delayed until today.  I had a lengthy discussion with the patient and her daughters today  about her current condition and treatment options.  I addressed her concern regarding the adverse effect of this treatment and the patient would like to proceed with the treatment as previously planned and she is expected to start the first dose of her treatment today.  She will come back for follow-up visit in 3 weeks for evaluation with  the start of cycle #2. The patient was advised to call immediately if she has any concerning symptoms in the interval.  Disclaimer: This note was dictated with voice recognition software. Similar sounding words can inadvertently be transcribed and may be missed upon review. Maria Kempf, MD 05/06/18

## 2018-05-06 NOTE — Telephone Encounter (Signed)
Appointments scheduled AVS/Calendar printed per 5/29 los

## 2018-05-12 ENCOUNTER — Ambulatory Visit: Payer: Medicare Other | Admitting: Internal Medicine

## 2018-05-12 ENCOUNTER — Ambulatory Visit: Payer: Medicare Other

## 2018-05-12 ENCOUNTER — Other Ambulatory Visit: Payer: Medicare Other

## 2018-05-13 ENCOUNTER — Inpatient Hospital Stay: Payer: Medicare Other

## 2018-05-13 ENCOUNTER — Inpatient Hospital Stay: Payer: Medicare Other | Attending: Internal Medicine

## 2018-05-13 VITALS — BP 129/72 | HR 75 | Temp 98.3°F | Resp 18

## 2018-05-13 DIAGNOSIS — Z5111 Encounter for antineoplastic chemotherapy: Secondary | ICD-10-CM | POA: Insufficient documentation

## 2018-05-13 DIAGNOSIS — C3411 Malignant neoplasm of upper lobe, right bronchus or lung: Secondary | ICD-10-CM | POA: Insufficient documentation

## 2018-05-13 DIAGNOSIS — R634 Abnormal weight loss: Secondary | ICD-10-CM | POA: Diagnosis not present

## 2018-05-13 DIAGNOSIS — C3491 Malignant neoplasm of unspecified part of right bronchus or lung: Secondary | ICD-10-CM

## 2018-05-13 DIAGNOSIS — Z95828 Presence of other vascular implants and grafts: Secondary | ICD-10-CM

## 2018-05-13 DIAGNOSIS — Z452 Encounter for adjustment and management of vascular access device: Secondary | ICD-10-CM | POA: Insufficient documentation

## 2018-05-13 LAB — CMP (CANCER CENTER ONLY)
ALK PHOS: 66 U/L (ref 40–150)
ALT: 26 U/L (ref 0–55)
AST: 25 U/L (ref 5–34)
Albumin: 3.1 g/dL — ABNORMAL LOW (ref 3.5–5.0)
Anion gap: 9 (ref 3–11)
BUN: 11 mg/dL (ref 7–26)
CALCIUM: 9.5 mg/dL (ref 8.4–10.4)
CO2: 26 mmol/L (ref 22–29)
Chloride: 105 mmol/L (ref 98–109)
Creatinine: 0.9 mg/dL (ref 0.60–1.10)
GFR, Est AFR Am: >60 mL/min (ref >60)
GFR, Estimated: 56 mL/min — ABNORMAL LOW (ref >60)
GLUCOSE: 103 mg/dL (ref 70–140)
Potassium: 3.8 mmol/L (ref 3.5–5.1)
SODIUM: 140 mmol/L (ref 136–145)
Total Bilirubin: 0.3 mg/dL (ref 0.2–1.2)
Total Protein: 7.1 g/dL (ref 6.4–8.3)

## 2018-05-13 LAB — CBC WITH DIFFERENTIAL (CANCER CENTER ONLY)
BASOS ABS: 0 10*3/uL (ref 0.0–0.1)
Basophils Relative: 1 %
EOS ABS: 0.1 10*3/uL (ref 0.0–0.5)
Eosinophils Relative: 2 %
HCT: 30.5 % — ABNORMAL LOW (ref 34.8–46.6)
HEMOGLOBIN: 9.9 g/dL — AB (ref 11.6–15.9)
LYMPHS ABS: 0.5 10*3/uL — AB (ref 0.9–3.3)
Lymphocytes Relative: 23 %
MCH: 29.5 pg (ref 25.1–34.0)
MCHC: 32.5 g/dL (ref 31.5–36.0)
MCV: 90.8 fL (ref 79.5–101.0)
Monocytes Absolute: 0.4 10*3/uL (ref 0.1–0.9)
Monocytes Relative: 19 %
NEUTROS PCT: 55 %
Neutro Abs: 1.2 10*3/uL — ABNORMAL LOW (ref 1.5–6.5)
Platelet Count: 82 10*3/uL — ABNORMAL LOW (ref 145–400)
RBC: 3.36 MIL/uL — AB (ref 3.70–5.45)
RDW: 16.7 % — ABNORMAL HIGH (ref 11.2–14.5)
WBC: 2.2 10*3/uL — AB (ref 3.9–10.3)

## 2018-05-13 MED ORDER — SODIUM CHLORIDE 0.9 % IV SOLN
Freq: Once | INTRAVENOUS | Status: AC
  Administered 2018-05-13: 15:00:00 via INTRAVENOUS

## 2018-05-13 MED ORDER — SODIUM CHLORIDE 0.9 % IV SOLN
1000.0000 mg/m2 | Freq: Once | INTRAVENOUS | Status: AC
  Administered 2018-05-13: 1672 mg via INTRAVENOUS
  Filled 2018-05-13: qty 43.97

## 2018-05-13 MED ORDER — HEPARIN SOD (PORK) LOCK FLUSH 100 UNIT/ML IV SOLN
500.0000 [IU] | Freq: Once | INTRAVENOUS | Status: AC | PRN
  Administered 2018-05-13: 500 [IU]
  Filled 2018-05-13: qty 5

## 2018-05-13 MED ORDER — SODIUM CHLORIDE 0.9% FLUSH
10.0000 mL | Freq: Once | INTRAVENOUS | Status: AC
  Administered 2018-05-13: 10 mL
  Filled 2018-05-13: qty 10

## 2018-05-13 MED ORDER — PROCHLORPERAZINE MALEATE 10 MG PO TABS
10.0000 mg | ORAL_TABLET | Freq: Once | ORAL | Status: AC
  Administered 2018-05-13: 10 mg via ORAL

## 2018-05-13 MED ORDER — PROCHLORPERAZINE MALEATE 10 MG PO TABS
ORAL_TABLET | ORAL | Status: AC
Start: 2018-05-13 — End: ?
  Filled 2018-05-13: qty 1

## 2018-05-13 MED ORDER — SODIUM CHLORIDE 0.9% FLUSH
10.0000 mL | INTRAVENOUS | Status: DC | PRN
  Administered 2018-05-13: 10 mL
  Filled 2018-05-13: qty 10

## 2018-05-13 NOTE — Progress Notes (Signed)
OK to treat with platelets and ANC today per MD [facility]

## 2018-05-13 NOTE — Patient Instructions (Addendum)
[facility] Discharge Instructions for Patients Receiving Chemotherapy  Today you received the following chemotherapy agents Gemzar  To help prevent nausea and vomiting after your treatment, we encourage you to take your nausea medication as directed.    If you develop nausea and vomiting that is not controlled by your nausea medication, call the clinic.   BELOW ARE SYMPTOMS THAT SHOULD BE REPORTED IMMEDIATELY:  *FEVER GREATER THAN 100.5 F  *CHILLS WITH OR WITHOUT FEVER  NAUSEA AND VOMITING THAT IS NOT CONTROLLED WITH YOUR NAUSEA MEDICATION  *UNUSUAL SHORTNESS OF BREATH  *UNUSUAL BRUISING OR BLEEDING  TENDERNESS IN MOUTH AND THROAT WITH OR WITHOUT PRESENCE OF ULCERS  *URINARY PROBLEMS  *BOWEL PROBLEMS  UNUSUAL RASH Items with * indicate a potential emergency and should be followed up as soon as possible.  Feel free to call the clinic should you have any questions or concerns. The clinic phone number is [phone].  Please show the [place] at check-in to the Emergency Department and triage nurse.   Neutropenia Neutropenia is a condition that occurs when you have a lower-than-normal level of a type of white blood cell (neutrophil) in your body. Neutrophils are made in the spongy center of large bones (bone marrow) and they fight infections. Neutrophils are your body's main defense against bacterial and fungal infections. The fewer neutrophils you have and the longer your body remains without them, the greater your risk of getting a severe infection. What are the causes? This condition can occur if your body uses up or destroys neutrophils faster than your bone marrow can make them. This problem may happen because of: Bacterial or fungal infection. Allergic disorders. Reactions to some medicines. Autoimmune disease. An enlarged spleen.  This condition can also occur if your bone marrow does not produce enough neutrophils. This problem may be  caused by: Cancer. Cancer treatments, such as radiation or chemotherapy. Viral infections. Medicines, such as phenytoin. Vitamin B12 deficiency. Diseases of the bone marrow. Environmental toxins, such as insecticides.  What are the signs or symptoms? This condition does not usually cause symptoms. If symptoms are present, they are usually caused by an underlying infection. Symptoms of an infection may include: Fever. Chills. Swollen glands. Oral or anal ulcers. Cough and shortness of breath. Rash. Skin infection. Fatigue.  How is this diagnosed? Your health care provider may suspect neutropenia if you have: A condition that may cause neutropenia. Symptoms of infection, especially fever. Frequent and unusual infections.  You will have a medical history and physical exam. Tests will also be done, such as: A complete blood count (CBC). A procedure to collect a sample of bone marrow for examination (bone marrow biopsy). A chest X-ray. A urine culture. A blood culture.  How is this treated? Treatment depends on the underlying cause and severity of your condition. Mild neutropenia may not require treatment. Treatment may include medicines, such as: Antibiotic medicine given through an IV tube. Antiviral medicines. Antifungal medicines. A medicine to increase neutrophil production (colony-stimulating factor). You may get this drug through an IV tube or by injection. Steroids given through an IV tube.  If an underlying condition is causing neutropenia, you may need treatment for that condition. If medicines you are taking are causing neutropenia, your health care provider may have you stop taking those medicines. Follow these instructions at home: Medicines Take over-the-counter and prescription medicines only as told by your health care provider. Get a seasonal flu shot (influenza vaccine). Lifestyle Do not eat unpasteurized  foods.Do not eat unwashed raw fruits or  vegetables. Avoid exposure to groups of people or children. Avoid being around people who are sick. Avoid being around dirt or dust, such as in construction areas or gardens. Do not provide direct care for pets. Avoid animal droppings. Do not clean litter boxes and bird cages. Hygiene  Bathe daily. Clean the area between the genitals and the anus (perineal area) after you urinate or have a bowel movement. If you are female, wipe from front to back. Brush your teeth with a soft toothbrush before and after meals. Do not use a razor that has a blade. Use an electric razor to remove hair. Wash your hands often. Make sure others who come in contact with you also wash their hands. If soap and water are not available, use hand sanitizer. General instructions Do not have sex unless your health care provider has approved. Take actions to avoid cuts and burns. For example: Be cautious when you use knives. Always cut away from yourself. Keep knives in protective sheaths or guards when not in use. Use oven mitts when you cook with a hot stove, oven, or grill. Stand a safe distance away from open fires. Avoid people who received a vaccine in the past 30 days if that vaccine contained a live version of the germ (live vaccine). You should not get a live vaccine. Common live vaccines are varicella, measles, mumps, and rubella. Do not share food utensils. Do not use tampons, enemas, or rectal suppositories unless your health care provider has approved. Keep all appointments as told by your health care provider. This is important. Contact a health care provider if: You have a fever. You have chills or you start to shake. You have: A sore throat. A warm, red, or tender area on your skin. A cough. Frequent or painful urination. Vaginal discharge or itching. You develop: Sores in your mouth or anus. Swollen lymph nodes. Red streaks on the skin. A rash. You feel: Nauseous or you vomit. Very  fatigued. Short of breath. This information is not intended to replace advice given to you by your health care provider. Make sure you discuss any questions you have with your health care provider. Document Released: 05/17/2002 Document Revised: 05/02/2016 Document Reviewed: 06/07/2015 Elsevier Interactive Patient Education  [employer].

## 2018-05-14 ENCOUNTER — Telehealth: Payer: Self-pay | Admitting: Medical Oncology

## 2018-05-14 NOTE — Telephone Encounter (Signed)
"  Severe pain after chemo" . Percocet eases pain but she cannot sleep. I left message to return my call.

## 2018-05-14 NOTE — Telephone Encounter (Signed)
dtr called to report pt has pain from waist up to shoulder under arm and around to R breast. Taking percocet 2-3 day. I called back and left message with instructions for Lucita Ferrara that pt may have up to 4 percocet /day and to monitor for blisters if pain is all on one side . I instructed her to take pt to ED if pain uncontrolled.

## 2018-05-15 ENCOUNTER — Other Ambulatory Visit: Payer: Self-pay | Admitting: Internal Medicine

## 2018-05-15 ENCOUNTER — Other Ambulatory Visit: Payer: Self-pay | Admitting: Medical Oncology

## 2018-05-15 DIAGNOSIS — C3491 Malignant neoplasm of unspecified part of right bronchus or lung: Secondary | ICD-10-CM

## 2018-05-15 MED ORDER — OXYCODONE HCL 5 MG PO TABS: 5.0000 mg | ORAL_TABLET | ORAL | 0 refills | Status: DC | PRN

## 2018-05-15 NOTE — Telephone Encounter (Addendum)
I spoke to [place] , states pt pain a little better today. Per Julien Nordmann , change pain med to oxycodone 5 mg every 4 hours prn severe pain.Marland Kitchen  Percocet discontinued.Lois notified to pick up rx.

## 2018-05-20 ENCOUNTER — Other Ambulatory Visit: Payer: Self-pay | Admitting: Medical Oncology

## 2018-05-20 DIAGNOSIS — C3491 Malignant neoplasm of unspecified part of right bronchus or lung: Secondary | ICD-10-CM

## 2018-05-20 MED ORDER — OXYCODONE HCL 5 MG PO TABS: 5.0000 mg | ORAL_TABLET | ORAL | 0 refills | Status: DC | PRN

## 2018-05-20 NOTE — Progress Notes (Signed)
Pt  notified that rx will be ready tomorrow .

## 2018-05-21 ENCOUNTER — Telehealth: Payer: Self-pay | Admitting: *Deleted

## 2018-05-21 NOTE — Telephone Encounter (Signed)
FYI 12:20 pm: patient expressed "I will talk with doctor about increasing quantity.  I have to return every six days to pick up prescription.  No, I do not use the pills around the clock but four times a day which means twenty-eight of thirty are used within seven days."  Provided Oxy IR prescription upon receipt of patient NCDL number and signature recorded in pick up log.  Denies further needs or questions at this time.

## 2018-05-26 NOTE — Assessment & Plan Note (Addendum)
This is a very pleasant 84 year old white female with a stage IV non-small cell lung cancer, adenocarcinoma with PDL 1 expression of 5%. The patient underwent systemic chemotherapy with carboplatin and paclitaxel, status post 5 cycles and has been tolerating her treatment well except for increasing fatigue and weakness as well as development of peripheral neuropathy. Last dose of chemotherapy was in July 2018.   The patient was a started on treatment with Keytruda status post 3 cycles discontinued secondary to disease progression. She had repeat CT scan of the chest, abdomen and pelvis performed recently which showed further disease progression with increase in the size of the cavitary mass within the right upper lobe there was also interval increase in the size of the left prevascular lymphadenopathy and a new nodule identified within the left upper lobe.  There was no evidence of metastatic disease to the abdomen or pelvis. The patient is now on second line chemotherapy with single agent gemcitabine 1000 mg/M2 on days 1 and 8 every 3 weeks.    Status post 1 cycle of treatment which she tolerated fairly well with the exception of fatigue, bruising, and weight loss. Labs reviewed today.  Recommend for her to proceed with day 1 of cycle 2 today as scheduled. She will return next week for labs and day 8 of her chemotherapy.  For weight loss, I have recommended that she drink 2 to 3 cans of boost or Ensure per day.  I will also refer her to our dietitian.  The patient will follow-up in 3 weeks for evaluation prior to cycle 3 of her chemotherapy.  The patient was advised to call immediately if she has any concerning symptoms in the interval. The patient voices understanding of current disease status and treatment options and is in agreement with the current care plan. All questions were answered. The patient knows to call the clinic with any problems, questions or concerns. We can certainly see the  patient much sooner if necessary.

## 2018-05-26 NOTE — Progress Notes (Signed)
[facility] OFFICE PROGRESS NOTE  Lajean Manes, MD [address] [place] [address]  DIAGNOSIS: Stage IV (T2b, N3, M1 a) non-small cell lung cancer, squamous cell carcinoma presented with large right upper lobe lung mass in addition to bilateral mediastinal lymphadenopathy as well as left upper lobe lung nodule diagnosed in March 2018. PDL 1 expression is 5%.   PRIOR THERAPY: 1) Systemic chemotherapy with carboplatin for AUC of 5 and paclitaxel 175 MG/M2 every 3 weeks. First dose 03/24/2017. Status post 5 cycles. Last cycle was given 06/18/2017. Discontinued secondary to intolerance and significant peripheral neuropathy. 2) Second line treatment with immunotherapy with Keytruda 200 mg IV every 3 weeks.  First dose February 17, 2018.  Status post 3 cycles. 3) palliative radiotherapy to the large right upper lobe lung mass.  CURRENT THERAPY: Systemic chemotherapy with gemcitabine 1000 mg/M2 on days 1 and 8 every 3 weeks.  First dose May 06, 2018.  Status post 1 cycle.  INTERVAL HISTORY: Maria Washington 84 y.o. female returns for routine follow-up visit accompanied by her daughter.  The patient is feeling fine today and has no specific complaints of her fatigue.  She overall tolerated her first cycle of chemotherapy fairly well.  She did note increased bruising following day 8 of her treatment.  She denied having any bleeding gums, epistaxis, hematuria or blood in her stool.  She denies fevers and chills.  Denies chest pain, shortness of breath, cough, hemoptysis.  Denies nausea, vomiting, constipation, diarrhea.  She reports intermittent back pain and takes oxycodone as needed.  She reports that she takes it 2 to 3 tablets/day.  Oxycodone is effective.  She reports that her appetite is fair and she has lost a few pounds since her last visit.  The patient is here for evaluation prior to starting cycle 2 of her chemotherapy.  MEDICAL HISTORY: Past Medical History:   Diagnosis Date  . Aortic atherosclerosis ([place]) 02/10/2017  . Arthritis   . CAD (coronary artery disease), native coronary artery    3 vessel calcification noted on CT scan   . Cardiac pacemaker in situ 12/05/2011  . Chronic diastolic congestive heart failure ([place])   . COPD (chronic obstructive pulmonary disease) ([facility])    spot on lung  . Dehydration 06/04/2017  . Encounter for antineoplastic chemotherapy 03/04/2017  . Goals of care, counseling/discussion 03/17/2017  . History of breast cancer    Treated with lumpectomy, radiation, and chemo sees Dr. Ralene Ok   . Hypertensive heart disease without CHF   . Hypokalemia 04/18/2017  . Hypothyroid   . Mitral regurgitation   . Paroxysmal atrial fibrillation ([facility])    CHA2DS2VASC score 5  . Personal history of chemotherapy   . Personal history of radiation therapy   . Second degree heart block   . Stage IV squamous cell carcinoma of right lung ([place]) 03/04/2017    ALLERGIES:  is allergic to clindamycin/lincomycin and penicillins.  MEDICATIONS:  Current Outpatient Medications  Medication Sig Dispense Refill  . alendronate (FOSAMAX) 70 MG tablet Take 70 mg by mouth every Monday.     . calcium-vitamin D (OSCAL WITH D) 500-200 MG-UNIT per tablet Take 1 tablet by mouth 2 (two) times daily.      . cholecalciferol (VITAMIN D) 1000 units tablet Take 1,000 Units by mouth daily.     Marland Kitchen diltiazem (CARDIZEM CD) 120 MG 24 hr capsule Take 120 mg by mouth daily.  3  . diphenoxylate-atropine (LOMOTIL) 2.5-0.025 MG tablet  Take 1 tablet by mouth 4 (four) times daily as needed for diarrhea or loose stools. May take qid if imodium is not working. 30 tablet 0  . ELIQUIS 2.5 MG TABS tablet Take 2.5 mg by mouth 2 (two) times daily.  3  . folic acid (FOLVITE) 1 MG tablet Take 1 mg by mouth daily.      . furosemide (LASIX) 40 MG tablet Take 40 mg by mouth daily.     Marland Kitchen gabapentin (NEURONTIN) 300 MG capsule Take 1 capsule (300 mg total) by mouth as directed. Take 2  capsules in am ( total 600 mg) , one capsule (300 mg)  in the afternoon and 2 capsules (600 mg) hs 450 capsule 0  . levothyroxine (SYNTHROID, LEVOTHROID) 137 MCG tablet Take 137 mcg by mouth daily before breakfast.    . lidocaine-prilocaine (EMLA) cream Apply 1 application topically as needed. 30 g 1  . Omega-3 Fatty Acids (FISH OIL) 1200 MG CAPS Take 1,200 mg by mouth 2 (two) times daily.     Marland Kitchen oxyCODONE (OXY IR/ROXICODONE) 5 MG immediate release tablet Take 1 tablet (5 mg total) by mouth every 4 (four) hours as needed for severe pain. 30 tablet 0  . potassium chloride SA (K-DUR,KLOR-CON) 20 MEQ tablet Take 40 mEq by mouth 2 (two) times daily.     . prochlorperazine (COMPAZINE) 5 MG tablet Take 1 tablet (5 mg total) by mouth every 6 (six) hours as needed for nausea or vomiting. 30 tablet 0  . sucralfate (CARAFATE) 1 g tablet Take 1 tablet (1 g total) by mouth 4 (four) times daily -  with meals and at bedtime. Crush tablet and dissolve in 6 oz of water. 120 tablet 1  . vitamin B-12 (CYANOCOBALAMIN) 1000 MCG tablet Take 1,000 mcg by mouth daily.       No current facility-administered medications for this visit.     SURGICAL HISTORY:  Past Surgical History:  Procedure Laterality Date  . ABDOMINAL HYSTERECTOMY    . BLADDER SUSPENSION    . BREAST LUMPECTOMY Left    lumpectomy  . CARPAL TUNNEL RELEASE     left wrist  . CHOLECYSTECTOMY    . FINGER SURGERY    . IR FLUORO GUIDE PORT INSERTION RIGHT  03/12/2017  . IR US GUIDE VASC ACCESS RIGHT  03/12/2017  . PERMANENT PACEMAKER INSERTION N/A 12/04/2011   Procedure: PERMANENT PACEMAKER INSERTION;  Surgeon: Evans Lance, MD;  Location: [facility] CATH LAB;  Service: Cardiovascular;  Laterality: N/A;  . SHOULDER SURGERY    . TONSILLECTOMY AND ADENOIDECTOMY      REVIEW OF SYSTEMS:   Review of Systems  Constitutional: Negative for appetite change, chills, fever.  Positive for fatigue and weight loss.  HENT:   Negative for mouth sores, nosebleeds, sore  throat and trouble swallowing.   Eyes: Negative for eye problems and icterus.  Respiratory: Negative for cough, hemoptysis, shortness of breath and wheezing.   Cardiovascular: Negative for chest pain and leg swelling.  Gastrointestinal: Negative for abdominal pain, constipation, diarrhea, nausea and vomiting.  Genitourinary: Negative for bladder incontinence, difficulty urinating, dysuria, frequency and hematuria.   Musculoskeletal: Negative for neck pain and neck stiffness. Positive for back pain.  Uses oxycodone 2 to 3 tablets/day. Skin: Negative for itching and rash.  Neurological: Negative for dizziness, extremity weakness, headaches, light-headedness and seizures.  Hematological: Negative for adenopathy. Does not bruise/bleed easily.  Psychiatric/Behavioral: Negative for confusion, depression and sleep disturbance. The patient is not nervous/anxious.  PHYSICAL EXAMINATION:  Blood pressure (!) 115/56, pulse 71, temperature 97.9 F (36.6 C), temperature source Oral, resp. rate 18, height 5' (1.524 m), weight 139 lb 9.6 oz (63.3 kg), SpO2 99 %.  ECOG PERFORMANCE STATUS: 1 - Symptomatic but completely ambulatory  Physical Exam  Constitutional: Oriented to person, place, and time and well-developed, well-nourished, and in no distress. No distress.  HENT:  Head: Normocephalic and atraumatic.  Mouth/Throat: Oropharynx is clear and moist. No oropharyngeal exudate.  Eyes: Conjunctivae are normal. Right eye exhibits no discharge. Left eye exhibits no discharge. No scleral icterus.  Neck: Normal range of motion. Neck supple.  Cardiovascular: Normal rate, regular rhythm, normal heart sounds and intact distal pulses.   Pulmonary/Chest: Effort normal and breath sounds normal. No respiratory distress. No wheezes. No rales.  Abdominal: Soft. Bowel sounds are normal. Exhibits no distension and no mass. There is no tenderness.  Musculoskeletal: Normal range of motion. Exhibits no edema.   Lymphadenopathy:    No cervical adenopathy.  Neurological: Alert and oriented to person, place, and time. Exhibits normal muscle tone. Coordination normal.  Skin: Skin is warm and dry. No rash noted. Not diaphoretic. No erythema. No pallor.  Psychiatric: Mood, memory and judgment normal.  Vitals reviewed.  LABORATORY DATA: Lab Results  Component Value Date   WBC 4.7 05/27/2018   HGB 9.3 (L) 05/27/2018   HCT 29.3 (L) 05/27/2018   MCV 94.2 05/27/2018   PLT 261 05/27/2018      Chemistry      Component Value Date/Time   NA 140 05/13/2018 1324   NA 141 11/03/2017 0834   K 3.8 05/13/2018 1324   K 3.9 11/03/2017 0834   CL 105 05/13/2018 1324   CO2 26 05/13/2018 1324   CO2 23 11/03/2017 0834   BUN 11 05/13/2018 1324   BUN 10.1 11/03/2017 0834   CREATININE 0.90 05/13/2018 1324   CREATININE 0.8 11/03/2017 0834      Component Value Date/Time   CALCIUM 9.5 05/13/2018 1324   CALCIUM 9.6 11/03/2017 0834   ALKPHOS 66 05/13/2018 1324   ALKPHOS 61 11/03/2017 0834   AST 25 05/13/2018 1324   AST 13 11/03/2017 0834   ALT 26 05/13/2018 1324   ALT 11 11/03/2017 0834   BILITOT 0.3 05/13/2018 1324   BILITOT 0.31 11/03/2017 0834       RADIOGRAPHIC STUDIES:  No results found.   ASSESSMENT/PLAN:  Stage IV squamous cell carcinoma of right lung [facility]) This is a very pleasant 84 year old white female with a stage IV non-small cell lung cancer, adenocarcinoma with PDL 1 expression of 5%. The patient underwent systemic chemotherapy with carboplatin and paclitaxel, status post 5 cycles and has been tolerating her treatment well except for increasing fatigue and weakness as well as development of peripheral neuropathy. Last dose of chemotherapy was in July 2018.   The patient was a started on treatment with Keytruda status post 3 cycles discontinued secondary to disease progression. She had repeat CT scan of the chest, abdomen and pelvis performed recently which showed further disease  progression with increase in the size of the cavitary mass within the right upper lobe there was also interval increase in the size of the left prevascular lymphadenopathy and a new nodule identified within the left upper lobe.  There was no evidence of metastatic disease to the abdomen or pelvis. The patient is now on second line chemotherapy with single agent gemcitabine 1000 mg/M2 on days 1 and 8 every 3 weeks.  Status post 1 cycle of treatment which she tolerated fairly well with the exception of fatigue, bruising, and weight loss. Labs reviewed today.  Recommend for her to proceed with day 1 of cycle 2 today as scheduled. She will return next week for labs and day 8 of her chemotherapy.  For weight loss, I have recommended that she drink 2 to 3 cans of boost or Ensure per day.  I will also refer her to our dietitian.  The patient will follow-up in 3 weeks for evaluation prior to cycle 3 of her chemotherapy.  The patient was advised to call immediately if she has any concerning symptoms in the interval. The patient voices understanding of current disease status and treatment options and is in agreement with the current care plan. All questions were answered. The patient knows to call the clinic with any problems, questions or concerns. We can certainly see the patient much sooner if necessary.   Orders Placed This Encounter  Procedures  . Amb Referral to Nutrition and Diabetic E    Referral Priority:   Routine    Referral Type:   Consultation    Referral Reason:   Specialty Services Required    Number of Visits Requested:   [place], DNP, AGPCNP-BC, AOCNP 05/27/18

## 2018-05-27 ENCOUNTER — Encounter: Payer: Self-pay | Admitting: Oncology

## 2018-05-27 ENCOUNTER — Inpatient Hospital Stay: Payer: Medicare Other

## 2018-05-27 ENCOUNTER — Telehealth: Payer: Self-pay | Admitting: Oncology

## 2018-05-27 ENCOUNTER — Inpatient Hospital Stay (HOSPITAL_BASED_OUTPATIENT_CLINIC_OR_DEPARTMENT_OTHER): Payer: Medicare Other | Admitting: Oncology

## 2018-05-27 VITALS — BP 115/56 | HR 71 | Temp 97.9°F | Resp 18 | Ht 60.0 in | Wt 139.6 lb

## 2018-05-27 DIAGNOSIS — Z5111 Encounter for antineoplastic chemotherapy: Secondary | ICD-10-CM

## 2018-05-27 DIAGNOSIS — C3491 Malignant neoplasm of unspecified part of right bronchus or lung: Secondary | ICD-10-CM

## 2018-05-27 DIAGNOSIS — R634 Abnormal weight loss: Secondary | ICD-10-CM | POA: Diagnosis not present

## 2018-05-27 DIAGNOSIS — Z95828 Presence of other vascular implants and grafts: Secondary | ICD-10-CM

## 2018-05-27 DIAGNOSIS — R5382 Chronic fatigue, unspecified: Secondary | ICD-10-CM

## 2018-05-27 DIAGNOSIS — C3411 Malignant neoplasm of upper lobe, right bronchus or lung: Secondary | ICD-10-CM

## 2018-05-27 DIAGNOSIS — C3490 Malignant neoplasm of unspecified part of unspecified bronchus or lung: Secondary | ICD-10-CM

## 2018-05-27 LAB — CBC WITH DIFFERENTIAL (CANCER CENTER ONLY)
Basophils Absolute: 0 10*3/uL (ref 0.0–0.1)
Basophils Relative: 0 %
EOS PCT: 2 %
Eosinophils Absolute: 0.1 10*3/uL (ref 0.0–0.5)
HCT: 29.3 % — ABNORMAL LOW (ref 34.8–46.6)
Hemoglobin: 9.3 g/dL — ABNORMAL LOW (ref 11.6–15.9)
LYMPHS PCT: 12 %
Lymphs Abs: 0.6 10*3/uL — ABNORMAL LOW (ref 0.9–3.3)
MCH: 29.9 pg (ref 25.1–34.0)
MCHC: 31.7 g/dL (ref 31.5–36.0)
MCV: 94.2 fL (ref 79.5–101.0)
MONO ABS: 1.2 10*3/uL — AB (ref 0.1–0.9)
Monocytes Relative: 24 %
Neutro Abs: 2.9 10*3/uL (ref 1.5–6.5)
Neutrophils Relative %: 62 %
PLATELETS: 261 10*3/uL (ref 145–400)
RBC: 3.11 MIL/uL — ABNORMAL LOW (ref 3.70–5.45)
RDW: 19.2 % — AB (ref 11.2–14.5)
WBC Count: 4.7 10*3/uL (ref 3.9–10.3)

## 2018-05-27 LAB — CMP (CANCER CENTER ONLY)
ALT: 13 U/L (ref 0–55)
ANION GAP: 7 (ref 3–11)
AST: 16 U/L (ref 5–34)
Albumin: 3.2 g/dL — ABNORMAL LOW (ref 3.5–5.0)
Alkaline Phosphatase: 66 U/L (ref 40–150)
BUN: 13 mg/dL (ref 7–26)
CO2: 25 mmol/L (ref 22–29)
Calcium: 9.5 mg/dL (ref 8.4–10.4)
Chloride: 108 mmol/L (ref 98–109)
Creatinine: 0.9 mg/dL (ref 0.60–1.10)
GFR, EST NON AFRICAN AMERICAN: 56 mL/min — AB (ref >60)
Glucose, Bld: 77 mg/dL (ref 70–140)
POTASSIUM: 4.2 mmol/L (ref 3.5–5.1)
Sodium: 140 mmol/L (ref 136–145)
Total Bilirubin: 0.3 mg/dL (ref 0.2–1.2)
Total Protein: 7.2 g/dL (ref 6.4–8.3)

## 2018-05-27 LAB — TSH: TSH: 0.128 u[IU]/mL — AB (ref 0.308–3.960)

## 2018-05-27 MED ORDER — ALTEPLASE 2 MG IJ SOLR
2.0000 mg | Freq: Once | INTRAMUSCULAR | Status: AC
  Administered 2018-05-27: 2 mg
  Filled 2018-05-27: qty 2

## 2018-05-27 MED ORDER — SODIUM CHLORIDE 0.9% FLUSH
10.0000 mL | INTRAVENOUS | Status: DC | PRN
  Administered 2018-05-27: 10 mL
  Filled 2018-05-27: qty 10

## 2018-05-27 MED ORDER — OXYCODONE HCL 5 MG PO TABS: 5.0000 mg | ORAL_TABLET | ORAL | 0 refills | Status: DC | PRN

## 2018-05-27 MED ORDER — SODIUM CHLORIDE 0.9% FLUSH
10.0000 mL | Freq: Once | INTRAVENOUS | Status: AC
  Administered 2018-05-27: 10 mL
  Filled 2018-05-27: qty 10

## 2018-05-27 MED ORDER — ALTEPLASE 2 MG IJ SOLR
INTRAMUSCULAR | Status: AC
  Filled 2018-05-27: qty 2

## 2018-05-27 MED ORDER — HEPARIN SOD (PORK) LOCK FLUSH 100 UNIT/ML IV SOLN
500.0000 [IU] | Freq: Once | INTRAVENOUS | Status: AC | PRN
  Administered 2018-05-27: 500 [IU]
  Filled 2018-05-27: qty 5

## 2018-05-27 MED ORDER — SODIUM CHLORIDE 0.9 % IV SOLN
Freq: Once | INTRAVENOUS | Status: AC
  Administered 2018-05-27: 13:00:00 via INTRAVENOUS

## 2018-05-27 MED ORDER — SODIUM CHLORIDE 0.9 % IV SOLN
1000.0000 mg/m2 | Freq: Once | INTRAVENOUS | Status: AC
  Administered 2018-05-27: 1672 mg via INTRAVENOUS
  Filled 2018-05-27: qty 44

## 2018-05-27 MED ORDER — PROCHLORPERAZINE MALEATE 10 MG PO TABS
10.0000 mg | ORAL_TABLET | Freq: Once | ORAL | Status: AC
  Administered 2018-05-27: 10 mg via ORAL

## 2018-05-27 MED ORDER — PROCHLORPERAZINE MALEATE 10 MG PO TABS
ORAL_TABLET | ORAL | Status: AC
  Filled 2018-05-27: qty 1

## 2018-05-27 NOTE — Patient Instructions (Signed)
[facility] °Discharge Instructions for Patients Receiving Chemotherapy ° °Today you received the following chemotherapy agents Gemzar ° °To help prevent nausea and vomiting after your treatment, we encourage you to take your nausea medication as directed. °  °If you develop nausea and vomiting that is not controlled by your nausea medication, call the clinic.  ° °BELOW ARE SYMPTOMS THAT SHOULD BE REPORTED IMMEDIATELY: °· *FEVER GREATER THAN 100.5 F °· *CHILLS WITH OR WITHOUT FEVER °· NAUSEA AND VOMITING THAT IS NOT CONTROLLED WITH YOUR NAUSEA MEDICATION °· *UNUSUAL SHORTNESS OF BREATH °· *UNUSUAL BRUISING OR BLEEDING °· TENDERNESS IN MOUTH AND THROAT WITH OR WITHOUT PRESENCE OF ULCERS °· *URINARY PROBLEMS °· *BOWEL PROBLEMS °· UNUSUAL RASH °Items with * indicate a potential emergency and should be followed up as soon as possible. ° °Feel free to call the clinic should you have any questions or concerns. The clinic phone number is [phone]. ° °Please show the CHEMO ALERT CARD at check-in to the Emergency Department and triage nurse. ° ° °

## 2018-05-27 NOTE — Progress Notes (Signed)
Unable to get blood return from port for labs.  Verified by RN Melia.  Alerted Seth Bake RN for Erasmo Downer NP.  Pt does not want labs drawn from PIV.  Cathflo administered.

## 2018-05-27 NOTE — Telephone Encounter (Signed)
Scheduled appt per 6/19 los - gave patient aVS and calender per los.

## 2018-05-27 NOTE — Patient Instructions (Signed)
Implanted Port Home Guide An implanted port is a type of central line that is placed under the skin. Central lines are used to provide IV access when treatment or nutrition needs to be given through a person's veins. Implanted ports are used for long-term IV access. An implanted port may be placed because:  You need IV medicine that would be irritating to the small veins in your hands or arms.  You need long-term IV medicines, such as antibiotics.  You need IV nutrition for a long period.  You need frequent blood draws for lab tests.  You need dialysis.  Implanted ports are usually placed in the chest area, but they can also be placed in the upper arm, the abdomen, or the leg. An implanted port has two main parts:  Reservoir. The reservoir is round and will appear as a small, raised area under your skin. The reservoir is the part where a needle is inserted to give medicines or draw blood.  Catheter. The catheter is a thin, flexible tube that extends from the reservoir. The catheter is placed into a large vein. Medicine that is inserted into the reservoir goes into the catheter and then into the vein.  How will I care for my incision site? Do not get the incision site wet. Bathe or shower as directed by your health care provider. How is my port accessed? Special steps must be taken to access the port:  Before the port is accessed, a numbing cream can be placed on the skin. This helps numb the skin over the port site.  Your health care provider uses a sterile technique to access the port. ? Your health care provider must put on a mask and sterile gloves. ? The skin over your port is cleaned carefully with an antiseptic and allowed to dry. ? The port is gently pinched between sterile gloves, and a needle is inserted into the port.  Only "non-coring" port needles should be used to access the port. Once the port is accessed, a blood return should be checked. This helps ensure that the port  is in the vein and is not clogged.  If your port needs to remain accessed for a constant infusion, a clear (transparent) bandage will be placed over the needle site. The bandage and needle will need to be changed every week, or as directed by your health care provider.  Keep the bandage covering the needle clean and dry. Do not get it wet. Follow your health care provider's instructions on how to take a shower or bath while the port is accessed.  If your port does not need to stay accessed, no bandage is needed over the port.  What is flushing? Flushing helps keep the port from getting clogged. Follow your health care provider's instructions on how and when to flush the port. Ports are usually flushed with saline solution or a medicine called heparin. The need for flushing will depend on how the port is used.  If the port is used for intermittent medicines or blood draws, the port will need to be flushed: ? After medicines have been given. ? After blood has been drawn. ? As part of routine maintenance.  If a constant infusion is running, the port may not need to be flushed.  How long will my port stay implanted? The port can stay in for as long as your health care provider thinks it is needed. When it is time for the port to come out, surgery will be   done to remove it. The procedure is similar to the one performed when the port was put in. When should I seek immediate medical care? When you have an implanted port, you should seek immediate medical care if:  You notice a bad smell coming from the incision site.  You have swelling, redness, or drainage at the incision site.  You have more swelling or pain at the port site or the surrounding area.  You have a fever that is not controlled with medicine.  This information is not intended to replace advice given to you by your health care provider. Make sure you discuss any questions you have with your health care provider. Document  Released: 11/25/2005 Document Revised: 05/02/2016 Document Reviewed: 08/02/2013 Elsevier Interactive Patient Education  2017 Elsevier Inc.  

## 2018-05-28 NOTE — Telephone Encounter (Signed)
Open in error

## 2018-06-02 ENCOUNTER — Ambulatory Visit: Payer: Medicare Other

## 2018-06-02 ENCOUNTER — Ambulatory Visit: Payer: Medicare Other | Admitting: Internal Medicine

## 2018-06-02 ENCOUNTER — Other Ambulatory Visit: Payer: Medicare Other

## 2018-06-03 ENCOUNTER — Inpatient Hospital Stay: Payer: Medicare Other

## 2018-06-03 ENCOUNTER — Ambulatory Visit: Payer: Medicare Other | Admitting: Oncology

## 2018-06-03 VITALS — BP 135/64 | HR 75 | Temp 98.1°F | Resp 14

## 2018-06-03 DIAGNOSIS — C3491 Malignant neoplasm of unspecified part of right bronchus or lung: Secondary | ICD-10-CM

## 2018-06-03 DIAGNOSIS — Z5111 Encounter for antineoplastic chemotherapy: Secondary | ICD-10-CM | POA: Diagnosis not present

## 2018-06-03 DIAGNOSIS — Z95828 Presence of other vascular implants and grafts: Secondary | ICD-10-CM

## 2018-06-03 LAB — CBC WITH DIFFERENTIAL (CANCER CENTER ONLY)
BASOS ABS: 0 10*3/uL (ref 0.0–0.1)
Basophils Relative: 1 %
EOS PCT: 1 %
Eosinophils Absolute: 0 10*3/uL (ref 0.0–0.5)
HCT: 28.4 % — ABNORMAL LOW (ref 34.8–46.6)
Hemoglobin: 9.5 g/dL — ABNORMAL LOW (ref 11.6–15.9)
LYMPHS PCT: 19 %
Lymphs Abs: 0.4 10*3/uL — ABNORMAL LOW (ref 0.9–3.3)
MCH: 30.9 pg (ref 25.1–34.0)
MCHC: 33.6 g/dL (ref 31.5–36.0)
MCV: 91.9 fL (ref 79.5–101.0)
MONO ABS: 0.5 10*3/uL (ref 0.1–0.9)
Monocytes Relative: 23 %
Neutro Abs: 1.3 10*3/uL — ABNORMAL LOW (ref 1.5–6.5)
Neutrophils Relative %: 56 %
PLATELETS: 217 10*3/uL (ref 145–400)
RBC: 3.08 MIL/uL — ABNORMAL LOW (ref 3.70–5.45)
RDW: 19.3 % — ABNORMAL HIGH (ref 11.2–14.5)
WBC Count: 2.2 10*3/uL — ABNORMAL LOW (ref 3.9–10.3)

## 2018-06-03 LAB — CMP (CANCER CENTER ONLY)
ALBUMIN: 3.2 g/dL — AB (ref 3.5–5.0)
ALT: 28 U/L (ref 0–44)
AST: 30 U/L (ref 15–41)
Alkaline Phosphatase: 69 U/L (ref 38–126)
Anion gap: 10 (ref 5–15)
BUN: 13 mg/dL (ref 8–23)
CHLORIDE: 108 mmol/L (ref 98–111)
CO2: 21 mmol/L — ABNORMAL LOW (ref 22–32)
CREATININE: 0.86 mg/dL (ref 0.44–1.00)
Calcium: 9.9 mg/dL (ref 8.9–10.3)
GFR, Est AFR Am: >60 mL/min (ref >60)
GFR, Estimated: 59 mL/min — ABNORMAL LOW (ref >60)
GLUCOSE: 133 mg/dL — AB (ref 70–99)
Potassium: 4.1 mmol/L (ref 3.5–5.1)
SODIUM: 139 mmol/L (ref 135–145)
Total Bilirubin: 0.4 mg/dL (ref 0.3–1.2)
Total Protein: 7.4 g/dL (ref 6.5–8.1)

## 2018-06-03 MED ORDER — SODIUM CHLORIDE 0.9 % IV SOLN
Freq: Once | INTRAVENOUS | Status: AC
  Administered 2018-06-03: 09:00:00 via INTRAVENOUS

## 2018-06-03 MED ORDER — HEPARIN SOD (PORK) LOCK FLUSH 100 UNIT/ML IV SOLN
500.0000 [IU] | Freq: Once | INTRAVENOUS | Status: AC | PRN
  Administered 2018-06-03: 500 [IU]
  Filled 2018-06-03: qty 5

## 2018-06-03 MED ORDER — SODIUM CHLORIDE 0.9% FLUSH
10.0000 mL | INTRAVENOUS | Status: DC | PRN
  Administered 2018-06-03: 10 mL
  Filled 2018-06-03: qty 10

## 2018-06-03 MED ORDER — SODIUM CHLORIDE 0.9% FLUSH
10.0000 mL | Freq: Once | INTRAVENOUS | Status: AC
  Administered 2018-06-03: 10 mL
  Filled 2018-06-03: qty 10

## 2018-06-03 MED ORDER — ALTEPLASE 2 MG IJ SOLR
INTRAMUSCULAR | Status: AC
  Filled 2018-06-03: qty 2

## 2018-06-03 MED ORDER — SODIUM CHLORIDE 0.9 % IV SOLN
1000.0000 mg/m2 | Freq: Once | INTRAVENOUS | Status: AC
  Administered 2018-06-03: 1672 mg via INTRAVENOUS
  Filled 2018-06-03: qty 43.97

## 2018-06-03 MED ORDER — ALTEPLASE 2 MG IJ SOLR
2.0000 mg | Freq: Once | INTRAMUSCULAR | Status: AC
  Administered 2018-06-03: 2 mg
  Filled 2018-06-03: qty 2

## 2018-06-03 MED ORDER — PROCHLORPERAZINE MALEATE 10 MG PO TABS
10.0000 mg | ORAL_TABLET | Freq: Once | ORAL | Status: AC
  Administered 2018-06-03: 10 mg via ORAL

## 2018-06-03 NOTE — Progress Notes (Signed)
Per Dr.Mohamed, okay to treat with ANC 1.3  0850am- aspirated 10cc of blood return from pt PAC. Flushed with saline without and good blood return w/o any issues.

## 2018-06-03 NOTE — Patient Instructions (Signed)
[facility] °Discharge Instructions for Patients Receiving Chemotherapy ° °Today you received the following chemotherapy agents Gemzar ° °To help prevent nausea and vomiting after your treatment, we encourage you to take your nausea medication as directed. °  °If you develop nausea and vomiting that is not controlled by your nausea medication, call the clinic.  ° °BELOW ARE SYMPTOMS THAT SHOULD BE REPORTED IMMEDIATELY: °· *FEVER GREATER THAN 100.5 F °· *CHILLS WITH OR WITHOUT FEVER °· NAUSEA AND VOMITING THAT IS NOT CONTROLLED WITH YOUR NAUSEA MEDICATION °· *UNUSUAL SHORTNESS OF BREATH °· *UNUSUAL BRUISING OR BLEEDING °· TENDERNESS IN MOUTH AND THROAT WITH OR WITHOUT PRESENCE OF ULCERS °· *URINARY PROBLEMS °· *BOWEL PROBLEMS °· UNUSUAL RASH °Items with * indicate a potential emergency and should be followed up as soon as possible. ° °Feel free to call the clinic should you have any questions or concerns. The clinic phone number is [phone]. ° °Please show the CHEMO ALERT CARD at check-in to the Emergency Department and triage nurse. ° ° °

## 2018-06-08 ENCOUNTER — Other Ambulatory Visit: Payer: Self-pay | Admitting: *Deleted

## 2018-06-08 DIAGNOSIS — C3491 Malignant neoplasm of unspecified part of right bronchus or lung: Secondary | ICD-10-CM

## 2018-06-08 MED ORDER — OXYCODONE HCL 5 MG PO TABS: 5.0000 mg | ORAL_TABLET | ORAL | 0 refills | Status: DC | PRN

## 2018-06-08 NOTE — Telephone Encounter (Signed)
Spoke with patient and told her prescription was ready for pickup. She verbalized understanding.

## 2018-06-17 ENCOUNTER — Inpatient Hospital Stay: Payer: Medicare Other

## 2018-06-17 ENCOUNTER — Inpatient Hospital Stay: Payer: Medicare Other | Attending: Internal Medicine

## 2018-06-17 ENCOUNTER — Telehealth: Payer: Self-pay | Admitting: Nurse Practitioner

## 2018-06-17 ENCOUNTER — Inpatient Hospital Stay (HOSPITAL_BASED_OUTPATIENT_CLINIC_OR_DEPARTMENT_OTHER): Payer: Medicare Other | Admitting: Nurse Practitioner

## 2018-06-17 ENCOUNTER — Inpatient Hospital Stay: Payer: Medicare Other | Admitting: Nutrition

## 2018-06-17 ENCOUNTER — Encounter: Payer: Self-pay | Admitting: Nurse Practitioner

## 2018-06-17 VITALS — BP 117/63 | HR 69 | Temp 98.3°F | Resp 17 | Ht 60.0 in | Wt 140.7 lb

## 2018-06-17 DIAGNOSIS — D649 Anemia, unspecified: Secondary | ICD-10-CM

## 2018-06-17 DIAGNOSIS — C3491 Malignant neoplasm of unspecified part of right bronchus or lung: Secondary | ICD-10-CM

## 2018-06-17 DIAGNOSIS — C3411 Malignant neoplasm of upper lobe, right bronchus or lung: Secondary | ICD-10-CM | POA: Insufficient documentation

## 2018-06-17 DIAGNOSIS — Z5111 Encounter for antineoplastic chemotherapy: Secondary | ICD-10-CM | POA: Insufficient documentation

## 2018-06-17 DIAGNOSIS — R21 Rash and other nonspecific skin eruption: Secondary | ICD-10-CM | POA: Diagnosis not present

## 2018-06-17 DIAGNOSIS — Z95828 Presence of other vascular implants and grafts: Secondary | ICD-10-CM

## 2018-06-17 DIAGNOSIS — C3490 Malignant neoplasm of unspecified part of unspecified bronchus or lung: Secondary | ICD-10-CM

## 2018-06-17 DIAGNOSIS — R5382 Chronic fatigue, unspecified: Secondary | ICD-10-CM

## 2018-06-17 LAB — SAMPLE TO BLOOD BANK

## 2018-06-17 LAB — CMP (CANCER CENTER ONLY)
ALT: 39 U/L (ref 0–44)
ANION GAP: 9 (ref 5–15)
AST: 29 U/L (ref 15–41)
Albumin: 2.9 g/dL — ABNORMAL LOW (ref 3.5–5.0)
Alkaline Phosphatase: 67 U/L (ref 38–126)
BUN: 10 mg/dL (ref 8–23)
CHLORIDE: 106 mmol/L (ref 98–111)
CO2: 26 mmol/L (ref 22–32)
CREATININE: 1 mg/dL (ref 0.44–1.00)
Calcium: 9.2 mg/dL (ref 8.9–10.3)
GFR, EST AFRICAN AMERICAN: 57 mL/min — AB (ref >60)
GFR, EST NON AFRICAN AMERICAN: 49 mL/min — AB (ref >60)
Glucose, Bld: 119 mg/dL — ABNORMAL HIGH (ref 70–99)
Potassium: 4 mmol/L (ref 3.5–5.1)
SODIUM: 141 mmol/L (ref 135–145)
Total Bilirubin: 0.3 mg/dL (ref 0.3–1.2)
Total Protein: 6.4 g/dL — ABNORMAL LOW (ref 6.5–8.1)

## 2018-06-17 LAB — CBC WITH DIFFERENTIAL (CANCER CENTER ONLY)
BASOS ABS: 0 10*3/uL (ref 0.0–0.1)
BASOS PCT: 0 %
Eosinophils Absolute: 0.1 10*3/uL (ref 0.0–0.5)
Eosinophils Relative: 2 %
HEMATOCRIT: 25.1 % — AB (ref 34.8–46.6)
Hemoglobin: 8.1 g/dL — ABNORMAL LOW (ref 11.6–15.9)
Lymphocytes Relative: 8 %
Lymphs Abs: 0.5 10*3/uL — ABNORMAL LOW (ref 0.9–3.3)
MCH: 31.8 pg (ref 25.1–34.0)
MCHC: 32.3 g/dL (ref 31.5–36.0)
MCV: 98.4 fL (ref 79.5–101.0)
Monocytes Absolute: 1.1 10*3/uL — ABNORMAL HIGH (ref 0.1–0.9)
Monocytes Relative: 19 %
NEUTROS ABS: 3.9 10*3/uL (ref 1.5–6.5)
NEUTROS PCT: 71 %
Platelet Count: 235 10*3/uL (ref 145–400)
RBC: 2.55 MIL/uL — ABNORMAL LOW (ref 3.70–5.45)
RDW: 22.9 % — ABNORMAL HIGH (ref 11.2–14.5)
WBC: 5.5 10*3/uL (ref 3.9–10.3)

## 2018-06-17 LAB — TSH: TSH: 0.231 u[IU]/mL — ABNORMAL LOW (ref 0.308–3.960)

## 2018-06-17 LAB — ABO/RH: ABO/RH(D): A NEG

## 2018-06-17 LAB — PREPARE RBC (CROSSMATCH)

## 2018-06-17 MED ORDER — SODIUM CHLORIDE 0.9% FLUSH
10.0000 mL | Freq: Once | INTRAVENOUS | Status: AC
  Administered 2018-06-17: 10 mL
  Filled 2018-06-17: qty 10

## 2018-06-17 MED ORDER — SODIUM CHLORIDE 0.9 % IV SOLN
Freq: Once | INTRAVENOUS | Status: AC
  Administered 2018-06-17: 15:00:00 via INTRAVENOUS

## 2018-06-17 MED ORDER — SODIUM CHLORIDE 0.9 % IV SOLN
1000.0000 mg/m2 | Freq: Once | INTRAVENOUS | Status: AC
  Administered 2018-06-17: 1672 mg via INTRAVENOUS
  Filled 2018-06-17: qty 43.97

## 2018-06-17 MED ORDER — HEPARIN SOD (PORK) LOCK FLUSH 100 UNIT/ML IV SOLN
500.0000 [IU] | Freq: Once | INTRAVENOUS | Status: AC | PRN
  Administered 2018-06-17: 500 [IU]
  Filled 2018-06-17: qty 5

## 2018-06-17 MED ORDER — SODIUM CHLORIDE 0.9% FLUSH
10.0000 mL | INTRAVENOUS | Status: DC | PRN
  Administered 2018-06-17: 10 mL
  Filled 2018-06-17: qty 10

## 2018-06-17 MED ORDER — PROCHLORPERAZINE MALEATE 10 MG PO TABS
ORAL_TABLET | ORAL | Status: AC
  Filled 2018-06-17: qty 1

## 2018-06-17 MED ORDER — PROCHLORPERAZINE MALEATE 10 MG PO TABS
10.0000 mg | ORAL_TABLET | Freq: Once | ORAL | Status: AC
  Administered 2018-06-17: 10 mg via ORAL

## 2018-06-17 NOTE — Patient Instructions (Signed)
[facility] Discharge Instructions for Patients Receiving Chemotherapy  Today you received the following chemotherapy agents:  Gemcitabine  To help prevent nausea and vomiting after your treatment, we encourage you to take your nausea medication as prescribed.    If you develop nausea and vomiting that is not controlled by your nausea medication, call the clinic.   BELOW ARE SYMPTOMS THAT SHOULD BE REPORTED IMMEDIATELY:  *FEVER GREATER THAN 100.5 F  *CHILLS WITH OR WITHOUT FEVER  NAUSEA AND VOMITING THAT IS NOT CONTROLLED WITH YOUR NAUSEA MEDICATION  *UNUSUAL SHORTNESS OF BREATH  *UNUSUAL BRUISING OR BLEEDING  TENDERNESS IN MOUTH AND THROAT WITH OR WITHOUT PRESENCE OF ULCERS  *URINARY PROBLEMS  *BOWEL PROBLEMS  UNUSUAL RASH Items with * indicate a potential emergency and should be followed up as soon as possible.  Feel free to call the clinic should you have any questions or concerns. The clinic phone number is [phone].  Please show the CHEMO ALERT CARD at check-in to the Emergency Department and triage nurse.   

## 2018-06-17 NOTE — Progress Notes (Signed)
Nutrition Assessment  Reason for Assessment: Consult    ASSESSMENT:   84 year old female with lung cancer currently receiving gemcitabine.  Original diagnosis in March 2018.  Past medical history of CAD, CHF, COPD, breast cancer.  Met with patient and daughter during infusion this pm.  Patient reports appetite has improved over the last few days.  Reports the week after chemotherapy appetite is poor due to fatigue.  Does not report any problems with nausea, constipation, diarrhea, mouth sores, trouble chewing or swallowing.  Reports ate good breakfast this am of 2 eggs, bacon, toast, grits.  Lunch is usually snack of cheesezits or equavilant.  Supper is grilled cheese sandwich, vienna sausages or leftovers from Sunday dinner.  Reports that she drinks ensure original about 1 time per day.      Wt Readings from Last 10 Encounters:  06/17/18 140 lb 11.2 oz (63.8 kg)  05/27/18 139 lb 9.6 oz (63.3 kg)  05/06/18 145 lb 9.6 oz (66 kg)  04/21/18 145 lb 1.6 oz (65.8 kg)  04/02/18 146 lb 3.2 oz (66.3 kg)  03/31/18 148 lb 6.4 oz (67.3 kg)  03/25/18 148 lb 11.2 oz (67.4 kg)  03/10/18 145 lb 9.6 oz (66 kg)  02/25/18 145 lb 12.8 oz (66.1 kg)  02/09/18 147 lb 1.6 oz (66.7 kg)     MEDICATIONS: calcium/vit d, lasix, synthroid, fish oil, B12, compazine, carafate   LABS: reviewed   ANTHROPOMETRICS: Height:  Ht Readings from Last 1 Encounters:  06/17/18 5' (1.524 m)   Weight:  Wt Readings from Last 1 Encounters:  06/17/18 140 lb 11.2 oz (63.8 kg)   BMI:  BMI Readings from Last 1 Encounters:  06/17/18 27.48 kg/m   UBW: 160 in March 2018  5% weight loss in the last 3 months, not significant   ESTIMATED ENERGY NEEDS:  Kcal: 1600-1920 calories/d Protein:80-96 g/d Fluid: 1.9 LD   NUTRITION - FOCUSED PHYSICAL EXAM: deferred in infusion room   NUTRITION DIAGNOSIS:  Unintentional weight loss related to cancer and cancer related treatments, poor appetite as evidenced by per  patient/family report.     INTERVENTION:  Discussed strategies to increase calories and protein. Handout given Discussed choosing high calorie nutrition shakes.  Coupons given Encouraged small frequent meals q 2-3 hours Contact information given  GOAL:  Patient will meet greater than or equal to 90% of their needs, Other (Comment)(weight maintenance)   MONITOR:  PO intake, Weight trends   Next Visit: August 7 during infusion   A[place] Courts B. Zenia Resides, [place], [place] Registered Dietitian [phone] (pager)

## 2018-06-17 NOTE — Telephone Encounter (Signed)
Scheduled appt per 7/10 los - pt aware - no print out wanted per patient.

## 2018-06-17 NOTE — Progress Notes (Signed)
  [facility] OFFICE PROGRESS NOTE   DIAGNOSIS:Stage IV (T2b, N3, M1 a) non-small cell lung cancer, squamous cell carcinoma presented with large right upper lobe lung mass in addition to bilateral mediastinal lymphadenopathy as well as left upper lobe lung nodule diagnosed in March 2018. PDL 1 expression is 5%.   PRIOR THERAPY: 1)Systemic chemotherapy with carboplatin for AUC of 5 and paclitaxel 175 MG/M2 every 3 weeks. First dose 03/24/2017. Status post 5 cycles. Last cycle was given 06/18/2017. Discontinued secondary tointoleranceand significant peripheral neuropathy. 2)Second line treatment with immunotherapy with Keytruda 200 mg IV every 3 weeks. First dose February 17, 2018. Status post 3cycles. 3)palliative radiotherapy to the large right upper lobe lung mass.  CURRENT THERAPY:Systemic chemotherapy with gemcitabine 1000 mg/M2 on days 1 and 8 every 3 weeks. First dose May 29,2019.  INTERVAL HISTORY:   Ms. Foucher returns as scheduled.  She completed cycle 2 gemcitabine 05/27/2018, 06/03/2018.  No significant nausea.  Mouth was "sore" last week.  No ulcerations.  No diarrhea.  She noticed a red, sore spot on the right ear about 2 months ago.  She has stable dyspnea on exertion.  She had a "coughing spell" after taking some cough medicine last week.  She thinks the cough medicine went down the "wrong way".  Her family called 52 and she was evaluated at home.  She has had no further issues with coughing.  Objective:  Vital signs in last 24 hours:  Blood pressure 117/63, pulse 69, temperature 98.3 F (36.8 C), temperature source Oral, resp. rate 17, height 5' (1.524 m), weight 140 lb 11.2 oz (63.8 kg), SpO2 97 %.    HEENT: No thrush or ulcers. Resp: Lungs clear bilaterally. Cardio: Regular rate and rhythm. GI: Abdomen soft and nontender.  No hepatomegaly. Vascular: Bilateral ankle edema. Neuro: Alert and oriented. Skin: Helix, mid right ear, with area of dryness  and mild erythema.   Lab Results:  Lab Results  Component Value Date   WBC 5.5 06/17/2018   HGB 8.1 (L) 06/17/2018   HCT 25.1 (L) 06/17/2018   MCV 98.4 06/17/2018   PLT 235 06/17/2018   NEUTROABS 3.9 06/17/2018    Imaging:  No results found.  Medications: I have reviewed the patient's current medications.  Assessment/Plan: 1. Stage IV non-small cell lung cancer, adenocarcinoma, with PDL 1 expression of 5%.  Treated in the past with carboplatin/Taxol and most recently Pembrolizumab with disease progression.  Currently on active treatment with gemcitabine today on a day 1 and day 8 schedule every 3 weeks with 2 cycles completed to date.    Disposition: Ms. Jaso has completed 2 cycles of gemcitabine.  Plan to proceed with cycle 3 today as scheduled. Dr. Julien Nordmann recommends restaging CT scans after completion of this cycle.  We reviewed the CBC from today.  She has progressive anemia.  Dr. Julien Nordmann recommends proceeding with a blood transfusion.  Risks associated with a blood transfusion were reviewed with Ms. Kirschbaum and her daughter at today's visit.  She agrees to proceed.  We will arrange for the blood transfusion in the next 1 to 2 days.  She will return for cycle 3-day 8 gemcitabine in 1 week.  Restaging CT scans around 07/06/2018.  She will return for lab and follow-up on 07/08/2018.  She will contact the office in the interim with any problems.  Plan reviewed with Dr. Julien Nordmann.    Ned Card ANP/GNP-BC   06/17/2018  1:54 PM

## 2018-06-18 ENCOUNTER — Inpatient Hospital Stay: Payer: Medicare Other

## 2018-06-18 ENCOUNTER — Other Ambulatory Visit: Payer: Self-pay | Admitting: Medical Oncology

## 2018-06-18 DIAGNOSIS — Z5111 Encounter for antineoplastic chemotherapy: Secondary | ICD-10-CM | POA: Diagnosis not present

## 2018-06-18 DIAGNOSIS — D649 Anemia, unspecified: Secondary | ICD-10-CM

## 2018-06-18 LAB — PREPARE RBC (CROSSMATCH)

## 2018-06-18 MED ORDER — DIPHENHYDRAMINE HCL 25 MG PO CAPS
25.0000 mg | ORAL_CAPSULE | Freq: Once | ORAL | Status: AC
  Administered 2018-06-18: 25 mg via ORAL

## 2018-06-18 MED ORDER — ACETAMINOPHEN 325 MG PO TABS
650.0000 mg | ORAL_TABLET | Freq: Once | ORAL | Status: AC
  Administered 2018-06-18: 650 mg via ORAL

## 2018-06-18 MED ORDER — HEPARIN SOD (PORK) LOCK FLUSH 100 UNIT/ML IV SOLN
500.0000 [IU] | Freq: Every day | INTRAVENOUS | Status: AC | PRN
  Administered 2018-06-18: 500 [IU]
  Filled 2018-06-18: qty 5

## 2018-06-18 MED ORDER — SODIUM CHLORIDE 0.9% FLUSH
10.0000 mL | INTRAVENOUS | Status: AC | PRN
  Administered 2018-06-18: 10 mL
  Filled 2018-06-18: qty 10

## 2018-06-18 MED ORDER — ACETAMINOPHEN 325 MG PO TABS
ORAL_TABLET | ORAL | Status: AC
  Filled 2018-06-18: qty 2

## 2018-06-18 MED ORDER — DIPHENHYDRAMINE HCL 25 MG PO CAPS
ORAL_CAPSULE | ORAL | Status: AC
  Filled 2018-06-18: qty 1

## 2018-06-18 MED ORDER — SODIUM CHLORIDE 0.9 % IV SOLN
250.0000 mL | Freq: Once | INTRAVENOUS | Status: AC
  Administered 2018-06-18: 250 mL via INTRAVENOUS

## 2018-06-18 NOTE — Patient Instructions (Signed)

## 2018-06-19 LAB — BPAM RBC
Blood Product Expiration Date: 201908012359
Blood Product Expiration Date: 201908012359
ISSUE DATE / TIME: 201907111207
ISSUE DATE / TIME: 201907111207
Unit Type and Rh: 600
Unit Type and Rh: 600

## 2018-06-19 LAB — TYPE AND SCREEN
ABO/RH(D): A NEG
ANTIBODY SCREEN: NEGATIVE
Unit division: 0
Unit division: 0

## 2018-06-23 ENCOUNTER — Telehealth: Payer: Self-pay | Admitting: Medical Oncology

## 2018-06-23 NOTE — Telephone Encounter (Signed)
requests refill and increase in quantity for oxyir, She takes 3/day and it last her 10 days.

## 2018-06-24 NOTE — Telephone Encounter (Signed)
OK to refill same amount.

## 2018-06-25 ENCOUNTER — Telehealth: Payer: Self-pay | Admitting: *Deleted

## 2018-06-25 ENCOUNTER — Inpatient Hospital Stay: Payer: Medicare Other

## 2018-06-25 DIAGNOSIS — C3491 Malignant neoplasm of unspecified part of right bronchus or lung: Secondary | ICD-10-CM

## 2018-06-25 DIAGNOSIS — Z95828 Presence of other vascular implants and grafts: Secondary | ICD-10-CM

## 2018-06-25 DIAGNOSIS — Z5111 Encounter for antineoplastic chemotherapy: Secondary | ICD-10-CM | POA: Diagnosis not present

## 2018-06-25 LAB — CBC WITH DIFFERENTIAL (CANCER CENTER ONLY)
BASOS PCT: 0 %
Basophils Absolute: 0 10*3/uL (ref 0.0–0.1)
EOS ABS: 0 10*3/uL (ref 0.0–0.5)
EOS PCT: 1 %
HCT: 33.2 % — ABNORMAL LOW (ref 34.8–46.6)
Hemoglobin: 11 g/dL — ABNORMAL LOW (ref 11.6–15.9)
LYMPHS PCT: 11 %
Lymphs Abs: 0.3 10*3/uL — ABNORMAL LOW (ref 0.9–3.3)
MCH: 31.4 pg (ref 25.1–34.0)
MCHC: 33.1 g/dL (ref 31.5–36.0)
MCV: 94.8 fL (ref 79.5–101.0)
MONO ABS: 0.7 10*3/uL (ref 0.1–0.9)
Monocytes Relative: 22 %
Neutro Abs: 1.9 10*3/uL (ref 1.5–6.5)
Neutrophils Relative %: 66 %
PLATELETS: 161 10*3/uL (ref 145–400)
RBC: 3.5 MIL/uL — AB (ref 3.70–5.45)
RDW: 19.3 % — AB (ref 11.2–14.5)
WBC: 3 10*3/uL — AB (ref 3.9–10.3)

## 2018-06-25 LAB — CMP (CANCER CENTER ONLY)
ALK PHOS: 61 U/L (ref 38–126)
ALT: 118 U/L — AB (ref 0–44)
AST: 119 U/L — AB (ref 15–41)
Albumin: 2.8 g/dL — ABNORMAL LOW (ref 3.5–5.0)
Anion gap: 8 (ref 5–15)
BILIRUBIN TOTAL: 0.3 mg/dL (ref 0.3–1.2)
BUN: 9 mg/dL (ref 8–23)
CALCIUM: 9.3 mg/dL (ref 8.9–10.3)
CO2: 25 mmol/L (ref 22–32)
CREATININE: 0.83 mg/dL (ref 0.44–1.00)
Chloride: 107 mmol/L (ref 98–111)
GFR, Estimated: >60 mL/min (ref >60)
Glucose, Bld: 173 mg/dL — ABNORMAL HIGH (ref 70–99)
Potassium: 4.1 mmol/L (ref 3.5–5.1)
Sodium: 140 mmol/L (ref 135–145)
TOTAL PROTEIN: 6.5 g/dL (ref 6.5–8.1)

## 2018-06-25 MED ORDER — HEPARIN SOD (PORK) LOCK FLUSH 100 UNIT/ML IV SOLN
500.0000 [IU] | Freq: Once | INTRAVENOUS | Status: AC
  Administered 2018-06-25: 500 [IU]
  Filled 2018-06-25: qty 5

## 2018-06-25 MED ORDER — SODIUM CHLORIDE 0.9% FLUSH
10.0000 mL | Freq: Once | INTRAVENOUS | Status: AC
  Administered 2018-06-25: 10 mL
  Filled 2018-06-25: qty 10

## 2018-06-25 MED ORDER — SODIUM CHLORIDE 0.9 % IV SOLN: INTRAVENOUS | Status: DC

## 2018-06-25 MED ORDER — OXYCODONE HCL 5 MG PO TABS: 5.0000 mg | ORAL_TABLET | ORAL | 0 refills | Status: DC | PRN

## 2018-06-25 MED ORDER — TBO-FILGRASTIM 300 MCG/0.5ML ~~LOC~~ SOSY: 300.0000 ug | PREFILLED_SYRINGE | Freq: Every morning | SUBCUTANEOUS | Status: DC

## 2018-06-25 NOTE — Telephone Encounter (Signed)
rx given to pt

## 2018-06-25 NOTE — Patient Instructions (Signed)
You did not received treatment today due to your elevated liver enzymes. Call for nausea , vomiting, diarrhea fever.

## 2018-06-25 NOTE — Progress Notes (Signed)
CBC / CMET reviewed by MD no treatment today.

## 2018-06-25 NOTE — Progress Notes (Signed)
NO treatment today per Dr Julien Nordmann due to elevated liver enzymes.

## 2018-07-03 ENCOUNTER — Telehealth: Payer: Self-pay

## 2018-07-03 ENCOUNTER — Other Ambulatory Visit: Payer: Self-pay | Admitting: Oncology

## 2018-07-03 DIAGNOSIS — C3491 Malignant neoplasm of unspecified part of right bronchus or lung: Secondary | ICD-10-CM

## 2018-07-03 MED ORDER — OXYCODONE HCL 5 MG PO TABS: 5.0000 mg | ORAL_TABLET | ORAL | 0 refills | Status: DC | PRN

## 2018-07-03 NOTE — Telephone Encounter (Signed)
Patient calls for refill on her Oxycodone 5 mg tablets, wants to pick up script sometime after lunch.  [phone]

## 2018-07-06 ENCOUNTER — Ambulatory Visit (HOSPITAL_COMMUNITY)
Admission: RE | Admit: 2018-07-06 | Discharge: 2018-07-06 | Disposition: A | Discharge status: Home / Self Care | Payer: Medicare Other | Source: Ambulatory Visit | Attending: Nurse Practitioner | Admitting: Nurse Practitioner

## 2018-07-06 ENCOUNTER — Telehealth: Payer: Self-pay | Admitting: *Deleted

## 2018-07-06 ENCOUNTER — Encounter (HOSPITAL_COMMUNITY): Payer: Self-pay | Admitting: Radiology

## 2018-07-06 DIAGNOSIS — R591 Generalized enlarged lymph nodes: Secondary | ICD-10-CM | POA: Diagnosis not present

## 2018-07-06 DIAGNOSIS — C3491 Malignant neoplasm of unspecified part of right bronchus or lung: Secondary | ICD-10-CM | POA: Diagnosis not present

## 2018-07-06 MED ORDER — HEPARIN SOD (PORK) LOCK FLUSH 100 UNIT/ML IV SOLN
500.0000 [IU] | Freq: Once | INTRAVENOUS | Status: AC
  Administered 2018-07-06: 500 [IU] via INTRAVENOUS

## 2018-07-06 MED ORDER — IOPAMIDOL (ISOVUE-300) INJECTION 61%
100.0000 mL | Freq: Once | INTRAVENOUS | Status: AC | PRN
  Administered 2018-07-06: 100 mL via INTRAVENOUS

## 2018-07-06 MED ORDER — IOPAMIDOL (ISOVUE-300) INJECTION 61%
INTRAVENOUS | Status: AC
  Filled 2018-07-06: qty 100

## 2018-07-06 MED ORDER — HEPARIN SOD (PORK) LOCK FLUSH 100 UNIT/ML IV SOLN
INTRAVENOUS | Status: AC
  Filled 2018-07-06: qty 5

## 2018-07-06 NOTE — Telephone Encounter (Signed)
Daughter, Wyatt Mage here for for prescription pick-up reports; "Mom having more pain, taking more medication, more often.  The Oxy-IR works but wears off in three hours.  Needs a stronger new medication.  Sharp pain to her back moves to breast has increased and has a new pain to her knees.  Changes started two weeks ago.  She hollers out c/o sharp pain." "Mom starts drinking contrast at 11:00 am to return for scans today at 1:00 pm.  Want to make doctor aware of her pain before Wednesday's visit.  She's okay to wait until next scheduled appointment"  Patient in car, daughter declines [facility] for evaluation at this time.  Denies other symptoms, complaints or changes.

## 2018-07-08 ENCOUNTER — Inpatient Hospital Stay: Payer: Medicare Other

## 2018-07-08 ENCOUNTER — Other Ambulatory Visit: Payer: Medicare Other

## 2018-07-08 ENCOUNTER — Inpatient Hospital Stay (HOSPITAL_BASED_OUTPATIENT_CLINIC_OR_DEPARTMENT_OTHER): Payer: Medicare Other | Admitting: Internal Medicine

## 2018-07-08 ENCOUNTER — Telehealth: Payer: Self-pay | Admitting: Internal Medicine

## 2018-07-08 ENCOUNTER — Encounter: Payer: Self-pay | Admitting: Internal Medicine

## 2018-07-08 ENCOUNTER — Ambulatory Visit: Payer: Medicare Other

## 2018-07-08 VITALS — BP 127/66 | HR 74 | Temp 98.5°F | Resp 18 | Ht 60.0 in | Wt 143.6 lb

## 2018-07-08 DIAGNOSIS — C3490 Malignant neoplasm of unspecified part of unspecified bronchus or lung: Secondary | ICD-10-CM

## 2018-07-08 DIAGNOSIS — R5382 Chronic fatigue, unspecified: Secondary | ICD-10-CM

## 2018-07-08 DIAGNOSIS — G893 Neoplasm related pain (acute) (chronic): Secondary | ICD-10-CM | POA: Insufficient documentation

## 2018-07-08 DIAGNOSIS — C3491 Malignant neoplasm of unspecified part of right bronchus or lung: Secondary | ICD-10-CM

## 2018-07-08 DIAGNOSIS — C3411 Malignant neoplasm of upper lobe, right bronchus or lung: Secondary | ICD-10-CM | POA: Diagnosis not present

## 2018-07-08 DIAGNOSIS — R21 Rash and other nonspecific skin eruption: Secondary | ICD-10-CM | POA: Diagnosis not present

## 2018-07-08 DIAGNOSIS — D649 Anemia, unspecified: Secondary | ICD-10-CM | POA: Diagnosis not present

## 2018-07-08 DIAGNOSIS — Z5111 Encounter for antineoplastic chemotherapy: Secondary | ICD-10-CM | POA: Diagnosis not present

## 2018-07-08 DIAGNOSIS — Z95828 Presence of other vascular implants and grafts: Secondary | ICD-10-CM

## 2018-07-08 DIAGNOSIS — I119 Hypertensive heart disease without heart failure: Secondary | ICD-10-CM

## 2018-07-08 LAB — CBC WITH DIFFERENTIAL (CANCER CENTER ONLY)
BASOS ABS: 0 10*3/uL (ref 0.0–0.1)
Basophils Relative: 1 %
EOS PCT: 2 %
Eosinophils Absolute: 0.1 10*3/uL (ref 0.0–0.5)
HEMATOCRIT: 33.3 % — AB (ref 34.8–46.6)
Hemoglobin: 10.5 g/dL — ABNORMAL LOW (ref 11.6–15.9)
LYMPHS ABS: 0.5 10*3/uL — AB (ref 0.9–3.3)
LYMPHS PCT: 9 %
MCH: 31.4 pg (ref 25.1–34.0)
MCHC: 31.5 g/dL (ref 31.5–36.0)
MCV: 99.7 fL (ref 79.5–101.0)
MONO ABS: 1 10*3/uL — AB (ref 0.1–0.9)
Monocytes Relative: 15 %
NEUTROS ABS: 4.7 10*3/uL (ref 1.5–6.5)
Neutrophils Relative %: 73 %
Platelet Count: 199 10*3/uL (ref 145–400)
RBC: 3.34 MIL/uL — ABNORMAL LOW (ref 3.70–5.45)
RDW: 21.4 % — AB (ref 11.2–14.5)
WBC Count: 6.4 10*3/uL (ref 3.9–10.3)

## 2018-07-08 LAB — CMP (CANCER CENTER ONLY)
ALT: 23 U/L (ref 0–44)
AST: 17 U/L (ref 15–41)
Albumin: 2.7 g/dL — ABNORMAL LOW (ref 3.5–5.0)
Alkaline Phosphatase: 58 U/L (ref 38–126)
Anion gap: 8 (ref 5–15)
BILIRUBIN TOTAL: 0.3 mg/dL (ref 0.3–1.2)
BUN: 9 mg/dL (ref 8–23)
CO2: 26 mmol/L (ref 22–32)
CREATININE: 0.83 mg/dL (ref 0.44–1.00)
Calcium: 9 mg/dL (ref 8.9–10.3)
Chloride: 107 mmol/L (ref 98–111)
Glucose, Bld: 160 mg/dL — ABNORMAL HIGH (ref 70–99)
POTASSIUM: 4.2 mmol/L (ref 3.5–5.1)
Sodium: 141 mmol/L (ref 135–145)
TOTAL PROTEIN: 6.2 g/dL — AB (ref 6.5–8.1)

## 2018-07-08 LAB — TSH: TSH: 1.33 u[IU]/mL (ref 0.308–3.960)

## 2018-07-08 MED ORDER — OXYCODONE HCL 5 MG PO TABS: 5.0000 mg | ORAL_TABLET | ORAL | 0 refills | Status: DC | PRN

## 2018-07-08 MED ORDER — SODIUM CHLORIDE 0.9 % IV SOLN
1000.0000 mg/m2 | Freq: Once | INTRAVENOUS | Status: AC
  Administered 2018-07-08: 1672 mg via INTRAVENOUS
  Filled 2018-07-08: qty 43.97

## 2018-07-08 MED ORDER — PROCHLORPERAZINE MALEATE 10 MG PO TABS
10.0000 mg | ORAL_TABLET | Freq: Once | ORAL | Status: AC
  Administered 2018-07-08: 10 mg via ORAL

## 2018-07-08 MED ORDER — SODIUM CHLORIDE 0.9% FLUSH
10.0000 mL | Freq: Once | INTRAVENOUS | Status: AC
  Administered 2018-07-08: 10 mL
  Filled 2018-07-08: qty 10

## 2018-07-08 MED ORDER — FENTANYL 25 MCG/HR TD PT72: 25.0000 ug | MEDICATED_PATCH | TRANSDERMAL | 0 refills | Status: DC

## 2018-07-08 MED ORDER — SODIUM CHLORIDE 0.9% FLUSH
10.0000 mL | INTRAVENOUS | Status: DC | PRN
  Administered 2018-07-08: 10 mL
  Filled 2018-07-08: qty 10

## 2018-07-08 MED ORDER — SODIUM CHLORIDE 0.9 % IV SOLN
Freq: Once | INTRAVENOUS | Status: AC
  Administered 2018-07-08: 11:00:00 via INTRAVENOUS
  Filled 2018-07-08: qty 250

## 2018-07-08 MED ORDER — HEPARIN SOD (PORK) LOCK FLUSH 100 UNIT/ML IV SOLN
500.0000 [IU] | Freq: Once | INTRAVENOUS | Status: AC | PRN
Start: 2018-07-08 — End: 2018-07-08
  Administered 2018-07-08: 500 [IU]
  Filled 2018-07-08: qty 5

## 2018-07-08 MED ORDER — PROCHLORPERAZINE MALEATE 10 MG PO TABS
ORAL_TABLET | ORAL | Status: AC
Start: 2018-07-08 — End: ?
  Filled 2018-07-08: qty 1

## 2018-07-08 MED ORDER — HYDROCORTISONE 1 % EX LOTN: 1.0000 "application " | TOPICAL_LOTION | Freq: Two times a day (BID) | CUTANEOUS | 0 refills | Status: AC

## 2018-07-08 NOTE — Telephone Encounter (Signed)
Scheduled appt per 7/31 los - gave patient aVS and calender per los.

## 2018-07-08 NOTE — Progress Notes (Signed)
[place] [phone]   [phone]  OFFICE PROGRESS NOTE  Lajean Manes, MD [address] [place] [address]  DIAGNOSIS: Stage IV (T2b, N3, M1 a) non-small cell lung cancer, squamous cell carcinoma presented with large right upper lobe lung mass in addition to bilateral mediastinal lymphadenopathy as well as left upper lobe lung nodule diagnosed in March 2018. PDL 1 expression is 5%.   PRIOR THERAPY:  1) Systemic chemotherapy with carboplatin for AUC of 5 and paclitaxel 175 MG/M2 every 3 weeks. First dose 03/24/2017. Status post 5 cycles. Last cycle was given 06/18/2017. Discontinued secondary to intolerance and significant peripheral neuropathy. 2) Second line treatment with immunotherapy with Keytruda 200 mg IV every 3 weeks.  First dose February 17, 2018.  Status post 3 cycles. 3) palliative radiotherapy to the large right upper lobe lung mass.  CURRENT THERAPY: Systemic chemotherapy with gemcitabine 1000 mg/M2 on days 1 and 8 every 3 weeks.  First dose May 06, 2018.  Status post 3 cycles.  INTERVAL HISTORY: Maria Washington 84 y.o. female returns to the clinic today for follow-up visit accompanied by HER-2 daughters and another family member.  The patient is feeling fine today with no specific complaints except for the persistent aching pain.  She used Percocet every 3-4 hours.  She denied having any nausea, vomiting, diarrhea or constipation.  She denied having any fever or chills.  She has no headache or visual changes.  She denied having any shortness of breath but has mild cough with no hemoptysis.  She continues to tolerate her treatment with gemcitabine fairly well.  The patient had a repeat CT scan of the chest, abdomen and pelvis performed recently and she is here for evaluation and discussion of her scan results.  MEDICAL HISTORY: Past Medical History:  Diagnosis Date  . Aortic atherosclerosis ([facility]) 02/10/2017  . Arthritis     . CAD (coronary artery disease), native coronary artery    3 vessel calcification noted on CT scan   . Cardiac pacemaker in situ 12/05/2011  . Chronic diastolic congestive heart failure ([facility])   . COPD (chronic obstructive pulmonary disease) ([facility])    spot on lung  . Dehydration 06/04/2017  . Encounter for antineoplastic chemotherapy 03/04/2017  . Goals of care, counseling/discussion 03/17/2017  . History of breast cancer    Treated with lumpectomy, radiation, and chemo sees Dr. Ralene Ok   . Hypertensive heart disease without CHF   . Hypokalemia 04/18/2017  . Hypothyroid   . Mitral regurgitation   . Paroxysmal atrial fibrillation ([facility])    CHA2DS2VASC score 5  . Personal history of chemotherapy   . Personal history of radiation therapy   . Second degree heart block   . Stage IV squamous cell carcinoma of right lung ([facility]) 03/04/2017    ALLERGIES:  is allergic to clindamycin/lincomycin and penicillins.  MEDICATIONS:  Current Outpatient Medications  Medication Sig Dispense Refill  . alendronate (FOSAMAX) 70 MG tablet Take 70 mg by mouth every Monday.     . calcium-vitamin D (OSCAL WITH D) 500-200 MG-UNIT per tablet Take 1 tablet by mouth 2 (two) times daily.      . cholecalciferol (VITAMIN D) 1000 units tablet Take 1,000 Units by mouth daily.     Marland Kitchen diltiazem (CARDIZEM CD) 120 MG 24 hr capsule Take 120 mg by mouth daily.  3  . diphenoxylate-atropine (LOMOTIL) 2.5-0.025 MG tablet Take 1 tablet by mouth 4 (four) times daily  as needed for diarrhea or loose stools. May take qid if imodium is not working. 30 tablet 0  . ELIQUIS 2.5 MG TABS tablet Take 2.5 mg by mouth 2 (two) times daily.  3  . folic acid (FOLVITE) 1 MG tablet Take 1 mg by mouth daily.      . furosemide (LASIX) 40 MG tablet Take 40 mg by mouth daily.     Marland Kitchen gabapentin (NEURONTIN) 300 MG capsule Take 1 capsule (300 mg total) by mouth as directed. Take 2 capsules in am ( total 600 mg) , one capsule (300 mg)  in the afternoon and 2  capsules (600 mg) hs 450 capsule 0  . levothyroxine (SYNTHROID, LEVOTHROID) 137 MCG tablet Take 137 mcg by mouth daily before breakfast.    . lidocaine-prilocaine (EMLA) cream Apply 1 application topically as needed. 30 g 1  . Omega-3 Fatty Acids (FISH OIL) 1200 MG CAPS Take 1,200 mg by mouth 2 (two) times daily.     Marland Kitchen oxyCODONE (OXY IR/ROXICODONE) 5 MG immediate release tablet Take 1 tablet (5 mg total) by mouth every 4 (four) hours as needed for severe pain. 30 tablet 0  . potassium chloride SA (K-DUR,KLOR-CON) 20 MEQ tablet Take 40 mEq by mouth 2 (two) times daily.     . prochlorperazine (COMPAZINE) 5 MG tablet Take 1 tablet (5 mg total) by mouth every 6 (six) hours as needed for nausea or vomiting. 30 tablet 0  . sucralfate (CARAFATE) 1 g tablet Take 1 tablet (1 g total) by mouth 4 (four) times daily -  with meals and at bedtime. Crush tablet and dissolve in 6 oz of water. 120 tablet 1  . vitamin B-12 (CYANOCOBALAMIN) 1000 MCG tablet Take 1,000 mcg by mouth daily.       No current facility-administered medications for this visit.     SURGICAL HISTORY:  Past Surgical History:  Procedure Laterality Date  . ABDOMINAL HYSTERECTOMY    . BLADDER SUSPENSION    . BREAST LUMPECTOMY Left    lumpectomy  . CARPAL TUNNEL RELEASE     left wrist  . CHOLECYSTECTOMY    . FINGER SURGERY    . IR FLUORO GUIDE PORT INSERTION RIGHT  03/12/2017  . IR US GUIDE VASC ACCESS RIGHT  03/12/2017  . PERMANENT PACEMAKER INSERTION N/A 12/04/2011   Procedure: PERMANENT PACEMAKER INSERTION;  Surgeon: Evans Lance, MD;  Location: [facility] CATH LAB;  Service: Cardiovascular;  Laterality: N/A;  . SHOULDER SURGERY    . TONSILLECTOMY AND ADENOIDECTOMY      REVIEW OF SYSTEMS:  Constitutional: positive for fatigue Eyes: negative Ears, nose, mouth, throat, and face: negative Respiratory: positive for cough and dyspnea on exertion Cardiovascular: negative Gastrointestinal: negative Genitourinary:negative Integument/breast:  negative Hematologic/lymphatic: negative Musculoskeletal:positive for bone pain and muscle weakness Neurological: negative Behavioral/Psych: negative Endocrine: negative Allergic/Immunologic: negative   PHYSICAL EXAMINATION: General appearance: alert, cooperative, fatigued and no distress Head: Normocephalic, without obvious abnormality, atraumatic Neck: no adenopathy, no JVD, supple, symmetrical, trachea midline and thyroid not enlarged, symmetric, no tenderness/mass/nodules Lymph nodes: Cervical, supraclavicular, and axillary nodes normal. Resp: diminished breath sounds RLL and dullness to percussion RLL Back: symmetric, no curvature. ROM normal. No CVA tenderness. Cardio: regular rate and rhythm, S1, S2 normal, no murmur, click, rub or gallop GI: soft, non-tender; bowel sounds normal; no masses,  no organomegaly Extremities: extremities normal, atraumatic, no cyanosis or edema Neurologic: Alert and oriented X 3, normal strength and tone. Normal symmetric reflexes. Normal coordination and gait  ECOG PERFORMANCE  STATUS: 1 - Symptomatic but completely ambulatory  Blood pressure 127/66, pulse 74, temperature 98.5 F (36.9 C), temperature source Oral, resp. rate 18, height 5' (1.524 m), weight 143 lb 9.6 oz (65.1 kg), SpO2 100 %.  LABORATORY DATA: Lab Results  Component Value Date   WBC 6.4 07/08/2018   HGB 10.5 (L) 07/08/2018   HCT 33.3 (L) 07/08/2018   MCV 99.7 07/08/2018   PLT 199 07/08/2018      Chemistry      Component Value Date/Time   NA 140 06/25/2018 0850   NA 141 11/03/2017 0834   K 4.1 06/25/2018 0850   K 3.9 11/03/2017 0834   CL 107 06/25/2018 0850   CO2 25 06/25/2018 0850   CO2 23 11/03/2017 0834   BUN 9 06/25/2018 0850   BUN 10.1 11/03/2017 0834   CREATININE 0.83 06/25/2018 0850   CREATININE 0.8 11/03/2017 0834      Component Value Date/Time   CALCIUM 9.3 06/25/2018 0850   CALCIUM 9.6 11/03/2017 0834   ALKPHOS 61 06/25/2018 0850   ALKPHOS 61 11/03/2017  0834   AST 119 (H) 06/25/2018 0850   AST 13 11/03/2017 0834   ALT 118 (H) 06/25/2018 0850   ALT 11 11/03/2017 0834   BILITOT 0.3 06/25/2018 0850   BILITOT 0.31 11/03/2017 0834       RADIOGRAPHIC STUDIES: Ct Chest W Contrast  Result Date: 07/06/2018 CLINICAL DATA:  84 year old female with history of right-sided lung cancer diagnosed in 2018 status post radiation therapy and chemotherapy which is now complete. Follow-up study. EXAM: CT CHEST, ABDOMEN, AND PELVIS WITH CONTRAST TECHNIQUE: Multidetector CT imaging of the chest, abdomen and pelvis was performed following the standard protocol during bolus administration of intravenous contrast. CONTRAST:  126m ISOVUE-300 IOPAMIDOL (ISOVUE-300) INJECTION 61% COMPARISON:  CT the chest, abdomen and pelvis 04/08/2018. FINDINGS: CT CHEST FINDINGS Cardiovascular: Heart size is borderline enlarged. There is no significant pericardial fluid, thickening or pericardial calcification. There is aortic atherosclerosis, as well as atherosclerosis of the great vessels of the mediastinum and the coronary arteries, including calcified atherosclerotic plaque in the left anterior descending and left circumflex coronary arteries. Right internal jugular single-lumen porta cath with tip terminating in the mid superior vena cava. Left-sided pacemaker with lead tips terminating in the right atrial appendage and right ventricular apex. Mediastinum/Nodes: Prevascular lymph node has regressed, currently measuring only 7 mm in short axis. Persistent enlargement of AP window lymph node measuring 19 mm in short axis (axial image 23 of series 2), decreased from 2.7 cm on the prior. No other definite pathologically enlarged mediastinal or hilar lymph nodes. Esophagus is unremarkable in appearance. No axillary lymphadenopathy. Surgical clips in the left axilla from prior lymph node dissection. Lungs/Pleura: Again noted is a large centrally cavitary mass-like area of architectural  distortion in the posterior aspect of the upper right lung involving portions of both the right upper lobe as well as the superior segment of the right lower lobe, currently measuring 3.7 x 7.1 cm (axial image 20 of series 2). Internal contents of this cavitary areas appear very ill-defined, likely to reflect partially sloughed necrotic tissue. No definite mycetoma identified at this time. There is a small amount of layering fluid dependently. In addition, in the medial and anterior aspect of the left upper lobe there is a similar appearing area of architectural distortion which appears more confluent and mass-like than the prior examination, most compatible with a region of postradiation mass-like fibrosis. Multiple tiny 2-3 mm pulmonary nodules are  noted scattered throughout the periphery of the lungs bilaterally, as well as in subpleural locations, nonspecific but unchanged compared to the prior examination. No other larger new suspicious appearing pulmonary nodules or masses. No acute consolidative airspace disease. No pleural effusions. Musculoskeletal: There are no aggressive appearing lytic or blastic lesions noted in the visualized portions of the skeleton. CT ABDOMEN PELVIS FINDINGS Hepatobiliary: No suspicious cystic or solid hepatic lesions. Status post cholecystectomy. Moderate intra and extrahepatic biliary ductal dilatation, unchanged compared to the prior examination. Common bile duct measures up to 12 mm in the porta hepatis. No definite calcified choledocholithiasis. Pancreas: No pancreatic mass. No pancreatic ductal dilatation. No pancreatic or peripancreatic fluid or inflammatory changes. Spleen: Again noted are innumerable subcentimeter low-attenuation lesions scattered throughout the spleen, too small to characterize but similar to prior studies, favored to represent benign lesions. Adrenals/Urinary Tract: 5.4 cm simple cyst in the upper pole of the left kidney. Small calyceal diverticulum with  dependent calculus in the interpolar region of the left kidney (axial image 69 of series 2). Right kidney and bilateral adrenal glands are normal in appearance. No hydroureteronephrosis. Urinary bladder is normal in appearance. Stomach/Bowel: Normal appearance of the stomach. No pathologic dilatation of small bowel or colon. The appendix is not confidently identified and may be surgically absent. Regardless, there are no inflammatory changes noted adjacent to the cecum to suggest the presence of an acute appendicitis at this time. Vascular/Lymphatic: Aortic atherosclerosis with areas of fusiform ectasia of the infrarenal abdominal aorta which measure up to 2.0 x 2.3 cm. No lymphadenopathy noted in the abdomen or pelvis. Reproductive: Status post hysterectomy. Ovaries are not confidently identified may be surgically absent or atrophic. Other: Bilateral inguinal hernias containing only fat. No significant volume of ascites. No pneumoperitoneum. Musculoskeletal: There are no aggressive appearing lytic or blastic lesions noted in the visualized portions of the skeleton. IMPRESSION: 1. Evolving postradiation changes in the lungs bilaterally, as above. The possibility of residual disease in the right lung is not excluded, however, the findings may simply reflect evolving centrally cavitary/necrotic postradiation mass-like fibrosis. Continued attention on follow-up studies is recommended. 2. Malignant lymphadenopathy in the mediastinum has regressed, with persistent enlarged AP window lymph node which has decreased in size compared to the prior study. 3. No new signs of metastatic disease noted in the abdomen or pelvis. 4. Additional incidental findings, as above. Electronically Signed   By: Vinnie Langton M.D.   On: 07/06/2018 16:38   Ct Abdomen Pelvis W Contrast  Result Date: 07/06/2018 CLINICAL DATA:  84 year old female with history of right-sided lung cancer diagnosed in 2018 status post radiation therapy and  chemotherapy which is now complete. Follow-up study. EXAM: CT CHEST, ABDOMEN, AND PELVIS WITH CONTRAST TECHNIQUE: Multidetector CT imaging of the chest, abdomen and pelvis was performed following the standard protocol during bolus administration of intravenous contrast. CONTRAST:  151m ISOVUE-300 IOPAMIDOL (ISOVUE-300) INJECTION 61% COMPARISON:  CT the chest, abdomen and pelvis 04/08/2018. FINDINGS: CT CHEST FINDINGS Cardiovascular: Heart size is borderline enlarged. There is no significant pericardial fluid, thickening or pericardial calcification. There is aortic atherosclerosis, as well as atherosclerosis of the great vessels of the mediastinum and the coronary arteries, including calcified atherosclerotic plaque in the left anterior descending and left circumflex coronary arteries. Right internal jugular single-lumen porta cath with tip terminating in the mid superior vena cava. Left-sided pacemaker with lead tips terminating in the right atrial appendage and right ventricular apex. Mediastinum/Nodes: Prevascular lymph node has regressed, currently measuring only 7 mm  in short axis. Persistent enlargement of AP window lymph node measuring 19 mm in short axis (axial image 23 of series 2), decreased from 2.7 cm on the prior. No other definite pathologically enlarged mediastinal or hilar lymph nodes. Esophagus is unremarkable in appearance. No axillary lymphadenopathy. Surgical clips in the left axilla from prior lymph node dissection. Lungs/Pleura: Again noted is a large centrally cavitary mass-like area of architectural distortion in the posterior aspect of the upper right lung involving portions of both the right upper lobe as well as the superior segment of the right lower lobe, currently measuring 3.7 x 7.1 cm (axial image 20 of series 2). Internal contents of this cavitary areas appear very ill-defined, likely to reflect partially sloughed necrotic tissue. No definite mycetoma identified at this time. There  is a small amount of layering fluid dependently. In addition, in the medial and anterior aspect of the left upper lobe there is a similar appearing area of architectural distortion which appears more confluent and mass-like than the prior examination, most compatible with a region of postradiation mass-like fibrosis. Multiple tiny 2-3 mm pulmonary nodules are noted scattered throughout the periphery of the lungs bilaterally, as well as in subpleural locations, nonspecific but unchanged compared to the prior examination. No other larger new suspicious appearing pulmonary nodules or masses. No acute consolidative airspace disease. No pleural effusions. Musculoskeletal: There are no aggressive appearing lytic or blastic lesions noted in the visualized portions of the skeleton. CT ABDOMEN PELVIS FINDINGS Hepatobiliary: No suspicious cystic or solid hepatic lesions. Status post cholecystectomy. Moderate intra and extrahepatic biliary ductal dilatation, unchanged compared to the prior examination. Common bile duct measures up to 12 mm in the porta hepatis. No definite calcified choledocholithiasis. Pancreas: No pancreatic mass. No pancreatic ductal dilatation. No pancreatic or peripancreatic fluid or inflammatory changes. Spleen: Again noted are innumerable subcentimeter low-attenuation lesions scattered throughout the spleen, too small to characterize but similar to prior studies, favored to represent benign lesions. Adrenals/Urinary Tract: 5.4 cm simple cyst in the upper pole of the left kidney. Small calyceal diverticulum with dependent calculus in the interpolar region of the left kidney (axial image 69 of series 2). Right kidney and bilateral adrenal glands are normal in appearance. No hydroureteronephrosis. Urinary bladder is normal in appearance. Stomach/Bowel: Normal appearance of the stomach. No pathologic dilatation of small bowel or colon. The appendix is not confidently identified and may be surgically absent.  Regardless, there are no inflammatory changes noted adjacent to the cecum to suggest the presence of an acute appendicitis at this time. Vascular/Lymphatic: Aortic atherosclerosis with areas of fusiform ectasia of the infrarenal abdominal aorta which measure up to 2.0 x 2.3 cm. No lymphadenopathy noted in the abdomen or pelvis. Reproductive: Status post hysterectomy. Ovaries are not confidently identified may be surgically absent or atrophic. Other: Bilateral inguinal hernias containing only fat. No significant volume of ascites. No pneumoperitoneum. Musculoskeletal: There are no aggressive appearing lytic or blastic lesions noted in the visualized portions of the skeleton. IMPRESSION: 1. Evolving postradiation changes in the lungs bilaterally, as above. The possibility of residual disease in the right lung is not excluded, however, the findings may simply reflect evolving centrally cavitary/necrotic postradiation mass-like fibrosis. Continued attention on follow-up studies is recommended. 2. Malignant lymphadenopathy in the mediastinum has regressed, with persistent enlarged AP window lymph node which has decreased in size compared to the prior study. 3. No new signs of metastatic disease noted in the abdomen or pelvis. 4. Additional incidental findings, as above. Electronically  Signed   By: Vinnie Langton M.D.   On: 07/06/2018 16:38    ASSESSMENT AND PLAN:  This is a very pleasant 84 years old white female with a stage IV non-small cell lung cancer, adenocarcinoma with PDL 1 expression of 5%. The patient underwent systemic chemotherapy with carboplatin and paclitaxel, status post 5 cycles and has been tolerating her treatment well except for increasing fatigue and weakness as well as development of peripheral neuropathy. Last dose of chemotherapy was in July 2018.   The patient was a started on treatment with Keytruda status post 3 cycles discontinued secondary to disease progression. She was found to  have disease progression. The patient was a started on systemic chemotherapy with gemcitabine 1000 mg/M2 on days 1 and 8 status post 3 cycles.  She has been tolerating this treatment well with no concerning complaints. She had repeat CT scan of the chest, abdomen and pelvis performed recently. I personally and independently reviewed the scans and discussed the results with the patient and her family.  Her scan showed no concerning findings for disease progression and there was improvement of the mediastinal lymph nodes. I recommended for the patient to continue her current treatment with gemcitabine and she will start cycle #4 today. For pain management I will start the patient on fentanyl patch 25 mcg/hour every 3 days and she will continue on Percocet for breakthrough pain as needed. For the mild skin rash on the face and arms, I will start the patient hydrocortisone cream topically. She will come back for follow-up visit in 3 weeks for evaluation before the next cycle of her treatment. The patient was advised to call immediately if she has any concerning symptoms in the interval. The patient voices understanding of current disease status and treatment options and is in agreement with the current care plan. All questions were answered. The patient knows to call the clinic with any problems, questions or concerns. We can certainly see the patient much sooner if necessary.  Disclaimer: This note was dictated with voice recognition software. Similar sounding words can inadvertently be transcribed and may not be corrected upon review.

## 2018-07-08 NOTE — Patient Instructions (Signed)
[facility] Discharge Instructions for Patients Receiving Chemotherapy  Today you received the following chemotherapy agents:  Gemcitabine  To help prevent nausea and vomiting after your treatment, we encourage you to take your nausea medication as prescribed.    If you develop nausea and vomiting that is not controlled by your nausea medication, call the clinic.   BELOW ARE SYMPTOMS THAT SHOULD BE REPORTED IMMEDIATELY:  *FEVER GREATER THAN 100.5 F  *CHILLS WITH OR WITHOUT FEVER  NAUSEA AND VOMITING THAT IS NOT CONTROLLED WITH YOUR NAUSEA MEDICATION  *UNUSUAL SHORTNESS OF BREATH  *UNUSUAL BRUISING OR BLEEDING  TENDERNESS IN MOUTH AND THROAT WITH OR WITHOUT PRESENCE OF ULCERS  *URINARY PROBLEMS  *BOWEL PROBLEMS  UNUSUAL RASH Items with * indicate a potential emergency and should be followed up as soon as possible.  Feel free to call the clinic should you have any questions or concerns. The clinic phone number is [phone].  Please show the CHEMO ALERT CARD at check-in to the Emergency Department and triage nurse.   

## 2018-07-13 ENCOUNTER — Other Ambulatory Visit: Payer: Self-pay | Admitting: Internal Medicine

## 2018-07-15 ENCOUNTER — Inpatient Hospital Stay: Payer: Medicare Other

## 2018-07-15 ENCOUNTER — Telehealth: Payer: Self-pay | Admitting: Medical Oncology

## 2018-07-15 ENCOUNTER — Inpatient Hospital Stay: Payer: Medicare Other | Attending: Internal Medicine

## 2018-07-15 ENCOUNTER — Inpatient Hospital Stay: Payer: Medicare Other | Admitting: Nutrition

## 2018-07-15 VITALS — BP 125/61 | HR 78 | Temp 98.6°F | Resp 18

## 2018-07-15 DIAGNOSIS — C3491 Malignant neoplasm of unspecified part of right bronchus or lung: Secondary | ICD-10-CM

## 2018-07-15 DIAGNOSIS — G620 Drug-induced polyneuropathy: Secondary | ICD-10-CM | POA: Insufficient documentation

## 2018-07-15 DIAGNOSIS — Z95828 Presence of other vascular implants and grafts: Secondary | ICD-10-CM

## 2018-07-15 DIAGNOSIS — C3411 Malignant neoplasm of upper lobe, right bronchus or lung: Secondary | ICD-10-CM | POA: Insufficient documentation

## 2018-07-15 DIAGNOSIS — Z5111 Encounter for antineoplastic chemotherapy: Secondary | ICD-10-CM | POA: Insufficient documentation

## 2018-07-15 LAB — CMP (CANCER CENTER ONLY)
ALBUMIN: 2.6 g/dL — AB (ref 3.5–5.0)
ALK PHOS: 59 U/L (ref 38–126)
ALT: 43 U/L (ref 0–44)
ANION GAP: 10 (ref 5–15)
AST: 43 U/L — ABNORMAL HIGH (ref 15–41)
BILIRUBIN TOTAL: 0.3 mg/dL (ref 0.3–1.2)
BUN: 7 mg/dL — ABNORMAL LOW (ref 8–23)
CALCIUM: 8.7 mg/dL — AB (ref 8.9–10.3)
CO2: 23 mmol/L (ref 22–32)
Chloride: 109 mmol/L (ref 98–111)
Creatinine: 0.88 mg/dL (ref 0.44–1.00)
GFR, EST NON AFRICAN AMERICAN: 57 mL/min — AB (ref >60)
GFR, Est AFR Am: >60 mL/min (ref >60)
GLUCOSE: 164 mg/dL — AB (ref 70–99)
Potassium: 4 mmol/L (ref 3.5–5.1)
Sodium: 142 mmol/L (ref 135–145)
TOTAL PROTEIN: 6.3 g/dL — AB (ref 6.5–8.1)

## 2018-07-15 LAB — CBC WITH DIFFERENTIAL (CANCER CENTER ONLY)
Basophils Absolute: 0 10*3/uL (ref 0.0–0.1)
Basophils Relative: 0 %
EOS ABS: 0 10*3/uL (ref 0.0–0.5)
Eosinophils Relative: 2 %
HCT: 29.8 % — ABNORMAL LOW (ref 34.8–46.6)
HEMOGLOBIN: 9.8 g/dL — AB (ref 11.6–15.9)
Lymphocytes Relative: 19 %
Lymphs Abs: 0.5 10*3/uL — ABNORMAL LOW (ref 0.9–3.3)
MCH: 32.3 pg (ref 25.1–34.0)
MCHC: 32.9 g/dL (ref 31.5–36.0)
MCV: 98.3 fL (ref 79.5–101.0)
MONO ABS: 0.5 10*3/uL (ref 0.1–0.9)
MONOS PCT: 18 %
NEUTROS PCT: 61 %
Neutro Abs: 1.6 10*3/uL (ref 1.5–6.5)
Platelet Count: 110 10*3/uL — ABNORMAL LOW (ref 145–400)
RBC: 3.03 MIL/uL — AB (ref 3.70–5.45)
RDW: 20.2 % — AB (ref 11.2–14.5)
WBC: 2.5 10*3/uL — AB (ref 3.9–10.3)

## 2018-07-15 MED ORDER — PROCHLORPERAZINE MALEATE 10 MG PO TABS
ORAL_TABLET | ORAL | Status: AC
Start: 2018-07-15 — End: ?
  Filled 2018-07-15: qty 1

## 2018-07-15 MED ORDER — SODIUM CHLORIDE 0.9% FLUSH
10.0000 mL | Freq: Once | INTRAVENOUS | Status: AC
  Administered 2018-07-15: 10 mL
  Filled 2018-07-15: qty 10

## 2018-07-15 MED ORDER — SODIUM CHLORIDE 0.9 % IV SOLN
Freq: Once | INTRAVENOUS | Status: AC
  Administered 2018-07-15: 14:00:00 via INTRAVENOUS
  Filled 2018-07-15: qty 250

## 2018-07-15 MED ORDER — SODIUM CHLORIDE 0.9 % IV SOLN
1000.0000 mg/m2 | Freq: Once | INTRAVENOUS | Status: AC
  Administered 2018-07-15: 1672 mg via INTRAVENOUS
  Filled 2018-07-15: qty 43.97

## 2018-07-15 MED ORDER — PROCHLORPERAZINE MALEATE 10 MG PO TABS
10.0000 mg | ORAL_TABLET | Freq: Once | ORAL | Status: AC
  Administered 2018-07-15: 10 mg via ORAL

## 2018-07-15 MED ORDER — HEPARIN SOD (PORK) LOCK FLUSH 100 UNIT/ML IV SOLN
500.0000 [IU] | Freq: Once | INTRAVENOUS | Status: AC | PRN
  Administered 2018-07-15: 500 [IU]
  Filled 2018-07-15: qty 5

## 2018-07-15 MED ORDER — SODIUM CHLORIDE 0.9% FLUSH
10.0000 mL | INTRAVENOUS | Status: DC | PRN
  Administered 2018-07-15: 10 mL
  Filled 2018-07-15: qty 10

## 2018-07-15 NOTE — Patient Instructions (Signed)
[facility] °Discharge Instructions for Patients Receiving Chemotherapy ° °Today you received the following chemotherapy agents Gemzar ° °To help prevent nausea and vomiting after your treatment, we encourage you to take your nausea medication as directed. °  °If you develop nausea and vomiting that is not controlled by your nausea medication, call the clinic.  ° °BELOW ARE SYMPTOMS THAT SHOULD BE REPORTED IMMEDIATELY: °· *FEVER GREATER THAN 100.5 F °· *CHILLS WITH OR WITHOUT FEVER °· NAUSEA AND VOMITING THAT IS NOT CONTROLLED WITH YOUR NAUSEA MEDICATION °· *UNUSUAL SHORTNESS OF BREATH °· *UNUSUAL BRUISING OR BLEEDING °· TENDERNESS IN MOUTH AND THROAT WITH OR WITHOUT PRESENCE OF ULCERS °· *URINARY PROBLEMS °· *BOWEL PROBLEMS °· UNUSUAL RASH °Items with * indicate a potential emergency and should be followed up as soon as possible. ° °Feel free to call the clinic should you have any questions or concerns. The clinic phone number is [phone]. ° °Please show the CHEMO ALERT CARD at check-in to the Emergency Department and triage nurse. ° ° °

## 2018-07-15 NOTE — Progress Notes (Signed)
Nutrition follow-up completed with patient receiving chemotherapy for lung cancer. Patient reports her appetite has improved and she is eating well. Weight improved and documented as 143.6 pounds  July 31, increased from 140 pounds July 10. Patient denies nutrition impact symptoms. Reports she rarely drinks oral nutrition supplements. Patient continues to cook a big family dinner on Sunday. It appears she is eating a variety of foods.  Nutrition diagnosis: Unintentional weight loss improved.  Intervention: Educated patient to continue strategies for adequate calories and protein to minimize weight loss. Encouraged her to contact me with any further questions or concerns. She has our contact information.  Monitoring, evaluation, goals: Patient will maintain or gain weight.  No follow-up required at this time.  **Disclaimer: This note was dictated with voice recognition software. Similar sounding words can inadvertently be transcribed and this note may contain transcription errors which may not have been corrected upon publication of note.**

## 2018-07-15 NOTE — Telephone Encounter (Signed)
Pt asked me to call [facility] due to med issue. Pt left rx at pharmacy. Per pharmacy PA needed.

## 2018-07-25 ENCOUNTER — Other Ambulatory Visit: Payer: Self-pay | Admitting: Internal Medicine

## 2018-07-25 DIAGNOSIS — C3491 Malignant neoplasm of unspecified part of right bronchus or lung: Secondary | ICD-10-CM

## 2018-07-25 DIAGNOSIS — G629 Polyneuropathy, unspecified: Secondary | ICD-10-CM

## 2018-07-28 ENCOUNTER — Inpatient Hospital Stay (HOSPITAL_BASED_OUTPATIENT_CLINIC_OR_DEPARTMENT_OTHER): Payer: Medicare Other | Admitting: Oncology

## 2018-07-28 ENCOUNTER — Inpatient Hospital Stay: Payer: Medicare Other

## 2018-07-28 ENCOUNTER — Encounter: Payer: Self-pay | Admitting: Oncology

## 2018-07-28 VITALS — BP 118/59 | HR 79 | Temp 97.9°F | Resp 18 | Ht 60.0 in | Wt 147.0 lb

## 2018-07-28 DIAGNOSIS — Z5111 Encounter for antineoplastic chemotherapy: Secondary | ICD-10-CM

## 2018-07-28 DIAGNOSIS — C3411 Malignant neoplasm of upper lobe, right bronchus or lung: Secondary | ICD-10-CM | POA: Diagnosis not present

## 2018-07-28 DIAGNOSIS — G620 Drug-induced polyneuropathy: Secondary | ICD-10-CM | POA: Diagnosis not present

## 2018-07-28 DIAGNOSIS — C3491 Malignant neoplasm of unspecified part of right bronchus or lung: Secondary | ICD-10-CM

## 2018-07-28 DIAGNOSIS — Z95828 Presence of other vascular implants and grafts: Secondary | ICD-10-CM

## 2018-07-28 DIAGNOSIS — C3490 Malignant neoplasm of unspecified part of unspecified bronchus or lung: Secondary | ICD-10-CM

## 2018-07-28 LAB — CBC WITH DIFFERENTIAL (CANCER CENTER ONLY)
Basophils Absolute: 0 10*3/uL (ref 0.0–0.1)
Basophils Relative: 0 %
Eosinophils Absolute: 0.1 10*3/uL (ref 0.0–0.5)
Eosinophils Relative: 2 %
HEMATOCRIT: 27.6 % — AB (ref 34.8–46.6)
HEMOGLOBIN: 9.2 g/dL — AB (ref 11.6–15.9)
LYMPHS ABS: 0.3 10*3/uL — AB (ref 0.9–3.3)
LYMPHS PCT: 7 %
MCH: 34 pg (ref 25.1–34.0)
MCHC: 33.3 g/dL (ref 31.5–36.0)
MCV: 102.1 fL — AB (ref 79.5–101.0)
MONOS PCT: 16 %
Monocytes Absolute: 0.7 10*3/uL (ref 0.1–0.9)
NEUTROS PCT: 75 %
Neutro Abs: 3.2 10*3/uL (ref 1.5–6.5)
Platelet Count: 167 10*3/uL (ref 145–400)
RBC: 2.7 MIL/uL — AB (ref 3.70–5.45)
RDW: 24.2 % — ABNORMAL HIGH (ref 11.2–14.5)
WBC: 4.3 10*3/uL (ref 3.9–10.3)

## 2018-07-28 LAB — CMP (CANCER CENTER ONLY)
ALT: 33 U/L (ref 0–44)
AST: 27 U/L (ref 15–41)
Albumin: 2.8 g/dL — ABNORMAL LOW (ref 3.5–5.0)
Alkaline Phosphatase: 65 U/L (ref 38–126)
Anion gap: 8 (ref 5–15)
BUN: 9 mg/dL (ref 8–23)
CHLORIDE: 108 mmol/L (ref 98–111)
CO2: 24 mmol/L (ref 22–32)
CREATININE: 0.86 mg/dL (ref 0.44–1.00)
Calcium: 8.6 mg/dL — ABNORMAL LOW (ref 8.9–10.3)
GFR, EST NON AFRICAN AMERICAN: 59 mL/min — AB (ref >60)
GFR, Est AFR Am: >60 mL/min (ref >60)
Glucose, Bld: 169 mg/dL — ABNORMAL HIGH (ref 70–99)
POTASSIUM: 4.4 mmol/L (ref 3.5–5.1)
SODIUM: 140 mmol/L (ref 135–145)
Total Bilirubin: 0.3 mg/dL (ref 0.3–1.2)
Total Protein: 6.3 g/dL — ABNORMAL LOW (ref 6.5–8.1)

## 2018-07-28 MED ORDER — OXYCODONE HCL 5 MG PO TABS: 5.0000 mg | ORAL_TABLET | ORAL | 0 refills | Status: DC | PRN

## 2018-07-28 MED ORDER — SODIUM CHLORIDE 0.9 % IV SOLN
Freq: Once | INTRAVENOUS | Status: AC
  Administered 2018-07-28: 12:00:00 via INTRAVENOUS
  Filled 2018-07-28: qty 250

## 2018-07-28 MED ORDER — PROCHLORPERAZINE MALEATE 10 MG PO TABS
ORAL_TABLET | ORAL | Status: AC
  Filled 2018-07-28: qty 1

## 2018-07-28 MED ORDER — SODIUM CHLORIDE 0.9% FLUSH
10.0000 mL | INTRAVENOUS | Status: DC | PRN
  Administered 2018-07-28: 10 mL
  Filled 2018-07-28: qty 10

## 2018-07-28 MED ORDER — SODIUM CHLORIDE 0.9 % IV SOLN
1000.0000 mg/m2 | Freq: Once | INTRAVENOUS | Status: AC
  Administered 2018-07-28: 1672 mg via INTRAVENOUS
  Filled 2018-07-28: qty 43.97

## 2018-07-28 MED ORDER — PROCHLORPERAZINE MALEATE 10 MG PO TABS
10.0000 mg | ORAL_TABLET | Freq: Once | ORAL | Status: AC
  Administered 2018-07-28: 10 mg via ORAL

## 2018-07-28 MED ORDER — HEPARIN SOD (PORK) LOCK FLUSH 100 UNIT/ML IV SOLN
500.0000 [IU] | Freq: Once | INTRAVENOUS | Status: AC | PRN
  Administered 2018-07-28: 500 [IU]
  Filled 2018-07-28: qty 5

## 2018-07-28 MED ORDER — SODIUM CHLORIDE 0.9% FLUSH
10.0000 mL | Freq: Once | INTRAVENOUS | Status: AC
  Administered 2018-07-28: 10 mL
  Filled 2018-07-28: qty 10

## 2018-07-28 NOTE — Progress Notes (Signed)
[facility] OFFICE PROGRESS NOTE  Lajean Manes, MD [address] [place] [address]  DIAGNOSIS: Stage IV (T2b, N3, M1 a) non-small cell lung cancer, squamous cell carcinoma presented with large right upper lobe lung mass in addition to bilateral mediastinal lymphadenopathy as well as left upper lobe lung nodule diagnosed in March 2018. PDL 1 expression is 5%.   PRIOR THERAPY:  1) Systemic chemotherapy with carboplatin for AUC of 5 and paclitaxel 175 MG/M2 every 3 weeks. First dose 03/24/2017. Status post 5 cycles. Last cycle was given 06/18/2017. Discontinued secondary to intolerance and significant peripheral neuropathy. 2) Second line treatment with immunotherapy with Keytruda 200 mg IV every 3 weeks.  First dose February 17, 2018.  Status post 3 cycles. 3) palliative radiotherapy to the large right upper lobe lung mass.  CURRENT THERAPY: Systemic chemotherapy with gemcitabine 1000 mg/M2 on days 1 and 8 every 3 weeks.  First dose May 06, 2018.  Status post 4 cycles.  INTERVAL HISTORY: Maria Washington 84 y.o. female returns for routine follow-up visit accompanied by her daughter.  The patient is feeling fine today with exception of persistent aching pain.  Pain is primarily located in her right shoulder.  Has difficulty raising her arms due to the pain.  She is using oxycodone 3-4 times a day.  She was prescribed a fentanyl patch at her last visit but has been unable to obtain this due to the prior authorization not being completed.  The patient denies fevers and chills.  Denies chest pain, shortness of breath, cough, hemoptysis.  Denies nausea, vomiting, constipation, diarrhea.  Denies recent weight loss or night sweats.  She continues to tolerate treatment with gemcitabine fairly well.  The patient is here for evaluation prior to cycle #5 over treatment.  MEDICAL HISTORY: Past Medical History:  Diagnosis Date  . Aortic atherosclerosis ([place]) 02/10/2017  .  Arthritis   . CAD (coronary artery disease), native coronary artery    3 vessel calcification noted on CT scan   . Cardiac pacemaker in situ 12/05/2011  . Chronic diastolic congestive heart failure ([place])   . COPD (chronic obstructive pulmonary disease) ([facility])    spot on lung  . Dehydration 06/04/2017  . Encounter for antineoplastic chemotherapy 03/04/2017  . Goals of care, counseling/discussion 03/17/2017  . History of breast cancer    Treated with lumpectomy, radiation, and chemo sees Dr. Ralene Ok   . Hypertensive heart disease without CHF   . Hypokalemia 04/18/2017  . Hypothyroid   . Mitral regurgitation   . Paroxysmal atrial fibrillation ([facility])    CHA2DS2VASC score 5  . Personal history of chemotherapy   . Personal history of radiation therapy   . Second degree heart block   . Stage IV squamous cell carcinoma of right lung ([place]) 03/04/2017    ALLERGIES:  is allergic to clindamycin/lincomycin and penicillins.  MEDICATIONS:  Current Outpatient Medications  Medication Sig Dispense Refill  . alendronate (FOSAMAX) 70 MG tablet Take 70 mg by mouth every Monday.     . calcium-vitamin D (OSCAL WITH D) 500-200 MG-UNIT per tablet Take 1 tablet by mouth 2 (two) times daily.      . cholecalciferol (VITAMIN D) 1000 units tablet Take 1,000 Units by mouth daily.     Marland Kitchen diltiazem (CARDIZEM CD) 120 MG 24 hr capsule Take 120 mg by mouth daily.  3  . diphenoxylate-atropine (LOMOTIL) 2.5-0.025 MG tablet Take 1 tablet by mouth 4 (four) times daily as needed for  diarrhea or loose stools. May take qid if imodium is not working. 30 tablet 0  . ELIQUIS 2.5 MG TABS tablet Take 2.5 mg by mouth 2 (two) times daily.  3  . folic acid (FOLVITE) 1 MG tablet Take 1 mg by mouth daily.      . furosemide (LASIX) 40 MG tablet Take 40 mg by mouth daily.     Marland Kitchen gabapentin (NEURONTIN) 300 MG capsule TAKE 2 CAPSULES BY MOUTH EVERY MORNING, 1 CAPSULE AT NOON AND 2 CAPSULES EVERY EVENING 450 capsule 0  . hydrocortisone 1 %  lotion Apply 1 application topically 2 (two) times daily. 118 mL 0  . levothyroxine (SYNTHROID, LEVOTHROID) 137 MCG tablet Take 137 mcg by mouth daily before breakfast.    . lidocaine-prilocaine (EMLA) cream Apply 1 application topically as needed. 30 g 1  . Omega-3 Fatty Acids (FISH OIL) 1200 MG CAPS Take 1,200 mg by mouth 2 (two) times daily.     Marland Kitchen oxyCODONE (OXY IR/ROXICODONE) 5 MG immediate release tablet Take 1 tablet (5 mg total) by mouth every 4 (four) hours as needed for severe pain. 30 tablet 0  . potassium chloride SA (K-DUR,KLOR-CON) 20 MEQ tablet Take 40 mEq by mouth 2 (two) times daily.     . prochlorperazine (COMPAZINE) 5 MG tablet Take 1 tablet (5 mg total) by mouth every 6 (six) hours as needed for nausea or vomiting. 30 tablet 0  . sucralfate (CARAFATE) 1 g tablet Take 1 tablet (1 g total) by mouth 4 (four) times daily -  with meals and at bedtime. Crush tablet and dissolve in 6 oz of water. 120 tablet 1  . vitamin B-12 (CYANOCOBALAMIN) 1000 MCG tablet Take 1,000 mcg by mouth daily.      . fentaNYL (DURAGESIC - DOSED MCG/HR) 25 MCG/HR patch Place 1 patch (25 mcg total) onto the skin every 3 (three) days. (Patient not taking: Reported on 07/28/2018) 5 patch 0   No current facility-administered medications for this visit.    Facility-Administered Medications Ordered in Other Visits  Medication Dose Route Frequency Provider Last Rate Last Dose  . sodium chloride flush (NS) 0.9 % injection 10 mL  10 mL Intracatheter PRN Curt Bears, MD   10 mL at 07/28/18 1342    SURGICAL HISTORY:  Past Surgical History:  Procedure Laterality Date  . ABDOMINAL HYSTERECTOMY    . BLADDER SUSPENSION    . BREAST LUMPECTOMY Left    lumpectomy  . CARPAL TUNNEL RELEASE     left wrist  . CHOLECYSTECTOMY    . FINGER SURGERY    . IR FLUORO GUIDE PORT INSERTION RIGHT  03/12/2017  . IR US GUIDE VASC ACCESS RIGHT  03/12/2017  . PERMANENT PACEMAKER INSERTION N/A 12/04/2011   Procedure: PERMANENT  PACEMAKER INSERTION;  Surgeon: Evans Lance, MD;  Location[facility] C[facility] LAB;  Service: Cardiovascular;  Laterality: N/A;  . SHOULDER SURGERY    . TONSILLECTOMY AND ADENOIDECTOMY      REVIEW OF SYSTEMS:   Review of Systems  Constitutional: Negative for appetite change, chills, fatigue, fever and unexpected weight change.  HENT:   Negative for mouth sores, nosebleeds, sore throat and trouble swallowing.   Eyes: Negative for eye problems and icterus.  Respiratory: Negative for cough, hemoptysis, shortness of breath and wheezing.   Cardiovascular: Negative for chest pain and leg swelling.  Gastrointestinal: Negative for abdominal pain, constipation, diarrhea, nausea and vomiting.  Genitourinary: Negative for bladder incontinence, difficulty urinating, dysuria, frequency and hematuria.   Musculoskeletal:  Negative for back pain, neck pain and neck stiffness.  Positive persistent right shoulder pain. Skin: Negative for itching and rash.  Neurological: Negative for dizziness, extremity weakness, headaches, light-headedness and seizures.  Hematological: Negative for adenopathy. Does not bruise/bleed easily.  Psychiatric/Behavioral: Negative for confusion, depression and sleep disturbance. The patient is not nervous/anxious.     PHYSICAL EXAMINATION:  Blood pressure (!) 118/59, pulse 79, temperature 97.9 F (36.6 C), temperature source Oral, resp. rate 18, height 5' (1.524 m), weight 147 lb (66.7 kg), SpO2 98 %.  ECOG PERFORMANCE STATUS: 1 - Symptomatic but completely ambulatory  Physical Exam  Constitutional: Oriented to person, place, and time and well-developed, well-nourished, and in no distress. No distress.  HENT:  Head: Normocephalic and atraumatic.  Mouth/Throat: Oropharynx is clear and moist. No oropharyngeal exudate.  Eyes: Conjunctivae are normal. Right eye exhibits no discharge. Left eye exhibits no discharge. No scleral icterus.  Neck: Normal range of motion. Neck supple.   Cardiovascular: Normal rate, regular rhythm, normal heart sounds and intact distal pulses.   Pulmonary/Chest: Effort normal.  Scattered expiratory wheezes.  No respiratory distress. No rales.  Abdominal: Soft. Bowel sounds are normal. Exhibits no distension and no mass. There is no tenderness.  Musculoskeletal: Exhibits no edema.  Decreased range of motion to the right shoulder. Lymphadenopathy:    No cervical adenopathy.  Neurological: Alert and oriented to person, place, and time. Exhibits normal muscle tone. Coordination normal.  Skin: Skin is warm and dry. No rash noted. Not diaphoretic. No erythema. No pallor.  Psychiatric: Mood, memory and judgment normal.  Vitals reviewed.  LABORATORY DATA: Lab Results  Component Value Date   WBC 4.3 07/28/2018   HGB 9.2 (L) 07/28/2018   HCT 27.6 (L) 07/28/2018   MCV 102.1 (H) 07/28/2018   PLT 167 07/28/2018      Chemistry      Component Value Date/Time   NA 140 07/28/2018 1037   NA 141 11/03/2017 0834   K 4.4 07/28/2018 1037   K 3.9 11/03/2017 0834   CL 108 07/28/2018 1037   CO2 24 07/28/2018 1037   CO2 23 11/03/2017 0834   BUN 9 07/28/2018 1037   BUN 10.1 11/03/2017 0834   CREATININE 0.86 07/28/2018 1037   CREATININE 0.8 11/03/2017 0834      Component Value Date/Time   CALCIUM 8.6 (L) 07/28/2018 1037   CALCIUM 9.6 11/03/2017 0834   ALKPHOS 65 07/28/2018 1037   ALKPHOS 61 11/03/2017 0834   AST 27 07/28/2018 1037   AST 13 11/03/2017 0834   ALT 33 07/28/2018 1037   ALT 11 11/03/2017 0834   BILITOT 0.3 07/28/2018 1037   BILITOT 0.31 11/03/2017 0834       RADIOGRAPHIC STUDIES:  Ct Chest W Contrast  Result Date: 07/06/2018 CLINICAL DATA:  84 year old female with history of right-sided lung cancer diagnosed in 2018 status post radiation therapy and chemotherapy which is now complete. Follow-up study. EXAM: CT CHEST, ABDOMEN, AND PELVIS WITH CONTRAST TECHNIQUE: Multidetector CT imaging of the chest, abdomen and pelvis was  performed following the standard protocol during bolus administration of intravenous contrast. CONTRAST:  181mL ISOVUE-300 IOPAMIDOL (ISOVUE-300) INJECTION 61% COMPARISON:  CT the chest, abdomen and pelvis 04/08/2018. FINDINGS: CT CHEST FINDINGS Cardiovascular: Heart size is borderline enlarged. There is no significant pericardial fluid, thickening or pericardial calcification. There is aortic atherosclerosis, as well as atherosclerosis of the great vessels of the mediastinum and the coronary arteries, including calcified atherosclerotic plaque in the left anterior descending and left  circumflex coronary arteries. Right internal jugular single-lumen porta cath with tip terminating in the mid superior vena cava. Left-sided pacemaker with lead tips terminating in the right atrial appendage and right ventricular apex. Mediastinum/Nodes: Prevascular lymph node has regressed, currently measuring only 7 mm in short axis. Persistent enlargement of AP window lymph node measuring 19 mm in short axis (axial image 23 of series 2), decreased from 2.7 cm on the prior. No other definite pathologically enlarged mediastinal or hilar lymph nodes. Esophagus is unremarkable in appearance. No axillary lymphadenopathy. Surgical clips in the left axilla from prior lymph node dissection. Lungs/Pleura: Again noted is a large centrally cavitary mass-like area of architectural distortion in the posterior aspect of the upper right lung involving portions of both the right upper lobe as well as the superior segment of the right lower lobe, currently measuring 3.7 x 7.1 cm (axial image 20 of series 2). Internal contents of this cavitary areas appear very ill-defined, likely to reflect partially sloughed necrotic tissue. No definite mycetoma identified at this time. There is a small amount of layering fluid dependently. In addition, in the medial and anterior aspect of the left upper lobe there is a similar appearing area of architectural  distortion which appears more confluent and mass-like than the prior examination, most compatible with a region of postradiation mass-like fibrosis. Multiple tiny 2-3 mm pulmonary nodules are noted scattered throughout the periphery of the lungs bilaterally, as well as in subpleural locations, nonspecific but unchanged compared to the prior examination. No other larger new suspicious appearing pulmonary nodules or masses. No acute consolidative airspace disease. No pleural effusions. Musculoskeletal: There are no aggressive appearing lytic or blastic lesions noted in the visualized portions of the skeleton. CT ABDOMEN PELVIS FINDINGS Hepatobiliary: No suspicious cystic or solid hepatic lesions. Status post cholecystectomy. Moderate intra and extrahepatic biliary ductal dilatation, unchanged compared to the prior examination. Common bile duct measures up to 12 mm in the porta hepatis. No definite calcified choledocholithiasis. Pancreas: No pancreatic mass. No pancreatic ductal dilatation. No pancreatic or peripancreatic fluid or inflammatory changes. Spleen: Again noted are innumerable subcentimeter low-attenuation lesions scattered throughout the spleen, too small to characterize but similar to prior studies, favored to represent benign lesions. Adrenals/Urinary Tract: 5.4 cm simple cyst in the upper pole of the left kidney. Small calyceal diverticulum with dependent calculus in the interpolar region of the left kidney (axial image 69 of series 2). Right kidney and bilateral adrenal glands are normal in appearance. No hydroureteronephrosis. Urinary bladder is normal in appearance. Stomach/Bowel: Normal appearance of the stomach. No pathologic dilatation of small bowel or colon. The appendix is not confidently identified and may be surgically absent. Regardless, there are no inflammatory changes noted adjacent to the cecum to suggest the presence of an acute appendicitis at this time. Vascular/Lymphatic: Aortic  atherosclerosis with areas of fusiform ectasia of the infrarenal abdominal aorta which measure up to 2.0 x 2.3 cm. No lymphadenopathy noted in the abdomen or pelvis. Reproductive: Status post hysterectomy. Ovaries are not confidently identified may be surgically absent or atrophic. Other: Bilateral inguinal hernias containing only fat. No significant volume of ascites. No pneumoperitoneum. Musculoskeletal: There are no aggressive appearing lytic or blastic lesions noted in the visualized portions of the skeleton. IMPRESSION: 1. Evolving postradiation changes in the lungs bilaterally, as above. The possibility of residual disease in the right lung is not excluded, however, the findings may simply reflect evolving centrally cavitary/necrotic postradiation mass-like fibrosis. Continued attention on follow-up studies is recommended. 2.  Malignant lymphadenopathy in the mediastinum has regressed, with persistent enlarged AP window lymph node which has decreased in size compared to the prior study. 3. No new signs of metastatic disease noted in the abdomen or pelvis. 4. Additional incidental findings, as above. Electronically Signed   By: Vinnie Langton M.D.   On: 07/06/2018 16:38   Ct Abdomen Pelvis W Contrast  Result Date: 07/06/2018 CLINICAL DATA:  84 year old female with history of right-sided lung cancer diagnosed in 2018 status post radiation therapy and chemotherapy which is now complete. Follow-up study. EXAM: CT CHEST, ABDOMEN, AND PELVIS WITH CONTRAST TECHNIQUE: Multidetector CT imaging of the chest, abdomen and pelvis was performed following the standard protocol during bolus administration of intravenous contrast. CONTRAST:  129mL ISOVUE-300 IOPAMIDOL (ISOVUE-300) INJECTION 61% COMPARISON:  CT the chest, abdomen and pelvis 04/08/2018. FINDINGS: CT CHEST FINDINGS Cardiovascular: Heart size is borderline enlarged. There is no significant pericardial fluid, thickening or pericardial calcification. There is  aortic atherosclerosis, as well as atherosclerosis of the great vessels of the mediastinum and the coronary arteries, including calcified atherosclerotic plaque in the left anterior descending and left circumflex coronary arteries. Right internal jugular single-lumen porta cath with tip terminating in the mid superior vena cava. Left-sided pacemaker with lead tips terminating in the right atrial appendage and right ventricular apex. Mediastinum/Nodes: Prevascular lymph node has regressed, currently measuring only 7 mm in short axis. Persistent enlargement of AP window lymph node measuring 19 mm in short axis (axial image 23 of series 2), decreased from 2.7 cm on the prior. No other definite pathologically enlarged mediastinal or hilar lymph nodes. Esophagus is unremarkable in appearance. No axillary lymphadenopathy. Surgical clips in the left axilla from prior lymph node dissection. Lungs/Pleura: Again noted is a large centrally cavitary mass-like area of architectural distortion in the posterior aspect of the upper right lung involving portions of both the right upper lobe as well as the superior segment of the right lower lobe, currently measuring 3.7 x 7.1 cm (axial image 20 of series 2). Internal contents of this cavitary areas appear very ill-defined, likely to reflect partially sloughed necrotic tissue. No definite mycetoma identified at this time. There is a small amount of layering fluid dependently. In addition, in the medial and anterior aspect of the left upper lobe there is a similar appearing area of architectural distortion which appears more confluent and mass-like than the prior examination, most compatible with a region of postradiation mass-like fibrosis. Multiple tiny 2-3 mm pulmonary nodules are noted scattered throughout the periphery of the lungs bilaterally, as well as in subpleural locations, nonspecific but unchanged compared to the prior examination. No other larger new suspicious appearing  pulmonary nodules or masses. No acute consolidative airspace disease. No pleural effusions. Musculoskeletal: There are no aggressive appearing lytic or blastic lesions noted in the visualized portions of the skeleton. CT ABDOMEN PELVIS FINDINGS Hepatobiliary: No suspicious cystic or solid hepatic lesions. Status post cholecystectomy. Moderate intra and extrahepatic biliary ductal dilatation, unchanged compared to the prior examination. Common bile duct measures up to 12 mm in the porta hepatis. No definite calcified choledocholithiasis. Pancreas: No pancreatic mass. No pancreatic ductal dilatation. No pancreatic or peripancreatic fluid or inflammatory changes. Spleen: Again noted are innumerable subcentimeter low-attenuation lesions scattered throughout the spleen, too small to characterize but similar to prior studies, favored to represent benign lesions. Adrenals/Urinary Tract: 5.4 cm simple cyst in the upper pole of the left kidney. Small calyceal diverticulum with dependent calculus in the interpolar region of the left  kidney (axial image 69 of series 2). Right kidney and bilateral adrenal glands are normal in appearance. No hydroureteronephrosis. Urinary bladder is normal in appearance. Stomach/Bowel: Normal appearance of the stomach. No pathologic dilatation of small bowel or colon. The appendix is not confidently identified and may be surgically absent. Regardless, there are no inflammatory changes noted adjacent to the cecum to suggest the presence of an acute appendicitis at this time. Vascular/Lymphatic: Aortic atherosclerosis with areas of fusiform ectasia of the infrarenal abdominal aorta which measure up to 2.0 x 2.3 cm. No lymphadenopathy noted in the abdomen or pelvis. Reproductive: Status post hysterectomy. Ovaries are not confidently identified may be surgically absent or atrophic. Other: Bilateral inguinal hernias containing only fat. No significant volume of ascites. No pneumoperitoneum.  Musculoskeletal: There are no aggressive appearing lytic or blastic lesions noted in the visualized portions of the skeleton. IMPRESSION: 1. Evolving postradiation changes in the lungs bilaterally, as above. The possibility of residual disease in the right lung is not excluded, however, the findings may simply reflect evolving centrally cavitary/necrotic postradiation mass-like fibrosis. Continued attention on follow-up studies is recommended. 2. Malignant lymphadenopathy in the mediastinum has regressed, with persistent enlarged AP window lymph node which has decreased in size compared to the prior study. 3. No new signs of metastatic disease noted in the abdomen or pelvis. 4. Additional incidental findings, as above. Electronically Signed   By: Vinnie Langton M.D.   On: 07/06/2018 16:38     ASSESSMENT/PLAN:  Stage IV squamous cell carcinoma of right lung [facility]) This is a very pleasant 84 year old white female with a stage IV non-small cell lung cancer, adenocarcinoma with PDL 1 expression of 5%. The patient underwent systemic chemotherapy with carboplatin and paclitaxel, status post 5 cycles and has been tolerating her treatment well except for increasing fatigue and weakness as well as development of peripheral neuropathy. Last dose of chemotherapy was in July 2018.   The patient was a started on treatment with Keytruda status post 3 cycles discontinued secondary to disease progression. She was found to have disease progression. The patient was a started on systemic chemotherapy with gemcitabine 1000 mg/M2 on days 1 and 8 status post 4 cycles.  She has been tolerating this treatment well with no concerning complaints. Recommend for her to proceed with day 1 of cycle 5 of her treatment today as scheduled.  She will return next week for labs and chemotherapy and return in 3 weeks for evaluation prior to starting cycle #6 of her treatment.  For pain management, I have refilled her oxycodone today.   Nursing spoke with the prior authorization department today regarding her fentanyl patch.  We should have a termination within the next 24 to 48 hours.  Patient is aware that we will contact her once we get an update.  For her right shoulder pain, I do not see any evidence of bone metastasis on her recent CT scan.  She has limited range of motion.  We discussed following up with her orthopedic doctor for evaluation and management of her right shoulder pain.  The patient was advised to call immediately if she has any concerning symptoms in the interval. The patient voices understanding of current disease status and treatment options and is in agreement with the current care plan. All questions were answered. The patient knows to call the clinic with any problems, questions or concerns. We can certainly see the patient much sooner if necessary.   No orders of the defined types  were placed in this encounter.    Mikey Bussing, DNP, AGPCNP-BC, AOCNP 07/28/18

## 2018-07-28 NOTE — Patient Instructions (Signed)
[facility] °Discharge Instructions for Patients Receiving Chemotherapy ° °Today you received the following chemotherapy agents Gemzar ° °To help prevent nausea and vomiting after your treatment, we encourage you to take your nausea medication as directed. °  °If you develop nausea and vomiting that is not controlled by your nausea medication, call the clinic.  ° °BELOW ARE SYMPTOMS THAT SHOULD BE REPORTED IMMEDIATELY: °· *FEVER GREATER THAN 100.5 F °· *CHILLS WITH OR WITHOUT FEVER °· NAUSEA AND VOMITING THAT IS NOT CONTROLLED WITH YOUR NAUSEA MEDICATION °· *UNUSUAL SHORTNESS OF BREATH °· *UNUSUAL BRUISING OR BLEEDING °· TENDERNESS IN MOUTH AND THROAT WITH OR WITHOUT PRESENCE OF ULCERS °· *URINARY PROBLEMS °· *BOWEL PROBLEMS °· UNUSUAL RASH °Items with * indicate a potential emergency and should be followed up as soon as possible. ° °Feel free to call the clinic should you have any questions or concerns. The clinic phone number is [phone]. ° °Please show the CHEMO ALERT CARD at check-in to the Emergency Department and triage nurse. ° ° °

## 2018-07-28 NOTE — Assessment & Plan Note (Addendum)
This is a very pleasant 84 year old white female with a stage IV non-small cell lung cancer, adenocarcinoma with PDL 1 expression of 5%. The patient underwent systemic chemotherapy with carboplatin and paclitaxel, status post 5 cycles and has been tolerating her treatment well except for increasing fatigue and weakness as well as development of peripheral neuropathy. Last dose of chemotherapy was in July 2018.   The patient was a started on treatment with Keytruda status post 3 cycles discontinued secondary to disease progression. She was found to have disease progression. The patient was a started on systemic chemotherapy with gemcitabine 1000 mg/M2 on days 1 and 8 status post 4 cycles.  She has been tolerating this treatment well with no concerning complaints. Recommend for her to proceed with day 1 of cycle 5 of her treatment today as scheduled.  She will return next week for labs and chemotherapy and return in 3 weeks for evaluation prior to starting cycle #6 of her treatment.  For pain management, I have refilled her oxycodone today.  Nursing spoke with the prior authorization department today regarding her fentanyl patch.  We should have a termination within the next 24 to 48 hours.  Patient is aware that we will contact her once we get an update.  For her right shoulder pain, I do not see any evidence of bone metastasis on her recent CT scan.  She has limited range of motion.  We discussed following up with her orthopedic doctor for evaluation and management of her right shoulder pain.  The patient was advised to call immediately if she has any concerning symptoms in the interval. The patient voices understanding of current disease status and treatment options and is in agreement with the current care plan. All questions were answered. The patient knows to call the clinic with any problems, questions or concerns. We can certainly see the patient much sooner if necessary.

## 2018-07-29 ENCOUNTER — Telehealth: Payer: Self-pay | Admitting: Oncology

## 2018-07-29 NOTE — Telephone Encounter (Signed)
Pt already scheduled per 8/20 los.

## 2018-08-04 ENCOUNTER — Other Ambulatory Visit: Payer: Self-pay | Admitting: Medical Oncology

## 2018-08-04 DIAGNOSIS — C3491 Malignant neoplasm of unspecified part of right bronchus or lung: Secondary | ICD-10-CM

## 2018-08-04 MED ORDER — OXYCODONE HCL 5 MG PO TABS: 5.0000 mg | ORAL_TABLET | ORAL | 0 refills | Status: DC | PRN

## 2018-08-04 MED ORDER — FENTANYL 25 MCG/HR TD PT72: 25.0000 ug | MEDICATED_PATCH | TRANSDERMAL | 0 refills | Status: DC

## 2018-08-05 ENCOUNTER — Inpatient Hospital Stay: Payer: Medicare Other

## 2018-08-05 ENCOUNTER — Other Ambulatory Visit: Payer: Self-pay | Admitting: Medical

## 2018-08-05 ENCOUNTER — Ambulatory Visit (HOSPITAL_BASED_OUTPATIENT_CLINIC_OR_DEPARTMENT_OTHER): Payer: Medicare Other | Admitting: Medical

## 2018-08-05 VITALS — BP 145/61 | HR 79 | Temp 98.4°F | Resp 18 | Ht 60.0 in | Wt 144.0 lb

## 2018-08-05 DIAGNOSIS — C3491 Malignant neoplasm of unspecified part of right bronchus or lung: Secondary | ICD-10-CM

## 2018-08-05 DIAGNOSIS — Z5111 Encounter for antineoplastic chemotherapy: Secondary | ICD-10-CM | POA: Diagnosis not present

## 2018-08-05 DIAGNOSIS — J069 Acute upper respiratory infection, unspecified: Secondary | ICD-10-CM | POA: Diagnosis not present

## 2018-08-05 DIAGNOSIS — Z95828 Presence of other vascular implants and grafts: Secondary | ICD-10-CM

## 2018-08-05 LAB — CBC WITH DIFFERENTIAL (CANCER CENTER ONLY)
BASOS PCT: 0 %
Basophils Absolute: 0 10*3/uL (ref 0.0–0.1)
Eosinophils Absolute: 0 10*3/uL (ref 0.0–0.5)
Eosinophils Relative: 0 %
HCT: 26 % — ABNORMAL LOW (ref 34.8–46.6)
Hemoglobin: 8.3 g/dL — ABNORMAL LOW (ref 11.6–15.9)
Lymphocytes Relative: 8 %
Lymphs Abs: 0.3 10*3/uL — ABNORMAL LOW (ref 0.9–3.3)
MCH: 32.8 pg (ref 25.1–34.0)
MCHC: 31.9 g/dL (ref 31.5–36.0)
MCV: 102.8 fL — ABNORMAL HIGH (ref 79.5–101.0)
MONO ABS: 1 10*3/uL — AB (ref 0.1–0.9)
MONOS PCT: 28 %
Neutro Abs: 2.1 10*3/uL (ref 1.5–6.5)
Neutrophils Relative %: 64 %
PLATELETS: 131 10*3/uL — AB (ref 145–400)
RBC: 2.53 MIL/uL — ABNORMAL LOW (ref 3.70–5.45)
RDW: 20.9 % — ABNORMAL HIGH (ref 11.2–14.5)
WBC Count: 3.4 10*3/uL — ABNORMAL LOW (ref 3.9–10.3)
nRBC: 2 /100 WBC — ABNORMAL HIGH

## 2018-08-05 LAB — CMP (CANCER CENTER ONLY)
ALBUMIN: 2.6 g/dL — AB (ref 3.5–5.0)
ALK PHOS: 62 U/L (ref 38–126)
ALT: 43 U/L (ref 0–44)
ANION GAP: 6 (ref 5–15)
AST: 38 U/L (ref 15–41)
BILIRUBIN TOTAL: 0.4 mg/dL (ref 0.3–1.2)
BUN: 10 mg/dL (ref 8–23)
CO2: 28 mmol/L (ref 22–32)
Calcium: 8.7 mg/dL — ABNORMAL LOW (ref 8.9–10.3)
Chloride: 105 mmol/L (ref 98–111)
Creatinine: 0.92 mg/dL (ref 0.44–1.00)
GFR, EST NON AFRICAN AMERICAN: 54 mL/min — AB (ref >60)
GFR, Est AFR Am: >60 mL/min (ref >60)
GLUCOSE: 177 mg/dL — AB (ref 70–99)
Potassium: 4.5 mmol/L (ref 3.5–5.1)
Sodium: 139 mmol/L (ref 135–145)
TOTAL PROTEIN: 6 g/dL — AB (ref 6.5–8.1)

## 2018-08-05 MED ORDER — HEPARIN SOD (PORK) LOCK FLUSH 100 UNIT/ML IV SOLN
500.0000 [IU] | Freq: Once | INTRAVENOUS | Status: DC
  Filled 2018-08-05: qty 5

## 2018-08-05 MED ORDER — AZITHROMYCIN 250 MG PO TABS: ORAL_TABLET | ORAL | 0 refills | Status: AC

## 2018-08-05 MED ORDER — PROCHLORPERAZINE MALEATE 10 MG PO TABS
10.0000 mg | ORAL_TABLET | Freq: Once | ORAL | Status: AC
  Administered 2018-08-05: 10 mg via ORAL

## 2018-08-05 MED ORDER — SODIUM CHLORIDE 0.9% FLUSH
10.0000 mL | Freq: Once | INTRAVENOUS | Status: AC
  Administered 2018-08-05: 10 mL
  Filled 2018-08-05: qty 10

## 2018-08-05 MED ORDER — SODIUM CHLORIDE 0.9 % IV SOLN
Freq: Once | INTRAVENOUS | Status: AC
  Administered 2018-08-05: 14:00:00 via INTRAVENOUS
  Filled 2018-08-05: qty 250

## 2018-08-05 MED ORDER — SODIUM CHLORIDE 0.9 % IV SOLN
1000.0000 mg/m2 | Freq: Once | INTRAVENOUS | Status: AC
  Administered 2018-08-05: 1672 mg via INTRAVENOUS
  Filled 2018-08-05: qty 43.97

## 2018-08-05 MED ORDER — HEPARIN SOD (PORK) LOCK FLUSH 100 UNIT/ML IV SOLN
500.0000 [IU] | Freq: Once | INTRAVENOUS | Status: AC | PRN
  Administered 2018-08-05: 500 [IU]
  Filled 2018-08-05: qty 5

## 2018-08-05 MED ORDER — SODIUM CHLORIDE 0.9% FLUSH
10.0000 mL | INTRAVENOUS | Status: DC | PRN
  Administered 2018-08-05: 10 mL
  Filled 2018-08-05: qty 10

## 2018-08-05 MED ORDER — PROCHLORPERAZINE MALEATE 10 MG PO TABS
ORAL_TABLET | ORAL | Status: AC
  Filled 2018-08-05: qty 1

## 2018-08-07 NOTE — Progress Notes (Signed)
Symptoms Management Clinic Progress Note   Maria Washington [id number] [birth year] 84 y.o.  [name] is managed by Dr. Fanny Bien. Mohamed  Actively treated with chemotherapy/immunotherapy: yes  Current Therapy: Gemcitabine  Last Treated: 08/05/2018 (cycle 5, day 8)  Assessment: Plan:    Upper respiratory tract infection, unspecified type   URI: The patient was given a prescription for a Z-Pak.  Please see After Visit Summary for patient specific instructions.  Future Appointments  Date Time Provider [place]  08/18/2018  1:15 PM [facility] LAB 4 [facility] None  08/18/2018  1:30 PM [facility] [place] FLUSH [facility] None  08/18/2018  2:00 PM Curcio, Kristin R, NP [facility] None  08/18/2018  3:00 PM [facility] INFUSION [facility] None  08/26/2018 12:00 PM [facility] LAB 4 [facility] None  08/26/2018 12:15 PM [facility] [place] FLUSH [facility] None  08/26/2018  1:15 PM [facility] INFUSION [facility] None    No orders of the defined types were placed in this encounter.      Subjective:   Patient ID:  Maria Washington is a 84 y.o. (DOB 1929-07-16) female.  Chief Complaint: No chief complaint on file.   HPI Maria Washington is an 84 year old female with history of a metastatic squamous cell carcinoma of the right lung who is managed by Dr. Fanny Bien. Mohamed and is seen in the infusion room today and she is receiving cycle 5, day 8 of gemcitabine.  She reports having a productive cough with thick yellow-green sputum.  She continues to have shortness of breath which is slightly over her baseline.  She has fatigue.  She denies fevers, chills, sweats, facial pain, or sinus pressure.  Medications: I have reviewed the patient's current medications.  Allergies:  Allergies  Allergen Reactions  . Clindamycin/Lincomycin Rash  . Penicillins Rash and Other (See Comments)    Has patient had a PCN reaction causing immediate rash, facial/tongue/throat swelling, SOB or  lightheadedness with hypotension: No Has patient had a PCN reaction causing severe rash involving mucus membranes or skin necrosis: No Has patient had a PCN reaction that required hospitalization: No Has patient had a PCN reaction occurring within the last 10 years: No If all of the above answers are "NO", then may proceed with Cephalosporin use.    Past Medical History:  Diagnosis Date  . Aortic atherosclerosis ([place]) 02/10/2017  . Arthritis   . CAD (coronary artery disease), native coronary artery    3 vessel calcification noted on CT scan   . Cardiac pacemaker in situ 12/05/2011  . Chronic diastolic congestive heart failure ([place])   . COPD (chronic obstructive pulmonary disease) (HCC)    spot on lung  . Dehydration 06/04/2017  . Encounter for antineoplastic chemotherapy 03/04/2017  . Goals of care, counseling/discussion 03/17/2017  . History of breast cancer    Treated with lumpectomy, radiation, and chemo sees Dr. Ralene Ok   . Hypertensive heart disease without CHF   . Hypokalemia 04/18/2017  . Hypothyroid   . Mitral regurgitation   . Paroxysmal atrial fibrillation (HCC)    CHA2DS2VASC score 5  . Personal history of chemotherapy   . Personal history of radiation therapy   . Second degree heart block   . Stage IV squamous cell carcinoma of right lung ([place]) 03/04/2017    Past Surgical History:  Procedure Laterality Date  . ABDOMINAL HYSTERECTOMY    . BLADDER SUSPENSION    . BREAST LUMPECTOMY Left    lumpectomy  . CARPAL TUNNEL RELEASE  left wrist  . CHOLECYSTECTOMY    . FINGER SURGERY    . IR FLUORO GUIDE PORT INSERTION RIGHT  03/12/2017  . IR US GUIDE VASC ACCESS RIGHT  03/12/2017  . PERMANENT PACEMAKER INSERTION N/A 12/04/2011   Procedure: PERMANENT PACEMAKER INSERTION;  Surgeon: Evans Lance, MD;  Location: [facility] CATH LAB;  Service: Cardiovascular;  Laterality: N/A;  . SHOULDER SURGERY    . TONSILLECTOMY AND ADENOIDECTOMY      Family History  Problem Relation Age of  Onset  . Heart attack Mother   . Cancer Brother        kidney  . Cancer Daughter        lung  . Breast cancer Neg Hx     Social History   Socioeconomic History  . Marital status: Widowed    Spouse name: Not on file  . Number of children: 6  . Years of education: 69  . Highest education level: Not on file  Occupational History  . Not on file  Social Needs  . Financial resource strain: Not on file  . Food insecurity:    Worry: Not on file    Inability: Not on file  . Transportation needs:    Medical: Not on file    Non-medical: Not on file  Tobacco Use  . Smoking status: Former Smoker    Packs/day: 1.00    Years: 60.00    Pack years: 60.00    Types: Cigarettes    Last attempt to quit: 06/07/2013    Years since quitting: 5.1  . Smokeless tobacco: Never Used  Substance and Sexual Activity  . Alcohol use: No  . Drug use: No  . Sexual activity: Not Currently  Lifestyle  . Physical activity:    Days per week: Not on file    Minutes per session: Not on file  . Stress: Not on file  Relationships  . Social connections:    Talks on phone: Not on file    Gets together: Not on file    Attends religious service: Not on file    Active member of club or organization: Not on file    Attends meetings of clubs or organizations: Not on file    Relationship status: Not on file  . Intimate partner violence:    Fear of current or ex partner: Not on file    Emotionally abused: Not on file    Physically abused: Not on file    Forced sexual activity: Not on file  Other Topics Concern  . Not on file  Social History Narrative   Widow.  Has 22 dogs.   Right handed    Caffeine use: Coffee- 2 cups per day or less   Decaf Mtn Dew   Lives with daughter    Past Medical History, Surgical history, Social history, and Family history were reviewed and updated as appropriate.   Please see review of systems for further details on the patient's review from today.   Review of Systems:    Review of Systems  Constitutional: Positive for fatigue. Negative for chills, diaphoresis and fever.  HENT: Negative for congestion, postnasal drip, rhinorrhea and sore throat.   Respiratory: Positive for cough and shortness of breath. Negative for wheezing.   Cardiovascular: Negative for palpitations.  Neurological: Negative for headaches.    Objective:   Physical Exam:  There were no vitals taken for this visit. ECOG: 0  Physical Exam  Constitutional: No distress.  HENT:  Head: Normocephalic and  atraumatic.  Right Ear: External ear normal.  Left Ear: External ear normal.  Mouth/Throat: Oropharynx is clear and moist. No oropharyngeal exudate.  Neck: Normal range of motion. Neck supple.  Cardiovascular: Normal rate, regular rhythm and normal heart sounds. Exam reveals no gallop and no friction rub.  No murmur heard. Pulmonary/Chest: Effort normal. No respiratory distress. She has no wheezes.  Coarse breath sounds are noted throughout all lung fields.  Lymphadenopathy:    She has no cervical adenopathy.  Neurological: She is alert.  Skin: Skin is warm and dry. No rash noted. She is not diaphoretic. No erythema.    Lab Review:     Component Value Date/Time   NA 139 08/05/2018 1106   NA 141 11/03/2017 0834   K 4.5 08/05/2018 1106   K 3.9 11/03/2017 0834   CL 105 08/05/2018 1106   CO2 28 08/05/2018 1106   CO2 23 11/03/2017 0834   GLUCOSE 177 (H) 08/05/2018 1106   GLUCOSE 110 11/03/2017 0834   BUN 10 08/05/2018 1106   BUN 10.1 11/03/2017 0834   CREATININE 0.92 08/05/2018 1106   CREATININE 0.8 11/03/2017 0834   CALCIUM 8.7 (L) 08/05/2018 1106   CALCIUM 9.6 11/03/2017 0834   PROT 6.0 (L) 08/05/2018 1106   PROT 6.8 11/03/2017 0834   ALBUMIN 2.6 (L) 08/05/2018 1106   ALBUMIN 2.7 (L) 11/03/2017 0834   AST 38 08/05/2018 1106   AST 13 11/03/2017 0834   ALT 43 08/05/2018 1106   ALT 11 11/03/2017 0834   ALKPHOS 62 08/05/2018 1106   ALKPHOS 61 11/03/2017 0834   BILITOT  0.4 08/05/2018 1106   BILITOT 0.31 11/03/2017 0834   GFRNONAA 54 (L) 08/05/2018 1106   GFRAA >60 08/05/2018 1106       Component Value Date/Time   WBC 3.4 (L) 08/05/2018 1106   WBC 6.9 02/02/2018 1116   RBC 2.53 (L) 08/05/2018 1106   HGB 8.3 (L) 08/05/2018 1106   HGB 10.0 (L) 11/03/2017 0834   HCT 26.0 (L) 08/05/2018 1106   HCT 31.6 (L) 11/03/2017 0834   PLT 131 (L) 08/05/2018 1106   PLT 207 11/03/2017 0834   MCV 102.8 (H) 08/05/2018 1106   MCV 93.5 11/03/2017 0834   MCH 32.8 08/05/2018 1106   MCHC 31.9 08/05/2018 1106   RDW 20.9 (H) 08/05/2018 1106   RDW 16.5 (H) 11/03/2017 0834   LYMPHSABS 0.3 (L) 08/05/2018 1106   LYMPHSABS 1.0 11/03/2017 0834   MONOABS 1.0 (H) 08/05/2018 1106   MONOABS 0.7 11/03/2017 0834   EOSABS 0.0 08/05/2018 1106   EOSABS 0.1 11/03/2017 0834   BASOSABS 0.0 08/05/2018 1106   BASOSABS 0.0 11/03/2017 0834   -------------------------------  Imaging from last 24 hours (if applicable):  Radiology interpretation: No results found.

## 2018-08-18 ENCOUNTER — Inpatient Hospital Stay: Payer: Medicare Other

## 2018-08-18 ENCOUNTER — Other Ambulatory Visit: Payer: Self-pay | Admitting: Medical Oncology

## 2018-08-18 ENCOUNTER — Telehealth: Payer: Self-pay | Admitting: *Deleted

## 2018-08-18 ENCOUNTER — Other Ambulatory Visit: Payer: Self-pay | Admitting: Emergency Medicine

## 2018-08-18 ENCOUNTER — Ambulatory Visit (HOSPITAL_COMMUNITY)
Admission: RE | Admit: 2018-08-18 | Discharge: 2018-08-18 | Disposition: A | Discharge status: Home / Self Care | Payer: Medicare Other | Source: Ambulatory Visit | Attending: Nurse Practitioner | Admitting: Nurse Practitioner

## 2018-08-18 ENCOUNTER — Inpatient Hospital Stay: Payer: Medicare Other | Admitting: Oncology

## 2018-08-18 ENCOUNTER — Emergency Department (HOSPITAL_COMMUNITY)
Admission: EM | Admit: 2018-08-18 | Discharge: 2018-08-18 | Discharge status: Absent without leave | Payer: Medicare Other

## 2018-08-18 ENCOUNTER — Ambulatory Visit (HOSPITAL_COMMUNITY)
Admission: RE | Admit: 2018-08-18 | Discharge: 2018-08-18 | Disposition: A | Discharge status: Home / Self Care | Payer: Medicare Other | Source: Ambulatory Visit | Attending: Emergency Medicine | Admitting: Emergency Medicine

## 2018-08-18 ENCOUNTER — Ambulatory Visit (HOSPITAL_BASED_OUTPATIENT_CLINIC_OR_DEPARTMENT_OTHER): Payer: Medicare Other | Admitting: Nurse Practitioner

## 2018-08-18 ENCOUNTER — Inpatient Hospital Stay: Payer: Medicare Other | Attending: Internal Medicine

## 2018-08-18 VITALS — BP 130/56 | HR 74 | Temp 99.2°F | Resp 21 | Ht 60.0 in | Wt 143.9 lb

## 2018-08-18 DIAGNOSIS — R531 Weakness: Secondary | ICD-10-CM

## 2018-08-18 DIAGNOSIS — C3491 Malignant neoplasm of unspecified part of right bronchus or lung: Secondary | ICD-10-CM

## 2018-08-18 DIAGNOSIS — R5383 Other fatigue: Secondary | ICD-10-CM | POA: Insufficient documentation

## 2018-08-18 DIAGNOSIS — G893 Neoplasm related pain (acute) (chronic): Secondary | ICD-10-CM | POA: Insufficient documentation

## 2018-08-18 DIAGNOSIS — D6481 Anemia due to antineoplastic chemotherapy: Secondary | ICD-10-CM | POA: Insufficient documentation

## 2018-08-18 DIAGNOSIS — T451X5A Adverse effect of antineoplastic and immunosuppressive drugs, initial encounter: Secondary | ICD-10-CM | POA: Insufficient documentation

## 2018-08-18 DIAGNOSIS — Z95828 Presence of other vascular implants and grafts: Secondary | ICD-10-CM

## 2018-08-18 DIAGNOSIS — R918 Other nonspecific abnormal finding of lung field: Secondary | ICD-10-CM | POA: Insufficient documentation

## 2018-08-18 DIAGNOSIS — R0602 Shortness of breath: Secondary | ICD-10-CM | POA: Insufficient documentation

## 2018-08-18 LAB — CBC WITH DIFFERENTIAL (CANCER CENTER ONLY)
BASOS ABS: 0 10*3/uL (ref 0.0–0.1)
BASOS PCT: 0 %
EOS ABS: 0 10*3/uL (ref 0.0–0.5)
Eosinophils Relative: 0 %
HCT: 24.2 % — ABNORMAL LOW (ref 34.8–46.6)
Hemoglobin: 7.3 g/dL — ABNORMAL LOW (ref 11.6–15.9)
Lymphocytes Relative: 5 %
Lymphs Abs: 0.6 10*3/uL — ABNORMAL LOW (ref 0.9–3.3)
MCH: 33.6 pg (ref 25.1–34.0)
MCHC: 30.2 g/dL — ABNORMAL LOW (ref 31.5–36.0)
MCV: 111.5 fL — ABNORMAL HIGH (ref 79.5–101.0)
MONO ABS: 1.3 10*3/uL — AB (ref 0.1–0.9)
MONOS PCT: 9 %
NEUTROS ABS: 12.2 10*3/uL — AB (ref 1.5–6.5)
NEUTROS PCT: 86 %
PLATELETS: 210 10*3/uL (ref 145–400)
RBC: 2.17 MIL/uL — ABNORMAL LOW (ref 3.70–5.45)
RDW: 22.9 % — AB (ref 11.2–14.5)
WBC Count: 14.1 10*3/uL — ABNORMAL HIGH (ref 3.9–10.3)
nRBC: 1 /100 WBC — ABNORMAL HIGH

## 2018-08-18 LAB — CMP (CANCER CENTER ONLY)
ALT: <6 U/L (ref 0–44)
AST: 11 U/L — ABNORMAL LOW (ref 15–41)
Albumin: 2.2 g/dL — ABNORMAL LOW (ref 3.5–5.0)
Alkaline Phosphatase: 67 U/L (ref 38–126)
Anion gap: 10 (ref 5–15)
BUN: 22 mg/dL (ref 8–23)
CO2: 24 mmol/L (ref 22–32)
Calcium: 9 mg/dL (ref 8.9–10.3)
Chloride: 102 mmol/L (ref 98–111)
Creatinine: 1.07 mg/dL — ABNORMAL HIGH (ref 0.44–1.00)
GFR, Est AFR Am: 53 mL/min — ABNORMAL LOW
GFR, Estimated: 45 mL/min — ABNORMAL LOW
Glucose, Bld: 271 mg/dL — ABNORMAL HIGH (ref 70–99)
Potassium: 5.3 mmol/L — ABNORMAL HIGH (ref 3.5–5.1)
Sodium: 136 mmol/L (ref 135–145)
Total Bilirubin: 0.5 mg/dL (ref 0.3–1.2)
Total Protein: 6 g/dL — ABNORMAL LOW (ref 6.5–8.1)

## 2018-08-18 LAB — SAMPLE TO BLOOD BANK

## 2018-08-18 MED ORDER — DOXYCYCLINE HYCLATE 100 MG PO TABS: 100.0000 mg | ORAL_TABLET | Freq: Two times a day (BID) | ORAL | 0 refills | Status: DC

## 2018-08-18 MED ORDER — TBO-FILGRASTIM 300 MCG/0.5ML ~~LOC~~ SOSY: 300.0000 ug | PREFILLED_SYRINGE | Freq: Every morning | SUBCUTANEOUS | Status: DC

## 2018-08-18 MED ORDER — DOXYCYCLINE HYCLATE 100 MG PO TABS: 100.0000 mg | ORAL_TABLET | Freq: Two times a day (BID) | ORAL | 0 refills | Status: AC

## 2018-08-18 MED ORDER — SODIUM CHLORIDE 0.9 % IV SOLN
INTRAVENOUS | Status: DC
  Filled 2018-08-18: qty 250

## 2018-08-18 MED ORDER — HEPARIN SOD (PORK) LOCK FLUSH 100 UNIT/ML IV SOLN
500.0000 [IU] | Freq: Once | INTRAVENOUS | Status: AC
  Administered 2018-08-18: 500 [IU]
  Filled 2018-08-18: qty 5

## 2018-08-18 MED ORDER — SODIUM CHLORIDE 0.9% FLUSH
10.0000 mL | Freq: Once | INTRAVENOUS | Status: AC
  Administered 2018-08-18: 10 mL
  Filled 2018-08-18: qty 10

## 2018-08-18 MED ORDER — SODIUM CHLORIDE 0.9 % IV SOLN
Freq: Once | INTRAVENOUS | Status: AC
  Administered 2018-08-18: 15:00:00 via INTRAVENOUS
  Filled 2018-08-18: qty 250

## 2018-08-18 NOTE — Patient Instructions (Signed)
Dehydration, Adult Dehydration is when there is not enough fluid or water in your body. This happens when you lose more fluids than you take in. Dehydration can range from mild to very bad. It should be treated right away to keep it from getting very bad. Symptoms of mild dehydration may include:  Thirst.  Dry lips.  Slightly dry mouth.  Dry, warm skin.  Dizziness. Symptoms of moderate dehydration may include:  Very dry mouth.  Muscle cramps.  Dark pee (urine). Pee may be the color of tea.  Your body making less pee.  Your eyes making fewer tears.  Heartbeat that is uneven or faster than normal (palpitations).  Headache.  Light-headedness, especially when you stand up from sitting.  Fainting (syncope). Symptoms of very bad dehydration may include:  Changes in skin, such as: ? Cold and clammy skin. ? Blotchy (mottled) or pale skin. ? Skin that does not quickly return to normal after being lightly pinched and let go (poor skin turgor).  Changes in body fluids, such as: ? Feeling very thirsty. ? Your eyes making fewer tears. ? Not sweating when body temperature is high, such as in hot weather. ? Your body making very little pee.  Changes in vital signs, such as: ? Weak pulse. ? Pulse that is more than 100 beats a minute when you are sitting still. ? Fast breathing. ? Low blood pressure.  Other changes, such as: ? Sunken eyes. ? Cold hands and feet. ? Confusion. ? Lack of energy (lethargy). ? Trouble waking up from sleep. ? Short-term weight loss. ? Unconsciousness. Follow these instructions at home:  If told by your doctor, drink an ORS: ? Make an ORS by using instructions on the package. ? Start by drinking small amounts, about  cup (120 mL) every 5-10 minutes. ? Slowly drink more until you have had the amount that your doctor said to have.  Drink enough clear fluid to keep your pee clear or pale yellow. If you were told to drink an ORS, finish the ORS  first, then start slowly drinking clear fluids. Drink fluids such as: ? Water. Do not drink only water by itself. Doing that can make the salt (sodium) level in your body get too low (hyponatremia). ? Ice chips. ? Fruit juice that you have added water to (diluted). ? Low-calorie sports drinks.  Avoid: ? Alcohol. ? Drinks that have a lot of sugar. These include high-calorie sports drinks, fruit juice that does not have water added, and soda. ? Caffeine. ? Foods that are greasy or have a lot of fat or sugar.  Take over-the-counter and prescription medicines only as told by your doctor.  Do not take salt tablets. Doing that can make the salt level in your body get too high (hypernatremia).  Eat foods that have minerals (electrolytes). Examples include bananas, oranges, potatoes, tomatoes, and spinach.  Keep all follow-up visits as told by your doctor. This is important. Contact a doctor if:  You have belly (abdominal) pain that: ? Gets worse. ? Stays in one area (localizes).  You have a rash.  You have a stiff neck.  You get angry or annoyed more easily than normal (irritability).  You are more sleepy than normal.  You have a harder time waking up than normal.  You feel: ? Weak. ? Dizzy. ? Very thirsty.  You have peed (urinated) only a small amount of very dark pee during 6-8 hours. Get help right away if:  You have symptoms of   very bad dehydration.  You cannot drink fluids without throwing up (vomiting).  Your symptoms get worse with treatment.  You have a fever.  You have a very bad headache.  You are throwing up or having watery poop (diarrhea) and it: ? Gets worse. ? Does not go away.  You have blood or something green (bile) in your throw-up.  You have blood in your poop (stool). This may cause poop to look black and tarry.  You have not peed in 6-8 hours.  You pass out (faint).  Your heart rate when you are sitting still is more than 100 beats a  minute.  You have trouble breathing. This information is not intended to replace advice given to you by your health care provider. Make sure you discuss any questions you have with your health care provider. Document Released: 09/21/2009 Document Revised: 06/14/2016 Document Reviewed: 01/19/2016 Elsevier Interactive Patient Education  2018 Elsevier Inc.  

## 2018-08-18 NOTE — Telephone Encounter (Signed)
"  This is [name] calling about my mom Maria Washington who has an appointment at 1:15.  We're on [place], would like her seen now by [facility].  She's very weak, fatigued, has not eaten in  three days and has ben in bed since Saturday."  Advised to register to wait for any accomodation for work in.  No need to complete walk in form with Lab/Flush,F/U and Infusion appointments previously scheduled for today.    Message left for collaborative.

## 2018-08-18 NOTE — Progress Notes (Signed)
Symptoms Management Clinic Progress Note   Maria Washington [id number] [birth year] 84 y.o.  Maria Washington is managed by Dr. Eilleen Kempf  Actively treated with chemotherapy/immunotherapy: yes  Current Therapy: Gemcitabine  Last Treated:08/05/2018 - was due for treatment today   Assessment: Plan:    Stage IV squamous cell carcinoma of right lung ([place]) - Plan: Prepare RBC (crossmatch), Transfuse RBC, DG Chest 1 View, 0.9 %  sodium chloride infusion  Cancer associated pain - Plan: Prepare RBC (crossmatch), Transfuse RBC  Port catheter in place - Plan: Prepare RBC (crossmatch), 0.9 %  sodium chloride infusion  Shortness of breath - Plan: DG Chest 1 View  Anemia due to antineoplastic chemotherapy - Plan: Prepare RBC (crossmatch), DG Chest 1 View, 0.9 %  sodium chloride infusion  We will hold chemo today.  Due to her risk of infection and symptoms we did place her on oral Doxycyline as family expresses concern over other antibiotics and stated the Z pak she took 2 weeks ago wasn't helpful. We will get a chest xray and type and cross with plans to transfuse tomorrow.   Please see After Visit Summary for patient specific instructions.  Future Appointments  Date Time Provider [place]  08/26/2018 12:00 PM [facility] LAB 4 [facility] None  08/26/2018 12:15 PM [facility] [place] FLUSH [facility] None  08/26/2018  1:15 PM [facility] INFUSION [facility] None    Orders Placed This Encounter  Procedures  . DG Chest 1 View  . Prepare RBC (crossmatch)  . Sample to Blood Bank       Subjective:   Patient ID:  Maria Washington is a 84 y.o. (DOB Jun 19, 1929) female.  Chief Complaint: No chief complaint on file.   HPI Maria Washington presents to the office today with a compliant of weakness, pain in right arm, fatigue, shortness of breath  Medications: I have reviewed the patient's current medications.  Allergies:  Allergies  Allergen Reactions  . Clindamycin/Lincomycin Rash    . Penicillins Rash and Other (See Comments)    Has patient had a PCN reaction causing immediate rash, facial/tongue/throat swelling, SOB or lightheadedness with hypotension: No Has patient had a PCN reaction causing severe rash involving mucus membranes or skin necrosis: No Has patient had a PCN reaction that required hospitalization: No Has patient had a PCN reaction occurring within the last 10 years: No If all of the above answers are "NO", then may proceed with Cephalosporin use.    Past Medical History:  Diagnosis Date  . Aortic atherosclerosis ([place]) 02/10/2017  . Arthritis   . CAD (coronary artery disease), native coronary artery    3 vessel calcification noted on CT scan   . Cardiac pacemaker in situ 12/05/2011  . Chronic diastolic congestive heart failure ([place])   . COPD (chronic obstructive pulmonary disease) (HCC)    spot on lung  . Dehydration 06/04/2017  . Encounter for antineoplastic chemotherapy 03/04/2017  . Goals of care, counseling/discussion 03/17/2017  . History of breast cancer    Treated with lumpectomy, radiation, and chemo sees Dr. Ralene Ok   . Hypertensive heart disease without CHF   . Hypokalemia 04/18/2017  . Hypothyroid   . Mitral regurgitation   . Paroxysmal atrial fibrillation (HCC)    CHA2DS2VASC score 5  . Personal history of chemotherapy   . Personal history of radiation therapy   . Second degree heart block   . Stage IV squamous cell carcinoma of right lung ([place]) 03/04/2017    Past Surgical  History:  Procedure Laterality Date  . ABDOMINAL HYSTERECTOMY    . BLADDER SUSPENSION    . BREAST LUMPECTOMY Left    lumpectomy  . CARPAL TUNNEL RELEASE     left wrist  . CHOLECYSTECTOMY    . FINGER SURGERY    . IR FLUORO GUIDE PORT INSERTION RIGHT  03/12/2017  . IR US GUIDE VASC ACCESS RIGHT  03/12/2017  . PERMANENT PACEMAKER INSERTION N/A 12/04/2011   Procedure: PERMANENT PACEMAKER INSERTION;  Surgeon: Evans Lance, MD;  Location: [facility] CATH LAB;  Service:  Cardiovascular;  Laterality: N/A;  . SHOULDER SURGERY    . TONSILLECTOMY AND ADENOIDECTOMY      Family History  Problem Relation Age of Onset  . Heart attack Mother   . Cancer Brother        kidney  . Cancer Daughter        lung  . Breast cancer Neg Hx     Social History   Socioeconomic History  . Marital status: Widowed    Spouse name: Not on file  . Number of children: 6  . Years of education: 48  . Highest education level: Not on file  Occupational History  . Not on file  Social Needs  . Financial resource strain: Not on file  . Food insecurity:    Worry: Not on file    Inability: Not on file  . Transportation needs:    Medical: Not on file    Non-medical: Not on file  Tobacco Use  . Smoking status: Former Smoker    Packs/day: 1.00    Years: 60.00    Pack years: 60.00    Types: Cigarettes    Last attempt to quit: 06/07/2013    Years since quitting: 5.2  . Smokeless tobacco: Never Used  Substance and Sexual Activity  . Alcohol use: No  . Drug use: No  . Sexual activity: Not Currently  Lifestyle  . Physical activity:    Days per week: Not on file    Minutes per session: Not on file  . Stress: Not on file  Relationships  . Social connections:    Talks on phone: Not on file    Gets together: Not on file    Attends religious service: Not on file    Active member of club or organization: Not on file    Attends meetings of clubs or organizations: Not on file    Relationship status: Not on file  . Intimate partner violence:    Fear of current or ex partner: Not on file    Emotionally abused: Not on file    Physically abused: Not on file    Forced sexual activity: Not on file  Other Topics Concern  . Not on file  Social History Narrative   Widow.  Has 22 dogs.   Right handed    Caffeine use: Coffee- 2 cups per day or less   Decaf Mtn Dew   Lives with daughter    Past Medical History, Surgical history, Social history, and Family history were reviewed  and updated as appropriate.   Please see review of systems for further details on the patient's review from today.   Review of Systems:  Review of Systems  Constitutional: Positive for fatigue.  Respiratory: Positive for cough and shortness of breath.     Objective:   Physical Exam:  BP (!) 130/56 (BP Location: Right Arm, Patient Position: Sitting) Comment: Monica RN is aware  Pulse 74  Temp 99.2 F (37.3 C) (Oral)   Resp (!) 21   Ht 5' (1.524 m)   Wt 143 lb 14.4 oz (65.3 kg)   SpO2 90%   BMI 28.10 kg/m  ECOG: 3  Physical Exam  Constitutional: She is oriented to person, place, and time. She appears well-developed and well-nourished.  HENT:  Head: Normocephalic.  Eyes: EOM are normal.  Neck: Normal range of motion. Neck supple.  Cardiovascular: Regular rhythm.  Pulmonary/Chest: She has rales.  Productive cough;sputum thick and yellow  Abdominal: Soft.  Musculoskeletal: Normal range of motion.  Neurological: She is alert and oriented to person, place, and time.  Skin: Skin is dry. There is pallor.    Lab Review:     Component Value Date/Time   NA 136 08/18/2018 1256   NA 141 11/03/2017 0834   K 5.3 (H) 08/18/2018 1256   K 3.9 11/03/2017 0834   CL 102 08/18/2018 1256   CO2 24 08/18/2018 1256   CO2 23 11/03/2017 0834   GLUCOSE 271 (H) 08/18/2018 1256   GLUCOSE 110 11/03/2017 0834   BUN 22 08/18/2018 1256   BUN 10.1 11/03/2017 0834   CREATININE 1.07 (H) 08/18/2018 1256   CREATININE 0.8 11/03/2017 0834   CALCIUM 9.0 08/18/2018 1256   CALCIUM 9.6 11/03/2017 0834   PROT 6.0 (L) 08/18/2018 1256   PROT 6.8 11/03/2017 0834   ALBUMIN 2.2 (L) 08/18/2018 1256   ALBUMIN 2.7 (L) 11/03/2017 0834   AST 11 (L) 08/18/2018 1256   AST 13 11/03/2017 0834   ALT <6 08/18/2018 1256   ALT 11 11/03/2017 0834   ALKPHOS 67 08/18/2018 1256   ALKPHOS 61 11/03/2017 0834   BILITOT 0.5 08/18/2018 1256   BILITOT 0.31 11/03/2017 0834   GFRNONAA 45 (L) 08/18/2018 1256   GFRAA 53 (L)  08/18/2018 1256       Component Value Date/Time   WBC 14.1 (H) 08/18/2018 1256   WBC 6.9 02/02/2018 1116   RBC 2.17 (L) 08/18/2018 1256   HGB 7.3 (L) 08/18/2018 1256   HGB 10.0 (L) 11/03/2017 0834   HCT 24.2 (L) 08/18/2018 1256   HCT 31.6 (L) 11/03/2017 0834   PLT 210 08/18/2018 1256   PLT 207 11/03/2017 0834   MCV 111.5 (H) 08/18/2018 1256   MCV 93.5 11/03/2017 0834   MCH 33.6 08/18/2018 1256   MCHC 30.2 (L) 08/18/2018 1256   RDW 22.9 (H) 08/18/2018 1256   RDW 16.5 (H) 11/03/2017 0834   LYMPHSABS 0.6 (L) 08/18/2018 1256   LYMPHSABS 1.0 11/03/2017 0834   MONOABS 1.3 (H) 08/18/2018 1256   MONOABS 0.7 11/03/2017 0834   EOSABS 0.0 08/18/2018 1256   EOSABS 0.1 11/03/2017 0834   BASOSABS 0.0 08/18/2018 1256   BASOSABS 0.0 11/03/2017 0834   -------------------------------  Imaging from last 24 hours (if applicable):  Radiology interpretation: No results found.

## 2018-08-19 ENCOUNTER — Other Ambulatory Visit: Payer: Self-pay | Admitting: *Deleted

## 2018-08-19 ENCOUNTER — Inpatient Hospital Stay: Payer: Medicare Other

## 2018-08-19 ENCOUNTER — Other Ambulatory Visit: Payer: Self-pay | Admitting: Oncology

## 2018-08-19 DIAGNOSIS — T451X5A Adverse effect of antineoplastic and immunosuppressive drugs, initial encounter: Principal | ICD-10-CM

## 2018-08-19 DIAGNOSIS — D6481 Anemia due to antineoplastic chemotherapy: Secondary | ICD-10-CM

## 2018-08-19 DIAGNOSIS — I5032 Chronic diastolic (congestive) heart failure: Secondary | ICD-10-CM

## 2018-08-19 DIAGNOSIS — C3491 Malignant neoplasm of unspecified part of right bronchus or lung: Secondary | ICD-10-CM

## 2018-08-19 LAB — PREPARE RBC (CROSSMATCH)

## 2018-08-19 MED ORDER — HEPARIN SOD (PORK) LOCK FLUSH 100 UNIT/ML IV SOLN
500.0000 [IU] | Freq: Every day | INTRAVENOUS | Status: AC | PRN
  Administered 2018-08-19: 500 [IU]
  Filled 2018-08-19: qty 5

## 2018-08-19 MED ORDER — SODIUM CHLORIDE 0.9% IV SOLUTION
250.0000 mL | Freq: Once | INTRAVENOUS | Status: AC
  Administered 2018-08-19: 250 mL via INTRAVENOUS
  Filled 2018-08-19: qty 250

## 2018-08-19 MED ORDER — SODIUM CHLORIDE 0.9% FLUSH
10.0000 mL | INTRAVENOUS | Status: AC | PRN
  Administered 2018-08-19: 10 mL
  Filled 2018-08-19: qty 10

## 2018-08-19 MED ORDER — FUROSEMIDE 10 MG/ML IJ SOLN
20.0000 mg | Freq: Once | INTRAMUSCULAR | Status: AC
  Administered 2018-08-19: 20 mg via INTRAVENOUS

## 2018-08-19 MED ORDER — FUROSEMIDE 10 MG/ML IJ SOLN: 20.0000 mg | Freq: Once | INTRAMUSCULAR | Status: DC

## 2018-08-19 MED ORDER — FUROSEMIDE 10 MG/ML IJ SOLN
INTRAMUSCULAR | Status: AC
  Filled 2018-08-19: qty 2

## 2018-08-19 MED ORDER — ACETAMINOPHEN 325 MG PO TABS
ORAL_TABLET | ORAL | Status: AC
  Filled 2018-08-19: qty 2

## 2018-08-19 MED ORDER — DIPHENHYDRAMINE HCL 25 MG PO CAPS
25.0000 mg | ORAL_CAPSULE | Freq: Once | ORAL | Status: AC
  Administered 2018-08-19: 25 mg via ORAL

## 2018-08-19 MED ORDER — DIPHENHYDRAMINE HCL 25 MG PO CAPS
ORAL_CAPSULE | ORAL | Status: AC
  Filled 2018-08-19: qty 1

## 2018-08-19 MED ORDER — ACETAMINOPHEN 325 MG PO TABS
650.0000 mg | ORAL_TABLET | Freq: Once | ORAL | Status: AC
  Administered 2018-08-19: 650 mg via ORAL

## 2018-08-19 NOTE — Progress Notes (Signed)
Upon pt arrival for 2U PRBCs, noted increased SOB and wheezing. Discussed change with pt and daughter. Pt said, "It just started recently." Pt daughter said, "It has been progressing for some time now." Additionally noted productive cough. Pt daughter inquired about chest scan result. Spoke with Dr. Julien Nordmann regarding scan and found that it was a chest xray completed yesterday by Burns Spain, NP. No appearance of infection noted. Intent yesterday was to r/o pneumonia. Encouraged pt to discuss with PCP. Pt daughter verbalized appt scheduled tomorrow (08/20/18). Additional concern from daughter regarding 271 blood sugar and whether she should continue the abx she was given yesterday. Discussed with Mikey Bussing, NP who advised that pt should f/u with PCP as planned and bring up concerns regarding breathing and blood sugar. Abx to be continued in the event that SOB is being caused by COPD exacerbation. Auscultated pt 's lungs and noted crackles in the upper and lower lobes bilaterally. Pt daughter verbalized that lasix 40mg  PO was given this morning. Pt takes this daily. Shared assessment findings along with pt BP. 20mg  IV lasix added by Mikey Bussing, NP to be given in-between units of blood. Hand-off given to Ruben Im, RN. Additional findings noted were 3lb weight gain from last night to this morning. Pt has not been eating or drinking much. Minimal activity to invoke SOB. Continued discussion with Learta Codding, RN and Erasmo Downer, NP. Plan moving forward to proceed with blood after lasix IV given. To continue monitoring pt in the event that she may need to go the ED after or during her transfusion. Pt daughter advised to call PCP, or preferably cardiologist to see them tomorrow if possible. Should the pt's symptoms worsen, they should go to the ED tonight. Pt daughter verbalized understanding. Before leaving shift, pt standing because she was, "getting sore from sitting." Continuing to have productive cough with moderate  thick yellow sputum. Pt more alert and interactive after completion of first unit of blood.

## 2018-08-19 NOTE — Patient Instructions (Signed)

## 2018-08-19 NOTE — Progress Notes (Signed)
Patient able to tolerate small amount of soup during end of transfusion.  VS remain stable.  Patient continues with cough.  Patient stated, "I actually feel better."  Daughter declined any additional interventions and will follow up with PCP tomorrow.

## 2018-08-20 ENCOUNTER — Telehealth: Payer: Self-pay | Admitting: Nurse Practitioner

## 2018-08-20 LAB — TYPE AND SCREEN
ABO/RH(D): A NEG
Antibody Screen: NEGATIVE
UNIT DIVISION: 0
UNIT DIVISION: 0

## 2018-08-20 LAB — BPAM RBC
Blood Product Expiration Date: 201910012359
Blood Product Expiration Date: 201910042359
ISSUE DATE / TIME: 201909111359
ISSUE DATE / TIME: 201909111359
UNIT TYPE AND RH: 600
Unit Type and Rh: 600

## 2018-08-20 NOTE — Telephone Encounter (Signed)
Opened in error

## 2018-08-21 ENCOUNTER — Telehealth: Payer: Self-pay | Admitting: *Deleted

## 2018-08-21 ENCOUNTER — Other Ambulatory Visit: Payer: Self-pay | Admitting: Oncology

## 2018-08-21 DIAGNOSIS — C3491 Malignant neoplasm of unspecified part of right bronchus or lung: Secondary | ICD-10-CM

## 2018-08-21 MED ORDER — OXYCODONE HCL 5 MG PO TABS: 5.0000 mg | ORAL_TABLET | ORAL | 0 refills | Status: AC | PRN

## 2018-08-21 NOTE — Telephone Encounter (Signed)
TC from family member. Pt needs a refill on her Oxycodone IR 5mg . Pt just has a few left. Last filled 08/04/18 for 30 tabs

## 2018-08-24 ENCOUNTER — Telehealth: Payer: Self-pay | Admitting: Medical Oncology

## 2018-08-24 ENCOUNTER — Telehealth: Payer: Self-pay | Admitting: Internal Medicine

## 2018-08-24 ENCOUNTER — Other Ambulatory Visit: Payer: Self-pay | Admitting: Internal Medicine

## 2018-08-24 DIAGNOSIS — C3491 Malignant neoplasm of unspecified part of right bronchus or lung: Secondary | ICD-10-CM

## 2018-08-24 MED ORDER — FENTANYL 25 MCG/HR TD PT72: 25.0000 ug | MEDICATED_PATCH | TRANSDERMAL | 0 refills | Status: AC

## 2018-08-24 NOTE — Telephone Encounter (Signed)
Duragesic refill needed. She has 1 patch left.

## 2018-08-24 NOTE — Telephone Encounter (Signed)
Dtr stated pt refuses further chemo and requests HOSPICE. Referral made per [facility].

## 2018-08-24 NOTE — Telephone Encounter (Signed)
Spoke to pts daughter regarding upcoming appts per 9/10 sch message.  Pt asked to be forwarded to the nurse to discuss palliative care.

## 2018-08-26 ENCOUNTER — Inpatient Hospital Stay: Payer: Medicare Other

## 2018-08-27 ENCOUNTER — Encounter: Payer: Self-pay | Admitting: *Deleted

## 2018-08-28 ENCOUNTER — Telehealth: Payer: Self-pay

## 2018-08-28 NOTE — Telephone Encounter (Signed)
Faxed signed orders for [facility] back to [place].

## 2018-08-31 ENCOUNTER — Other Ambulatory Visit: Payer: Medicare Other

## 2018-08-31 ENCOUNTER — Ambulatory Visit: Payer: Medicare Other

## 2018-08-31 ENCOUNTER — Ambulatory Visit: Payer: Medicare Other | Admitting: Oncology

## 2018-09-07 ENCOUNTER — Other Ambulatory Visit: Payer: Medicare Other

## 2018-09-07 ENCOUNTER — Ambulatory Visit: Payer: Medicare Other

## 2018-09-08 DEATH — deceased

## 2018-09-09 ENCOUNTER — Encounter: Payer: Medicare Other | Admitting: *Deleted

## 2019-03-30 ENCOUNTER — Encounter: Payer: Self-pay | Admitting: Internal Medicine
